# Patient Record
Sex: Female | Born: 1950 | Race: Black or African American | Hispanic: No | Marital: Married | Smoking: Former smoker
Health system: Southern US, Community
[De-identification: ages and names within clinical notes are randomized; demographics above are authoritative.]

## PROBLEM LIST (undated history)

## (undated) DIAGNOSIS — L4050 Arthropathic psoriasis, unspecified: Secondary | ICD-10-CM

## (undated) DIAGNOSIS — C801 Malignant (primary) neoplasm, unspecified: Secondary | ICD-10-CM

## (undated) DIAGNOSIS — C50919 Malignant neoplasm of unspecified site of unspecified female breast: Secondary | ICD-10-CM

## (undated) DIAGNOSIS — B354 Tinea corporis: Secondary | ICD-10-CM

## (undated) DIAGNOSIS — Z973 Presence of spectacles and contact lenses: Secondary | ICD-10-CM

## (undated) DIAGNOSIS — IMO0002 Reserved for concepts with insufficient information to code with codable children: Secondary | ICD-10-CM

## (undated) DIAGNOSIS — R232 Flushing: Secondary | ICD-10-CM

## (undated) DIAGNOSIS — E119 Type 2 diabetes mellitus without complications: Secondary | ICD-10-CM

## (undated) DIAGNOSIS — Z972 Presence of dental prosthetic device (complete) (partial): Secondary | ICD-10-CM

## (undated) DIAGNOSIS — IMO0001 Reserved for inherently not codable concepts without codable children: Secondary | ICD-10-CM

## (undated) DIAGNOSIS — R251 Tremor, unspecified: Secondary | ICD-10-CM

## (undated) HISTORY — DX: Flushing: R23.2

## (undated) HISTORY — DX: Reserved for concepts with insufficient information to code with codable children: IMO0002

## (undated) HISTORY — DX: Tremor, unspecified: R25.1

## (undated) HISTORY — DX: Malignant (primary) neoplasm, unspecified: C80.1

## (undated) HISTORY — DX: Reserved for inherently not codable concepts without codable children: IMO0001

## (undated) HISTORY — PX: COLONOSCOPY: SHX174

## (undated) HISTORY — DX: Malignant neoplasm of unspecified site of unspecified female breast: C50.919

## (undated) HISTORY — DX: Type 2 diabetes mellitus without complications: E11.9

## (undated) HISTORY — DX: Tinea corporis: B35.4

## (undated) HISTORY — DX: Arthropathic psoriasis, unspecified: L40.50

---

## 1999-02-26 ENCOUNTER — Other Ambulatory Visit: Admission: RE | Admit: 1999-02-26 | Discharge: 1999-02-26 | Payer: Self-pay | Admitting: Obstetrics and Gynecology

## 1999-11-12 ENCOUNTER — Other Ambulatory Visit: Admission: RE | Admit: 1999-11-12 | Discharge: 1999-11-12 | Payer: Self-pay | Admitting: Obstetrics and Gynecology

## 2000-12-16 ENCOUNTER — Encounter: Admission: RE | Admit: 2000-12-16 | Discharge: 2000-12-16 | Payer: Self-pay | Admitting: General Practice

## 2000-12-16 ENCOUNTER — Encounter: Payer: Self-pay | Admitting: General Practice

## 2000-12-21 ENCOUNTER — Emergency Department (HOSPITAL_COMMUNITY): Admission: EM | Admit: 2000-12-21 | Discharge: 2000-12-21 | Payer: Self-pay | Admitting: Emergency Medicine

## 2000-12-21 ENCOUNTER — Encounter: Payer: Self-pay | Admitting: *Deleted

## 2002-06-08 ENCOUNTER — Other Ambulatory Visit: Admission: RE | Admit: 2002-06-08 | Discharge: 2002-06-08 | Payer: Self-pay | Admitting: *Deleted

## 2005-06-26 ENCOUNTER — Encounter (INDEPENDENT_AMBULATORY_CARE_PROVIDER_SITE_OTHER): Payer: Self-pay | Admitting: *Deleted

## 2005-06-26 ENCOUNTER — Ambulatory Visit (HOSPITAL_COMMUNITY): Admission: RE | Admit: 2005-06-26 | Discharge: 2005-06-26 | Payer: Self-pay | Admitting: Gastroenterology

## 2005-06-27 ENCOUNTER — Encounter: Admission: RE | Admit: 2005-06-27 | Discharge: 2005-06-27 | Payer: Self-pay | Admitting: Family Medicine

## 2007-07-06 ENCOUNTER — Emergency Department (HOSPITAL_COMMUNITY): Admission: EM | Admit: 2007-07-06 | Discharge: 2007-07-06 | Payer: Self-pay | Admitting: *Deleted

## 2009-04-02 ENCOUNTER — Encounter: Admission: RE | Admit: 2009-04-02 | Discharge: 2009-04-02 | Payer: Self-pay | Admitting: Emergency Medicine

## 2009-04-16 ENCOUNTER — Ambulatory Visit: Payer: Self-pay | Admitting: Obstetrics and Gynecology

## 2009-04-23 ENCOUNTER — Ambulatory Visit: Payer: Self-pay | Admitting: Obstetrics and Gynecology

## 2009-05-05 ENCOUNTER — Emergency Department (HOSPITAL_COMMUNITY): Admission: EM | Admit: 2009-05-05 | Discharge: 2009-05-05 | Payer: Self-pay | Admitting: Emergency Medicine

## 2009-05-17 ENCOUNTER — Ambulatory Visit: Payer: Self-pay | Admitting: *Deleted

## 2009-07-14 ENCOUNTER — Emergency Department (HOSPITAL_COMMUNITY): Admission: EM | Admit: 2009-07-14 | Discharge: 2009-07-14 | Payer: Self-pay | Admitting: Emergency Medicine

## 2010-05-29 ENCOUNTER — Ambulatory Visit: Payer: Self-pay | Admitting: Obstetrics and Gynecology

## 2011-03-16 LAB — BASIC METABOLIC PANEL
BUN: 13 mg/dL (ref 6–23)
CO2: 24 mEq/L (ref 19–32)
Calcium: 9.3 mg/dL (ref 8.4–10.5)
Chloride: 105 mEq/L (ref 96–112)
Creatinine, Ser: 0.59 mg/dL (ref 0.4–1.2)
GFR calc Af Amer: >60 mL/min (ref >60)
Glucose, Bld: 327 mg/dL — ABNORMAL HIGH (ref 70–99)

## 2011-03-18 LAB — BASIC METABOLIC PANEL
BUN: 10 mg/dL (ref 6–23)
CO2: 26 mEq/L (ref 19–32)
Chloride: 108 mEq/L (ref 96–112)
Creatinine, Ser: 0.54 mg/dL (ref 0.4–1.2)
Potassium: 3.7 mEq/L (ref 3.5–5.1)

## 2011-03-18 LAB — BASIC METABOLIC PANEL WITH GFR
Calcium: 9.2 mg/dL (ref 8.4–10.5)
GFR calc Af Amer: >60 mL/min (ref >60)
GFR calc non Af Amer: >60 mL/min (ref >60)
Glucose, Bld: 268 mg/dL — ABNORMAL HIGH (ref 70–99)
Sodium: 141 meq/L (ref 135–145)

## 2011-09-22 LAB — CBC
HCT: 39.5
Hemoglobin: 12.9
MCHC: 32.8
Platelets: 263
RDW: 12.7

## 2011-09-22 LAB — I-STAT 8, (EC8 V) (CONVERTED LAB)
Acid-Base Excess: 2
Bicarbonate: 26.8 — ABNORMAL HIGH
Glucose, Bld: 289 — ABNORMAL HIGH
Hemoglobin: 14.6
Potassium: 4
Sodium: 138
TCO2: 28
pH, Ven: 7.416 — ABNORMAL HIGH

## 2011-09-22 LAB — DIFFERENTIAL
Basophils Absolute: 0.1
Basophils Relative: 1
Eosinophils Relative: 1
Lymphocytes Relative: 25
Monocytes Absolute: 0.5

## 2011-09-22 LAB — POCT CARDIAC MARKERS: Troponin i, poc: <0.05

## 2011-09-22 LAB — POCT I-STAT CREATININE
Creatinine, Ser: 0.7
Operator id: 285491

## 2011-09-22 LAB — D-DIMER, QUANTITATIVE: D-Dimer, Quant: 1.32 — ABNORMAL HIGH

## 2011-10-24 ENCOUNTER — Other Ambulatory Visit: Payer: Self-pay | Admitting: Orthopedic Surgery

## 2011-10-29 ENCOUNTER — Encounter (HOSPITAL_BASED_OUTPATIENT_CLINIC_OR_DEPARTMENT_OTHER): Admission: RE | Payer: Self-pay | Source: Ambulatory Visit

## 2011-10-29 ENCOUNTER — Ambulatory Visit (HOSPITAL_BASED_OUTPATIENT_CLINIC_OR_DEPARTMENT_OTHER)
Admission: RE | Admit: 2011-10-29 | Payer: BC Managed Care – PPO | Source: Ambulatory Visit | Admitting: Orthopedic Surgery

## 2011-10-29 SURGERY — TENDON REPAIR
Anesthesia: Choice | Laterality: Right

## 2014-01-23 ENCOUNTER — Other Ambulatory Visit: Payer: Self-pay

## 2014-01-23 DIAGNOSIS — Z1231 Encounter for screening mammogram for malignant neoplasm of breast: Secondary | ICD-10-CM

## 2014-02-03 ENCOUNTER — Ambulatory Visit: Payer: BC Managed Care – PPO

## 2014-02-03 ENCOUNTER — Other Ambulatory Visit: Payer: Self-pay | Admitting: Family Medicine

## 2014-02-03 DIAGNOSIS — N63 Unspecified lump in unspecified breast: Secondary | ICD-10-CM

## 2014-02-22 ENCOUNTER — Other Ambulatory Visit: Payer: Self-pay | Admitting: Family Medicine

## 2014-02-22 ENCOUNTER — Ambulatory Visit
Admission: RE | Admit: 2014-02-22 | Discharge: 2014-02-22 | Disposition: A | Discharge status: Home / Self Care | Payer: BC Managed Care – PPO | Source: Ambulatory Visit | Attending: Family Medicine | Admitting: Family Medicine

## 2014-02-22 DIAGNOSIS — N6310 Unspecified lump in the right breast, unspecified quadrant: Secondary | ICD-10-CM

## 2014-02-22 DIAGNOSIS — N63 Unspecified lump in unspecified breast: Secondary | ICD-10-CM

## 2014-02-28 ENCOUNTER — Other Ambulatory Visit: Payer: Self-pay | Admitting: Family Medicine

## 2014-02-28 DIAGNOSIS — D0510 Intraductal carcinoma in situ of unspecified breast: Secondary | ICD-10-CM

## 2014-03-01 ENCOUNTER — Telehealth: Payer: Self-pay | Admitting: *Deleted

## 2014-03-01 NOTE — Telephone Encounter (Signed)
Left vm for pt to return call to schedule appt for [facility] on 03/08/14.

## 2014-03-03 ENCOUNTER — Ambulatory Visit
Admission: RE | Admit: 2014-03-03 | Discharge: 2014-03-03 | Disposition: A | Discharge status: Home / Self Care | Payer: BC Managed Care – PPO | Source: Ambulatory Visit | Attending: Family Medicine | Admitting: Family Medicine

## 2014-03-03 ENCOUNTER — Telehealth: Payer: Self-pay | Admitting: *Deleted

## 2014-03-03 DIAGNOSIS — C50411 Malignant neoplasm of upper-outer quadrant of right female breast: Secondary | ICD-10-CM | POA: Insufficient documentation

## 2014-03-03 DIAGNOSIS — D0510 Intraductal carcinoma in situ of unspecified breast: Secondary | ICD-10-CM

## 2014-03-03 HISTORY — DX: Malignant neoplasm of upper-outer quadrant of right female breast: C50.411

## 2014-03-03 MED ORDER — GADOBENATE DIMEGLUMINE 529 MG/ML IV SOLN
18.0000 mL | Freq: Once | INTRAVENOUS | Status: AC | PRN
  Administered 2014-03-03: 18 mL via INTRAVENOUS

## 2014-03-03 NOTE — Telephone Encounter (Signed)
Confirmed [facility] for 03/08/14 at 1200 .  Instructions and contact information given.

## 2014-03-08 ENCOUNTER — Ambulatory Visit (HOSPITAL_BASED_OUTPATIENT_CLINIC_OR_DEPARTMENT_OTHER): Payer: BC Managed Care – PPO | Admitting: Surgery

## 2014-03-08 ENCOUNTER — Encounter: Payer: Self-pay | Admitting: Oncology

## 2014-03-08 ENCOUNTER — Other Ambulatory Visit (HOSPITAL_BASED_OUTPATIENT_CLINIC_OR_DEPARTMENT_OTHER): Payer: BC Managed Care – PPO

## 2014-03-08 ENCOUNTER — Ambulatory Visit: Payer: BC Managed Care – PPO | Attending: Surgery | Admitting: Physical Therapy

## 2014-03-08 ENCOUNTER — Ambulatory Visit: Payer: BC Managed Care – PPO

## 2014-03-08 ENCOUNTER — Encounter (INDEPENDENT_AMBULATORY_CARE_PROVIDER_SITE_OTHER): Payer: Self-pay | Admitting: Surgery

## 2014-03-08 ENCOUNTER — Ambulatory Visit (HOSPITAL_BASED_OUTPATIENT_CLINIC_OR_DEPARTMENT_OTHER): Payer: BC Managed Care – PPO | Admitting: Oncology

## 2014-03-08 ENCOUNTER — Ambulatory Visit
Admission: RE | Admit: 2014-03-08 | Discharge: 2014-03-08 | Disposition: A | Discharge status: Home / Self Care | Payer: BC Managed Care – PPO | Source: Ambulatory Visit | Attending: Radiation Oncology | Admitting: Radiation Oncology

## 2014-03-08 VITALS — BP 143/81 | HR 81 | Temp 98.5°F | Resp 20 | Ht 67.0 in | Wt 205.3 lb

## 2014-03-08 DIAGNOSIS — C50411 Malignant neoplasm of upper-outer quadrant of right female breast: Secondary | ICD-10-CM

## 2014-03-08 DIAGNOSIS — C50919 Malignant neoplasm of unspecified site of unspecified female breast: Secondary | ICD-10-CM | POA: Insufficient documentation

## 2014-03-08 DIAGNOSIS — C50419 Malignant neoplasm of upper-outer quadrant of unspecified female breast: Secondary | ICD-10-CM

## 2014-03-08 DIAGNOSIS — IMO0001 Reserved for inherently not codable concepts without codable children: Secondary | ICD-10-CM | POA: Insufficient documentation

## 2014-03-08 DIAGNOSIS — C50911 Malignant neoplasm of unspecified site of right female breast: Secondary | ICD-10-CM | POA: Insufficient documentation

## 2014-03-08 DIAGNOSIS — R293 Abnormal posture: Secondary | ICD-10-CM | POA: Insufficient documentation

## 2014-03-08 DIAGNOSIS — Z170 Estrogen receptor positive status [ER+]: Secondary | ICD-10-CM

## 2014-03-08 LAB — CBC WITH DIFFERENTIAL/PLATELET
BASO%: 0.6 % (ref 0.0–2.0)
BASOS ABS: 0 10*3/uL (ref 0.0–0.1)
EOS ABS: 0 10*3/uL (ref 0.0–0.5)
EOS%: 0.1 % (ref 0.0–7.0)
HCT: 38.1 % (ref 34.8–46.6)
HEMOGLOBIN: 12.1 g/dL (ref 11.6–15.9)
LYMPH%: 22.5 % (ref 14.0–49.7)
MCH: 26 pg (ref 25.1–34.0)
MCHC: 31.8 g/dL (ref 31.5–36.0)
MCV: 81.6 fL (ref 79.5–101.0)
MONO#: 0.3 10*3/uL (ref 0.1–0.9)
MONO%: 5.3 % (ref 0.0–14.0)
NEUT#: 4.7 10*3/uL (ref 1.5–6.5)
NEUT%: 71.5 % (ref 38.4–76.8)
PLATELETS: 248 10*3/uL (ref 145–400)
RBC: 4.67 10*6/uL (ref 3.70–5.45)
RDW: 14.3 % (ref 11.2–14.5)
WBC: 6.5 10*3/uL (ref 3.9–10.3)
lymph#: 1.5 10*3/uL (ref 0.9–3.3)

## 2014-03-08 LAB — COMPREHENSIVE METABOLIC PANEL (CC13)
ALBUMIN: 4 g/dL (ref 3.5–5.0)
ALT: 18 U/L (ref 0–55)
ANION GAP: 10 meq/L (ref 3–11)
AST: 19 U/L (ref 5–34)
Alkaline Phosphatase: 71 U/L (ref 40–150)
BUN: 16.2 mg/dL (ref 7.0–26.0)
CALCIUM: 9.5 mg/dL (ref 8.4–10.4)
CO2: 25 meq/L (ref 22–29)
Chloride: 109 mEq/L (ref 98–109)
Creatinine: 0.7 mg/dL (ref 0.6–1.1)
Glucose: 177 mg/dl — ABNORMAL HIGH (ref 70–140)
POTASSIUM: 4.1 meq/L (ref 3.5–5.1)
SODIUM: 144 meq/L (ref 136–145)
TOTAL PROTEIN: 7.6 g/dL (ref 6.4–8.3)
Total Bilirubin: 0.22 mg/dL (ref 0.20–1.20)

## 2014-03-08 NOTE — Progress Notes (Signed)
Pt uses [facility] on [address]

## 2014-03-08 NOTE — Patient Instructions (Signed)
Sentinel Lymph Node Biopsy Sentinel lymph node biopsy is a procedure in which a single lymph node is identified, removed, and examined for cancer. Lymph nodes are collections of tissue that help filter infections, cancer cells, and other waste substances from the bloodstream. Certain types of cancer can spread to nearby lymph nodes. The cancer spreads to one lymph node first, and then to others. The first lymph node that your cancer could spread to is called the sentinel lymph node. Examining the sentinel lymph node for cancer can help your caregiver plan future treatment for you. LET YOUR CAREGIVER KNOW ABOUT:   Allergies to food or medicine.  Medicines taken, including vitamins, herbs, eyedrops, over-the-counter medicines, and creams.  Use of steroids (by mouth or creams).  Previous problems with numbing medicines.  History of bleeding problems or blood clots.  Previous surgery.  Other health problems, including diabetes and kidney problems.  Possibility of pregnancy, if this applies. RISKS AND COMPLICATIONS   Infection.  Bleeding.  Allergic reaction to the dye used for the procedure.  Blue staining of the skin where the dye is injected.  Damaged lymph vessels, causing a buildup of fluid (lymphedema).  Pain or bruising at the biopsy site. BEFORE THE PROCEDURE   Stop smoking at least 2 weeks before the procedure. Not smoking will improve your health after the procedure and decrease the chance of getting a wound infection.  You may have blood tests to make sure your blood clots normally.  Ask your caregiver about changing or stopping your regular medicines.  Do not eat or drink anything for 8 hours before the procedure. PROCEDURE   You will be given medicine that makes you sleep (general anesthetic).  A blue, radioactive dye will be injected near the tumor. The dye will then spread into the sentinel lymph node.  A scanner will identify the sentinel lymph node.  A  small cut (incision) will be made, and the sentinel lymph node will be removed.  The sentinel lymph node will be examined in a lab. Sometimes, a sentinel lymph node biopsy is performed during another surgery, such as a mastectomy or lumpectomy for breast cancer.  AFTER THE PROCEDURE   You will go to a recovery room.  You will be monitored for several hours.  If complications do not occur, you will be allowed to go home a few hours after the procedure.  Your urine may be blue for the next 24 hours. This is normal. It is caused by the dye used during the procedure.  Your skin where the dye was injected may be blue for up to 8 weeks. Document Released: 02/16/2012 Document Reviewed: 02/16/2012 [facility] Patient Information 2014 [place]. Implanted Port Insertion An implanted port is a central line that has a round shape and is placed under the skin. It is used as a long-term IV access for:   Medicines, such as chemotherapy.   Fluids.   Liquid nutrition, such as total parenteral nutrition (TPN).   Blood samples.  LET [facility] CARE PROVIDER KNOW ABOUT:  Allergies to food or medicine.   Medicines taken, including vitamins, herbs, eye drops, creams, and over-the-counter medicines.   Any allergies to heparin.  Use of steroids (by mouth or creams).   Previous problems with anesthetics or numbing medicines.   History of bleeding problems or blood clots.   Previous surgery.   Other health problems, including diabetes and kidney problems.   Possibility of pregnancy, if this applies. RISKS AND COMPLICATIONS  Damage  to the blood vessel, bruising, or bleeding at the puncture site.   Infection.  Blood clot in the vessel that the port is in.  Breakdown of the skin over your port.  Very rarely a person may develop a condition called a pneumothorax, a collection of air in the chest that may cause one of the lungs to collapse. The placement of these catheters  with the appropriate imaging guidance significantly decreases the risk of a pneumothorax.  BEFORE THE PROCEDURE   Your health care provider may want you to have blood tests. These tests can help tell how well your kidneys and liver are working. They can also show how well your blood clots.   If you take blood thinners (anticoagulant medicines), ask your health care provider when you should stop taking them.   Make arrangements for someone to drive you home. This is necessary if you have been sedated for your procedure.  PROCEDURE  Port insertion usually takes about 30 45 minutes.   An IV needle will be inserted in your arm. Medicine for pain and medicine to help relax you (sedative) will flow directly into your body through this needle.   You will lie on an exam table, and you will be connected to monitors to keep track of your heart rate, blood pressure, and breathing throughout the procedure.  An oxygen monitoring device may be attached to your finger. Oxygen will be given.   Everything is kept as germ free (sterile) as possible during the procedure. The skin near the point of the incision will be cleaned with antiseptic, and the area will be draped with sterile towels. The skin and deeper tissues over the port area will be made numb with a local anesthetic.  Two small cuts (incisions) will be made in the skin to insert the port. One will be made in the neck to obtain access to the vein where the catheter will lie.   Because the port reservoir is placed under the skin, a small skin incision is made in the upper chest, and a small pocket for the port is made under the skin. The catheter connected to the port tunnels to a large central vein in the chest. A small, raised area remains on your body at the site of the reservoir when the procedure is complete.  The port placement is done under imaging guidance to ensure the proper placement.  The reservoir has a silicone covering that  can be punctured with a special needle.   The port is flushed with normal saline, and blood is drawn to make sure it is working properly.  There is nothing remaining outside the skin when the procedure is finished.   Incisions are held together by stitches, surgical glue, or a special tape.  AFTER THE PROCEDURE  You will stay in a recovery area until the anesthesia has worn off. Your blood pressure and pulse will be checked.  A final chest X-ray is taken to check the placement of the port and that there is no injury to your lung.  If there are no problems, you should be able to go home after the procedure.  Document Released: 09/14/2013 Document Reviewed: 08/01/2013 [facility] Patient Information 2014 [place], [place]. Lumpectomy A lumpectomy is a form of "breast conserving" or "breast preservation" surgery. It may also be referred to as a partial mastectomy. During a lumpectomy, the portion of the breast that contains the cancerous tumor or breast mass (the lump) is removed. Some normal tissue around  the lump may also be removed to make sure all the tumor has been removed. This surgery should take 40 minutes or less. LET [facility] CARE PROVIDER KNOW ABOUT:  Any allergies you have.  All medicines you are taking, including vitamins, herbs, eye drops, creams, and over-the-counter medicines.  Previous problems you or members of your family have had with the use of anesthetics.  Any blood disorders you have.  Previous surgeries you have had.  Medical conditions you have. RISKS AND COMPLICATIONS Generally, this is a safe procedure. However, as with any procedure, complications can occur. Possible complications include:  Bleeding.  Infection.  Pain.  Temporary swelling.  Change in the shape of the breast, particularly if a large portion is removed. BEFORE THE PROCEDURE  Ask your health care provider about changing or stopping your regular medicines.  Do not eat or drink  anything for 7 8 hours before the surgery or as directed by your health care provider. Ask your health care provider if you can take a sip of water with any approved medicines.  On the day of surgery, your healthcare provider will use a mammogram or ultrasound to locate and mark the tumor in your breast. These markings on your breast will show where the cut (incision) will be made. PROCEDURE   An IV tube will be put into one of your veins.  You may be given medicine to help you relax before the surgery (sedative). You will be given one of the following:  A medicine that numbs the area (local anesthesia).  A medicine that makes you go to sleep (general anesthesia).  Your health care provider will use a kind of electric scalpel that uses heat to minimize bleeding (electrocautery knife).  A curved incision (like a smile or frown) that follows the natural curve of your breast is made, to allow for minimal scarring and better healing.  The tumor will be removed with some of the surrounding tissue. This will be sent to the lab for analysis. Your health care provider may also remove your lymph nodes at this time if needed.  Sometimes, but not always, a rubber tube called a drain will be surgically inserted into your breast area or armpit to collect excess fluid that may accumulate in the space where the tumor was. This drain is connected to a plastic bulb on the outside of your body. This drain creates suction to help remove the fluid.  The incisions will be closed with stitches (sutures).  A bandage may be placed over the incisions. AFTER THE PROCEDURE  You will be taken to the recovery area.  You will be given medicine for pain.  A small rubber drain may be placed in the breast for 2 3 days to prevent a collection of blood (hematoma) from developing in the breast. You will be given instructions on caring for the drain before you go home.  A pressure bandage (dressing) will be applied for 1  2 days to prevent bleeding. Ask your health care provider how to care for your bandage at home. Document Released: 01/05/2007 Document Revised: 07/27/2013 Document Reviewed: 04/29/2013 [facility] Patient Information 2014 [place].

## 2014-03-08 NOTE — Progress Notes (Signed)
Radiation Oncology         [phone]) [phone] ________________________________  Initial outpatient Consultation - Date: 03/08/2014   Name: Brandy Jackson MRN: [id number]   DOB: 09-Sep-1951  REFERRING PHYSICIAN: Turner Daniels., MD  STAGE: Breast cancer of upper-outer quadrant of right female breast   Primary site: Breast (Right)   Staging method: AJCC 7th Edition   Clinical: Stage IIA (T2, N0, cM0)   Summary: Stage IIA (T2, N0, cM0)   Clinical comments: Staged at breast conference 03/08/14.   HISTORY OF PRESENT ILLNESS::Brandy Jackson is a 63 y.o. female  who presented with a palpable right breast mass. It evaluation showed a mass in the upper outer quadrant. This measured 2.1 x 2.0 x 1.7 cm. 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 Ki-67 was greater than 90%. MRI confirmed this to be a solitary finding. She presents today accompanied by her husband for consideration of radiation in the management of her newly diagnosed breast cancer.  She is accompanied by her husband. She is exp 3 with her first live birth at 22. Her menses started at 43 and her last period was in 2002. She is postmenopausal and has not taken hormone replacement.  PAST MEDICAL HISTORY:  has a past medical history of Diabetes mellitus without complication; Arthritis; and Hot flashes.    PAST SURGICAL HISTORY: Past Surgical History  Procedure Laterality Date  . Bladder repair w/ cesarean section      FAMILY HISTORY: No family history on file.  SOCIAL HISTORY:  History  Substance Use Topics  . Smoking status: Former Smoker -- 1.00 packs/day    Quit date: 03/08/1986  . Smokeless tobacco: Not on file  . Alcohol Use: No    ALLERGIES: Review of patient's allergies indicates no known allergies.  MEDICATIONS:  Current Outpatient Prescriptions  Medication Sig Dispense Refill  . metFORMIN (GLUCOPHAGE) 500 MG tablet Take 500 mg by mouth 2 (two) times  daily with a meal.      . naproxen (NAPROSYN) 250 MG tablet Take 250 mg by mouth as needed.       No current facility-administered medications for this encounter.    REVIEW OF SYSTEMS:  A 15 point review of systems is documented in the electronic medical record. This was obtained by the nursing staff. However, I reviewed this with the patient to discuss relevant findings and make appropriate changes.  A comprehensive review of systems was negative.  PHYSICAL EXAM: There were no vitals filed for this visit.. . He is a pleasant female in no distress sitting comfortably examining table. She has a palpable right breast mass in the upper outer quadrant. No palpable axillary adenopathy. No palpable cervical or subclavicular adenopathy.  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 Pre-treatment MRI.  LABS:  BUN and creatinine were obtained on site at [facility] at [address]  Results:  BUN 14 mg/dL, Creatinine 0.5 mg/dL, estimated GFR 121.  EXAM: BILATERAL BREAST MRI WITH AND WITHOUT CONTRAST  TECHNIQUE: Multiplanar, multisequence MR images of both breasts were obtained prior to and following the intravenous administration of 18 ml of MultiHance.  THREE-DIMENSIONAL MR IMAGE RENDERING ON INDEPENDENT WORKSTATION:  Three-dimensional MR images were rendered by post-processing of the original MR data on an independent workstation. The three-dimensional MR images were interpreted, and  findings are reported in the following complete MRI report for this study. Three dimensional images were evaluated at the independent DynaCad workstation  COMPARISON:  No prior MRI. Mammography 02/22/2014. Right breast ultrasound 02/22/2014.  FINDINGS: Breast composition: a.  Almost entirely fat.  Background parenchymal enhancement: Minimal.  Right breast: Enhancing approximate 1.7 x 2.5 x 2.4 cm mass with irregular margins in the outer and slight upper right breast, junction of middle and posterior 1/3, consistent with biopsy-proven IDC and DCIS, demonstrating washout and plateau kinetics. Biopsy clip artifact is present within the mass. No other mass or abnormal enhancement elsewhere in the right breast.  Left breast: No mass or abnormal enhancement.  Lymph nodes: No abnormal appearing lymph nodes.  Ancillary findings:  None.  IMPRESSION: 1. 2.5 cm mass in the outer and slight upper right breast consistent with the biopsy-proven IDC and DCIS. 2. No evidence of malignancy elsewhere in either breast. 3. No pathologic lymphadenopathy.  RECOMMENDATION: Treatment plan.  BI-RADS CATEGORY  6: Known biopsy-proven malignancy - appropriate action should be taken.   Electronically Signed   By: Evangeline Dakin M.D.   On: 03/03/2014 12:09   Mm Digital Diagnostic Bilat  02/22/2014   CLINICAL DATA:  Patient presents for evaluation palpable abnormality right breast. Patient states this has been present for the past 2 weeks.  EXAM: DIGITAL DIAGNOSTIC  BILATERAL MAMMOGRAM WITH CAD  ULTRASOUND RIGHT BREAST  COMPARISON:  [facility] dating back to 04/02/2009.  ACR Breast Density Category b: There are scattered areas of fibroglandular density.  FINDINGS: Within the upper-outer quadrant of the right breast underlying the palpable marker there is a 2 cm irregular spiculated mass better demonstrated on spot tangential view. No additional concerning masses, calcifications or architectural distortion identified in either breast.   Mammographic images were processed with CAD.  On physical exam, I palpate a small mobile mass within the upper-outer quadrant of the right breast.  Ultrasound is performed, showing a 2.1 x 2.0 x 1.7 cm irregular taller than wide hypoechoic mass with peripheral echogenicity in the right breast 10 o'clock position 7 cm from the nipple of the site of palpable abnormality. No right axillary adenopathy identified.  IMPRESSION: Suspicious right breast mass at the site of palpable abnormality.  RECOMMENDATION: Right breast ultrasound-guided core needle biopsy (this is tentatively scheduled for today, February 22, 2014 at 3 p.m.).  I have discussed the findings and recommendations with the patient. Results were also provided in writing at the conclusion of the visit. If applicable, a reminder letter will be sent to the patient regarding the next appointment.  BI-RADS CATEGORY  5: Highly suggestive of malignancy - appropriate action should be taken.   Electronically Signed   By: Lovey Newcomer M.D.   On: 02/22/2014 09:42   Mm Digital Diagnostic Unilat R  02/22/2014   CLINICAL DATA:  Status post ultrasound-guided core needle biopsy suspicious right breast mass.  EXAM: DIAGNOSTIC RIGHT MAMMOGRAM POST ULTRASOUND BIOPSY  COMPARISON:  Previous exams  FINDINGS: Mammographic images were obtained following ultrasound guided biopsy of suspicious right breast mass 10 o'clock position. Ribbon shaped biopsy marking clip in appropriate position.  IMPRESSION: Appropriate position biopsy marking clip status post ultrasound-guided core needle biopsy right breast mass.  Final Assessment: Post Procedure Mammograms for Marker Placement   Electronically Signed   By: Lovey Newcomer M.D.   On: 02/22/2014 16:28   US Breast Ltd Uni Right Inc Axilla  02/22/2014   CLINICAL DATA:  Patient presents for evaluation palpable abnormality right breast. Patient states this has been present for the past 2 weeks.  EXAM: DIGITAL DIAGNOSTIC  BILATERAL MAMMOGRAM  WITH CAD  ULTRASOUND RIGHT BREAST  COMPARISON:  [facility] dating back to 04/02/2009.  ACR Breast Density Category b: There are scattered areas of fibroglandular density.  FINDINGS: Within the upper-outer quadrant of the right breast underlying the palpable marker there is a 2 cm irregular spiculated mass better demonstrated on spot tangential view. No additional concerning masses, calcifications or architectural distortion identified in either breast.  Mammographic images were processed with CAD.  On physical exam, I palpate a small mobile mass within the upper-outer quadrant of the right breast.  Ultrasound is performed, showing a 2.1 x 2.0 x 1.7 cm irregular taller than wide hypoechoic mass with peripheral echogenicity in the right breast 10 o'clock position 7 cm from the nipple of the site of palpable abnormality. No right axillary adenopathy identified.  IMPRESSION: Suspicious right breast mass at the site of palpable abnormality.  RECOMMENDATION: Right breast ultrasound-guided core needle biopsy (this is tentatively scheduled for today, February 22, 2014 at 3 p.m.).  I have discussed the findings and recommendations with the patient. Results were also provided in writing at the conclusion of the visit. If applicable, a reminder letter will be sent to the patient regarding the next appointment.  BI-RADS CATEGORY  5: Highly suggestive of malignancy - appropriate action should be taken.   Electronically Signed   By: Lovey Newcomer M.D.   On: 02/22/2014 09:42   [place] Rt Breast Bx W Loc Dev 1st Lesion Img Bx Spec US Guide  02/23/2014   ADDENDUM REPORT: 02/23/2014 18:03  ADDENDUM: I spoke with the patient on February 23, 2014 to relay pathology results from recent ultrasound-guided core needle right breast biopsy. 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 This is concordant with imaging findings.  Recommendation is for surgical consultation. Patient's physician Dr. Alyson Ingles will take care of the  surgical referral.  Is recommended for the patient to receive a preoperative breast MRI.  Patient stated no complaints at the biopsy site.  Patient was instructed to call the breast center with any additional questions or concerns.   Electronically Signed   By: Lovey Newcomer M.D.   On: 02/23/2014 18:03   02/23/2014   CLINICAL DATA:  Suspicious right breast mass.  EXAM: ULTRASOUND GUIDED RIGHT BREAST CORE NEEDLE BIOPSY WITH VACUUM ASSIST  COMPARISON:  Previous exams.  PROCEDURE: I met with the patient and we discussed the procedure of ultrasound-guided biopsy, including benefits and alternatives. We discussed the high likelihood of a successful procedure. We discussed the risks of the procedure including infection, bleeding, tissue injury, clip migration, and inadequate sampling. Informed written consent was given. The usual time-out protocol was performed immediately prior to the procedure.  Using sterile technique and 2% Lidocaine as local anesthetic, under direct ultrasound visualization, a 12 gauge vacuum-assisteddevice was used to perform biopsy of suspicious hypoechoic right breast mass 10  o'clock position using a lateral approach. At the conclusion of the procedure, a ribbon shaped tissue marker clip was deployed into the biopsy cavity. Follow-up 2-view mammogram was performed and dictated separately.  IMPRESSION: Ultrasound-guided biopsy of suspicious right breast mass. No apparent complications.  Electronically Signed: By: Lovey Newcomer M.D. On: 02/22/2014 16:27      IMPRESSION: T2 N0 invasive ductal carcinoma of the right breast  PLAN: I spoke with Ms. Pincus about the role of radiation in decreasing local failures after lumpectomy and chemotherapy. We discussed that radiation is performed on a daily basis for 6 weeks. We discussed the process of simulation the treatment of tattoos. We discussed skin redness and fatigue as common side effects. We discussed the low rate of secondary malignancies. I will  see her back after chemotherapy is complete.   I spent 30 minutes  face to face with the patient and more than 50% of that time was spent in counseling and/or coordination of care.   ------------------------------------------------  Thea Silversmith, MD

## 2014-03-08 NOTE — Progress Notes (Signed)
Patient ID: Brandy Jackson, female   DOB: [birth year], 63 y.o.   MRN: [id number]  No chief complaint on file.   HPI Brandy Jackson is a 63 y.o. female.  Pt presents at the request of Dr Titus Mould for right breast cancer. Pt noticed a lump 1 month ago.  Mammogram,  MRI and biopsy show 2.5 cm IDC upper outer quadrant ER/PR/HER 2 NEU positive.  Sore from biopsy but otherwise ok.   HPI  Past Medical History  Diagnosis Date  . Diabetes mellitus without complication   . Arthritis   . Hot flashes     Past Surgical History  Procedure Laterality Date  . Bladder repair w/ cesarean section      History reviewed. No pertinent family history.  Social History History  Substance Use Topics  . Smoking status: Former Smoker -- 1.00 packs/day    Quit date: 03/08/1986  . Smokeless tobacco: Not on file  . Alcohol Use: No    No Known Allergies  Current Outpatient Prescriptions  Medication Sig Dispense Refill  . metFORMIN (GLUCOPHAGE) 500 MG tablet Take 500 mg by mouth 2 (two) times daily with a meal.      . naproxen (NAPROSYN) 250 MG tablet Take 250 mg by mouth as needed.       No current facility-administered medications for this visit.    Review of Systems Review of Systems  Constitutional: Negative for fever, chills and unexpected weight change.  HENT: Negative for congestion, hearing loss, sore throat, trouble swallowing and voice change.   Eyes: Negative for visual disturbance.  Respiratory: Negative for cough and wheezing.   Cardiovascular: Negative for chest pain, palpitations and leg swelling.  Gastrointestinal: Negative for nausea, vomiting, abdominal pain, diarrhea, constipation, blood in stool, abdominal distention and anal bleeding.  Genitourinary: Negative for hematuria, vaginal bleeding and difficulty urinating.  Musculoskeletal: Negative for arthralgias.  Skin: Negative for rash and wound.  Neurological: Negative for seizures, syncope and headaches.  Hematological:  Negative for adenopathy. Does not bruise/bleed easily.  Psychiatric/Behavioral: Negative for confusion.    There were no vitals taken for this visit.  Physical Exam Physical Exam  Constitutional: She is oriented to person, place, and time. She appears well-developed and well-nourished.  HENT:  Head: Normocephalic and atraumatic.  Eyes: Pupils are equal, round, and reactive to light. No scleral icterus.  Neck: Normal range of motion.  Cardiovascular: Normal rate and regular rhythm.   Pulmonary/Chest: Right breast exhibits mass, skin change and tenderness. Right breast exhibits no inverted nipple and no nipple discharge. Left breast exhibits no inverted nipple, no mass, no nipple discharge, no skin change and no tenderness. Breasts are symmetrical.    Musculoskeletal: Normal range of motion.  Lymphadenopathy:    She has no cervical adenopathy.  Neurological: She is alert and oriented to person, place, and time.  Skin: Skin is warm and dry.  Psychiatric: She has a normal mood and affect. Her behavior is normal. Judgment and thought content normal.    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 Pre-treatment MRI.  LABS: BUN and creatinine were obtained on site at [facility] at [address]  Results: BUN 14 mg/dL, Creatinine 0.5 mg/dL, estimated GFR 121.  EXAM:  BILATERAL BREAST MRI WITH AND WITHOUT CONTRAST  TECHNIQUE:  Multiplanar, multisequence MR images of both breasts were obtained  prior to and following the intravenous administration of 18 ml of  MultiHance.  THREE-DIMENSIONAL MR IMAGE RENDERING ON INDEPENDENT WORKSTATION:  Three-dimensional MR images were rendered by post-processing of the  original MR data on an independent workstation. The  three-dimensional MR images were interpreted, and findings are  reported in the following complete MRI report for this study. Three    dimensional images were evaluated at the independent DynaCad  workstation  COMPARISON: No prior MRI. Mammography 02/22/2014. Right breast  ultrasound 02/22/2014.  FINDINGS:  Breast composition: a. Almost entirely fat.  Background parenchymal enhancement: Minimal.  Right breast: Enhancing approximate 1.7 x 2.5 x 2.4 cm mass with  irregular margins in the outer and slight upper right breast,  junction of middle and posterior 1/3, consistent with biopsy-proven  IDC and DCIS, demonstrating washout and plateau kinetics. Biopsy  clip artifact is present within the mass. No other mass or abnormal  enhancement elsewhere in the right breast.  Left breast: No mass or abnormal enhancement.  Lymph nodes: No abnormal appearing lymph nodes.  Ancillary findings: None.  IMPRESSION:  1. 2.5 cm mass in the outer and slight upper right breast consistent  with the biopsy-proven IDC and DCIS.  2. No evidence of malignancy elsewhere in either breast.  3. No pathologic lymphadenopathy.  RECOMMENDATION:  Treatment plan.  BI-RADS CATEGORY 6: Known biopsy-proven malignancy - appropriate  action should be taken.  Electronically Signed  By: Evangeline Dakin M.D.  On: 03/03/2014 12:09    Assessment    Stage 2 right breast cancer upper outer quadrant ER / PR/HER 2 NEU POSITIVE     Plan    Ample breast size for lumpectomy.  Will ned chemotherapy post op so port can be placed.  Discussed breast conservation and mastectomy with reconstruction.  Discussed SLN mapping.  Pt would like to conserve her breast.  Recommended right breast seed localized lumpectomy,  Right SLN mapping and port placement.  The procedure has been discussed with the patient. Alternatives to surgery have been discussed with the patient.  Risks of surgery include bleeding,  Infection,  Seroma formation, death,  and the need for further surgery.   The patient understands and wishes to proceed.Sentinel lymph node mapping and dissection has  been discussed with the patient.  Risk of bleeding,  Infection,  Seroma formation,  Additional procedures,,  Shoulder weakness ,  Shoulder stiffness,  Nerve and blood vessel injury and reaction to the mapping dyes have been discussed.  Alternatives to surgery have been discussed with the patient.  The patient agrees to proceed.Risk of port placement include bleeding,  Infection pneumothorax,  Hemothorax,  Injury to blood vessels,  Nerves and other mediastinal structures,  Death,  Migration,  Infection,  Malfunction.  She agrees to proceed.       Kemond Amorin A. 03/08/2014, 2:55 PM

## 2014-03-08 NOTE — Progress Notes (Signed)
Checked in new pt with no financial concerns. °

## 2014-03-09 NOTE — Progress Notes (Signed)
[facility] Psychosocial Distress Screening Clinical Social Work  Clinical Social Work met with Patient at breast clinic.  The patient scored a 0 on the Psychosocial Distress Thermometer which indicates no  distress. Clinical Social Worker Intern assessed for distress and other psychosocial needs. Patient was given written information on Patient and Family support services.  Patient agrees to seek out further assistance if needed.   Clinical Social Worker follow up needed: no  If yes, follow up plan:   Natlie Asfour [address] [place] Work Intern [employer] [phone]

## 2014-03-10 ENCOUNTER — Encounter: Payer: Self-pay | Admitting: Oncology

## 2014-03-10 ENCOUNTER — Telehealth: Payer: Self-pay | Admitting: *Deleted

## 2014-03-10 MED ORDER — LIDOCAINE-PRILOCAINE 2.5-2.5 % EX CREA: TOPICAL_CREAM | CUTANEOUS | Status: DC | PRN

## 2014-03-10 NOTE — Patient Instructions (Signed)
Proceed with surgery and port placement  I will see you back after surgery

## 2014-03-10 NOTE — Progress Notes (Signed)
Brandy Jackson [id number] [birth year] 63 y.o. 03/10/2014 12:21 PM  CC  Ricke Hey, MD [address] [place] [address] Dr. Thea Silversmith Dr. Marcello Moores cornett  REASON FOR CONSULTATION:  63 year old female with new diagnosis of stage II right breast cancer. Patient is seen in the multidisciplinary breast clinic for discussion of treatment options.  STAGE:   Breast cancer of upper-outer quadrant of right female breast   Primary site: Breast (Right)   Staging method: AJCC 7th Edition   Clinical: Stage IIA (T2, N0, cM0)   Summary: Stage IIA (T2, N0, cM0)  REFERRING PHYSICIAN: Dr. Marcello Moores Cornett  HISTORY OF PRESENT ILLNESS:  Brandy Jackson is a 63 y.o. female.  Patient palpated a right breast mass. She had a mammogram performed and she was noted to have a mass in the upper outer quadrant measuring 2.1 x 2.0 x 1.7 cm. Biopsy was performed that showed a grade 2-3  Invasive ductal carcinoma. Tumor was ER positive PR positive HER-2/neu positive (triple positive) with a proliferation marker Ki-67 at 90%. She had MRI of the breasts performed which showed a solitary mass in the upper outer quadrant measuring 2.5 cm. Her case was discussed at the multidisciplinary breast conference. Radiology and pathology were reviewed. She herself is without any complaints. She does have a little bit of tenderness at the biopsy site. She is accompanied by her husband today.   Past Medical History: Past Medical History  Diagnosis Date  . Diabetes mellitus without complication   . Arthritis   . Hot flashes   . Cancer   . Breast cancer     Past Surgical History: Past Surgical History  Procedure Laterality Date  . Bladder repair w/ cesarean section      Family History: History reviewed. No pertinent family history.  Social History History  Substance Use Topics  . Smoking status: Former Smoker -- 1.00 packs/day    Quit date: 03/08/1986  . Smokeless tobacco: Never Used  .  Alcohol Use: No    Allergies: No Known Allergies  Current Medications: Current Outpatient Prescriptions  Medication Sig Dispense Refill  . metFORMIN (GLUCOPHAGE) 500 MG tablet Take 500 mg by mouth 2 (two) times daily with a meal.      . naproxen (NAPROSYN) 250 MG tablet Take 250 mg by mouth as needed.       No current facility-administered medications for this visit.    OB/GYN History: Menarche at age 47, menopause 2002, no hormone replacement therapy, first live birth at 40.  Fertility Discussion: n/a Prior History of Cancer: no  Health Maintenance:  Colonoscopy 2010 Bone Density no Last PAP smear 2000  ECOG PERFORMANCE STATUS: 1 - Symptomatic but completely ambulatory  Genetic Counseling/testing: no  REVIEW OF SYSTEMS:  A comprehensive 14 point review of systems was obtained and scanned and separately into the electronic medical record. She does have psoriatic arthritis and does have significant aches and pains but sometimes difficulty walking.  PHYSICAL EXAMINATION: Blood pressure 143/81, pulse 81, temperature 98.5 F (36.9 C), temperature source Oral, resp. rate 20, height '5\' 7"'  (1.702 m), weight 205 lb 4.8 oz (93.123 kg).  General:  well-nourished in no acute distress.  Eyes:  no scleral icterus.  ENT:  There were no oropharyngeal lesions.  Neck was without thyromegaly.  Lymphatics:  Negative cervical, supraclavicular or axillary adenopathy.  Respiratory: lungs were clear bilaterally without wheezing or crackles.  Cardiovascular:  Regular rate and rhythm, S1/S2, without murmur, rub or gallop.  There  was no pedal edema.  GI:  abdomen was soft, flat, nontender, nondistended, without organomegaly.  Muscoloskeletal:  no spinal tenderness of palpation of vertebral spine.  Skin exam was without echymosis, petichae.  Neuro exam was nonfocal.  Patient was able to get on and off exam table without assistance.  Gait was normal.  Patient was alerted and oriented.  Attention was good.    Language was appropriate.  Mood was normal without depression.  Speech was not pressured.  Thought content was not tangential.   Breasts: right breast normal without mass, skin or nipple changes or axillary nodes with palpable mass in the upper outer quadrant, left breast normal without mass, skin or nipple changes or axillary nodes.   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 Pre-treatment MRI.  LABS:  BUN and creatinine were obtained on site at [facility] at [address]  Results:  BUN 14 mg/dL, Creatinine 0.5 mg/dL, estimated GFR 121.  EXAM: BILATERAL BREAST MRI WITH AND WITHOUT CONTRAST  TECHNIQUE: Multiplanar, multisequence MR images of both breasts were obtained prior to and following the intravenous administration of 18 ml of MultiHance.  THREE-DIMENSIONAL MR IMAGE RENDERING ON INDEPENDENT WORKSTATION:  Three-dimensional MR images were rendered by post-processing of the original MR data on an independent workstation. The three-dimensional MR images were interpreted, and findings are reported in the following complete MRI report for this study. Three dimensional images were evaluated at the independent DynaCad workstation  COMPARISON:  No prior MRI. Mammography 02/22/2014. Right breast ultrasound 02/22/2014.  FINDINGS: Breast composition: a.  Almost entirely fat.  Background parenchymal enhancement: Minimal.  Right breast: Enhancing approximate 1.7 x 2.5 x 2.4 cm mass with irregular margins in the outer and slight upper right breast, junction of middle and posterior 1/3, consistent with biopsy-proven IDC and DCIS, demonstrating washout and plateau kinetics. Biopsy clip artifact is present within the mass. No other mass or abnormal enhancement elsewhere in the right breast.  Left breast: No mass or abnormal enhancement.  Lymph nodes: No abnormal appearing lymph  nodes.  Ancillary findings:  None.  IMPRESSION: 1. 2.5 cm mass in the outer and slight upper right breast consistent with the biopsy-proven IDC and DCIS. 2. No evidence of malignancy elsewhere in either breast. 3. No pathologic lymphadenopathy.  RECOMMENDATION: Treatment plan.  BI-RADS CATEGORY  6: Known biopsy-proven malignancy - appropriate action should be taken.   Electronically Signed   By: Evangeline Dakin M.D.   On: 03/03/2014 12:09   Mm Digital Diagnostic Bilat  02/22/2014   CLINICAL DATA:  Patient presents for evaluation palpable abnormality right breast. Patient states this has been present for the past 2 weeks.  EXAM: DIGITAL DIAGNOSTIC  BILATERAL MAMMOGRAM WITH CAD  ULTRASOUND RIGHT BREAST  COMPARISON:  [facility] dating back to 04/02/2009.  ACR Breast Density Category b: There are scattered areas of fibroglandular density.  FINDINGS: Within the upper-outer quadrant of the right breast underlying the palpable marker there is a 2 cm irregular spiculated mass better demonstrated on spot tangential view. No additional concerning masses, calcifications or architectural distortion identified in either breast.  Mammographic images were processed with CAD.  On physical exam, I palpate a small mobile mass within the upper-outer quadrant of the right breast.  Ultrasound is performed, showing a 2.1 x 2.0 x 1.7 cm irregular taller than wide hypoechoic mass with peripheral echogenicity in the right  breast 10 o'clock position 7 cm from the nipple of the site of palpable abnormality. No right axillary adenopathy identified.  IMPRESSION: Suspicious right breast mass at the site of palpable abnormality.  RECOMMENDATION: Right breast ultrasound-guided core needle biopsy (this is tentatively scheduled for today, February 22, 2014 at 3 p.m.).  I have discussed the findings and recommendations with the patient. Results were also provided in writing at the conclusion of the visit. If applicable, a reminder letter will be sent to  the patient regarding the next appointment.  BI-RADS CATEGORY  5: Highly suggestive of malignancy - appropriate action should be taken.   Electronically Signed   By: Lovey Newcomer M.D.   On: 02/22/2014 09:42   Mm Digital Diagnostic Unilat R  02/22/2014   CLINICAL DATA:  Status post ultrasound-guided core needle biopsy suspicious right breast mass.  EXAM: DIAGNOSTIC RIGHT MAMMOGRAM POST ULTRASOUND BIOPSY  COMPARISON:  Previous exams  FINDINGS: Mammographic images were obtained following ultrasound guided biopsy of suspicious right breast mass 10 o'clock position. Ribbon shaped biopsy marking clip in appropriate position.  IMPRESSION: Appropriate position biopsy marking clip status post ultrasound-guided core needle biopsy right breast mass.  Final Assessment: Post Procedure Mammograms for Marker Placement   Electronically Signed   By: Lovey Newcomer M.D.   On: 02/22/2014 16:28   US Breast Ltd Uni Right Inc Axilla  02/22/2014   CLINICAL DATA:  Patient presents for evaluation palpable abnormality right breast. Patient states this has been present for the past 2 weeks.  EXAM: DIGITAL DIAGNOSTIC  BILATERAL MAMMOGRAM WITH CAD  ULTRASOUND RIGHT BREAST  COMPARISON:  [facility] dating back to 04/02/2009.  ACR Breast Density Category b: There are scattered areas of fibroglandular density.  FINDINGS: Within the upper-outer quadrant of the right breast underlying the palpable marker there is a 2 cm irregular spiculated mass better demonstrated on spot tangential view. No additional concerning masses, calcifications or architectural distortion identified in either breast.  Mammographic images were processed with CAD.  On physical exam, I palpate a small mobile mass within the upper-outer quadrant of the right breast.  Ultrasound is performed, showing a 2.1 x 2.0 x 1.7 cm irregular taller than wide hypoechoic mass with peripheral echogenicity in the right breast 10 o'clock position 7 cm from the nipple of the site of palpable  abnormality. No right axillary adenopathy identified.  IMPRESSION: Suspicious right breast mass at the site of palpable abnormality.  RECOMMENDATION: Right breast ultrasound-guided core needle biopsy (this is tentatively scheduled for today, February 22, 2014 at 3 p.m.).  I have discussed the findings and recommendations with the patient. Results were also provided in writing at the conclusion of the visit. If applicable, a reminder letter will be sent to the patient regarding the next appointment.  BI-RADS CATEGORY  5: Highly suggestive of malignancy - appropriate action should be taken.   Electronically Signed   By: Lovey Newcomer M.D.   On: 02/22/2014 09:42   [place] Rt Breast Bx W Loc Dev 1st Lesion Img Bx Spec US Guide  02/23/2014   ADDENDUM REPORT: 02/23/2014 18:03  ADDENDUM: I spoke with the patient on February 23, 2014 to relay pathology results from recent ultrasound-guided core needle right breast biopsy. 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 This is concordant with imaging findings.  Recommendation is for surgical consultation. Patient's physician Dr. Alyson Ingles will take care of the surgical referral.  Is recommended for the patient to receive a preoperative breast MRI.  Patient stated no  complaints at the biopsy site.  Patient was instructed to call the breast center with any additional questions or concerns.   Electronically Signed   By: Lovey Newcomer M.D.   On: 02/23/2014 18:03   02/23/2014   CLINICAL DATA:  Suspicious right breast mass.  EXAM: ULTRASOUND GUIDED RIGHT BREAST CORE NEEDLE BIOPSY WITH VACUUM ASSIST  COMPARISON:  Previous exams.  PROCEDURE: I met with the patient and we discussed the procedure of ultrasound-guided biopsy, including benefits and alternatives. We discussed the high likelihood of a successful procedure. We discussed the risks of the procedure including infection, bleeding, tissue injury, clip migration, and inadequate sampling. Informed written consent was  given. The usual time-out protocol was performed immediately prior to the procedure.  Using sterile technique and 2% Lidocaine as local anesthetic, under direct ultrasound visualization, a 12 gauge vacuum-assisteddevice was used to perform biopsy of suspicious hypoechoic right breast mass 10 o'clock position using a lateral approach. At the conclusion of the procedure, a ribbon shaped tissue marker clip was deployed into the biopsy cavity. Follow-up 2-view mammogram was performed and dictated separately.  IMPRESSION: Ultrasound-guided biopsy of suspicious right breast mass. No apparent complications.  Electronically Signed: By: Lovey Newcomer M.D. On: 02/22/2014 16:27     LABS:    Chemistry      Component Value Date/Time   NA 144 03/08/2014 1225   NA 138 07/14/2009 2110   K 4.1 03/08/2014 1225   K 3.5 07/14/2009 2110   CL 105 07/14/2009 2110   CO2 25 03/08/2014 1225   CO2 24 07/14/2009 2110   BUN 16.2 03/08/2014 1225   BUN 13 07/14/2009 2110   CREATININE 0.7 03/08/2014 1225   CREATININE 0.59 07/14/2009 2110      Component Value Date/Time   CALCIUM 9.5 03/08/2014 1225   CALCIUM 9.3 07/14/2009 2110   ALKPHOS 71 03/08/2014 1225   AST 19 03/08/2014 1225   ALT 18 03/08/2014 1225   BILITOT 0.22 03/08/2014 1225      Lab Results  Component Value Date   WBC 6.5 03/08/2014   HGB 12.1 03/08/2014   HCT 38.1 03/08/2014   MCV 81.6 03/08/2014   PLT 248 03/08/2014    ASSESSMENT/PLAN  63 year old female with  #1 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 Patient found this on self breast examination.  #2 patient is seen in the multidisciplinary breast clinic. Treatment options were discussed with her including lumpectomy with sentinel lymph node biopsy adjuvant chemotherapy and adjuvant radiation therapy.  #3 adjuvant chemotherapy: Patient and I discussed extensively her pathology, pathophysiology and staging today. We discussed the prognostic  markers which do show that she has a triple positive breast cancer in an elevated proliferation marker Ki-67 and 94%. She will need adjuvant chemotherapy. Because her tumor is HER-2 positive she would also need antiestrogen therapy. My recommendation of chemotherapy is Taxotere carboplatinum to be given every 3 weeks for a total of 6 cycles. She will also receive Herceptin on a weekly basis for duration of her chemotherapy. Once she finishes chemotherapy we will then switch the Herceptin to every 3 weeks to finish out a total of one year. Patient does have ER positive disease. We discussed antiestrogen therapy for a total of 5 years. We will try to use an aromatase inhibitor such as Arimidex. My index of suspicion is that she may eventually end up being on tamoxifen as she does have psoriatic  arthritis and she does have significant aches and pains from that. However we will try to attempt to give her an aromatase inhibitor initially. Certainly changes can be made as needed. Patient and I and her husband discussed side effects risks benefits and complications of treatment. She has agreed to proceed with surgery and then chemotherapy as needed. She will need an echocardiogram every 3 months for surveillance of heart function secondary to Herceptin. She will also need a Port-A-Cath placed, and we'll send her for a cardiology consultation. She will also need chemotherapy teaching class prior to starting her on chemotherapy. Patient demonstrated understanding of all of these logistical issues.  #4 patient will proceed with surgery first at the time of her surgery she will have her port placed. I will plan on following up with her after the surgery   Thank you so much for allowing me to participate in the care of Brandy Jackson. I will continue to follow up the patient with you and assist in her care.  All questions were answered. The patient knows to call the clinic with any problems, questions or concerns. We  can certainly see the patient much sooner if necessary.  I spent 55 minutes counseling the patient face to face. The total time spent in the appointment was 60 minutes.  Marcy Panning, MD Medical/Oncology [facility] [phone] (beeper) [phone] (Office)

## 2014-03-10 NOTE — Telephone Encounter (Signed)
Faxed Care Plan to PCP and took to HIM to scan.  

## 2014-03-13 ENCOUNTER — Telehealth: Payer: Self-pay | Admitting: Adult Health

## 2014-03-13 NOTE — Telephone Encounter (Signed)
, °

## 2014-03-14 ENCOUNTER — Other Ambulatory Visit (INDEPENDENT_AMBULATORY_CARE_PROVIDER_SITE_OTHER): Payer: Self-pay | Admitting: Surgery

## 2014-03-14 DIAGNOSIS — C50911 Malignant neoplasm of unspecified site of right female breast: Secondary | ICD-10-CM

## 2014-03-15 ENCOUNTER — Telehealth: Payer: Self-pay | Admitting: *Deleted

## 2014-03-15 ENCOUNTER — Telehealth: Payer: Self-pay | Admitting: Adult Health

## 2014-03-15 NOTE — Telephone Encounter (Signed)
Per staff message and POF I have scheduled appts. I have tried to scheduled first treatment on 5/1, unable to schedule due to MRI. Notified scheduler JMW

## 2014-03-15 NOTE — Telephone Encounter (Signed)
, °

## 2014-03-17 ENCOUNTER — Telehealth: Payer: Self-pay | Admitting: *Deleted

## 2014-03-17 ENCOUNTER — Telehealth: Payer: Self-pay | Admitting: Adult Health

## 2014-03-17 NOTE — Telephone Encounter (Signed)
Left message for a return phone call from [facility] 03/22/14.  Awaiting patient response. 

## 2014-03-17 NOTE — Telephone Encounter (Signed)
, °

## 2014-03-20 ENCOUNTER — Telehealth: Payer: Self-pay | Admitting: *Deleted

## 2014-03-20 NOTE — Telephone Encounter (Signed)
Per staff message and POF I have scheduled appts.  JMW  

## 2014-03-22 ENCOUNTER — Encounter: Payer: Self-pay | Admitting: *Deleted

## 2014-03-24 ENCOUNTER — Telehealth: Payer: Self-pay | Admitting: Oncology

## 2014-03-24 NOTE — Telephone Encounter (Signed)
Pt to get her appt calendar when she comes in for the chemo class. Sent michelle a staff message to  r/s the chemo appt on 04/28/2014

## 2014-03-27 ENCOUNTER — Encounter: Payer: Self-pay | Admitting: *Deleted

## 2014-03-27 ENCOUNTER — Telehealth: Payer: Self-pay | Admitting: Oncology

## 2014-03-27 ENCOUNTER — Telehealth: Payer: Self-pay | Admitting: *Deleted

## 2014-03-27 NOTE — Telephone Encounter (Signed)
Per staff message and POF I have scheduled appts.  JMW  

## 2014-03-27 NOTE — Telephone Encounter (Signed)
Called pt.  Discussed new treatment date on 5/22 d/t surgery not scheduled until 5/5.  Gave lab appt at 1200 and f/u with Dr. Humphrey Rolls at 1230.  Informed that chemo will start after appt with Dr. Humphrey Rolls.  Received verbal understanding.  No further concerns voiced.

## 2014-03-27 NOTE — Telephone Encounter (Signed)
S/w dr Humphrey Rolls regarding this pt appt to see here at 12:30pm was to late per [facility] in the chemo room. Per dr Humphrey Rolls it is ok to put the pt back on lindsey's schedule where she orginally was before.

## 2014-03-28 ENCOUNTER — Other Ambulatory Visit: Payer: BC Managed Care – PPO

## 2014-03-28 ENCOUNTER — Encounter: Payer: Self-pay | Admitting: *Deleted

## 2014-03-29 ENCOUNTER — Telehealth: Payer: Self-pay | Admitting: *Deleted

## 2014-03-29 ENCOUNTER — Telehealth: Payer: Self-pay | Admitting: Oncology

## 2014-03-29 NOTE — Telephone Encounter (Signed)
Moved patient appt from 7/3 to 7/2 due to the holiday. Ok to move per Automotive engineer

## 2014-03-29 NOTE — Telephone Encounter (Signed)
s.w. pt husband and confirmed 5.22 appt d.t.Marland KitchenMarland KitchenMarland Kitchenok and aware

## 2014-03-30 ENCOUNTER — Encounter: Payer: Self-pay | Admitting: Oncology

## 2014-03-30 NOTE — Progress Notes (Signed)
Pt's husband called with billing concerns regarding the surgeon.  I advised him to call the surgeon's office to inquire about any financial assistance programs they may have available or possibly get set up on a payment plan with them.  I informed him of the [employer] card they can apply for thru the hospital.  We also talked about the [employer] and [employer].  He's going to bring me their income info to see if they qualify.

## 2014-04-05 ENCOUNTER — Ambulatory Visit (HOSPITAL_COMMUNITY)
Admission: RE | Admit: 2014-04-05 | Discharge: 2014-04-05 | Disposition: A | Discharge status: Home / Self Care | Payer: BC Managed Care – PPO | Source: Ambulatory Visit | Attending: Family Medicine | Admitting: Family Medicine

## 2014-04-05 ENCOUNTER — Ambulatory Visit (HOSPITAL_BASED_OUTPATIENT_CLINIC_OR_DEPARTMENT_OTHER)
Admission: RE | Admit: 2014-04-05 | Discharge: 2014-04-05 | Disposition: A | Discharge status: Home / Self Care | Payer: BC Managed Care – PPO | Source: Ambulatory Visit | Attending: Internal Medicine | Admitting: Internal Medicine

## 2014-04-05 ENCOUNTER — Other Ambulatory Visit (HOSPITAL_COMMUNITY): Payer: Self-pay | Admitting: Internal Medicine

## 2014-04-05 VITALS — BP 140/78 | HR 70 | Wt 205.5 lb

## 2014-04-05 DIAGNOSIS — C50919 Malignant neoplasm of unspecified site of unspecified female breast: Secondary | ICD-10-CM | POA: Insufficient documentation

## 2014-04-05 DIAGNOSIS — C50419 Malignant neoplasm of upper-outer quadrant of unspecified female breast: Secondary | ICD-10-CM

## 2014-04-05 DIAGNOSIS — I517 Cardiomegaly: Secondary | ICD-10-CM

## 2014-04-05 DIAGNOSIS — C50411 Malignant neoplasm of upper-outer quadrant of right female breast: Secondary | ICD-10-CM

## 2014-04-05 DIAGNOSIS — Z87891 Personal history of nicotine dependence: Secondary | ICD-10-CM | POA: Insufficient documentation

## 2014-04-05 NOTE — Progress Notes (Signed)
Patient ID: Brandy Jackson, female   DOB: 10-Oct-1951, 63 y.o.   MRN: [id number]  Cardio-oncology Clinic Consult  HPI:  Brandy Jackson is a 63 y.o. Woman with no cardiac history referred by Dr. Chancy Milroy to the cardio-oncology clinic. 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 Tumor was ER positive PR positive HER-2/neu positive (triple positive) with a proliferation marker Ki-67 at 90%. She had MRI of the breasts performed which showed a solitary mass in the upper outer quadrant measuring 2.5 cm.  She is scheduled for treatment with Taxotere carboplatinum to be given every 3 weeks for a total of 6 cycles. She will also receive Herceptin on a weekly basis for duration of her chemotherapy. Once she finishes chemotherapy we will then switch the Herceptin to every 3 weeks to finish out a total of one year.  Feels well. Denies CP, SOB or edema. BP slightly elevated here but says it is because she is nervous. Has not had problems with HTN.  Echo 04/05/14; EF 60-65% Lat s ' 10.2 GLS -17.1%    Review of Systems: [y] = yes, '[ ]'  = no   General: Weight gain '[ ]' ; Weight loss '[ ]' ; Anorexia '[ ]' ; Fatigue '[ ]' ; Fever '[ ]' ; Chills '[ ]' ; Weakness '[ ]'   Cardiac: Chest pain/pressure '[ ]' ; Resting SOB '[ ]' ; Exertional SOB '[ ]' ; Orthopnea '[ ]' ; Pedal Edema '[ ]' ; Palpitations '[ ]' ; Syncope '[ ]' ; Presyncope '[ ]' ; Paroxysmal nocturnal dyspnea'[ ]'   Pulmonary: Cough '[ ]' ; Wheezing'[ ]' ; Hemoptysis'[ ]' ; Sputum '[ ]' ; Snoring '[ ]'   GI: Vomiting'[ ]' ; Dysphagia'[ ]' ; Melena'[ ]' ; Hematochezia '[ ]' ; Heartburn'[ ]' ; Abdominal pain '[ ]' ; Constipation '[ ]' ; Diarrhea '[ ]' ; BRBPR '[ ]'   GU: Hematuria'[ ]' ; Dysuria '[ ]' ; Nocturia'[ ]'   Vascular: Pain in legs with walking '[ ]' ; Pain in feet with lying flat '[ ]' ; Non-healing sores '[ ]' ; Stroke '[ ]' ; TIA '[ ]' ; Slurred speech '[ ]' ;  Neuro: Headaches'[ ]' ; Vertigo'[ ]' ; Seizures'[ ]' ; Paresthesias'[ ]' ;Blurred vision '[ ]' ; Diplopia '[ ]' ; Vision changes '[ ]'   Ortho/Skin: Arthritis '[ ]' ; Joint pain Blue.Reese ]; Muscle pain  '[ ]' ; Joint swelling '[ ]' ; Back Pain '[ ]' ; Rash '[ ]'   Psych: Depression'[ ]' ; Anxiety'[ ]'   Heme: Bleeding problems '[ ]' ; Clotting disorders '[ ]' ; Anemia '[ ]'   Endocrine: Diabetes [ y]; Thyroid dysfunction'[ ]'    Past Medical History  Diagnosis Date  . Diabetes mellitus without complication   . Arthritis   . Hot flashes   . Cancer   . Breast cancer     Current Outpatient Prescriptions  Medication Sig Dispense Refill  . lidocaine-prilocaine (EMLA) cream Apply topically as needed.  30 g  6  . metFORMIN (GLUCOPHAGE) 500 MG tablet Take 500 mg by mouth 2 (two) times daily with a meal.      . naproxen (NAPROSYN) 250 MG tablet Take 250 mg by mouth as needed.       No current facility-administered medications for this encounter.    Allergies  Allergen Reactions  . Sulfa Antibiotics Rash    History   Social History  . Marital Status: Married    Spouse Name: N/A    Number of Children: N/A  . Years of Education: N/A   Occupational History  . Not on file.   Social History Main Topics  . Smoking status: Former Smoker -- 1.00 packs/day    Quit date: 03/08/1986  . Smokeless tobacco: Never  Used  . Alcohol Use: No  . Drug Use: No  . Sexual Activity: Yes   Other Topics Concern  . Not on file   Social History Narrative  . No narrative on file    Family History: no family history of premature CAD or HF  PHYSICAL EXAM: Filed Vitals:   04/05/14 1158  BP: 140/78  Pulse: 70  Weight: 205 lb 8 oz (93.214 kg)  SpO2: 97%    General:  Well appearing. No respiratory difficulty HEENT: normal Neck: supple. no JVD. Carotids 2+ bilat; no bruits. No lymphadenopathy or thryomegaly appreciated. Cor: PMI nondisplaced. Regular rate & rhythm. No rubs, gallops or murmurs. Lungs: clear Abdomen: soft, nontender, nondistended. No hepatosplenomegaly. No bruits or masses. Good bowel sounds. Extremities: no cyanosis, clubbing, rash, edema Neuro: alert & oriented x 3, cranial nerves grossly intact. moves  all 4 extremities w/o difficulty. Affect pleasant.   ASSESSMENT & PLAN:  1. Breast CA  Explained incidence of Herceptin cardiotoxicity and role of Cardio-oncology clinic at length. Echo images reviewed personally. All parameters stable. Reviewed signs and symptoms of HF to look for. Continue Herceptin. Follow-up with echo in 3 months.  Brandy Pascal Serenitie Vinton,MD 5:22 PM

## 2014-04-05 NOTE — Patient Instructions (Signed)
We will contact you in 3 months to schedule your next appointment and echocardiogram  

## 2014-04-05 NOTE — Progress Notes (Signed)
  Echocardiogram 2D Echocardiogram has been performed.  [name] 04/05/2014, 12:07 PM

## 2014-04-06 ENCOUNTER — Ambulatory Visit: Payer: BC Managed Care – PPO

## 2014-04-06 ENCOUNTER — Other Ambulatory Visit: Payer: BC Managed Care – PPO

## 2014-04-06 ENCOUNTER — Ambulatory Visit: Payer: BC Managed Care – PPO | Admitting: Adult Health

## 2014-04-07 ENCOUNTER — Ambulatory Visit
Admission: RE | Admit: 2014-04-07 | Discharge: 2014-04-07 | Disposition: A | Discharge status: Home / Self Care | Payer: BC Managed Care – PPO | Source: Ambulatory Visit | Attending: Surgery | Admitting: Surgery

## 2014-04-07 ENCOUNTER — Inpatient Hospital Stay: Admission: RE | Admit: 2014-04-07 | Payer: BC Managed Care – PPO | Source: Ambulatory Visit

## 2014-04-07 ENCOUNTER — Ambulatory Visit: Payer: BC Managed Care – PPO | Admitting: Adult Health

## 2014-04-07 ENCOUNTER — Other Ambulatory Visit (INDEPENDENT_AMBULATORY_CARE_PROVIDER_SITE_OTHER): Payer: Self-pay | Admitting: Surgery

## 2014-04-07 ENCOUNTER — Other Ambulatory Visit: Payer: BC Managed Care – PPO

## 2014-04-07 ENCOUNTER — Encounter (HOSPITAL_BASED_OUTPATIENT_CLINIC_OR_DEPARTMENT_OTHER): Payer: Self-pay | Admitting: *Deleted

## 2014-04-07 ENCOUNTER — Encounter (HOSPITAL_BASED_OUTPATIENT_CLINIC_OR_DEPARTMENT_OTHER)
Admission: RE | Admit: 2014-04-07 | Discharge: 2014-04-07 | Disposition: A | Discharge status: Home / Self Care | Payer: BC Managed Care – PPO | Source: Ambulatory Visit | Attending: Surgery | Admitting: Surgery

## 2014-04-07 DIAGNOSIS — Z01810 Encounter for preprocedural cardiovascular examination: Secondary | ICD-10-CM | POA: Insufficient documentation

## 2014-04-07 DIAGNOSIS — C50911 Malignant neoplasm of unspecified site of right female breast: Secondary | ICD-10-CM

## 2014-04-07 DIAGNOSIS — Z01812 Encounter for preprocedural laboratory examination: Secondary | ICD-10-CM | POA: Insufficient documentation

## 2014-04-07 LAB — CBC WITH DIFFERENTIAL/PLATELET
Basophils Absolute: 0 10*3/uL (ref 0.0–0.1)
Basophils Relative: 0 % (ref 0–1)
Eosinophils Absolute: 0 10*3/uL (ref 0.0–0.7)
Eosinophils Relative: 0 % (ref 0–5)
HCT: 41.1 % (ref 36.0–46.0)
HEMOGLOBIN: 13.7 g/dL (ref 12.0–15.0)
Lymphocytes Relative: 31 % (ref 12–46)
Lymphs Abs: 2 10*3/uL (ref 0.7–4.0)
MCH: 27.1 pg (ref 26.0–34.0)
MCHC: 33.3 g/dL (ref 30.0–36.0)
MCV: 81.4 fL (ref 78.0–100.0)
MONOS PCT: 8 % (ref 3–12)
Monocytes Absolute: 0.5 10*3/uL (ref 0.1–1.0)
NEUTROS ABS: 4 10*3/uL (ref 1.7–7.7)
NEUTROS PCT: 61 % (ref 43–77)
PLATELETS: 256 10*3/uL (ref 150–400)
RBC: 5.05 MIL/uL (ref 3.87–5.11)
RDW: 14 % (ref 11.5–15.5)
WBC: 6.5 10*3/uL (ref 4.0–10.5)

## 2014-04-07 LAB — COMPREHENSIVE METABOLIC PANEL
ALK PHOS: 68 U/L (ref 39–117)
ALT: 19 U/L (ref 0–35)
AST: 20 U/L (ref 0–37)
Albumin: 3.9 g/dL (ref 3.5–5.2)
BILIRUBIN TOTAL: 0.2 mg/dL — AB (ref 0.3–1.2)
BUN: 16 mg/dL (ref 6–23)
CHLORIDE: 103 meq/L (ref 96–112)
CO2: 25 meq/L (ref 19–32)
Calcium: 9.4 mg/dL (ref 8.4–10.5)
Creatinine, Ser: 0.56 mg/dL (ref 0.50–1.10)
GLUCOSE: 126 mg/dL — AB (ref 70–99)
POTASSIUM: 4.2 meq/L (ref 3.7–5.3)
SODIUM: 142 meq/L (ref 137–147)
TOTAL PROTEIN: 7.8 g/dL (ref 6.0–8.3)

## 2014-04-07 NOTE — Progress Notes (Signed)
To come for ekg labs after seeds today

## 2014-04-11 ENCOUNTER — Encounter (HOSPITAL_BASED_OUTPATIENT_CLINIC_OR_DEPARTMENT_OTHER): Payer: BC Managed Care – PPO | Admitting: Anesthesiology

## 2014-04-11 ENCOUNTER — Ambulatory Visit (HOSPITAL_COMMUNITY): Payer: BC Managed Care – PPO

## 2014-04-11 ENCOUNTER — Ambulatory Visit (HOSPITAL_BASED_OUTPATIENT_CLINIC_OR_DEPARTMENT_OTHER)
Admission: RE | Admit: 2014-04-11 | Discharge: 2014-04-11 | Disposition: A | Discharge status: Home / Self Care | Payer: BC Managed Care – PPO | Source: Ambulatory Visit | Attending: Surgery | Admitting: Surgery

## 2014-04-11 ENCOUNTER — Encounter (HOSPITAL_BASED_OUTPATIENT_CLINIC_OR_DEPARTMENT_OTHER)
Admission: RE | Disposition: A | Discharge status: Home / Self Care | Payer: Self-pay | Source: Ambulatory Visit | Attending: Surgery

## 2014-04-11 ENCOUNTER — Encounter (HOSPITAL_COMMUNITY)
Admission: RE | Admit: 2014-04-11 | Discharge: 2014-04-11 | Disposition: A | Discharge status: Home / Self Care | Payer: BC Managed Care – PPO | Source: Ambulatory Visit | Attending: Surgery | Admitting: Surgery

## 2014-04-11 ENCOUNTER — Encounter (HOSPITAL_BASED_OUTPATIENT_CLINIC_OR_DEPARTMENT_OTHER): Payer: Self-pay | Admitting: *Deleted

## 2014-04-11 ENCOUNTER — Ambulatory Visit (HOSPITAL_BASED_OUTPATIENT_CLINIC_OR_DEPARTMENT_OTHER): Payer: BC Managed Care – PPO | Admitting: Anesthesiology

## 2014-04-11 ENCOUNTER — Ambulatory Visit
Admission: RE | Admit: 2014-04-11 | Discharge: 2014-04-11 | Disposition: A | Discharge status: Home / Self Care | Payer: BC Managed Care – PPO | Source: Ambulatory Visit | Attending: Surgery | Admitting: Surgery

## 2014-04-11 DIAGNOSIS — M129 Arthropathy, unspecified: Secondary | ICD-10-CM | POA: Insufficient documentation

## 2014-04-11 DIAGNOSIS — Z87891 Personal history of nicotine dependence: Secondary | ICD-10-CM | POA: Insufficient documentation

## 2014-04-11 DIAGNOSIS — C50911 Malignant neoplasm of unspecified site of right female breast: Secondary | ICD-10-CM

## 2014-04-11 DIAGNOSIS — Z79899 Other long term (current) drug therapy: Secondary | ICD-10-CM | POA: Insufficient documentation

## 2014-04-11 DIAGNOSIS — E119 Type 2 diabetes mellitus without complications: Secondary | ICD-10-CM | POA: Insufficient documentation

## 2014-04-11 DIAGNOSIS — C50919 Malignant neoplasm of unspecified site of unspecified female breast: Secondary | ICD-10-CM | POA: Insufficient documentation

## 2014-04-11 DIAGNOSIS — D0590 Unspecified type of carcinoma in situ of unspecified breast: Secondary | ICD-10-CM

## 2014-04-11 HISTORY — DX: Presence of dental prosthetic device (complete) (partial): Z97.2

## 2014-04-11 HISTORY — PX: PORTACATH PLACEMENT: SHX2246

## 2014-04-11 HISTORY — DX: Presence of spectacles and contact lenses: Z97.3

## 2014-04-11 LAB — GLUCOSE, CAPILLARY: GLUCOSE-CAPILLARY: 151 mg/dL — AB (ref 70–99)

## 2014-04-11 SURGERY — RADIOACTIVE SEED GUIDED PARTIAL MASTECTOMY WITH AXILLARY SENTINEL LYMPH NODE BIOPSY AND AXILLARY LYMPH NODE DISSECTION
Anesthesia: General | Site: Breast | Laterality: Right

## 2014-04-11 MED ORDER — LIDOCAINE HCL (CARDIAC) 20 MG/ML IV SOLN
INTRAVENOUS | Status: DC | PRN
  Administered 2014-04-11: 50 mg via INTRAVENOUS

## 2014-04-11 MED ORDER — CHLORHEXIDINE GLUCONATE 4 % EX LIQD: 1.0000 "application " | Freq: Once | CUTANEOUS | Status: DC

## 2014-04-11 MED ORDER — BUPIVACAINE-EPINEPHRINE (PF) 0.5% -1:200000 IJ SOLN
INTRAMUSCULAR | Status: DC | PRN
  Administered 2014-04-11: 20 mL

## 2014-04-11 MED ORDER — HYDROMORPHONE HCL PF 1 MG/ML IJ SOLN
INTRAMUSCULAR | Status: AC
  Filled 2014-04-11: qty 1

## 2014-04-11 MED ORDER — OXYCODONE HCL 5 MG/5ML PO SOLN: 5.0000 mg | Freq: Once | ORAL | Status: DC | PRN

## 2014-04-11 MED ORDER — BUPIVACAINE-EPINEPHRINE (PF) 0.25% -1:200000 IJ SOLN
INTRAMUSCULAR | Status: AC
  Filled 2014-04-11: qty 30

## 2014-04-11 MED ORDER — METHYLENE BLUE 1 % INJ SOLN
INTRAMUSCULAR | Status: AC
  Filled 2014-04-11: qty 10

## 2014-04-11 MED ORDER — LACTATED RINGERS IV SOLN
INTRAVENOUS | Status: DC
  Administered 2014-04-11 (×2): via INTRAVENOUS

## 2014-04-11 MED ORDER — HEPARIN (PORCINE) IN NACL 2-0.9 UNIT/ML-% IJ SOLN
INTRAMUSCULAR | Status: AC
  Filled 2014-04-11: qty 500

## 2014-04-11 MED ORDER — CEFAZOLIN SODIUM-DEXTROSE 2-3 GM-% IV SOLR
2.0000 g | INTRAVENOUS | Status: AC
  Administered 2014-04-11: 2 g via INTRAVENOUS

## 2014-04-11 MED ORDER — FENTANYL CITRATE 0.05 MG/ML IJ SOLN
50.0000 ug | INTRAMUSCULAR | Status: DC | PRN
  Administered 2014-04-11: 100 ug via INTRAVENOUS

## 2014-04-11 MED ORDER — ONDANSETRON HCL 4 MG/2ML IJ SOLN: 4.0000 mg | Freq: Once | INTRAMUSCULAR | Status: DC | PRN

## 2014-04-11 MED ORDER — OXYCODONE-ACETAMINOPHEN 5-325 MG PO TABS: 1.0000 | ORAL_TABLET | ORAL | Status: DC | PRN

## 2014-04-11 MED ORDER — HEPARIN SOD (PORK) LOCK FLUSH 100 UNIT/ML IV SOLN
INTRAVENOUS | Status: DC | PRN
  Administered 2014-04-11: 500 [IU] via INTRAVENOUS

## 2014-04-11 MED ORDER — ONDANSETRON HCL 4 MG/2ML IJ SOLN
INTRAMUSCULAR | Status: DC | PRN
  Administered 2014-04-11: 4 mg via INTRAVENOUS

## 2014-04-11 MED ORDER — SODIUM CHLORIDE 0.9 % IJ SOLN
INTRAMUSCULAR | Status: AC
  Filled 2014-04-11: qty 10

## 2014-04-11 MED ORDER — MIDAZOLAM HCL 2 MG/2ML IJ SOLN
1.0000 mg | INTRAMUSCULAR | Status: DC | PRN
  Administered 2014-04-11: 2 mg via INTRAVENOUS

## 2014-04-11 MED ORDER — CEFAZOLIN SODIUM-DEXTROSE 2-3 GM-% IV SOLR
INTRAVENOUS | Status: AC
  Filled 2014-04-11: qty 50

## 2014-04-11 MED ORDER — BUPIVACAINE-EPINEPHRINE (PF) 0.5% -1:200000 IJ SOLN
INTRAMUSCULAR | Status: AC
  Filled 2014-04-11: qty 30

## 2014-04-11 MED ORDER — HEMOSTATIC AGENTS (NO CHARGE) OPTIME
TOPICAL | Status: DC | PRN
  Administered 2014-04-11: 1 via TOPICAL

## 2014-04-11 MED ORDER — FENTANYL CITRATE 0.05 MG/ML IJ SOLN
INTRAMUSCULAR | Status: AC
  Filled 2014-04-11: qty 6

## 2014-04-11 MED ORDER — HEPARIN SOD (PORK) LOCK FLUSH 100 UNIT/ML IV SOLN
INTRAVENOUS | Status: AC
  Filled 2014-04-11: qty 5

## 2014-04-11 MED ORDER — HEPARIN (PORCINE) IN NACL 2-0.9 UNIT/ML-% IJ SOLN
INTRAMUSCULAR | Status: DC | PRN
  Administered 2014-04-11: 1 via INTRAVENOUS

## 2014-04-11 MED ORDER — HYDROMORPHONE HCL PF 1 MG/ML IJ SOLN
0.2500 mg | INTRAMUSCULAR | Status: DC | PRN
  Administered 2014-04-11 (×2): 0.5 mg via INTRAVENOUS

## 2014-04-11 MED ORDER — FENTANYL CITRATE 0.05 MG/ML IJ SOLN
INTRAMUSCULAR | Status: AC
  Filled 2014-04-11: qty 2

## 2014-04-11 MED ORDER — PROPOFOL 10 MG/ML IV BOLUS
INTRAVENOUS | Status: DC | PRN
  Administered 2014-04-11: 200 mg via INTRAVENOUS

## 2014-04-11 MED ORDER — MIDAZOLAM HCL 2 MG/2ML IJ SOLN
INTRAMUSCULAR | Status: AC
  Filled 2014-04-11: qty 2

## 2014-04-11 MED ORDER — 0.9 % SODIUM CHLORIDE (POUR BTL) OPTIME
TOPICAL | Status: DC | PRN
  Administered 2014-04-11: 300 mL

## 2014-04-11 MED ORDER — TECHNETIUM TC 99M SULFUR COLLOID FILTERED
1.0000 | Freq: Once | INTRAVENOUS | Status: AC | PRN
  Administered 2014-04-11: 1 via INTRADERMAL

## 2014-04-11 MED ORDER — OXYCODONE HCL 5 MG PO TABS: 5.0000 mg | ORAL_TABLET | Freq: Once | ORAL | Status: DC | PRN

## 2014-04-11 MED ORDER — BUPIVACAINE-EPINEPHRINE (PF) 0.25% -1:200000 IJ SOLN
INTRAMUSCULAR | Status: DC | PRN
  Administered 2014-04-11: 10 mL

## 2014-04-11 MED ORDER — FENTANYL CITRATE 0.05 MG/ML IJ SOLN
INTRAMUSCULAR | Status: DC | PRN
  Administered 2014-04-11: 100 ug via INTRAVENOUS
  Administered 2014-04-11: 25 ug via INTRAVENOUS
  Administered 2014-04-11: 50 ug via INTRAVENOUS

## 2014-04-11 SURGICAL SUPPLY — 80 items
ADH SKN CLS APL DERMABOND .7 (GAUZE/BANDAGES/DRESSINGS) ×4
APL SKNCLS STERI-STRIP NONHPOA (GAUZE/BANDAGES/DRESSINGS)
APPLIER CLIP 9.375 MED OPEN (MISCELLANEOUS) ×4
APR CLP MED 9.3 20 MLT OPN (MISCELLANEOUS) ×2
BAG DECANTER FOR FLEXI CONT (MISCELLANEOUS) ×4 IMPLANT
BENZOIN TINCTURE PRP APPL 2/3 (GAUZE/BANDAGES/DRESSINGS) IMPLANT
BINDER BREAST LRG (GAUZE/BANDAGES/DRESSINGS) IMPLANT
BINDER BREAST MEDIUM (GAUZE/BANDAGES/DRESSINGS) IMPLANT
BINDER BREAST XLRG (GAUZE/BANDAGES/DRESSINGS) ×2 IMPLANT
BINDER BREAST XXLRG (GAUZE/BANDAGES/DRESSINGS) IMPLANT
BLADE 11 SAFETY STRL DISP (BLADE) ×4 IMPLANT
BLADE 15 SAFETY STRL DISP (BLADE) ×4 IMPLANT
CANISTER SUC SOCK COL 7IN (MISCELLANEOUS) IMPLANT
CANISTER SUCT 1200ML W/VALVE (MISCELLANEOUS) ×4 IMPLANT
CHLORAPREP W/TINT 26ML (MISCELLANEOUS) ×4 IMPLANT
CLEANER CAUTERY TIP 5X5 PAD (MISCELLANEOUS) ×2 IMPLANT
CLIP APPLIE 9.375 MED OPEN (MISCELLANEOUS) ×2 IMPLANT
CLIP TI WIDE RED SMALL 6 (CLIP) ×2 IMPLANT
CLOSURE WOUND 1/2 X4 (GAUZE/BANDAGES/DRESSINGS)
COVER MAYO STAND STRL (DRAPES) ×4 IMPLANT
COVER PROBE W GEL 5X96 (DRAPES) ×4 IMPLANT
COVER TABLE BACK 60X90 (DRAPES) ×4 IMPLANT
DECANTER SPIKE VIAL GLASS SM (MISCELLANEOUS) IMPLANT
DERMABOND ADVANCED (GAUZE/BANDAGES/DRESSINGS) ×4
DERMABOND ADVANCED .7 DNX12 (GAUZE/BANDAGES/DRESSINGS) ×2 IMPLANT
DEVICE DUBIN W/COMP PLATE 8390 (MISCELLANEOUS) ×4 IMPLANT
DRAPE C-ARM 42X72 X-RAY (DRAPES) ×4 IMPLANT
DRAPE LAPAROSCOPIC ABDOMINAL (DRAPES) ×4 IMPLANT
DRAPE PED LAPAROTOMY (DRAPES) ×2 IMPLANT
DRAPE UTILITY XL STRL (DRAPES) ×7 IMPLANT
DRSG TEGADERM 2-3/8X2-3/4 SM (GAUZE/BANDAGES/DRESSINGS) IMPLANT
ELECT COATED BLADE 2.86 ST (ELECTRODE) ×4 IMPLANT
ELECT REM PT RETURN 9FT ADLT (ELECTROSURGICAL) ×4
ELECTRODE REM PT RTRN 9FT ADLT (ELECTROSURGICAL) ×2 IMPLANT
GLOVE BIOGEL PI IND STRL 8 (GLOVE) ×2 IMPLANT
GLOVE BIOGEL PI INDICATOR 8 (GLOVE) ×2
GLOVE ECLIPSE 8.0 STRL XLNG CF (GLOVE) ×4 IMPLANT
GOWN STRL REUS W/ TWL LRG LVL3 (GOWN DISPOSABLE) ×4 IMPLANT
GOWN STRL REUS W/TWL LRG LVL3 (GOWN DISPOSABLE) ×8
HEMOSTAT SNOW SURGICEL 2X4 (HEMOSTASIS) ×8 IMPLANT
IV KIT MINILOC 20X1 SAFETY (NEEDLE) IMPLANT
KIT MARKER MARGIN INK (KITS) ×4 IMPLANT
KIT PORT POWER 8FR ISP CVUE (Catheter) ×2 IMPLANT
NDL HYPO 25X1 1.5 SAFETY (NEEDLE) ×1 IMPLANT
NDL SAFETY ECLIPSE 18X1.5 (NEEDLE) IMPLANT
NDL SPNL 22GX3.5 QUINCKE BK (NEEDLE) IMPLANT
NEEDLE HYPO 18GX1.5 SHARP (NEEDLE)
NEEDLE HYPO 22GX1.5 SAFETY (NEEDLE) IMPLANT
NEEDLE HYPO 25X1 1.5 SAFETY (NEEDLE) ×4 IMPLANT
NEEDLE SPNL 22GX3.5 QUINCKE BK (NEEDLE) IMPLANT
NS IRRIG 1000ML POUR BTL (IV SOLUTION) ×4 IMPLANT
PACK BASIN DAY SURGERY FS (CUSTOM PROCEDURE TRAY) ×4 IMPLANT
PAD CLEANER CAUTERY TIP 5X5 (MISCELLANEOUS) ×2
PENCIL BUTTON HOLSTER BLD 10FT (ELECTRODE) ×4 IMPLANT
PIN SAFETY STERILE (MISCELLANEOUS) IMPLANT
SET SHEATH INTRODUCER 10FR (MISCELLANEOUS) IMPLANT
SHEATH COOK PEEL AWAY SET 9F (SHEATH) IMPLANT
SLEEVE SCD COMPRESS KNEE MED (MISCELLANEOUS) ×4 IMPLANT
SPONGE GAUZE 4X4 12PLY STER LF (GAUZE/BANDAGES/DRESSINGS) IMPLANT
SPONGE LAP 4X18 X RAY DECT (DISPOSABLE) ×4 IMPLANT
STAPLER VISISTAT 35W (STAPLE) IMPLANT
STRIP CLOSURE SKIN 1/2X4 (GAUZE/BANDAGES/DRESSINGS) IMPLANT
SUT MNCRL AB 4-0 PS2 18 (SUTURE) ×6 IMPLANT
SUT MON AB 4-0 PC3 18 (SUTURE) ×4 IMPLANT
SUT PROLENE 2 0 CT2 30 (SUTURE) ×2 IMPLANT
SUT PROLENE 2 0 SH DA (SUTURE) ×4 IMPLANT
SUT SILK 2 0 SH (SUTURE) IMPLANT
SUT SILK 2 0 TIES 17X18 (SUTURE)
SUT SILK 2-0 18XBRD TIE BLK (SUTURE) IMPLANT
SUT VIC AB 3-0 SH 27 (SUTURE) ×4
SUT VIC AB 3-0 SH 27X BRD (SUTURE) ×2 IMPLANT
SUT VICRYL 3-0 CR8 SH (SUTURE) ×2 IMPLANT
SYR 5ML LUER SLIP (SYRINGE) ×4 IMPLANT
SYR BULB 3OZ (MISCELLANEOUS) ×2 IMPLANT
SYR CONTROL 10ML LL (SYRINGE) ×4 IMPLANT
TOWEL OR 17X24 6PK STRL BLUE (TOWEL DISPOSABLE) ×8 IMPLANT
TOWEL OR NON WOVEN STRL DISP B (DISPOSABLE) ×4 IMPLANT
TUBE CONNECTING 20'X1/4 (TUBING) ×1
TUBE CONNECTING 20X1/4 (TUBING) ×1 IMPLANT
YANKAUER SUCT BULB TIP NO VENT (SUCTIONS) ×2 IMPLANT

## 2014-04-11 NOTE — Anesthesia Procedure Notes (Addendum)
Anesthesia Regional Block:  Pectoralis blockPectoralis block Narrative:    Anesthesia Regional Block:  Pectoralis block  Pre-Anesthetic Checklist: ,, timeout performed, Correct Patient, Correct Site, Correct Laterality, Correct Procedure, Correct Position, site marked, Risks and benefits discussed,  Surgical consent,  Pre-op evaluation,  At surgeon's request and post-op pain management  Laterality: Right and Upper  Prep: chloraprep       Needles:  Injection technique: Single-shot  Needle Type: Echogenic Needle     Needle Length: 9cm 9 cm Needle Gauge: 21 and 21 G    Additional Needles:  Procedures: ultrasound guided (picture in chart) Pectoralis block Narrative:  Start time: 04/11/2014 7:55 AM End time: 04/11/2014 8:05 AM Injection made incrementally with aspirations every 5 mL.  Performed by: Personally  Anesthesiologist: Lorrene Reid, MD   Procedure Name: LMA Insertion Date/Time: 04/11/2014 8:50 AM Performed by: Melynda Ripple D Pre-anesthesia Checklist: Patient identified, Emergency Drugs available, Suction available and Patient being monitored Patient Re-evaluated:Patient Re-evaluated prior to inductionOxygen Delivery Method: Circle System Utilized Preoxygenation: Pre-oxygenation with 100% oxygen Intubation Type: IV induction Ventilation: Mask ventilation without difficulty LMA: LMA inserted LMA Size: 4.0 Number of attempts: 1 Airway Equipment and Method: bite block Placement Confirmation: positive ETCO2 Tube secured with: Tape Dental Injury: Teeth and Oropharynx as per pre-operative assessment

## 2014-04-11 NOTE — Progress Notes (Signed)
Emotional support during breast injections °

## 2014-04-11 NOTE — Op Note (Signed)
Right Breast Seed localized  Lumpectomy with Sentinal Node Biopsy Procedure and left subclavian port placement with flouroscopy  Indications: This patient presents with history of right breast cancer with clinically negative axillary lymph node exam.The procedure has been discussed with the patient. Alternatives to surgery have been discussed with the patient.  Risks of surgery include bleeding,  Infection,  Seroma formation, death,  and the need for further surgery.   The patient understands and wishes to proceed.Sentinel lymph node mapping and dissection has been discussed with the patient.  Risk of bleeding,  Infection,  Seroma formation,  Additional procedures,,  Shoulder weakness ,  Shoulder stiffness,  Nerve and blood vessel injury and reaction to the mapping dyes have been discussed.  Alternatives to surgery have been discussed with the patient.  The patient agrees to proceed.The procedure has been discussed with the patient.  Alternative therapies have been discussed with the patient.  Port placement for chemotherapy discussed.  Alternatives discussed.  Operative risks include bleeding,  Infection,  Organ injury, pneumothorax,  Hemothorax,   Nerve injury,  Blood vessel injury,  DVT,  Pulmonary embolism,  Death,  And possible reoperation.  Medical management risks include worsening of present situation.  The success of the procedure is 50 -90 % at treating patients symptoms.  The patient understands and agrees to proceed. Mastectomy discussed as well.  Pt desired breast conservation. Pt sen in [place].   Pre-operative Diagnosis: right breast cancer stage 2   Post-operative Diagnosis: right breast cancer stage 2  Surgeon: Marcello Moores A. Quamaine Webb   Assistants: or STAFF  Anesthesia: General LMA anesthesia and Local anesthesia 0.25.% bupivacaine, with epinephrine  ASA Class: 2  Procedure Details  The patient was seen in the Holding Room. The risks, benefits, complications, treatment options, and expected  outcomes were discussed with the patient. The possibilities of reaction to medication, pulmonary aspiration, bleeding, infection, the need for additional procedures, failure to diagnose a condition, and creating a complication requiring transfusion or operation were discussed with the patient. The patient concurred with the proposed plan, giving informed consent.  The site of surgery properly noted/marked. The patient was taken to Operating Room # 8, identified as Brandy Jackson and the procedure verified as Breast Lumpectomy and Sentinal Node Biopsy. A Time Out was held and the above information confirmed. Seed placed for localization by radiology and Tc injected in holding for SLN mapping.    have seen the patient in the holding area and confirmed the plans for the procedure as noted above. I reviewed the risks and complications again and the patient has no further questions.    The patient was then taken to the operating room. After satisfactory  LMA anesthesia had been obtained the upper chest and lower neck were prepped and draped as a sterile field. The timeout was done.  The left subclavian vein was entered and the guidewire threaded into the superior vena cava right atrial area under fluoroscopic guidance. An incision was then made on the anterior chest wall and a subcutaneous pocket fashioned for the port reservoir.  The port tubing was then brought through a subcutaneous tunnel from the port site to the guidewire site. The dilator and peel-away sheath were then advanced over the guidewire while monitoring this with fluoroscopy. The guidewire and dilator were removed and the tubing threaded to approximately 22 cm. The peel-away sheath was then removed. The catheter aspirated and flushed easily. Using fluoroscopy the tip was backed out into the superior vena cava right  atrial junction area. It aspirated and flushed easily. The reservoir was attached and the locking mechanism engaged. That  aspirated and flushed easily.  The reservoir was secured to the fascia with 2 sutures of 2-0 Prolene. A final check with fluoroscopy was done to make sure we had no kinks and good positioning of the tip of the catheter. Everything appeared to be okay. The catheter was aspirated, flushed with dilute heparin and then concentrated aqueous heparin.  The incision was then closed with interrupted 3-0 Vicryl, and 4-0 Monocryl subcuticular with Dermabond on the skin.  The lumpectomy was performed by creating an oblique incision over the upper outer quadrant of the RIGHT   breast using the seed placed by radiology.  Dissection was carried down to the pectoral fascia.  Orientation sutures were placed and the skin and pectoralis margins were inked.  Specimen radiography confirmed inclusion of the mammographic lesion,  Seed and clip. Marland Kitchen  Hemostasis was achieved with cautery and Surgicel.   The same incision was used for the node.  Clips placed to mark the cavity.  Background counts approach 0 for Iodine activity.      Using a hand-held gamma probe and setting neoprobe to Tc SETTING,  axillary sentinel nodes were identified transcutaneously.   Dissection was carried through the clavipectoral fascia.  3 level 1 axillary sentinel nodes were removed and submitted to pathology.  The axillary incision was closed with a 3-0 Vicryl subcuticular closure in layers and 3 0 monocryl.    Sterile dressings were applied. At the end of the operation, all sponge, instrument, and needle counts were correct.  Findings: grossly clear surgical margins  Estimated Blood Loss:  Minimal         Drains: none         Total IV Fluids: 600 ml         Specimens: see above         Implants: 75F port          Complications:  None; patient tolerated the procedure well.         Disposition: PACU - hemodynamically stable.         Condition: stable  Attending Attestation: I performed the procedure.

## 2014-04-11 NOTE — Anesthesia Preprocedure Evaluation (Signed)
Anesthesia Evaluation  Patient identified by MRN, date of birth, ID band Patient awake    Reviewed: Allergy & Precautions, H&P , NPO status , Patient's Chart, lab work & pertinent test results  Airway Mallampati: I TM Distance: >3 FB Neck ROM: Full    Dental  (+) Teeth Intact, Dental Advisory Given   Pulmonary former smoker,  breath sounds clear to auscultation        Cardiovascular Rhythm:Regular Rate:Normal     Neuro/Psych    GI/Hepatic   Endo/Other  diabetes, Well Controlled, Type 2, Oral Hypoglycemic Agents  Renal/GU      Musculoskeletal   Abdominal   Peds  Hematology   Anesthesia Other Findings   Reproductive/Obstetrics                           Anesthesia Physical Anesthesia Plan  ASA: II  Anesthesia Plan: General   Post-op Pain Management:    Induction: Intravenous  Airway Management Planned: LMA  Additional Equipment:   Intra-op Plan:   Post-operative Plan: Extubation in OR  Informed Consent: I have reviewed the patients History and Physical, chart, labs and discussed the procedure including the risks, benefits and alternatives for the proposed anesthesia with the patient or authorized representative who has indicated his/her understanding and acceptance.   Dental advisory given  Plan Discussed with: CRNA, Anesthesiologist and Surgeon  Anesthesia Plan Comments:         Anesthesia Quick Evaluation

## 2014-04-11 NOTE — Interval H&P Note (Signed)
History and Physical Interval Note:  04/11/2014 8:15 AM  Brandy Jackson  has presented today for surgery, with the diagnosis of right breast cancer  The various methods of treatment have been discussed with the patient and family. After consideration of risks, benefits and other options for treatment, the patient has consented to  Procedure(s): RADIOACTIVE SEED GUIDED PARTIAL MASTECTOMY WITH AXILLARY SENTINEL LYMPH NODE MAPPING (Right) INSERTION PORT-A-CATH (N/A) as a surgical intervention .  The patient's history has been reviewed, patient examined, no change in status, stable for surgery.  I have reviewed the patient's chart and labs.  Questions were answered to the patient's satisfaction.     Dilynn Munroe A. Twana Wileman

## 2014-04-11 NOTE — OR Nursing (Signed)
Time out times documented 0940 and 0910.  Preprocedural time out done at 0840, time out prior to incision completed at 0910.

## 2014-04-11 NOTE — Transfer of Care (Signed)
Immediate Anesthesia Transfer of Care Note  Patient: Brandy Jackson  Procedure(s) Performed: Procedure(s): RADIOACTIVE SEED GUIDED PARTIAL MASTECTOMY WITH AXILLARY SENTINEL LYMPH NODE MAPPING (Right) INSERTION PORT-A-CATH (N/A)  Patient Location: PACU  Anesthesia Type:General  Level of Consciousness: awake and alert   Airway & Oxygen Therapy: Patient Spontanous Breathing and Patient connected to face mask oxygen  Post-op Assessment: Report given to PACU RN and Post -op Vital signs reviewed and stable  Post vital signs: Reviewed and stable  Complications: No apparent anesthesia complications

## 2014-04-11 NOTE — H&P (Signed)
Transplants    None    Demographics Brandy Jackson 63 year old female  [place] [address] [phone] [name])  Comm Pref: None [place] [address] [phone] (W) Works at [employer]  Breast cancer of upper-outer quadrant of right female breast  Breast cancer, right  Significant History/Details  Smoking: Former Smoker (Quit Date:03/08/1986), 1 ppd  Smokeless Tobacco: Never Used  Alcohol: No  6 open orders  Preferred Language: English  Dialysis HistoryNone   Currently admitted as of 5/5/2015Specialty CommentsEditShow AllReport04/02/15-Pt signed PHI: [name](Spouse)-[birth year]--- 03/13/14 @ 2:04pm I have left a message for pt to call me back and set up her sx/skm DOS 04/11/14 TC-CDS-OP- rt seed localized lupmectomy w/ SLN mapping & PAC insertion, 03/14/14, 19301, 03474, 38900, 36561, 77001, SKM 03/27/14 pt scheduled for op surgery 25/95/63 @ CDS no precert required for cpt codes given per Bea Graff [phone] ref # for phone call [phone]. (chm,skm)    MedicationsHospital Medications Outpatient Medications   New medications from outside sources are available for reconciliation  bupivacaine-epinephrine (MARCAINE W/ EPI) 0.5% -1:200000 injection  ceFAZolin (ANCEF) IVPB 2 g/50 mL premix  chlorhexidine (HIBICLENS) 4 % liquid 1 application  fentaNYL (SUBLIMAZE) injection 50-100 mcg  lactated ringers infusion  midazolam (VERSED) injection 1-2 mg    Preferred Labs   None   Transplant-Related Biopsies (11 years) ** None **  Patient Blood Type (50 years)   None                                 Recent Visits (Maximum of 10 visits)Date Type Provider Description  04/11/2014 Surgery Amarise Lillo A., MD   03/08/2014 Office Visit Rushil Kimbrell A., MD Breast Cancer, Right (Primary Dx)         My Last Outpatient  Progress NoteStatus Last Edited Encounter Date  Signed Wed Mar 08, 2014 3:06 PM EDT 03/08/2014  Patient ID: Brandy Jackson, female   DOB: Aug 01, 1951, 63 y.o.   MRN: [id number]   No chief complaint on file.   HPI SABRE ROMBERGER is a 63 y.o. female.  Pt presents at the request of Dr Titus Mould for right breast cancer. Pt noticed a lump 1 month ago.  Mammogram,  MRI and biopsy show 2.5 cm IDC upper outer quadrant ER/PR/HER 2 NEU positive.  Sore from biopsy but otherwise ok.   HPI    Past Medical History   Diagnosis  Date   .  Diabetes mellitus without complication     .  Arthritis     .  Hot flashes         Past Surgical History   Procedure  Laterality  Date   .  Bladder repair w/ cesarean section          History reviewed. No pertinent family history.   Social History History   Substance Use Topics   .  Smoking status:  Former Smoker -- 1.00 packs/day       Quit date:  03/08/1986   .  Smokeless tobacco:  Not on file   .  Alcohol Use:  No      No Known Allergies    Current Outpatient Prescriptions   Medication  Sig  Dispense  Refill   .  metFORMIN (GLUCOPHAGE) 500 MG tablet  Take 500 mg by mouth 2 (two)  times daily with a meal.         .  naproxen (NAPROSYN) 250 MG tablet  Take 250 mg by mouth as needed.             No current facility-administered medications for this visit.      Review of Systems Review of Systems  Constitutional: Negative for fever, chills and unexpected weight change.  HENT: Negative for congestion, hearing loss, sore throat, trouble swallowing and voice change.   Eyes: Negative for visual disturbance.  Respiratory: Negative for cough and wheezing.   Cardiovascular: Negative for chest pain, palpitations and leg swelling.  Gastrointestinal: Negative for nausea, vomiting, abdominal pain, diarrhea, constipation, blood in stool, abdominal distention and anal bleeding.  Genitourinary: Negative for hematuria, vaginal bleeding and difficulty  urinating.  Musculoskeletal: Negative for arthralgias.  Skin: Negative for rash and wound.  Neurological: Negative for seizures, syncope and headaches.  Hematological: Negative for adenopathy. Does not bruise/bleed easily.  Psychiatric/Behavioral: Negative for confusion.    There were no vitals taken for this visit.   Physical Exam Physical Exam  Constitutional: She is oriented to person, place, and time. She appears well-developed and well-nourished.  HENT:   Head: Normocephalic and atraumatic.  Eyes: Pupils are equal, round, and reactive to light. No scleral icterus.  Neck: Normal range of motion.  Cardiovascular: Normal rate and regular rhythm.   Pulmonary/Chest: Right breast exhibits mass, skin change and tenderness. Right breast exhibits no inverted nipple and no nipple discharge. Left breast exhibits no inverted nipple, no mass, no nipple discharge, no skin change and no tenderness. Breasts are symmetrical.    Musculoskeletal: Normal range of motion.  Lymphadenopathy:    She has no cervical adenopathy.  Neurological: She is alert and oriented to person, place, and time.  Skin: Skin is warm and dry.  Psychiatric: She has a normal mood and affect. Her behavior is normal. Judgment and thought content normal.    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 Pre-treatment MRI.   LABS: BUN and creatinine were obtained on site at [facility] at [address]   Results: BUN 14 mg/dL, Creatinine 0.5 mg/dL, estimated GFR 121.   EXAM:   BILATERAL BREAST MRI WITH AND WITHOUT CONTRAST   TECHNIQUE:   Multiplanar, multisequence MR images of both breasts were obtained   prior to and following the intravenous administration of 18 ml of   MultiHance.   THREE-DIMENSIONAL MR IMAGE RENDERING ON INDEPENDENT WORKSTATION:   Three-dimensional MR images were rendered by post-processing of the   original  MR data on an independent workstation. The   three-dimensional MR images were interpreted, and findings are   reported in the following complete MRI report for this study. Three   dimensional images were evaluated at the independent DynaCad   workstation   COMPARISON: No prior MRI. Mammography 02/22/2014. Right breast   ultrasound 02/22/2014.   FINDINGS:   Breast composition: a. Almost entirely fat.   Background parenchymal enhancement: Minimal.   Right breast: Enhancing approximate 1.7 x 2.5 x 2.4 cm mass with   irregular margins in the outer and slight upper right breast,   junction of middle and posterior 1/3, consistent with biopsy-proven   IDC and DCIS, demonstrating washout and plateau kinetics. Biopsy   clip artifact is present within the mass. No other mass or abnormal   enhancement elsewhere in the right breast.  Left breast: No mass or abnormal enhancement.   Lymph nodes: No abnormal appearing lymph nodes.   Ancillary findings: None.   IMPRESSION:   1. 2.5 cm mass in the outer and slight upper right breast consistent   with the biopsy-proven IDC and DCIS.   2. No evidence of malignancy elsewhere in either breast.   3. No pathologic lymphadenopathy.   RECOMMENDATION:   Treatment plan.   BI-RADS CATEGORY 6: Known biopsy-proven malignancy - appropriate   action should be taken.   Electronically Signed   By: Evangeline Dakin M.D.   On: 03/03/2014 12:09      Assessment Stage 2 right breast cancer upper outer quadrant ER / PR/HER 2 NEU POSITIVE     Plan Ample breast size for lumpectomy.  Will ned chemotherapy post op so port can be placed.  Discussed breast conservation and mastectomy with reconstruction.  Discussed SLN mapping.  Pt would like to conserve her breast.  Recommended right breast seed localized lumpectomy,  Right SLN mapping and port placement.  The procedure has been discussed with the patient. Alternatives to surgery have been discussed with the patient.   Risks of surgery include bleeding,  Infection,  Seroma formation, death,  and the need for further surgery.   The patient understands and wishes to proceed.Sentinel lymph node mapping and dissection has been discussed with the patient.  Risk of bleeding,  Infection,  Seroma formation,  Additional procedures,,  Shoulder weakness ,  Shoulder stiffness,  Nerve and blood vessel injury and reaction to the mapping dyes have been discussed.  Alternatives to surgery have been discussed with the patient.  The patient agrees to proceed.Risk of port placement include bleeding,  Infection pneumothorax,  Hemothorax,  Injury to blood vessels,  Nerves and other mediastinal structures,  Death,  Migration,  Infection,  Malfunction.  She agrees to proceed.       Aquila Menzie A.

## 2014-04-11 NOTE — Discharge Instructions (Signed)
[place] Office Phone Number [phone]  BREAST BIOPSY/ PARTIAL MASTECTOMY: POST OP INSTRUCTIONS  Always review your discharge instruction sheet given to you by the facility where your surgery was performed.  IF YOU HAVE DISABILITY OR FAMILY LEAVE FORMS, YOU MUST BRING THEM TO THE OFFICE FOR PROCESSING.  DO NOT GIVE THEM TO YOUR DOCTOR.  1. A prescription for pain medication may be given to you upon discharge.  Take your pain medication as prescribed, if needed.  If narcotic pain medicine is not needed, then you may take acetaminophen (Tylenol) or ibuprofen (Advil) as needed. 2. Take your usually prescribed medications unless otherwise directed 3. If you need a refill on your pain medication, please contact your pharmacy.  They will contact our office to request authorization.  Prescriptions will not be filled after 5pm or on week-ends. 4. You should eat very light the first 24 hours after surgery, such as soup, crackers, pudding, etc.  Resume your normal diet the day after surgery. 5. Most patients will experience some swelling and bruising in the breast.  Ice packs and a good support bra will help.  Swelling and bruising can take several days to resolve.  6. It is common to experience some constipation if taking pain medication after surgery.  Increasing fluid intake and taking a stool softener will usually help or prevent this problem from occurring.  A mild laxative (Milk of Magnesia or Miralax) should be taken according to package directions if there are no bowel movements after 48 hours. 7. Unless discharge instructions indicate otherwise, you may remove your bandages 24-48 hours after surgery, and you may shower at that time.  You may have steri-strips (small skin tapes) in place directly over the incision.  These strips should be left on the skin for 7-10 days.  If your surgeon used skin glue on the incision, you may shower in 24 hours.  The glue will flake off over the  next 2-3 weeks.  Any sutures or staples will be removed at the office during your follow-up visit. 8. ACTIVITIES:  You may resume regular daily activities (gradually increasing) beginning the next day.  Wearing a good support bra or sports bra minimizes pain and swelling.  You may have sexual intercourse when it is comfortable. a. You may drive when you no longer are taking prescription pain medication, you can comfortably wear a seatbelt, and you can safely maneuver your car and apply brakes. b. RETURN TO WORK:  ______________________________________________________________________________________ 9. You should see your doctor in the office for a follow-up appointment approximately two weeks after your surgery.  Your doctors nurse will typically make your follow-up appointment when she calls you with your pathology report.  Expect your pathology report 2-3 business days after your surgery.  You may call to check if you do not hear from [place] after three days. 10. OTHER INSTRUCTIONS: _______________________________________________________________________________________________ _____________________________________________________________________________________________________________________________________ _____________________________________________________________________________________________________________________________________ _____________________________________________________________________________________________________________________________________  WHEN TO CALL YOUR DOCTOR: 1. Fever over 101.0 2. Nausea and/or vomiting. 3. Extreme swelling or bruising. 4. Continued bleeding from incision. 5. Increased pain, redness, or drainage from the incision.  The clinic staff is available to answer your questions during regular business hours.  Please dont hesitate to call and ask to speak to one of the nurses for clinical concerns.  If you have a medical emergency, go to the nearest  emergency room or call 911.  A surgeon from [facility] is always on call at the hospital.  For further questions, please visit [email or web address] Sentinel  Lymph Node Biopsy Care After Refer to this sheet in the next few weeks. These instructions provide you with information on caring for yourself after your procedure. Your caregiver may also give you more specific instructions. Your treatment has been planned according to current medical practices, but problems sometimes occur. Call your caregiver if you have any problems or questions after your procedure. HOME CARE INSTRUCTIONS  Avoid vigorous exercise. Ask your caregiver when you can return to your normal activities.  You may shower 24 hours after your procedure. It is okay to get your surgical cut (incision) wet. Gently pat the incision dry after you shower.  If you are given a surgical bra, wear it for the next 48 hours. You may remove the bra to shower.  Keep all follow-up appointments as directed by your caregiver.  If you have skin adhesive strips over the incision, do not remove them. They will fall off on their own over time.  Only take over-the-counter or prescription medicines for pain, fever, or discomfort as directed by your caregiver.  You may resume your regular diet. SEEK IMMEDIATE MEDICAL CARE IF:   Your pain is not controlled with medicine.  You notice redness, swelling, or increased fluid draining from the incision.  You feel nauseous or vomit. MAKE SURE YOU:  Understand these instructions.  Will watch your condition.  Will get help right away if you are not doing well or get worse. Document Released: 07/08/2004 Document Revised: 02/16/2012 Document Reviewed: 10/20/2011 [facility] Patient Information 2014 [place], [place]. Implanted Port Insertion, Care After Refer to this sheet in the next few weeks. These instructions provide you with information on caring for yourself after your procedure.  Your health care provider may also give you more specific instructions. Your treatment has been planned according to current medical practices, but problems sometimes occur. Call your health care provider if you have any problems or questions after your procedure. WHAT TO EXPECT AFTER THE PROCEDURE After your procedure, it is typical to have the following:   Discomfort at the port insertion site. Ice packs to the area will help.  Bruising on the skin over the port. This will subside in 3 4 days. HOME CARE INSTRUCTIONS  After your port is placed, you will get a manufacturer's information card. The card has information about your port. Keep this card with you at all times.   Know what kind of port you have. There are many types of ports available.   Wear a medical alert bracelet in case of an emergency. This can help alert health care workers that you have a port.   The port can stay in for as long as your health care provider believes it is necessary.   A home health care nurse may give medicines and take care of the port.   You or a family member can get special training and directions for giving medicine and taking care of the port at home.  SEEK MEDICAL CARE IF:  Your port does not flush or you are unable to get a blood return.   SEEK IMMEDIATE MEDICAL CARE IF:  You have new fluid or pus coming from your incision.   You notice a bad smell coming from your incision site.   You have swelling, pain, or more redness at the incision or port site.   You have a fever or chills.   You have chest pain or shortness of breath. Document Released: 09/14/2013 Document Reviewed: 08/01/2013 [facility] Patient Information 2014 [place], [place].  Post Anesthesia Home Care Instructions  Activity: Get plenty of rest for the remainder of the day. A responsible adult should stay with you for 24 hours following the procedure.  For the next 24 hours, DO NOT: -Drive a car -Conservation officer, nature -Drink alcoholic beverages -Take any medication unless instructed by your physician -Make any legal decisions or sign important papers.  Meals: Start with liquid foods such as gelatin or soup. Progress to regular foods as tolerated. Avoid greasy, spicy, heavy foods. If nausea and/or vomiting occur, drink only clear liquids until the nausea and/or vomiting subsides. Call your physician if vomiting continues.  Special Instructions/Symptoms: Your throat may feel dry or sore from the anesthesia or the breathing tube placed in your throat during surgery. If this causes discomfort, gargle with warm salt water. The discomfort should disappear within 24 hours.

## 2014-04-11 NOTE — Anesthesia Postprocedure Evaluation (Signed)
  Anesthesia Post-op Note  Patient: Brandy Jackson  Procedure(s) Performed: Procedure(s): RADIOACTIVE SEED GUIDED PARTIAL MASTECTOMY WITH AXILLARY SENTINEL LYMPH NODE MAPPING (Right) INSERTION PORT-A-CATH (N/A)  Patient Location: PACU  Anesthesia Type:GA combined with regional for post-op pain  Level of Consciousness: awake, alert  and oriented  Airway and Oxygen Therapy: Patient Spontanous Breathing  Post-op Pain: none  Post-op Assessment: Post-op Vital signs reviewed  Post-op Vital Signs: Reviewed  Last Vitals:  Filed Vitals:   04/11/14 1115  BP:   Pulse: 95  Temp:   Resp: 18    Complications: No apparent anesthesia complications

## 2014-04-11 NOTE — Progress Notes (Signed)
Assisted Dr. Crews with right, ultrasound guided, pectoralis block. Side rails up, monitors on throughout procedure. See vital signs in flow sheet. Tolerated Procedure well. 

## 2014-04-12 NOTE — Addendum Note (Signed)
Addendum created 04/12/14 1342 by Tawni Millers, CRNA   Modules edited: Charges VN

## 2014-04-13 ENCOUNTER — Encounter (HOSPITAL_BASED_OUTPATIENT_CLINIC_OR_DEPARTMENT_OTHER): Payer: Self-pay | Admitting: Surgery

## 2014-04-13 ENCOUNTER — Telehealth (INDEPENDENT_AMBULATORY_CARE_PROVIDER_SITE_OTHER): Payer: Self-pay

## 2014-04-13 NOTE — Telephone Encounter (Signed)
Pt s/p right lumpectomy on 04/11/14. He was just calling to see if they could put Neosporin on the incision site. Informed him that this close out of surgery we would not recommend anything on the incision site. Explained that if physician agreed that possibly after two weeks she could put cream on the incision site.  Pt verbalized understanding and agreed with POC.

## 2014-04-14 ENCOUNTER — Telehealth (INDEPENDENT_AMBULATORY_CARE_PROVIDER_SITE_OTHER): Payer: Self-pay

## 2014-04-14 ENCOUNTER — Ambulatory Visit: Payer: BC Managed Care – PPO | Admitting: Adult Health

## 2014-04-14 ENCOUNTER — Other Ambulatory Visit: Payer: BC Managed Care – PPO

## 2014-04-14 ENCOUNTER — Ambulatory Visit: Payer: BC Managed Care – PPO

## 2014-04-14 NOTE — Telephone Encounter (Signed)
Message copied by Carlene Coria on Fri Apr 14, 2014  5:16 PM ------      Message from: Brandy Jackson      Created: Fri Apr 14, 2014  1:34 PM      Contact: [phone]       Pt waiting on results. She will be available after 330 today ------

## 2014-04-14 NOTE — Telephone Encounter (Signed)
LMOM> will try and reach pt again before I leave. If not, I will call again first thing Monday morning.

## 2014-04-17 ENCOUNTER — Telehealth (INDEPENDENT_AMBULATORY_CARE_PROVIDER_SITE_OTHER): Payer: Self-pay

## 2014-04-17 NOTE — Telephone Encounter (Signed)
Message copied by Carlene Coria on Mon Apr 17, 2014  4:32 PM ------      Message from: Erroll Luna A      Created: Fri Apr 14, 2014  9:35 AM       Close margin and one of 3 nodes involved.  Need to hear back from oncology and radiation to see if further tx necessary ------

## 2014-04-17 NOTE — Telephone Encounter (Signed)
LMOM> 3rd attempt to call pt with path results.

## 2014-04-17 NOTE — Telephone Encounter (Signed)
LMOM for pt to call back to get below path results.

## 2014-04-18 NOTE — Telephone Encounter (Signed)
Pt returned call. I gave her the message below on the pathology results showing a close margin with one of 3 lymph nodes being involved per Dr Brantley Stage. I advised pt that she will need to discuss with medical and radiation oncology about further treatment when goes to her appt next week on 04/28/14. The pt does have a f/u appt with Dr Brantley Stage on 04/24/14.

## 2014-04-21 ENCOUNTER — Ambulatory Visit: Payer: BC Managed Care – PPO

## 2014-04-21 ENCOUNTER — Ambulatory Visit: Payer: BC Managed Care – PPO | Admitting: Adult Health

## 2014-04-21 ENCOUNTER — Other Ambulatory Visit: Payer: BC Managed Care – PPO

## 2014-04-24 ENCOUNTER — Encounter (INDEPENDENT_AMBULATORY_CARE_PROVIDER_SITE_OTHER): Payer: Self-pay | Admitting: Surgery

## 2014-04-24 ENCOUNTER — Ambulatory Visit (INDEPENDENT_AMBULATORY_CARE_PROVIDER_SITE_OTHER): Payer: BC Managed Care – PPO | Admitting: Surgery

## 2014-04-24 VITALS — BP 126/80 | HR 77 | Temp 98.5°F | Ht 67.0 in | Wt 204.0 lb

## 2014-04-24 DIAGNOSIS — Z9889 Other specified postprocedural states: Secondary | ICD-10-CM | POA: Insufficient documentation

## 2014-04-24 NOTE — Patient Instructions (Signed)
Return one month.  Se oncology  On Friday.  Close margin discussed.

## 2014-04-24 NOTE — Progress Notes (Signed)
Patient returns after right breast lumpectomy with sentinel lymph node mapping and Port-A-Cath placement for stage II breast cancer. She is doing well.  Exam: Right breast incision clean dry and intact without cerumen or signs of infection. Left-sided Port-A-Cath intact.  Pathology:1. Breast, lumpectomy, Right - INVASIVE DUCTAL CARCINOMA, SEE COMMENT. - INVASIVE TUMOR IS 0.5 CM TO THE NEAREST MARGIN (SUPERIOR). - POSITIVE FOR LYMPH VASCULAR INVASION. - DUCTAL CARCINOMA IN SITU. - IN SITU CARCINOMA IS 1 MM FROM THE NEAREST LATERAL AND SUPERIOR MARGINS. - SEE TUMOR SYNOPTIC TEMPLATE BELOW. 2. Lymph node, sentinel, biopsy, Right axillary - ONE LYMPH NODE, NEGATIVE FOR TUMOR, (0/1). 3. Lymph node, sentinel, biopsy, Right axillary - ONE LYMPH NODE, POSITIVE FOR METASTATIC MAMMARY CARCINOMA (1/1). - INTRANODAL TUMOR DEPOSIT IS 1.0 CM. - NEGATIVE FOR EXTRACAPSULAR TUMOR EXTENSION. 4. Lymph node, sentinel, biopsy, Right axillary - ONE LYMPH NODE, NEGATIVE FOR TUMOR, (0/1). Microscopic Comment 1. BREAST, INVASIVE TUMOR, WITH LYMPH NODE SAMPLING Specimen, including laterality and lymph node sampling (sentinel, non-sentinel): Right breast with sentinel lymph node sampling. Procedure: Lumpectomy. Histologic type: Ductal. Grade: III of III Tubule formation: 3 Nuclear pleomorphism: 3 Mitotic:3 Tumor size (gross measurement): 2.2 cm Margins: Invasive, distance to closest margin: 0.5 In-situ, distance to closest margin: 1 mm If margin positive, focally or broadly: N/A Lymphovascular invasion: Present. Ductal carcinoma in situ: Present. 1 of 3 FINAL for TASNEEM, CORMIER [phone]) Microscopic Comment(continued) Grade: II of III Extensive intraductal component: Absent. Lobular neoplasia: Absent. Tumor focality: Unifocal Treatment effect: None. If present, treatment effect in breast tissue, lymph nodes or both: N/A Extent of tumor: Skin: Microscopically negative. Nipple: N/A Skeletal  muscle: N/A Lymph nodes: Examined: 3 Sentinel 0 Non-sentinel 3 Total Lymph nodes with metastasis: 1 Isolated tumor cells (< 0.2 mm): 0 Micrometastasis: (> 0.2 mm and < 2.0 mm): 0 Macrometastasis: (> 2.0 mm): x 1 Extracapsular extension: Absent. Breast prognostic profile: Estrogen receptor: Not repeated previous study demonstrated 100% positivity ([id number]) Progesterone receptor: Not repeated previous study demonstrated 0% positivity ([id number]) Her 2 neu: No repeated, previous study demonstrated amplification (4.15) ([id number]) Ki-67: Not repeated, previous study demonstrated 94% proliferation rate ([id number]) Non-neoplastic breast: N/A TNM: pT2, pN1a, pMX Comments: None (CRR:gt, 04/12/14) [place]i RUND DO Pathologist, Electronic Signature (Case signed 04/12/2014)       Impression: Stage II right breast cancer status post breast conservation 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 Tumor margins were widely negative but there are 2 margins are close with DCIS at 1 mm. The margin itself is negative. Given that she will require adjuvant therapy, I would not re\re excise this point. We'll await oncology visit in radiation oncology input. If so desired,re  excision is possible but  the benefit from this is minimal. Return in one month or sooner.

## 2014-04-26 NOTE — Progress Notes (Signed)
Pt discussed in [place].  Consensus was for re excision of close margin for DCIS component. Will set up and arrange with patient.

## 2014-04-26 NOTE — Addendum Note (Signed)
Addended by: Turner Daniels on: 04/26/2014 07:36 AM   Modules accepted: Orders

## 2014-04-28 ENCOUNTER — Ambulatory Visit: Payer: BC Managed Care – PPO | Admitting: Adult Health

## 2014-04-28 ENCOUNTER — Other Ambulatory Visit: Payer: BC Managed Care – PPO

## 2014-04-28 ENCOUNTER — Other Ambulatory Visit: Payer: Self-pay | Admitting: *Deleted

## 2014-04-28 ENCOUNTER — Encounter: Payer: Self-pay | Admitting: Oncology

## 2014-04-28 ENCOUNTER — Ambulatory Visit: Payer: BC Managed Care – PPO | Admitting: Oncology

## 2014-04-28 ENCOUNTER — Other Ambulatory Visit: Payer: Self-pay | Admitting: Oncology

## 2014-04-28 ENCOUNTER — Ambulatory Visit (HOSPITAL_BASED_OUTPATIENT_CLINIC_OR_DEPARTMENT_OTHER): Payer: BC Managed Care – PPO

## 2014-04-28 ENCOUNTER — Ambulatory Visit (HOSPITAL_COMMUNITY)
Admission: RE | Admit: 2014-04-28 | Discharge: 2014-04-28 | Disposition: A | Discharge status: Home / Self Care | Payer: BC Managed Care – PPO | Source: Ambulatory Visit | Attending: Oncology | Admitting: Oncology

## 2014-04-28 ENCOUNTER — Ambulatory Visit (HOSPITAL_BASED_OUTPATIENT_CLINIC_OR_DEPARTMENT_OTHER): Payer: BC Managed Care – PPO | Admitting: Adult Health

## 2014-04-28 ENCOUNTER — Encounter: Payer: Self-pay | Admitting: Adult Health

## 2014-04-28 ENCOUNTER — Other Ambulatory Visit (HOSPITAL_BASED_OUTPATIENT_CLINIC_OR_DEPARTMENT_OTHER): Payer: BC Managed Care – PPO

## 2014-04-28 VITALS — BP 145/75 | HR 80 | Temp 98.3°F | Resp 18 | Ht 67.0 in | Wt 205.0 lb

## 2014-04-28 DIAGNOSIS — C50411 Malignant neoplasm of upper-outer quadrant of right female breast: Secondary | ICD-10-CM

## 2014-04-28 DIAGNOSIS — C50419 Malignant neoplasm of upper-outer quadrant of unspecified female breast: Secondary | ICD-10-CM

## 2014-04-28 DIAGNOSIS — R209 Unspecified disturbances of skin sensation: Secondary | ICD-10-CM | POA: Insufficient documentation

## 2014-04-28 DIAGNOSIS — Z9889 Other specified postprocedural states: Secondary | ICD-10-CM

## 2014-04-28 DIAGNOSIS — Z170 Estrogen receptor positive status [ER+]: Secondary | ICD-10-CM

## 2014-04-28 DIAGNOSIS — M542 Cervicalgia: Secondary | ICD-10-CM | POA: Insufficient documentation

## 2014-04-28 DIAGNOSIS — Z5112 Encounter for antineoplastic immunotherapy: Secondary | ICD-10-CM

## 2014-04-28 LAB — CBC WITH DIFFERENTIAL/PLATELET
BASO%: 0.8 % (ref 0.0–2.0)
BASOS ABS: 0.1 10*3/uL (ref 0.0–0.1)
EOS ABS: 0 10*3/uL (ref 0.0–0.5)
EOS%: 0 % (ref 0.0–7.0)
HCT: 40.6 % (ref 34.8–46.6)
HGB: 12.8 g/dL (ref 11.6–15.9)
LYMPH%: 18.5 % (ref 14.0–49.7)
MCH: 25.9 pg (ref 25.1–34.0)
MCHC: 31.5 g/dL (ref 31.5–36.0)
MCV: 82.1 fL (ref 79.5–101.0)
MONO#: 0.4 10*3/uL (ref 0.1–0.9)
MONO%: 5.5 % (ref 0.0–14.0)
NEUT#: 5.8 10*3/uL (ref 1.5–6.5)
NEUT%: 75.2 % (ref 38.4–76.8)
Platelets: 240 10*3/uL (ref 145–400)
RBC: 4.94 10*6/uL (ref 3.70–5.45)
RDW: 14.3 % (ref 11.2–14.5)
WBC: 7.7 10*3/uL (ref 3.9–10.3)
lymph#: 1.4 10*3/uL (ref 0.9–3.3)

## 2014-04-28 LAB — COMPREHENSIVE METABOLIC PANEL (CC13)
ALBUMIN: 3.7 g/dL (ref 3.5–5.0)
ALK PHOS: 69 U/L (ref 40–150)
ALT: 14 U/L (ref 0–55)
AST: 12 U/L (ref 5–34)
Anion Gap: 13 mEq/L — ABNORMAL HIGH (ref 3–11)
BUN: 11.1 mg/dL (ref 7.0–26.0)
CO2: 22 mEq/L (ref 22–29)
CREATININE: 0.8 mg/dL (ref 0.6–1.1)
Calcium: 9.5 mg/dL (ref 8.4–10.4)
Chloride: 106 mEq/L (ref 98–109)
GLUCOSE: 293 mg/dL — AB (ref 70–140)
POTASSIUM: 3.7 meq/L (ref 3.5–5.1)
Sodium: 141 mEq/L (ref 136–145)
Total Bilirubin: 0.23 mg/dL (ref 0.20–1.20)
Total Protein: 7.4 g/dL (ref 6.4–8.3)

## 2014-04-28 MED ORDER — SODIUM CHLORIDE 0.9 % IV SOLN
4.0000 mg/kg | Freq: Once | INTRAVENOUS | Status: AC
  Administered 2014-04-28: 378 mg via INTRAVENOUS
  Filled 2014-04-28: qty 18

## 2014-04-28 MED ORDER — ACETAMINOPHEN 325 MG PO TABS
ORAL_TABLET | ORAL | Status: AC
Start: 2014-04-28 — End: 2014-04-28
  Filled 2014-04-28: qty 2

## 2014-04-28 MED ORDER — ACETAMINOPHEN 325 MG PO TABS
ORAL_TABLET | ORAL | Status: AC
  Filled 2014-04-28: qty 2

## 2014-04-28 MED ORDER — ONDANSETRON HCL 8 MG PO TABS: 8.0000 mg | ORAL_TABLET | Freq: Two times a day (BID) | ORAL | Status: DC

## 2014-04-28 MED ORDER — SODIUM CHLORIDE 0.9 % IJ SOLN
10.0000 mL | INTRAMUSCULAR | Status: DC | PRN
  Administered 2014-04-28: 10 mL
  Filled 2014-04-28: qty 10

## 2014-04-28 MED ORDER — PROCHLORPERAZINE MALEATE 10 MG PO TABS: 10.0000 mg | ORAL_TABLET | Freq: Four times a day (QID) | ORAL | Status: DC | PRN

## 2014-04-28 MED ORDER — LIDOCAINE-PRILOCAINE 2.5-2.5 % EX CREA: TOPICAL_CREAM | CUTANEOUS | Status: DC | PRN

## 2014-04-28 MED ORDER — ACETAMINOPHEN 325 MG PO TABS
650.0000 mg | ORAL_TABLET | Freq: Once | ORAL | Status: AC
  Administered 2014-04-28: 650 mg via ORAL

## 2014-04-28 MED ORDER — DEXAMETHASONE 4 MG PO TABS
8.0000 mg | ORAL_TABLET | Freq: Two times a day (BID) | ORAL | Status: DC
Start: 2014-04-28 — End: 2014-06-27

## 2014-04-28 MED ORDER — DIPHENHYDRAMINE HCL 25 MG PO CAPS
ORAL_CAPSULE | ORAL | Status: AC
  Filled 2014-04-28: qty 2

## 2014-04-28 MED ORDER — DIPHENHYDRAMINE HCL 25 MG PO CAPS
50.0000 mg | ORAL_CAPSULE | Freq: Once | ORAL | Status: AC
  Administered 2014-04-28: 50 mg via ORAL

## 2014-04-28 MED ORDER — HEPARIN SOD (PORK) LOCK FLUSH 100 UNIT/ML IV SOLN
500.0000 [IU] | Freq: Once | INTRAVENOUS | Status: AC | PRN
  Administered 2014-04-28: 500 [IU]
  Filled 2014-04-28: qty 5

## 2014-04-28 MED ORDER — SODIUM CHLORIDE 0.9 % IV SOLN
Freq: Once | INTRAVENOUS | Status: AC
  Administered 2014-04-28: 14:00:00 via INTRAVENOUS

## 2014-04-28 MED ORDER — LORAZEPAM 0.5 MG PO TABS
0.5000 mg | ORAL_TABLET | Freq: Three times a day (TID) | ORAL | Status: DC
Start: 2014-04-28 — End: 2014-09-01

## 2014-04-28 NOTE — Progress Notes (Signed)
Enrolled pt in the [employer] First [employer] program.  I faxed signed form and activated card today.  °

## 2014-04-28 NOTE — Progress Notes (Signed)
1220-Patient reporting to chemo nurse, pain, as well as numbness, swelling in right shoulder, arm area. Discussed with Charlestine Massed, NP, we will get a vascular doppler on patient to rule out DVT before proceeding with treatment today.

## 2014-04-28 NOTE — Progress Notes (Signed)
Pt is approved for $1000 [employer] fund.  °

## 2014-04-28 NOTE — Patient Instructions (Signed)
Nausea medication  Decadron/Dexamethasone: take two tablets twice a day starting the day before chemotherapy.  Then take two tablets twice a day if needed starting the day after chemotherapy.  Ondansetron/Zofran: take one tablet twice a day starting the day after chemotherapy for three days.   Prochlorperazine/Compazine: Take every 6 hours as needed for nausea  You will continue to see Dr. Haroldine Laws, or Dr. Aundra Dubin, a cardiologist for close monitoring of your heart while receiving Herceptin.    Chemotherapy plan:  Week One: Taxotere/Carboplatin/Herceptin every three weeks with Neulasta the day after  Week Two: Herceptin   Week Three: Herceptin  Once you have received 4-6 cycles of Taxotere/Carboplatin/Herceptin the recommendation is to then change to Herceptin every three weeks to complete one calendar year of Herceptin therapy.    Take Claritin 47m, and Tylenol 6522mor 5001maily starting tomorrow for 5-7 days.  Take Super B complex daily.    Docetaxel injection What is this medicine? DOCETAXEL (doe se TAX el) is a chemotherapy drug. It targets fast dividing cells, like cancer cells, and causes these cells to die. This medicine is used to treat many types of cancers like breast cancer, certain stomach cancers, head and neck cancer, lung cancer, and prostate cancer. This medicine may be used for other purposes; ask your health care provider or pharmacist if you have questions. COMMON BRAND NAME(S): Docefrez , Taxotere What should I tell my health care provider before I take this medicine? They need to know if you have any of these conditions: -infection (especially a virus infection such as chickenpox, cold sores, or herpes) -liver disease -low blood counts, like low white cell, platelet, or red cell counts -an unusual or allergic reaction to docetaxel, polysorbate 80, other chemotherapy agents, other medicines, foods, dyes, or preservatives -pregnant or trying to get  pregnant -breast-feeding How should I use this medicine? This drug is given as an infusion into a vein. It is administered in a hospital or clinic by a specially trained health care professional. Talk to your pediatrician regarding the use of this medicine in children. Special care may be needed. Overdosage: If you think you have taken too much of this medicine contact a poison control center or emergency room at once. NOTE: This medicine is only for you. Do not share this medicine with others. What if I miss a dose? It is important not to miss your dose. Call your doctor or health care professional if you are unable to keep an appointment. What may interact with this medicine? -cyclosporine -erythromycin -ketoconazole -medicines to increase blood counts like filgrastim, pegfilgrastim, sargramostim -vaccines Talk to your doctor or health care professional before taking any of these medicines: -acetaminophen -aspirin -ibuprofen -ketoprofen -naproxen This list may not describe all possible interactions. Give your health care provider a list of all the medicines, herbs, non-prescription drugs, or dietary supplements you use. Also tell them if you smoke, drink alcohol, or use illegal drugs. Some items may interact with your medicine. What should I watch for while using this medicine? Your condition will be monitored carefully while you are receiving this medicine. You will need important blood work done while you are taking this medicine. This drug may make you feel generally unwell. This is not uncommon, as chemotherapy can affect healthy cells as well as cancer cells. Report any side effects. Continue your course of treatment even though you feel ill unless your doctor tells you to stop. In some cases, you may be given additional medicines to help  with side effects. Follow all directions for their use. Call your doctor or health care professional for advice if you get a fever, chills or sore  throat, or other symptoms of a cold or flu. Do not treat yourself. This drug decreases your body's ability to fight infections. Try to avoid being around people who are sick. This medicine may increase your risk to bruise or bleed. Call your doctor or health care professional if you notice any unusual bleeding. Be careful brushing and flossing your teeth or using a toothpick because you may get an infection or bleed more easily. If you have any dental work done, tell your dentist you are receiving this medicine. Avoid taking products that contain aspirin, acetaminophen, ibuprofen, naproxen, or ketoprofen unless instructed by your doctor. These medicines may hide a fever. Do not become pregnant while taking this medicine. Women should inform their doctor if they wish to become pregnant or think they might be pregnant. There is a potential for serious side effects to an unborn child. Talk to your health care professional or pharmacist for more information. Do not breast-feed an infant while taking this medicine. What side effects may I notice from receiving this medicine? Side effects that you should report to your doctor or health care professional as soon as possible: -allergic reactions like skin rash, itching or hives, swelling of the face, lips, or tongue -low blood counts - This drug may decrease the number of white blood cells, red blood cells and platelets. You may be at increased risk for infections and bleeding. -signs of infection - fever or chills, cough, sore throat, pain or difficulty passing urine -signs of decreased platelets or bleeding - bruising, pinpoint red spots on the skin, black, tarry stools, nosebleeds -signs of decreased red blood cells - unusually weak or tired, fainting spells, lightheadedness -breathing problems -fast or irregular heartbeat -low blood pressure -mouth sores -nausea and vomiting -pain, swelling, redness or irritation at the injection site -pain, tingling,  numbness in the hands or feet -swelling of the ankle, feet, hands -weight gain Side effects that usually do not require medical attention (report to your prescriber or health care professional if they continue or are bothersome): -bone pain -complete hair loss including hair on your head, underarms, pubic hair, eyebrows, and eyelashes -diarrhea -excessive tearing -changes in the color of fingernails -loosening of the fingernails -nausea -muscle pain -red flush to skin -sweating -weak or tired This list may not describe all possible side effects. Call your doctor for medical advice about side effects. You may report side effects to FDA at 1-800-FDA-1088. Where should I keep my medicine? This drug is given in a hospital or clinic and will not be stored at home. NOTE: This sheet is a summary. It may not cover all possible information. If you have questions about this medicine, talk to your doctor, pharmacist, or health care provider.  2014, [employer] Standard. (2008-11-06 11:52:10) Carboplatin injection What is this medicine? CARBOPLATIN (KAR boe pla tin) is a chemotherapy drug. It targets fast dividing cells, like cancer cells, and causes these cells to die. This medicine is used to treat ovarian cancer and many other cancers. This medicine may be used for other purposes; ask your health care provider or pharmacist if you have questions. COMMON BRAND NAME(S): Paraplatin What should I tell my health care provider before I take this medicine? They need to know if you have any of these conditions: -blood disorders -hearing problems -kidney disease -recent or ongoing radiation therapy -  an unusual or allergic reaction to carboplatin, cisplatin, other chemotherapy, other medicines, foods, dyes, or preservatives -pregnant or trying to get pregnant -breast-feeding How should I use this medicine? This drug is usually given as an infusion into a vein. It is administered in a hospital or  clinic by a specially trained health care professional. Talk to your pediatrician regarding the use of this medicine in children. Special care may be needed. Overdosage: If you think you have taken too much of this medicine contact a poison control center or emergency room at once. NOTE: This medicine is only for you. Do not share this medicine with others. What if I miss a dose? It is important not to miss a dose. Call your doctor or health care professional if you are unable to keep an appointment. What may interact with this medicine? -medicines for seizures -medicines to increase blood counts like filgrastim, pegfilgrastim, sargramostim -some antibiotics like amikacin, gentamicin, neomycin, streptomycin, tobramycin -vaccines Talk to your doctor or health care professional before taking any of these medicines: -acetaminophen -aspirin -ibuprofen -ketoprofen -naproxen This list may not describe all possible interactions. Give your health care provider a list of all the medicines, herbs, non-prescription drugs, or dietary supplements you use. Also tell them if you smoke, drink alcohol, or use illegal drugs. Some items may interact with your medicine. What should I watch for while using this medicine? Your condition will be monitored carefully while you are receiving this medicine. You will need important blood work done while you are taking this medicine. This drug may make you feel generally unwell. This is not uncommon, as chemotherapy can affect healthy cells as well as cancer cells. Report any side effects. Continue your course of treatment even though you feel ill unless your doctor tells you to stop. In some cases, you may be given additional medicines to help with side effects. Follow all directions for their use. Call your doctor or health care professional for advice if you get a fever, chills or sore throat, or other symptoms of a cold or flu. Do not treat yourself. This drug  decreases your body's ability to fight infections. Try to avoid being around people who are sick. This medicine may increase your risk to bruise or bleed. Call your doctor or health care professional if you notice any unusual bleeding. Be careful brushing and flossing your teeth or using a toothpick because you may get an infection or bleed more easily. If you have any dental work done, tell your dentist you are receiving this medicine. Avoid taking products that contain aspirin, acetaminophen, ibuprofen, naproxen, or ketoprofen unless instructed by your doctor. These medicines may hide a fever. Do not become pregnant while taking this medicine. Women should inform their doctor if they wish to become pregnant or think they might be pregnant. There is a potential for serious side effects to an unborn child. Talk to your health care professional or pharmacist for more information. Do not breast-feed an infant while taking this medicine. What side effects may I notice from receiving this medicine? Side effects that you should report to your doctor or health care professional as soon as possible: -allergic reactions like skin rash, itching or hives, swelling of the face, lips, or tongue -signs of infection - fever or chills, cough, sore throat, pain or difficulty passing urine -signs of decreased platelets or bleeding - bruising, pinpoint red spots on the skin, black, tarry stools, nosebleeds -signs of decreased red blood cells - unusually weak or  tired, fainting spells, lightheadedness -breathing problems -changes in hearing -changes in vision -chest pain -high blood pressure -low blood counts - This drug may decrease the number of white blood cells, red blood cells and platelets. You may be at increased risk for infections and bleeding. -nausea and vomiting -pain, swelling, redness or irritation at the injection site -pain, tingling, numbness in the hands or feet -problems with balance, talking,  walking -trouble passing urine or change in the amount of urine Side effects that usually do not require medical attention (report to your doctor or health care professional if they continue or are bothersome): -hair loss -loss of appetite -metallic taste in the mouth or changes in taste This list may not describe all possible side effects. Call your doctor for medical advice about side effects. You may report side effects to FDA at 1-800-FDA-1088. Where should I keep my medicine? This drug is given in a hospital or clinic and will not be stored at home. NOTE: This sheet is a summary. It may not cover all possible information. If you have questions about this medicine, talk to your doctor, pharmacist, or health care provider.  2014, [employer] Standard. (2008-02-29 14:38:05) Pegfilgrastim injection What is this medicine? PEGFILGRASTIM (peg fil GRA stim) helps the body make more white blood cells. It is used to prevent infection in people with low amounts of white blood cells following cancer treatment. This medicine may be used for other purposes; ask your health care provider or pharmacist if you have questions. COMMON BRAND NAME(S): Neulasta What should I tell my health care provider before I take this medicine? They need to know if you have any of these conditions: -sickle cell disease -an unusual or allergic reaction to pegfilgrastim, filgrastim, E.coli protein, other medicines, foods, dyes, or preservatives -pregnant or trying to get pregnant -breast-feeding How should I use this medicine? This medicine is for injection under the skin. It is usually given by a health care professional in a hospital or clinic setting. If you get this medicine at home, you will be taught how to prepare and give this medicine. Do not shake this medicine. Use exactly as directed. Take your medicine at regular intervals. Do not take your medicine more often than directed. It is important that you put  your used needles and syringes in a special sharps container. Do not put them in a trash can. If you do not have a sharps container, call your pharmacist or healthcare provider to get one. Talk to your pediatrician regarding the use of this medicine in children. While this drug may be prescribed for children who weigh more than 45 kg for selected conditions, precautions do apply Overdosage: If you think you have taken too much of this medicine contact a poison control center or emergency room at once. NOTE: This medicine is only for you. Do not share this medicine with others. What if I miss a dose? If you miss a dose, take it as soon as you can. If it is almost time for your next dose, take only that dose. Do not take double or extra doses. What may interact with this medicine? -lithium -medicines for growth therapy This list may not describe all possible interactions. Give your health care provider a list of all the medicines, herbs, non-prescription drugs, or dietary supplements you use. Also tell them if you smoke, drink alcohol, or use illegal drugs. Some items may interact with your medicine. What should I watch for while using this medicine? Visit your  doctor for regular check ups. You will need important blood work done while you are taking this medicine. What side effects may I notice from receiving this medicine? Side effects that you should report to your doctor or health care professional as soon as possible: -allergic reactions like skin rash, itching or hives, swelling of the face, lips, or tongue -breathing problems -fever -pain, redness, or swelling where injected -shoulder pain -stomach or side pain Side effects that usually do not require medical attention (report to your doctor or health care professional if they continue or are bothersome): -aches, pains -headache -loss of appetite -nausea, vomiting -unusually tired This list may not describe all possible side effects.  Call your doctor for medical advice about side effects. You may report side effects to FDA at 1-800-FDA-1088. Where should I keep my medicine? Keep out of the reach of children. Store in a refrigerator between 2 and 8 degrees C (36 and 46 degrees F). Do not freeze. Keep in carton to protect from light. Throw away this medicine if it is left out of the refrigerator for more than 48 hours. Throw away any unused medicine after the expiration date. NOTE: This sheet is a summary. It may not cover all possible information. If you have questions about this medicine, talk to your doctor, pharmacist, or health care provider.  2014, [employer] Standard. (2008-06-26 15:41:44) Trastuzumab injection for infusion What is this medicine? TRASTUZUMAB (tras TOO zoo mab) is a monoclonal antibody. It targets a protein called HER2. This protein is found in some stomach and breast cancers. This medicine can stop cancer cell growth. This medicine may be used with other cancer treatments. This medicine may be used for other purposes; ask your health care provider or pharmacist if you have questions. COMMON BRAND NAME(S): Herceptin What should I tell my health care provider before I take this medicine? They need to know if you have any of these conditions: -heart disease -heart failure -infection (especially a virus infection such as chickenpox, cold sores, or herpes) -lung or breathing disease, like asthma -recent or ongoing radiation therapy -an unusual or allergic reaction to trastuzumab, benzyl alcohol, or other medications, foods, dyes, or preservatives -pregnant or trying to get pregnant -breast-feeding How should I use this medicine? This drug is given as an infusion into a vein. It is administered in a hospital or clinic by a specially trained health care professional. Talk to your pediatrician regarding the use of this medicine in children. This medicine is not approved for use in children. Overdosage:  If you think you have taken too much of this medicine contact a poison control center or emergency room at once. NOTE: This medicine is only for you. Do not share this medicine with others. What if I miss a dose? It is important not to miss a dose. Call your doctor or health care professional if you are unable to keep an appointment. What may interact with this medicine? -cyclophosphamide -doxorubicin -warfarin This list may not describe all possible interactions. Give your health care provider a list of all the medicines, herbs, non-prescription drugs, or dietary supplements you use. Also tell them if you smoke, drink alcohol, or use illegal drugs. Some items may interact with your medicine. What should I watch for while using this medicine? Visit your doctor for checks on your progress. Report any side effects. Continue your course of treatment even though you feel ill unless your doctor tells you to stop. Call your doctor or health care professional for  advice if you get a fever, chills or sore throat, or other symptoms of a cold or flu. Do not treat yourself. Try to avoid being around people who are sick. You may experience fever, chills and shaking during your first infusion. These effects are usually mild and can be treated with other medicines. Report any side effects during the infusion to your health care professional. Fever and chills usually do not happen with later infusions. What side effects may I notice from receiving this medicine? Side effects that you should report to your doctor or other health care professional as soon as possible: -breathing difficulties -chest pain or palpitations -cough -dizziness or fainting -fever or chills, sore throat -skin rash, itching or hives -swelling of the legs or ankles -unusually weak or tired Side effects that usually do not require medical attention (report to your doctor or other health care professional if they continue or are  bothersome): -loss of appetite -headache -muscle aches -nausea This list may not describe all possible side effects. Call your doctor for medical advice about side effects. You may report side effects to FDA at 1-800-FDA-1088. Where should I keep my medicine? This drug is given in a hospital or clinic and will not be stored at home. NOTE: This sheet is a summary. It may not cover all possible information. If you have questions about this medicine, talk to your doctor, pharmacist, or health care provider.  2014, [employer] Standard. (2009-09-28 13:43:15)

## 2014-04-28 NOTE — Progress Notes (Signed)
Okay per Lisabeth Register, NP to proceed with treatment today after reviewing doppler study. PAC flushed and blood return noted. Patient received Herceptin today without any problems or complications. Will reschedule taxotere and carboplatin next week due to time constraints since patient had delayed treatment time due to doppler study. Reviewed with charge RN Abelina Bachelor and Hilario Quarry. Note left for Sharyn Lull to reschedule patient for chemotherapy next week. Patient called and notified that Sharyn Lull with be calling her Tuesday to reschedule taxotere and carboplatin. Patient very appreciative of call. Cindi Carbon, RN

## 2014-04-28 NOTE — Patient Instructions (Signed)
Cancer Center Discharge Instructions for Patients Receiving Chemotherapy  Today you received the following chemotherapy agents Herceptin.  To help prevent nausea and vomiting after your treatment, we encourage you to take your nausea medication as prescribed.   If you develop nausea and vomiting that is not controlled by your nausea medication, call the clinic.   BELOW ARE SYMPTOMS THAT SHOULD BE REPORTED IMMEDIATELY:  *FEVER GREATER THAN 100.5 F  *CHILLS WITH OR WITHOUT FEVER  NAUSEA AND VOMITING THAT IS NOT CONTROLLED WITH YOUR NAUSEA MEDICATION  *UNUSUAL SHORTNESS OF BREATH  *UNUSUAL BRUISING OR BLEEDING  TENDERNESS IN MOUTH AND THROAT WITH OR WITHOUT PRESENCE OF ULCERS  *URINARY PROBLEMS  *BOWEL PROBLEMS  UNUSUAL RASH Items with * indicate a potential emergency and should be followed up as soon as possible.  Feel free to call the clinic you have any questions or concerns. The clinic phone number is [phone].    

## 2014-04-28 NOTE — Progress Notes (Signed)
*  PRELIMINARY RESULTS* Vascular Ultrasound Left upper extremity venous duplex has been completed.  Preliminary findings: No evidence of DVT or superficial thrombosis.    Called results to Lisabeth Register, NP.   Landry Mellow, RDMS, RVT  04/28/2014, 1:48 PM

## 2014-04-28 NOTE — Progress Notes (Signed)
ID: [name] OB: [birth year]  MR#: [id number]  OEV#:[id number]  PCP: Ricke Hey, MD GYN:   SU: Dr. Brantley Stage OTHER MD:  CHIEF COMPLAINT: 63 y/o woman with right breast cancer receiving adjuvant chemotherapy  BREAST CANCER HISTORY:  Brandy Jackson is a 63 y.o. female. Patient palpated a right breast mass. She had a mammogram performed and she was noted to have a mass in the upper outer quadrant measuring 2.1 x 2.0 x 1.7 cm. 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 Tumor was ER positive PR positive HER-2/neu positive (triple positive) with a proliferation marker Ki-67 at 90%. She had MRI of the breasts performed which showed a solitary mass in the upper outer quadrant measuring 2.5 cm.  CURRENT THERAPY: Docetaxel, Carboplatin, given ond ay 1 of a 21 day cycle, Trastuzumab given on day 1, day 8, and day 15 of a 21 day cycle.  Today is cycle 1 day 1.    INTERVAL HISTORY: Brandy Jackson is here today for evaluation prior to receiving her first cycle of Docetaxel, Carboplatin and Trastuzumab.  She is doing relatively well.  She did have a port a cath placed and tolerated placement relatively well.  She does have some questions regarding her chemotherapy regimen and anti-emetic regimen.  Otherwise, she denies fevers, chills, nausea, vomiting, constipation, diarrhea, numbness, skin changes, or any further concerns.    REVIEW OF SYSTEMS:A 10 point review of systems was conducted and is otherwise negative except for what is noted above.     PAST MEDICAL HISTORY: Past Medical History  Diagnosis Date  . Diabetes mellitus without complication   . Arthritis   . Hot flashes   . Cancer   . Breast cancer   . Wears glasses   . Wears partial dentures     bottom    PAST SURGICAL HISTORY: Past Surgical History  Procedure Laterality Date  . Bladder repair w/ cesarean section    . Colonoscopy    . Portacath placement N/A 04/11/2014    Procedure: INSERTION  PORT-A-CATH;  Surgeon: Joyice Faster. Cornett, MD;  Location: [facility];  Service: General;  Laterality: N/A;    FAMILY HISTORY No family history on file.  GYNECOLOGIC HISTORY: menarche at age 31, G62 P3, went through menopause at age 89. No h/o of HRT, no h/o abnormal pap smears, or sexually transmitted infections. Does have frequent vaginal candida infections due to diabetes that she follows with her gynecologist for.   SOCIAL HISTORY: Married to her husband michael of 36 years. 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 20 pack year tobacco history, quit 1985. No ETOH, or elicit drug use.     ADVANCED DIRECTIVES: not in place   HEALTH MAINTENANCE: History  Substance Use Topics  . Smoking status: Former Smoker -- 1.00 packs/day    Quit date: 03/08/1986  . Smokeless tobacco: Never Used  . Alcohol Use: No      Mammogram: Colonoscopy: Bone Density Scan:  Pap Smear:  Eye Exam:  Vitamin D Level:   Lipid Panel:    Allergies  Allergen Reactions  . Sulfa Antibiotics Rash    Current Outpatient Prescriptions  Medication Sig Dispense Refill  . lidocaine-prilocaine (EMLA) cream Apply topically as needed.  30 g  6  . metFORMIN (GLUCOPHAGE) 500 MG tablet Take 500 mg by mouth 2 (two) times daily with a meal.       No current facility-administered medications  for this visit.    OBJECTIVE: Filed Vitals:   04/28/14 0956  BP: 145/75  Pulse: 80  Temp: 98.3 F (36.8 C)  Resp: 18     Body mass index is 32.1 kg/(m^2).     GENERAL: Patient is a well appearing female in no acute distress HEENT:  Sclerae anicteric.  Oropharynx clear and moist. No ulcerations or evidence of oropharyngeal candidiasis. Neck is supple.  NODES:  No cervical, supraclavicular, or axillary lymphadenopathy palpated.  BREAST EXAM:  Deferred. LUNGS:  Clear to auscultation bilaterally.  No wheezes or rhonchi. HEART:  Regular rate and rhythm. No murmur  appreciated. ABDOMEN:  Soft, nontender.  Positive, normoactive bowel sounds. No organomegaly palpated. MSK:  No focal spinal tenderness to palpation. Full range of motion bilaterally in the upper extremities. EXTREMITIES:  No peripheral edema.   SKIN:  Clear with no obvious rashes or skin changes. No nail dyscrasia. NEURO:  Nonfocal. Well oriented.  Appropriate affect. ECOG FS:1 - Symptomatic but completely ambulatory  LAB RESULTS:  CMP     Component Value Date/Time   NA 141 04/28/2014 0932   NA 142 04/07/2014 1320   K 3.7 04/28/2014 0932   K 4.2 04/07/2014 1320   CL 103 04/07/2014 1320   CO2 22 04/28/2014 0932   CO2 25 04/07/2014 1320   GLUCOSE 293* 04/28/2014 0932   GLUCOSE 126* 04/07/2014 1320   BUN 11.1 04/28/2014 0932   BUN 16 04/07/2014 1320   CREATININE 0.8 04/28/2014 0932   CREATININE 0.56 04/07/2014 1320   CALCIUM 9.5 04/28/2014 0932   CALCIUM 9.4 04/07/2014 1320   PROT 7.4 04/28/2014 0932   PROT 7.8 04/07/2014 1320   ALBUMIN 3.7 04/28/2014 0932   ALBUMIN 3.9 04/07/2014 1320   AST 12 04/28/2014 0932   AST 20 04/07/2014 1320   ALT 14 04/28/2014 0932   ALT 19 04/07/2014 1320   ALKPHOS 69 04/28/2014 0932   ALKPHOS 68 04/07/2014 1320   BILITOT 0.23 04/28/2014 0932   BILITOT 0.2* 04/07/2014 1320   GFRNONAA >90 04/07/2014 1320   GFRAA >90 04/07/2014 1320    I No results found for this basename: SPEP, UPEP,  kappa and lambda light chains    Lab Results  Component Value Date   WBC 7.7 04/28/2014   NEUTROABS 5.8 04/28/2014   HGB 12.8 04/28/2014   HCT 40.6 04/28/2014   MCV 82.1 04/28/2014   PLT 240 04/28/2014      Chemistry      Component Value Date/Time   NA 141 04/28/2014 0932   NA 142 04/07/2014 1320   K 3.7 04/28/2014 0932   K 4.2 04/07/2014 1320   CL 103 04/07/2014 1320   CO2 22 04/28/2014 0932   CO2 25 04/07/2014 1320   BUN 11.1 04/28/2014 0932   BUN 16 04/07/2014 1320   CREATININE 0.8 04/28/2014 0932   CREATININE 0.56 04/07/2014 1320      Component Value Date/Time   CALCIUM 9.5 04/28/2014 0932   CALCIUM  9.4 04/07/2014 1320   ALKPHOS 69 04/28/2014 0932   ALKPHOS 68 04/07/2014 1320   AST 12 04/28/2014 0932   AST 20 04/07/2014 1320   ALT 14 04/28/2014 0932   ALT 19 04/07/2014 1320   BILITOT 0.23 04/28/2014 0932   BILITOT 0.2* 04/07/2014 1320       No results found for this basename: LABCA2    No components found with this basename: LABCA125    No results found for this basename: INR,  in the  last 168 hours  Urinalysis No results found for this basename: colorurine, appearanceur, labspec, phurine, glucoseu, hgbur, bilirubinur, ketonesur, proteinur, urobilinogen, nitrite, leukocytesur    STUDIES: Nm Sentinel Node Inj-no Rpt (breast)  04/11/2014   CLINICAL DATA: RIGHT BREAST CANCER   Sulfur colloid was injected intradermally by the nuclear medicine  technologist for breast cancer sentinel node localization.    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 Radioactive seed localization performed on 04/07/2014. Lumpectomy today 04/11/2014.  EXAM: SPECIMEN RADIOGRAPH OF THE RIGHT BREAST  COMPARISON:  Previous exam(s).  FINDINGS: Status post excision of the right breast. The intact radioactive seed and biopsy marker clip are present and are marked for pathology. This was discussed by telephone with the operating room nurse at the time of interpretation on 04/11/2014 at approximately 10:15 a.m.  IMPRESSION: Specimen radiograph of the right breast demonstrating an intact radioactive seed marker, the ribbon shaped tissue marker clip and the entirety of the mass.   Electronically Signed   By: Evangeline Dakin M.D.   On: 04/11/2014 10:32   Dg Chest Port 1 View  04/11/2014   CLINICAL DATA:  Status post line placement  EXAM: PORTABLE CHEST - 1 VIEW  COMPARISON:  07/06/2007  FINDINGS: A left-sided chest wall port is noted. The catheter tip is noted at the cavoatrial junction. The overall  inspiratory effort is poor with bibasilar infiltrative changes. Clinical correlation is recommended. No pneumothorax is noted.  IMPRESSION: No pneumothorax following port placement.  Bibasilar infiltrates which may be in part due to the poor inspiratory effort.  These results were called by telephone at the time of interpretation on 04/11/2014 at 12:20 PM to Playita Cortada, the patients nurse, who verbally acknowledged these results.   Electronically Signed   By: Inez Catalina M.D.   On: 04/11/2014 12:19   Dg Fluoro Guide Cv Line-no Report  04/11/2014   CLINICAL DATA: portacath insertion   FLOURO GUIDE CV LINE  Fluoroscopy was utilized by the requesting physician.  No radiographic  interpretation.    [place] Rt Plc Breast Loc Dev   1st Lesion  Inc US Guide  04/07/2014   CLINICAL DATA:  Patient presents for ultrasound-guided radioactive seed was patient of a right breast carcinoma, previously biopsied.  EXAM: ULTRASOUND GUIDED RADIOACTIVE SEED LOCALIZATION OF THE RIGHT BREAST  COMPARISON:  Previous exam(s)  FINDINGS: Patient presents for radioactive seed localization prior to surgical excision. I met with the patient and we discussed the procedure of seed localization including benefits and alternatives. We discussed the high likelihood of a successful procedure. We discussed the risks of the procedure including infection, bleeding, tissue injury and further surgery. We discussed the low dose of radioactivity involved in the procedure. Informed, written consent was given.  The usual time-out protocol was performed immediately prior to the procedure.  Using ultrasound guidance, sterile technique, 2% lidocaine and an I-125 radioactive seed, the lateral right breast mass was localized using a inferolateral approach. The follow-up mammogram images confirm the seed in the expected location and are marked for Dr. Brantley Stage. Follow-up survey of the patient confirms presence of radioactive seed.  Order number of I-125 seed:  [id number].   Dose of I-125 seed:  0.250 mCi.  The patient tolerated the procedure well and was released from the [facility]. She was given instructions regarding seed removal.  IMPRESSION: Radioactive seed localization right breast. No apparent complications.  Electronically Signed   By: Lajean Manes M.D.   On: 04/07/2014 13:11    ASSESSMENT: 63 y.o. [employer], [place] woman with right breast T2 N1, invasive ductal carcinoma, grade III, ER 100%, PR negative, Ki-67 94%, HER-2/neu positive.   1. Patient underwent a right breast lumpectomy that revealed a grade III, 2.2 cm invasive ductal carcinoma. Her invasive tumor is 0.5cm to the superior margin. The tumor was positive for lymphovascular invasion. 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 One lymph node was positive for metastatic mammary carcinoma.    PLAN:   Brandy Jackson doing well today.  I reviewed her lab work with her which were all stable.  I prescribed her anti-emetics and EMLA cream today.  I reviewed her chemotherapy regimen with her in detail, and gave her detailed information about each chemotherapy agent along with the Trastuzumab and Neulasta.  I recommended she take Claritin and Tylenol daily beginning tomorrow.  I reviewed her anti-emetic regimen with her in detail.  I highlighted the information in her AVS and provided her with a copy to review.    The patient underwent an echocardiogram on 04/05/14.  Her LVEF was 56%.  She was also evaluated by Dr. BHaroldine Lawsand educated on cardiotoxicity and Trastuzumab therapy.  The patient was cleared to proceed with Trastuzumab with 3 month follow up with echocardiogram recommended.   The patient verbalized understanding of her treatment plan and will proceed with cycle 1 day 1 of Docetaxel, Carboplatin, and Trastuzumab today.  She will return tomorrow for Neulasta and in one week for labs, evaluation of chemotoxicities, and Trastuzumab alone.   She knows to  call [facility] the interim for any questions or concerns.  We can certainly see her sooner if needed.  I spent 40 minutes counseling the patient face to face.  The total time spent in the appointment was 50 minutes.   LMinette Headland [phone]/22/2015 10:13 AM

## 2014-04-28 NOTE — Progress Notes (Signed)
Accessed patient's PAC with 1 inch huber needle. Slight tinge of blood noted when flushed. Second RN assessed site and was unable to get blood return. Patient repositioned and still unable to get blood return. PAC deaccessed and 1.5 inch needle used to reaccess PAC. Blood return noted with 1.5 inch needle. After normal saline running at 15mL/hr for several minutes, patient noted tingling in left hand. She also stated the left side of her neck has been sore for 2-3 days. Lisabeth Register, NP notified and received orders for vascular doppler to rule out blood clot. Patient and husband walked over by NT to registration at [facility] to check in for doppler. Patient told to return to infusion area after doppler completed. Cindi Carbon, RN

## 2014-04-29 ENCOUNTER — Ambulatory Visit: Payer: BC Managed Care – PPO

## 2014-05-02 ENCOUNTER — Ambulatory Visit (HOSPITAL_BASED_OUTPATIENT_CLINIC_OR_DEPARTMENT_OTHER): Payer: BC Managed Care – PPO

## 2014-05-02 ENCOUNTER — Telehealth: Payer: Self-pay | Admitting: *Deleted

## 2014-05-02 ENCOUNTER — Other Ambulatory Visit: Payer: Self-pay | Admitting: Oncology

## 2014-05-02 ENCOUNTER — Other Ambulatory Visit (INDEPENDENT_AMBULATORY_CARE_PROVIDER_SITE_OTHER): Payer: Self-pay | Admitting: Surgery

## 2014-05-02 ENCOUNTER — Other Ambulatory Visit: Payer: Self-pay

## 2014-05-02 VITALS — BP 157/72 | HR 75 | Temp 98.2°F | Resp 18

## 2014-05-02 DIAGNOSIS — Z5111 Encounter for antineoplastic chemotherapy: Secondary | ICD-10-CM

## 2014-05-02 DIAGNOSIS — C50411 Malignant neoplasm of upper-outer quadrant of right female breast: Secondary | ICD-10-CM

## 2014-05-02 DIAGNOSIS — C50419 Malignant neoplasm of upper-outer quadrant of unspecified female breast: Secondary | ICD-10-CM

## 2014-05-02 MED ORDER — SODIUM CHLORIDE 0.9 % IJ SOLN
10.0000 mL | INTRAMUSCULAR | Status: DC | PRN
  Administered 2014-05-02: 10 mL
  Filled 2014-05-02: qty 10

## 2014-05-02 MED ORDER — ONDANSETRON 16 MG/50ML IVPB (CHCC)
INTRAVENOUS | Status: AC
  Filled 2014-05-02: qty 16

## 2014-05-02 MED ORDER — DOCETAXEL CHEMO INJECTION 160 MG/16ML
75.0000 mg/m2 | Freq: Once | INTRAVENOUS | Status: AC
  Administered 2014-05-02: 160 mg via INTRAVENOUS
  Filled 2014-05-02: qty 16

## 2014-05-02 MED ORDER — SODIUM CHLORIDE 0.9 % IV SOLN
793.2000 mg | Freq: Once | INTRAVENOUS | Status: AC
  Administered 2014-05-02: 790 mg via INTRAVENOUS
  Filled 2014-05-02: qty 79

## 2014-05-02 MED ORDER — SODIUM CHLORIDE 0.9 % IV SOLN
Freq: Once | INTRAVENOUS | Status: AC
  Administered 2014-05-02: 13:00:00 via INTRAVENOUS

## 2014-05-02 MED ORDER — HEPARIN SOD (PORK) LOCK FLUSH 100 UNIT/ML IV SOLN
500.0000 [IU] | Freq: Once | INTRAVENOUS | Status: AC | PRN
  Administered 2014-05-02: 500 [IU]
  Filled 2014-05-02: qty 5

## 2014-05-02 MED ORDER — DEXAMETHASONE SODIUM PHOSPHATE 20 MG/5ML IJ SOLN
INTRAMUSCULAR | Status: AC
  Filled 2014-05-02: qty 5

## 2014-05-02 MED ORDER — DEXAMETHASONE SODIUM PHOSPHATE 20 MG/5ML IJ SOLN
20.0000 mg | Freq: Once | INTRAMUSCULAR | Status: AC
  Administered 2014-05-02: 20 mg via INTRAVENOUS

## 2014-05-02 MED ORDER — ONDANSETRON 16 MG/50ML IVPB (CHCC)
16.0000 mg | Freq: Once | INTRAVENOUS | Status: AC
  Administered 2014-05-02: 16 mg via INTRAVENOUS

## 2014-05-02 NOTE — Progress Notes (Signed)
I CALLED PT TODAY AND LMOM.  AFTER DISCUSSION ATMDC,  RE EXCISION OF CLOSE MARGIN RECOMMENDED BY [place] AFTER DISCUSSION.  I HAVE CALLED TWICE TODAY AND HOPEFULLY SHE WILL CALL BACK SOON.

## 2014-05-02 NOTE — Patient Instructions (Signed)
Cancer Center Discharge Instructions for Patients Receiving Chemotherapy  Today you received the following chemotherapy agents:  Taxotere and Carboplatin  To help prevent nausea and vomiting after your treatment, we encourage you to take your nausea medication as ordered per MD.   If you develop nausea and vomiting that is not controlled by your nausea medication, call the clinic.   BELOW ARE SYMPTOMS THAT SHOULD BE REPORTED IMMEDIATELY:  *FEVER GREATER THAN 100.5 F  *CHILLS WITH OR WITHOUT FEVER  NAUSEA AND VOMITING THAT IS NOT CONTROLLED WITH YOUR NAUSEA MEDICATION  *UNUSUAL SHORTNESS OF BREATH  *UNUSUAL BRUISING OR BLEEDING  TENDERNESS IN MOUTH AND THROAT WITH OR WITHOUT PRESENCE OF ULCERS  *URINARY PROBLEMS  *BOWEL PROBLEMS  UNUSUAL RASH Items with * indicate a potential emergency and should be followed up as soon as possible.  Feel free to call the clinic you have any questions or concerns. The clinic phone number is [phone].    

## 2014-05-02 NOTE — Telephone Encounter (Signed)
Called to inform pt that she will need to be seen on Friday as scheduled. No answer, but left a detailed message. Any other questions concerning her schedule I told her we can discuss them at her appt on Friday. Message to be forwarded to Charlestine Massed, NP.

## 2014-05-03 ENCOUNTER — Telehealth: Payer: Self-pay | Admitting: *Deleted

## 2014-05-03 ENCOUNTER — Ambulatory Visit (HOSPITAL_BASED_OUTPATIENT_CLINIC_OR_DEPARTMENT_OTHER): Payer: BC Managed Care – PPO

## 2014-05-03 ENCOUNTER — Telehealth (INDEPENDENT_AMBULATORY_CARE_PROVIDER_SITE_OTHER): Payer: Self-pay

## 2014-05-03 VITALS — BP 145/68 | HR 72 | Temp 98.2°F

## 2014-05-03 DIAGNOSIS — Z5189 Encounter for other specified aftercare: Secondary | ICD-10-CM

## 2014-05-03 DIAGNOSIS — C50411 Malignant neoplasm of upper-outer quadrant of right female breast: Secondary | ICD-10-CM

## 2014-05-03 DIAGNOSIS — C50419 Malignant neoplasm of upper-outer quadrant of unspecified female breast: Secondary | ICD-10-CM

## 2014-05-03 MED ORDER — PEGFILGRASTIM INJECTION 6 MG/0.6ML
6.0000 mg | Freq: Once | SUBCUTANEOUS | Status: AC
  Administered 2014-05-03: 6 mg via SUBCUTANEOUS
  Filled 2014-05-03: qty 0.6

## 2014-05-03 NOTE — Telephone Encounter (Signed)
Called CHRISMA HURLOCK at [phone] (home) number(s).  Message left requesting a return call for chemotherapy follow up.  Awaiting return call from patient.  Patient was here for injection and doing well thus far.

## 2014-05-03 NOTE — Progress Notes (Signed)
Patient had first Carbo/Taxotere yesterday.  Patient denies any fever or nausea and vomiting. Patient states she is still having pain in neck and jaw.   Denies any SOB or diaphoresis. She has a follow up appt with Charlestine Massed, NP on 05/05/14. Advised Lindsey's nurse and per Lannette Donath, RN  patient is ok to go home unless she has any of these symptoms. States she will go to the ER if  she has any of these symptoms or her current symptoms get worse.

## 2014-05-03 NOTE — Telephone Encounter (Signed)
Called pt to inform that her scheduled Herceptin will be on 5/29 and she will come to labs and then will see NP before Tx. Pt had some concerns about future treatments/ times. I told her we will discuss those issues when she comes on Friday. Pt verbalized understanding. Message to be forwarded to Charlestine Massed, NP.

## 2014-05-03 NOTE — Telephone Encounter (Signed)
Called and spoke to pt. Explained the need for re excision. Pt states she understands now. She has appt with oncology today. I will have our schedulers call her with surgery information.

## 2014-05-03 NOTE — Patient Instructions (Signed)

## 2014-05-03 NOTE — Telephone Encounter (Signed)
Message copied by Cherylynn Ridges on Wed May 03, 2014  5:07 PM ------      Message from: Christa See      Created: Tue May 02, 2014  4:10 PM      Regarding: 1st time chemo f/u call      Contact: [phone]       Pt had first time herceptin Friday and then first time carbo/taxotere on Tuesday (couldn't get it all Friday due to time constraint). Pt sees [employer]. Can you see how she is? Thanks! Kristen  ------

## 2014-05-03 NOTE — Telephone Encounter (Signed)
Husband returning DR. Cornetts call concerning wife's surgery please call .

## 2014-05-04 NOTE — Telephone Encounter (Signed)
Sounds good, LC

## 2014-05-05 ENCOUNTER — Ambulatory Visit (HOSPITAL_BASED_OUTPATIENT_CLINIC_OR_DEPARTMENT_OTHER): Payer: BC Managed Care – PPO | Admitting: Adult Health

## 2014-05-05 ENCOUNTER — Telehealth (INDEPENDENT_AMBULATORY_CARE_PROVIDER_SITE_OTHER): Payer: Self-pay

## 2014-05-05 ENCOUNTER — Telehealth: Payer: Self-pay | Admitting: Oncology

## 2014-05-05 ENCOUNTER — Encounter: Payer: Self-pay | Admitting: Adult Health

## 2014-05-05 ENCOUNTER — Other Ambulatory Visit: Payer: Self-pay | Admitting: Oncology

## 2014-05-05 ENCOUNTER — Other Ambulatory Visit (HOSPITAL_BASED_OUTPATIENT_CLINIC_OR_DEPARTMENT_OTHER): Payer: BC Managed Care – PPO

## 2014-05-05 ENCOUNTER — Other Ambulatory Visit: Payer: Self-pay | Admitting: *Deleted

## 2014-05-05 ENCOUNTER — Ambulatory Visit (HOSPITAL_BASED_OUTPATIENT_CLINIC_OR_DEPARTMENT_OTHER): Payer: BC Managed Care – PPO

## 2014-05-05 VITALS — BP 148/80 | HR 86 | Temp 98.2°F | Resp 20 | Ht 67.0 in | Wt 205.8 lb

## 2014-05-05 DIAGNOSIS — Z170 Estrogen receptor positive status [ER+]: Secondary | ICD-10-CM

## 2014-05-05 DIAGNOSIS — C50419 Malignant neoplasm of upper-outer quadrant of unspecified female breast: Secondary | ICD-10-CM

## 2014-05-05 DIAGNOSIS — Z5112 Encounter for antineoplastic immunotherapy: Secondary | ICD-10-CM

## 2014-05-05 DIAGNOSIS — C50411 Malignant neoplasm of upper-outer quadrant of right female breast: Secondary | ICD-10-CM

## 2014-05-05 LAB — CBC WITH DIFFERENTIAL/PLATELET
BASO%: 1.1 % (ref 0.0–2.0)
BASOS ABS: 0.3 10*3/uL — AB (ref 0.0–0.1)
EOS%: 0 % (ref 0.0–7.0)
Eosinophils Absolute: 0 10*3/uL (ref 0.0–0.5)
HEMATOCRIT: 38.3 % (ref 34.8–46.6)
HEMOGLOBIN: 12 g/dL (ref 11.6–15.9)
LYMPH#: 1.7 10*3/uL (ref 0.9–3.3)
LYMPH%: 6.5 % — ABNORMAL LOW (ref 14.0–49.7)
MCH: 25.8 pg (ref 25.1–34.0)
MCHC: 31.2 g/dL — AB (ref 31.5–36.0)
MCV: 82.7 fL (ref 79.5–101.0)
MONO#: 0.3 10*3/uL (ref 0.1–0.9)
MONO%: 1.2 % (ref 0.0–14.0)
NEUT#: 23.8 10*3/uL — ABNORMAL HIGH (ref 1.5–6.5)
NEUT%: 91.2 % — AB (ref 38.4–76.8)
Platelets: 187 10*3/uL (ref 145–400)
RBC: 4.63 10*6/uL (ref 3.70–5.45)
RDW: 14.4 % (ref 11.2–14.5)
WBC: 26.1 10*3/uL — ABNORMAL HIGH (ref 3.9–10.3)

## 2014-05-05 LAB — COMPREHENSIVE METABOLIC PANEL (CC13)
ALT: 15 U/L (ref 0–55)
AST: 16 U/L (ref 5–34)
Albumin: 3.7 g/dL (ref 3.5–5.0)
Alkaline Phosphatase: 75 U/L (ref 40–150)
Anion Gap: 13 mEq/L — ABNORMAL HIGH (ref 3–11)
BUN: 15.2 mg/dL (ref 7.0–26.0)
CO2: 20 mEq/L — ABNORMAL LOW (ref 22–29)
Calcium: 9.4 mg/dL (ref 8.4–10.4)
Chloride: 106 mEq/L (ref 98–109)
Creatinine: 0.8 mg/dL (ref 0.6–1.1)
Glucose: 299 mg/dl — ABNORMAL HIGH (ref 70–140)
Potassium: 3.8 mEq/L (ref 3.5–5.1)
Sodium: 139 mEq/L (ref 136–145)
Total Bilirubin: 0.29 mg/dL (ref 0.20–1.20)
Total Protein: 7.1 g/dL (ref 6.4–8.3)

## 2014-05-05 MED ORDER — HEPARIN SOD (PORK) LOCK FLUSH 100 UNIT/ML IV SOLN
500.0000 [IU] | Freq: Once | INTRAVENOUS | Status: AC | PRN
  Administered 2014-05-05: 500 [IU]
  Filled 2014-05-05: qty 5

## 2014-05-05 MED ORDER — SODIUM CHLORIDE 0.9 % IJ SOLN
10.0000 mL | INTRAMUSCULAR | Status: DC | PRN
  Administered 2014-05-05: 10 mL
  Filled 2014-05-05: qty 10

## 2014-05-05 MED ORDER — DIPHENHYDRAMINE HCL 25 MG PO CAPS
50.0000 mg | ORAL_CAPSULE | Freq: Once | ORAL | Status: AC
  Administered 2014-05-05: 50 mg via ORAL

## 2014-05-05 MED ORDER — ACETAMINOPHEN 325 MG PO TABS
ORAL_TABLET | ORAL | Status: AC
  Filled 2014-05-05: qty 2

## 2014-05-05 MED ORDER — SODIUM CHLORIDE 0.9 % IV SOLN
Freq: Once | INTRAVENOUS | Status: AC
  Administered 2014-05-05: 12:00:00 via INTRAVENOUS

## 2014-05-05 MED ORDER — DIPHENHYDRAMINE HCL 25 MG PO CAPS
ORAL_CAPSULE | ORAL | Status: AC
  Filled 2014-05-05: qty 2

## 2014-05-05 MED ORDER — ACETAMINOPHEN 325 MG PO TABS: 650.0000 mg | ORAL_TABLET | Freq: Once | ORAL | Status: DC

## 2014-05-05 MED ORDER — TRASTUZUMAB CHEMO INJECTION 440 MG
2.0000 mg/kg | Freq: Once | INTRAVENOUS | Status: AC
  Administered 2014-05-05: 189 mg via INTRAVENOUS
  Filled 2014-05-05: qty 9

## 2014-05-05 NOTE — Telephone Encounter (Signed)
moved 6/5 mand 6/12 appts to PM. per 5/29 pof GM 6/5 @ 3:30pm - GM blocked for appt 6/5 @ 3:30pm. per LC move to [place] in PM. pt given new schedule june thru aug. only 6/5 and 6/12 changed other appts remain the same.

## 2014-05-05 NOTE — Patient Instructions (Addendum)
Your labs are stable.  You will proceed with Herceptin.  Use tea tree oil for your fingernails.  Please call us if you have any questions or concerns.    You can take imodium for diarrhea.  Do not take more than 16mg  in 24 hours.     Diarrhea Diarrhea is frequent loose and watery bowel movements. It can cause you to feel weak and dehydrated. Dehydration can cause you to become tired and thirsty, have a dry mouth, and have decreased urination that often is dark yellow. Diarrhea is a sign of another problem, most often an infection that will not last long. In most cases, diarrhea typically lasts 2 3 days. However, it can last longer if it is a sign of something more serious. It is important to treat your diarrhea as directed by your caregive to lessen or prevent future episodes of diarrhea. CAUSES  Some common causes include:  Gastrointestinal infections caused by viruses, bacteria, or parasites.  Food poisoning or food allergies.  Certain medicines, such as antibiotics, chemotherapy, and laxatives.  Artificial sweeteners and fructose.  Digestive disorders. HOME CARE INSTRUCTIONS  Ensure adequate fluid intake (hydration): have 1 cup (8 oz) of fluid for each diarrhea episode. Avoid fluids that contain simple sugars or sports drinks, fruit juices, whole milk products, and sodas. Your urine should be clear or pale yellow if you are drinking enough fluids. Hydrate with an oral rehydration solution that you can purchase at pharmacies, retail stores, and online. You can prepare an oral rehydration solution at home by mixing the following ingredients together:    tsp table salt.   tsp baking soda.   tsp salt substitute containing potassium chloride.  1  tablespoons sugar.  1 L (34 oz) of water.  Certain foods and beverages may increase the speed at which food moves through the gastrointestinal (GI) tract. These foods and beverages should be avoided and include:  Caffeinated and alcoholic  beverages.  High-fiber foods, such as raw fruits and vegetables, nuts, seeds, and whole grain breads and cereals.  Foods and beverages sweetened with sugar alcohols, such as xylitol, sorbitol, and mannitol.  Some foods may be well tolerated and may help thicken stool including:  Starchy foods, such as rice, toast, pasta, low-sugar cereal, oatmeal, grits, baked potatoes, crackers, and bagels.  Bananas.  Applesauce.  Add probiotic-rich foods to help increase healthy bacteria in the GI tract, such as yogurt and fermented milk products.  Wash your hands well after each diarrhea episode.  Only take over-the-counter or prescription medicines as directed by your caregiver.  Take a warm bath to relieve any burning or pain from frequent diarrhea episodes. SEEK IMMEDIATE MEDICAL CARE IF:   You are unable to keep fluids down.  You have persistent vomiting.  You have blood in your stool, or your stools are black and tarry.  You do not urinate in 6 8 hours, or there is only a small amount of very dark urine.  You have abdominal pain that increases or localizes.  You have weakness, dizziness, confusion, or lightheadedness.  You have a severe headache.  Your diarrhea gets worse or does not get better.  You have a fever or persistent symptoms for more than 2 3 days.  You have a fever and your symptoms suddenly get worse. MAKE SURE YOU:   Understand these instructions.  Will watch your condition.  Will get help right away if you are not doing well or get worse. Document Released: 11/14/2002 Document Revised: 11/10/2012  Document Reviewed: 08/01/2012 [facility] Patient Information 2014 [place].

## 2014-05-05 NOTE — Progress Notes (Signed)
ID: [name] OB: [birth year]  MR#: [id number]  GHW#:[id number]  PCP: Ricke Hey, MD GYN:   SU: Dr. Brantley Stage OTHER MD:  CHIEF COMPLAINT: 63 y/o woman with right breast cancer receiving adjuvant chemotherapy  BREAST CANCER HISTORY:  Brandy Jackson is a 63 y.o. female. Patient palpated a right breast mass. She had a mammogram performed and she was noted to have a mass in the upper outer quadrant measuring 2.1 x 2.0 x 1.7 cm. 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 Tumor was ER positive PR positive HER-2/neu positive (triple positive) with a proliferation marker Ki-67 at 90%. She had MRI of the breasts performed which showed a solitary mass in the upper outer quadrant measuring 2.5 cm. Her case was discussed at the multidisciplinary breast conference. Radiology and pathology were reviewed. She herself is without any complaints. She does have a little bit of tenderness at the biopsy site. She is accompanied by her husband today.   CURRENT THERAPY: Docetaxel, Carboplatin, given on day 1 of a 21 day cycle Traztuzumab given day 1, day 8 and day 15 of a 21 day cycle.  Cycle 1 day 8.    INTERVAL HISTORY:   Brandy Jackson is here today for evaluation after receiving her first cycle of Docetaxel, Carboplatin, and Trastuzumab.  She did have some pain last week when her port was accessed and reported pain in her neck following port placement.  A doppler was negative for a clot.  She did have mild nausea that was relieved with Ondansetron.  She did experience diarrhea, she is having one episode approximately 5-7 times per day.  It is very loose, non watery, no blood or pus present.  She has not taken any imodium for this.  She denies dizziness, fevers, chills, vomiting, constipation, numbness/tingling, fingernail changes, skin changes, mouth pain or ulcers, or any further concerns.    REVIEW OF SYSTEMS: A 10 point review of systems was conducted and is otherwise  negative except for what is noted above.     PAST MEDICAL HISTORY: Past Medical History  Diagnosis Date  . Diabetes mellitus without complication   . Arthritis   . Hot flashes   . Cancer   . Breast cancer   . Wears glasses   . Wears partial dentures     bottom    PAST SURGICAL HISTORY: Past Surgical History  Procedure Laterality Date  . Bladder repair w/ cesarean section    . Colonoscopy    . Portacath placement N/A 04/11/2014    Procedure: INSERTION PORT-A-CATH;  Surgeon: Joyice Faster. Cornett, MD;  Location: [facility];  Service: General;  Laterality: N/A;    FAMILY HISTORY No family history on file.  GYNECOLOGIC HISTORY: menarche at age 54, G73 P3, went through menopause at age 2.  No h/o of HRT, no h/o abnormal pap smears, or sexually transmitted infections.  Does have frequent vaginal candida infections due to diabetes that she follows with her gynecologist for.    SOCIAL HISTORY: Married to her husband michael of 36 years.  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  20 pack year tobacco history, quit 1985.  No ETOH, or elicit drug use.      ADVANCED DIRECTIVES: not in place.    HEALTH MAINTENANCE: History  Substance Use Topics  . Smoking status: Former Smoker -- 1.00 packs/day    Quit date: 03/08/1986  . Smokeless tobacco: Never Used  .  Alcohol Use: No      Mammogram: Colonoscopy: Bone Density Scan:  Pap Smear:  Eye Exam:  Vitamin D Level:   Lipid Panel:    Allergies  Allergen Reactions  . Sulfa Antibiotics Rash    Current Outpatient Prescriptions  Medication Sig Dispense Refill  . lidocaine-prilocaine (EMLA) cream Apply topically as needed.  30 g  6  . LORazepam (ATIVAN) 0.5 MG tablet Take 1 tablet (0.5 mg total) by mouth every 8 (eight) hours.  30 tablet  0  . metFORMIN (GLUCOPHAGE) 500 MG tablet Take 500 mg by mouth 2 (two) times daily with a meal.      . dexamethasone (DECADRON) 4 MG  tablet Take 2 tablets (8 mg total) by mouth 2 (two) times daily. Take the day before chemo, then the day after chemo x 3 days.  30 tablet  4  . ondansetron (ZOFRAN) 8 MG tablet Take 1 tablet (8 mg total) by mouth 2 (two) times daily. Take starting the day after chemo for three days, then take twice a day PRN.  20 tablet  0  . prochlorperazine (COMPAZINE) 10 MG tablet Take 1 tablet (10 mg total) by mouth every 6 (six) hours as needed for nausea or vomiting.  30 tablet  0   No current facility-administered medications for this visit.    OBJECTIVE: Filed Vitals:   05/05/14 0940  BP: 148/80  Pulse: 86  Temp: 98.2 F (36.8 C)  Resp: 20     Body mass index is 32.23 kg/(m^2).     GENERAL: Patient is a well appearing female in no acute distress HEENT:  Sclerae anicteric.  Oropharynx clear and moist. No ulcerations or evidence of oropharyngeal candidiasis. Neck is supple.  NODES:  No cervical, supraclavicular, or axillary lymphadenopathy palpated.  BREAST EXAM:  Deferred. LUNGS:  Clear to auscultation bilaterally.  No wheezes or rhonchi. HEART:  Regular rate and rhythm. No murmur appreciated. ABDOMEN:  Soft, nontender.  Positive, normoactive bowel sounds. No organomegaly palpated. MSK:  No focal spinal tenderness to palpation. Full range of motion bilaterally in the upper extremities. EXTREMITIES:  No peripheral edema.   SKIN:  Clear with no obvious rashes or skin changes. No nail dyscrasia. NEURO:  Nonfocal. Well oriented.  Appropriate affect. ECOG FS:1 - Symptomatic but completely ambulatory  LAB RESULTS:  CMP     Component Value Date/Time   NA 139 05/05/2014 0928   NA 142 04/07/2014 1320   K 3.8 05/05/2014 0928   K 4.2 04/07/2014 1320   CL 103 04/07/2014 1320   CO2 20* 05/05/2014 0928   CO2 25 04/07/2014 1320   GLUCOSE 299* 05/05/2014 0928   GLUCOSE 126* 04/07/2014 1320   BUN 15.2 05/05/2014 0928   BUN 16 04/07/2014 1320   CREATININE 0.8 05/05/2014 0928   CREATININE 0.56 04/07/2014 1320    CALCIUM 9.4 05/05/2014 0928   CALCIUM 9.4 04/07/2014 1320   PROT 7.1 05/05/2014 0928   PROT 7.8 04/07/2014 1320   ALBUMIN 3.7 05/05/2014 0928   ALBUMIN 3.9 04/07/2014 1320   AST 16 05/05/2014 0928   AST 20 04/07/2014 1320   ALT 15 05/05/2014 0928   ALT 19 04/07/2014 1320   ALKPHOS 75 05/05/2014 0928   ALKPHOS 68 04/07/2014 1320   BILITOT 0.29 05/05/2014 0928   BILITOT 0.2* 04/07/2014 1320   GFRNONAA >90 04/07/2014 1320   GFRAA >90 04/07/2014 1320    I No results found for this basename: SPEP,  UPEP,   kappa and lambda  light chains    Lab Results  Component Value Date   WBC 26.1* 05/05/2014   NEUTROABS 23.8* 05/05/2014   HGB 12.0 05/05/2014   HCT 38.3 05/05/2014   MCV 82.7 05/05/2014   PLT 187 05/05/2014      Chemistry      Component Value Date/Time   NA 139 05/05/2014 0928   NA 142 04/07/2014 1320   K 3.8 05/05/2014 0928   K 4.2 04/07/2014 1320   CL 103 04/07/2014 1320   CO2 20* 05/05/2014 0928   CO2 25 04/07/2014 1320   BUN 15.2 05/05/2014 0928   BUN 16 04/07/2014 1320   CREATININE 0.8 05/05/2014 0928   CREATININE 0.56 04/07/2014 1320      Component Value Date/Time   CALCIUM 9.4 05/05/2014 0928   CALCIUM 9.4 04/07/2014 1320   ALKPHOS 75 05/05/2014 0928   ALKPHOS 68 04/07/2014 1320   AST 16 05/05/2014 0928   AST 20 04/07/2014 1320   ALT 15 05/05/2014 0928   ALT 19 04/07/2014 1320   BILITOT 0.29 05/05/2014 0928   BILITOT 0.2* 04/07/2014 1320       No results found for this basename: LABCA2    No components found with this basename: LABCA125    No results found for this basename: INR,  in the last 168 hours  Urinalysis No results found for this basename: colorurine,  appearanceur,  labspec,  phurine,  glucoseu,  hgbur,  bilirubinur,  ketonesur,  proteinur,  urobilinogen,  nitrite,  leukocytesur    STUDIES: Nm Sentinel Node Inj-no Rpt (breast)  04/11/2014   CLINICAL DATA: RIGHT BREAST CANCER   Sulfur colloid was injected intradermally by the nuclear medicine  technologist for breast cancer sentinel node  localization.    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 Radioactive seed localization performed on 04/07/2014. Lumpectomy today 04/11/2014.  EXAM: SPECIMEN RADIOGRAPH OF THE RIGHT BREAST  COMPARISON:  Previous exam(s).  FINDINGS: Status post excision of the right breast. The intact radioactive seed and biopsy marker clip are present and are marked for pathology. This was discussed by telephone with the operating room nurse at the time of interpretation on 04/11/2014 at approximately 10:15 a.m.  IMPRESSION: Specimen radiograph of the right breast demonstrating an intact radioactive seed marker, the ribbon shaped tissue marker clip and the entirety of the mass.   Electronically Signed   By: Evangeline Dakin M.D.   On: 04/11/2014 10:32   Dg Chest Port 1 View  04/11/2014   CLINICAL DATA:  Status post line placement  EXAM: PORTABLE CHEST - 1 VIEW  COMPARISON:  07/06/2007  FINDINGS: A left-sided chest wall port is noted. The catheter tip is noted at the cavoatrial junction. The overall inspiratory effort is poor with bibasilar infiltrative changes. Clinical correlation is recommended. No pneumothorax is noted.  IMPRESSION: No pneumothorax following port placement.  Bibasilar infiltrates which may be in part due to the poor inspiratory effort.  These results were called by telephone at the time of interpretation on 04/11/2014 at 12:20 PM to [place], the patients nurse, who verbally acknowledged these results.   Electronically Signed   By: Inez Catalina M.D.   On: 04/11/2014 12:19   Dg Fluoro Guide Cv Line-no Report  04/11/2014   CLINICAL DATA: portacath insertion   FLOURO GUIDE CV LINE  Fluoroscopy was utilized by the requesting physician.  No radiographic  interpretation.    [place] Rt Plc Breast Loc Dev   1st Lesion  Inc US Guide  04/07/2014   CLINICAL DATA:  Patient presents for  ultrasound-guided radioactive seed was patient of a right breast carcinoma, previously biopsied.  EXAM: ULTRASOUND GUIDED RADIOACTIVE SEED LOCALIZATION OF THE RIGHT BREAST  COMPARISON:  Previous exam(s)  FINDINGS: Patient presents for radioactive seed localization prior to surgical excision. I met with the patient and we discussed the procedure of seed localization including benefits and alternatives. We discussed the high likelihood of a successful procedure. We discussed the risks of the procedure including infection, bleeding, tissue injury and further surgery. We discussed the low dose of radioactivity involved in the procedure. Informed, written consent was given.  The usual time-out protocol was performed immediately prior to the procedure.  Using ultrasound guidance, sterile technique, 2% lidocaine and an I-125 radioactive seed, the lateral right breast mass was localized using a inferolateral approach. The follow-up mammogram images confirm the seed in the expected location and are marked for Dr. Brantley Stage. Follow-up survey of the patient confirms presence of radioactive seed.  Order number of I-125 seed:  [id number].  Dose of I-125 seed:  0.250 mCi.  The patient tolerated the procedure well and was released from the [facility]. She was given instructions regarding seed removal.  IMPRESSION: Radioactive seed localization right breast. No apparent complications.   Electronically Signed   By: Lajean Manes M.D.   On: 04/07/2014 13:11    ASSESSMENT: 63 y.o. [employer], [place] woman with right breast T2 N1, invasive ductal carcinoma, grade III, ER 100%, PR negative, Ki-67 94%, HER-2/neu positive.    1. Patient underwent a right breast lumpectomy that revealed a grade III, 2.2 cm invasive ductal carcinoma.  Her invasive tumor is 0.5cm to the superior margin.  The tumor was positive for lymphovascular invasion.  DCIS was present.  The in situ carcinoma was 46m from the lateral and superior margins.  One lymph  node was positive for metastatic mammary carcinoma.    PLAN:   Ms. PMuhlbaueris doing very well today.  Her labs are stable.  I reviewed them with her in detail.  She tolerated chemotherapy moderately well.  She will proceed with Herceptin today.    I recommended she take Imodium for her diarrhea, should it persist we may need to prescribe Questran BID.  Her nausea is controlled with Zofran and she will continue this.    The patient will return in one week for labs, evaluation and cycle 1 day 15 of treatment which will be Trastuzumab only.   She knows to call [facility] the interim for any questions or concerns.  We can certainly see her sooner if needed.  I spent 25 minutes counseling the patient face to face.  The total time spent in the appointment was 30 minutes.  LMinette Headland [phone]   05/08/2014 5:31 PM

## 2014-05-05 NOTE — Patient Instructions (Signed)
[facility] Discharge Instructions for Patients Receiving Chemotherapy  Today you received the following chemotherapy agents Herceptin.  To help prevent nausea and vomiting after your treatment, we encourage you to take your nausea medication as prescribed.   If you develop nausea and vomiting that is not controlled by your nausea medication, call the clinic.   BELOW ARE SYMPTOMS THAT SHOULD BE REPORTED IMMEDIATELY:  *FEVER GREATER THAN 100.5 F  *CHILLS WITH OR WITHOUT FEVER  NAUSEA AND VOMITING THAT IS NOT CONTROLLED WITH YOUR NAUSEA MEDICATION  *UNUSUAL SHORTNESS OF BREATH  *UNUSUAL BRUISING OR BLEEDING  TENDERNESS IN MOUTH AND THROAT WITH OR WITHOUT PRESENCE OF ULCERS  *URINARY PROBLEMS  *BOWEL PROBLEMS  UNUSUAL RASH Items with * indicate a potential emergency and should be followed up as soon as possible.  Feel free to call the clinic you have any questions or concerns. The clinic phone number is [phone].    

## 2014-05-05 NOTE — Telephone Encounter (Signed)
LMOM> cancelled 4 wk f/u appt since pt scheduled for sx 2 days pior.

## 2014-05-09 ENCOUNTER — Other Ambulatory Visit: Payer: Self-pay | Admitting: Nurse Practitioner

## 2014-05-09 ENCOUNTER — Telehealth: Payer: Self-pay | Admitting: *Deleted

## 2014-05-09 DIAGNOSIS — B9689 Other specified bacterial agents as the cause of diseases classified elsewhere: Secondary | ICD-10-CM

## 2014-05-09 DIAGNOSIS — J019 Acute sinusitis, unspecified: Principal | ICD-10-CM

## 2014-05-09 MED ORDER — AZITHROMYCIN 250 MG PO TABS: ORAL_TABLET | ORAL | Status: DC

## 2014-05-09 NOTE — Telephone Encounter (Signed)
Provider input needed: FEVER   Reason for call: FEVER  Ears, nose, mouth, throat, and face: positive for earaches, epistaxis, nasal congestion and sore throat   ALLERGIES:  is allergic to sulfa antibiotics.  Patient last received chemotherapy/ treatment on 5/26 TAXOTERE/CARBOPLATIN 5/27 NEULASTA 5/29HERCEPTIN  Patient was last seen in the office on 5/29  Next appt is 6/5  Is patient having fevers greater than 100.5?  no   Is patient having uncontrolled pain, or new pain? yes, LOW BACK PAIN FROM NEULASTA INJECTION RELIEVED BY IBUPROFEN.   Is patient having new back pain that changes with position (worsens or eases when laying down?)  no   Is patient able to eat and drink? yes    Is patient able to pass stool without difficulty?   yes     Is patient having uncontrolled nausea?  no    patient calls 05/09/2014 with complaint of  Ears, nose, mouth, throat, and face: positive for earaches, epistaxis, nasal congestion, sore throat and RIGHT EARACHE ONLY/EPISTAXIS X1 ON 5/31 FOR 10 MINUTES/NASAL DRAINAGE IS CLEAR/SORE THROAT IS IMPROVED AFTER BRUSHING HER TEETH. 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   Summary Based on the above information advised patient to  AWAIT FOR A RETURN CALL.   Brandy Jackson  05/09/2014, 8:53 AM   Background Info  HANSINI CLODFELTER   DOB: [birth year]   MR#: [id number]   CSN#   [id number] 05/09/2014

## 2014-05-09 NOTE — Telephone Encounter (Signed)
VERIFIED PT.'S PHARMACY AS [facility] ON [address]. INSTRUCTED PT. CONCERNING SARAH JOHNSON,NP'S RECOMMENDATIONS. PT. WILL CALL IF ANY SYMPTOM CHANGES. SUGGESTED PT. DOES NOT RETURN TO WORK UNTIL AFTER SHE IS SEEN ON 05/12/2014. SHE VOICES UNDERSTANDING.

## 2014-05-09 NOTE — Addendum Note (Signed)
Addended by: Bill Salinas on: 05/09/2014 10:39 AM   Modules accepted: Orders

## 2014-05-09 NOTE — Telephone Encounter (Signed)
TTriage APC note   Reviewed Rn's notes as listed below. Pt complaining of low grade fever, ear/nose/throat pain -s/p chemo last week. 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   Assessment/Plan 1. Sinus infection - recommend Tylenol and Sudafed for pressure/ pain, Zpack for possible bacterial involvement 2. Follow up - keep appt on 05/12/2014, and call us back with any additional epistaxis, higher fevers, or new symptoms.   Burns Spain, AOCNP, NP-C

## 2014-05-12 ENCOUNTER — Ambulatory Visit: Payer: BC Managed Care – PPO | Admitting: Adult Health

## 2014-05-12 ENCOUNTER — Ambulatory Visit (HOSPITAL_BASED_OUTPATIENT_CLINIC_OR_DEPARTMENT_OTHER): Payer: BC Managed Care – PPO | Admitting: Physician Assistant

## 2014-05-12 ENCOUNTER — Encounter: Payer: Self-pay | Admitting: Physician Assistant

## 2014-05-12 ENCOUNTER — Ambulatory Visit (HOSPITAL_BASED_OUTPATIENT_CLINIC_OR_DEPARTMENT_OTHER): Payer: BC Managed Care – PPO

## 2014-05-12 ENCOUNTER — Telehealth: Payer: Self-pay | Admitting: Oncology

## 2014-05-12 ENCOUNTER — Other Ambulatory Visit (HOSPITAL_BASED_OUTPATIENT_CLINIC_OR_DEPARTMENT_OTHER): Payer: BC Managed Care – PPO

## 2014-05-12 VITALS — BP 137/69 | HR 79 | Temp 98.8°F | Resp 18 | Ht 67.0 in | Wt 207.7 lb

## 2014-05-12 DIAGNOSIS — C50419 Malignant neoplasm of upper-outer quadrant of unspecified female breast: Secondary | ICD-10-CM

## 2014-05-12 DIAGNOSIS — C50411 Malignant neoplasm of upper-outer quadrant of right female breast: Secondary | ICD-10-CM

## 2014-05-12 DIAGNOSIS — Z170 Estrogen receptor positive status [ER+]: Secondary | ICD-10-CM

## 2014-05-12 DIAGNOSIS — Z5112 Encounter for antineoplastic immunotherapy: Secondary | ICD-10-CM

## 2014-05-12 LAB — COMPREHENSIVE METABOLIC PANEL (CC13)
ALBUMIN: 3.5 g/dL (ref 3.5–5.0)
ALK PHOS: 120 U/L (ref 40–150)
ALT: 110 U/L — ABNORMAL HIGH (ref 0–55)
AST: 24 U/L (ref 5–34)
Anion Gap: 13 mEq/L — ABNORMAL HIGH (ref 3–11)
BUN: 15.3 mg/dL (ref 7.0–26.0)
CO2: 22 mEq/L (ref 22–29)
Calcium: 9.1 mg/dL (ref 8.4–10.4)
Chloride: 107 mEq/L (ref 98–109)
Creatinine: 0.7 mg/dL (ref 0.6–1.1)
Glucose: 161 mg/dl — ABNORMAL HIGH (ref 70–140)
POTASSIUM: 4 meq/L (ref 3.5–5.1)
SODIUM: 142 meq/L (ref 136–145)
TOTAL PROTEIN: 6.8 g/dL (ref 6.4–8.3)
Total Bilirubin: <0.2 mg/dL (ref 0.20–1.20)

## 2014-05-12 LAB — CBC WITH DIFFERENTIAL/PLATELET
BASO%: 0.1 % (ref 0.0–2.0)
Basophils Absolute: 0 10*3/uL (ref 0.0–0.1)
EOS%: 0.1 % (ref 0.0–7.0)
Eosinophils Absolute: 0 10*3/uL (ref 0.0–0.5)
HCT: 36.5 % (ref 34.8–46.6)
HGB: 11.7 g/dL (ref 11.6–15.9)
LYMPH%: 15.8 % (ref 14.0–49.7)
MCH: 26.1 pg (ref 25.1–34.0)
MCHC: 32.1 g/dL (ref 31.5–36.0)
MCV: 81.5 fL (ref 79.5–101.0)
MONO#: 0.6 10*3/uL (ref 0.1–0.9)
MONO%: 4.4 % (ref 0.0–14.0)
NEUT%: 79.6 % — ABNORMAL HIGH (ref 38.4–76.8)
NEUTROS ABS: 11.3 10*3/uL — AB (ref 1.5–6.5)
PLATELETS: 159 10*3/uL (ref 145–400)
RBC: 4.48 10*6/uL (ref 3.70–5.45)
RDW: 14.2 % (ref 11.2–14.5)
WBC: 14.2 10*3/uL — ABNORMAL HIGH (ref 3.9–10.3)
lymph#: 2.2 10*3/uL (ref 0.9–3.3)

## 2014-05-12 MED ORDER — DIPHENHYDRAMINE HCL 25 MG PO CAPS
ORAL_CAPSULE | ORAL | Status: AC
  Filled 2014-05-12: qty 2

## 2014-05-12 MED ORDER — SODIUM CHLORIDE 0.9 % IV SOLN
Freq: Once | INTRAVENOUS | Status: AC
  Administered 2014-05-12: 16:00:00 via INTRAVENOUS

## 2014-05-12 MED ORDER — HEPARIN SOD (PORK) LOCK FLUSH 100 UNIT/ML IV SOLN
500.0000 [IU] | Freq: Once | INTRAVENOUS | Status: AC | PRN
  Administered 2014-05-12: 500 [IU]
  Filled 2014-05-12: qty 5

## 2014-05-12 MED ORDER — DIPHENHYDRAMINE HCL 25 MG PO CAPS
50.0000 mg | ORAL_CAPSULE | Freq: Once | ORAL | Status: AC
  Administered 2014-05-12: 50 mg via ORAL

## 2014-05-12 MED ORDER — ACETAMINOPHEN 325 MG PO TABS
ORAL_TABLET | ORAL | Status: AC
  Filled 2014-05-12: qty 2

## 2014-05-12 MED ORDER — ACETAMINOPHEN 325 MG PO TABS
650.0000 mg | ORAL_TABLET | Freq: Once | ORAL | Status: AC
Start: 2014-05-12 — End: 2014-05-12
  Administered 2014-05-12: 650 mg via ORAL

## 2014-05-12 MED ORDER — SODIUM CHLORIDE 0.9 % IJ SOLN
10.0000 mL | INTRAMUSCULAR | Status: DC | PRN
  Administered 2014-05-12: 10 mL
  Filled 2014-05-12: qty 10

## 2014-05-12 MED ORDER — TRASTUZUMAB CHEMO INJECTION 440 MG
2.0000 mg/kg | Freq: Once | INTRAVENOUS | Status: AC
  Administered 2014-05-12: 189 mg via INTRAVENOUS
  Filled 2014-05-12: qty 9

## 2014-05-12 NOTE — Progress Notes (Signed)
ID: [name] OB: 02/18/1951  MR#: [id number]  SEG#:[id number]  PCP: Ricke Hey, MD GYN:   SU: Dr. Brantley Stage OTHER MD:  CHIEF COMPLAINT: 63 y/o woman with right breast cancer receiving adjuvant chemotherapy  BREAST CANCER HISTORY:  Brandy Jackson is a 63 y.o. female. Patient palpated a right breast mass. She had a mammogram performed and she was noted to have a mass in the upper outer quadrant measuring 2.1 x 2.0 x 1.7 cm. 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 Tumor was ER positive PR positive HER-2/neu positive (triple positive) with a proliferation marker Ki-67 at 90%. She had MRI of the breasts performed which showed a solitary mass in the upper outer quadrant measuring 2.5 cm. Her case was discussed at the multidisciplinary breast conference. Radiology and pathology were reviewed. She herself is without any complaints. She does have a little bit of tenderness at the biopsy site. She is accompanied by her husband today.   CURRENT THERAPY: Docetaxel, Carboplatin, given on day 1 of a 21 day cycle Traztuzumab given day 1, day 8 and day 15 of a 21 day cycle.  Cycle 1 day 15.    INTERVAL HISTORY:   Brandy Jackson presents accompanied her husband for a followup visit. She is status post one cycle of [facility] and is here for day 15 of the first cycle to receive Herceptin only. She had called the on-call number with symptoms of a sore throat and low-grade fevers. She was called in a Z-Pak but states she got it filled but did not take it because her throat felt better after gargling with warm salt water. She states that she had a MAXIMUM TEMPERATURE of 99.5 but this also has resolved. To update her records she tells me today that she receives the shingles vaccine in August of 2014 at the [facility]. There is some confusion as she states that originally she was under the impression that she was not to have any further surgery. She states  recently of a call from Dr. Brantley Stage office stating that she is scheduled for surgery on 06/06/2014 with a followup appointment with Dr. Brantley Stage on 06/12/2014. This is to be a reexcision possibly with further lymph node dissection.  She reports mild nausea that was relieved with Ondansetron.  Her diarrhea has resolved.   She denies dizziness, any further fevers, chills, vomiting, constipation, numbness/tingling, fingernail changes, skin changes, mouth pain or ulcers, or any further concerns.    REVIEW OF SYSTEMS: A 10 point review of systems was conducted and is otherwise negative except for what is noted above.     PAST MEDICAL HISTORY: Past Medical History  Diagnosis Date  . Diabetes mellitus without complication   . Arthritis   . Hot flashes   . Cancer   . Breast cancer   . Wears glasses   . Wears partial dentures     bottom    PAST SURGICAL HISTORY: Past Surgical History  Procedure Laterality Date  . Bladder repair w/ cesarean section    . Colonoscopy    . Portacath placement N/A 04/11/2014    Procedure: INSERTION PORT-A-CATH;  Surgeon: Joyice Faster. Cornett, MD;  Location: [facility];  Service: General;  Laterality: N/A;    FAMILY HISTORY History reviewed. No pertinent family history.  GYNECOLOGIC HISTORY: menarche at age 7, G56 P3, went through menopause at age 66.  No h/o of HRT, no h/o abnormal pap smears, or sexually transmitted infections.  Does have frequent vaginal candida infections due to diabetes that she follows with her gynecologist for.    SOCIAL HISTORY: Married to her husband michael of 36 years.  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  20 pack year tobacco history, quit 1985.  No ETOH, or elicit drug use.      ADVANCED DIRECTIVES: not in place.    HEALTH MAINTENANCE: History  Substance Use Topics  . Smoking status: Former Smoker -- 1.00 packs/day    Quit date: 03/08/1986  . Smokeless tobacco: Never  Used  . Alcohol Use: No      Mammogram: Colonoscopy: Bone Density Scan:  Pap Smear:  Eye Exam:  Vitamin D Level:   Lipid Panel:    Allergies  Allergen Reactions  . Sulfa Antibiotics Rash    Current Outpatient Prescriptions  Medication Sig Dispense Refill  . dexamethasone (DECADRON) 4 MG tablet Take 2 tablets (8 mg total) by mouth 2 (two) times daily. Take the day before chemo, then the day after chemo x 3 days.  30 tablet  4  . lidocaine-prilocaine (EMLA) cream Apply topically as needed.  30 g  6  . metFORMIN (GLUCOPHAGE) 500 MG tablet Take 500 mg by mouth 2 (two) times daily with a meal.      . LORazepam (ATIVAN) 0.5 MG tablet Take 1 tablet (0.5 mg total) by mouth every 8 (eight) hours.  30 tablet  0  . ondansetron (ZOFRAN) 8 MG tablet Take 1 tablet (8 mg total) by mouth 2 (two) times daily. Take starting the day after chemo for three days, then take twice a day PRN.  20 tablet  0  . prochlorperazine (COMPAZINE) 10 MG tablet Take 1 tablet (10 mg total) by mouth every 6 (six) hours as needed for nausea or vomiting.  30 tablet  0   No current facility-administered medications for this visit.   Facility-Administered Medications Ordered in Other Visits  Medication Dose Route Frequency Provider Last Rate Last Dose  . heparin lock flush 100 unit/mL  500 Units Intracatheter Once PRN Chauncey Cruel, MD      . sodium chloride 0.9 % injection 10 mL  10 mL Intracatheter PRN Chauncey Cruel, MD      . trastuzumab (HERCEPTIN) 189 mg in sodium chloride 0.9 % 250 mL chemo infusion  2 mg/kg (Treatment Plan Actual) Intravenous Once Chauncey Cruel, MD 518 mL/hr at 05/12/14 1615 189 mg at 05/12/14 1615    OBJECTIVE: Filed Vitals:   05/12/14 1436  BP: 137/69  Pulse: 79  Temp: 98.8 F (37.1 C)  Resp: 18     Body mass index is 32.52 kg/(m^2).     GENERAL: Patient is a well appearing female in no acute distress HEENT:  Sclerae anicteric.  Oropharynx clear and moist. No ulcerations  or evidence of oropharyngeal candidiasis. Neck is supple.  NODES:  No cervical, supraclavicular, or axillary lymphadenopathy palpated.  BREAST EXAM:  Deferred. LUNGS:  Clear to auscultation bilaterally.  No wheezes or rhonchi. HEART:  Regular rate and rhythm. No murmur appreciated. ABDOMEN:  Soft, nontender.  Positive, normoactive bowel sounds. No organomegaly palpated. MSK:  No focal spinal tenderness to palpation. Full range of motion bilaterally in the upper extremities. EXTREMITIES:  No peripheral edema.   SKIN:  Clear with no obvious rashes or skin changes. No nail dyscrasia. NEURO:  Nonfocal. Well oriented.  Appropriate affect. ECOG FS:1 - Symptomatic but completely ambulatory  LAB RESULTS:  CMP     Component Value Date/Time   NA 142 05/12/2014 1424   NA 142 04/07/2014 1320   K 4.0 05/12/2014 1424   K 4.2 04/07/2014 1320   CL 103 04/07/2014 1320   CO2 22 05/12/2014 1424   CO2 25 04/07/2014 1320   GLUCOSE 161* 05/12/2014 1424   GLUCOSE 126* 04/07/2014 1320   BUN 15.3 05/12/2014 1424   BUN 16 04/07/2014 1320   CREATININE 0.7 05/12/2014 1424   CREATININE 0.56 04/07/2014 1320   CALCIUM 9.1 05/12/2014 1424   CALCIUM 9.4 04/07/2014 1320   PROT 6.8 05/12/2014 1424   PROT 7.8 04/07/2014 1320   ALBUMIN 3.5 05/12/2014 1424   ALBUMIN 3.9 04/07/2014 1320   AST 24 05/12/2014 1424   AST 20 04/07/2014 1320   ALT 110* 05/12/2014 1424   ALT 19 04/07/2014 1320   ALKPHOS 120 05/12/2014 1424   ALKPHOS 68 04/07/2014 1320   BILITOT <0.20 05/12/2014 1424   BILITOT 0.2* 04/07/2014 1320   GFRNONAA >90 04/07/2014 1320   GFRAA >90 04/07/2014 1320    I No results found for this basename: SPEP,  UPEP,   kappa and lambda light chains    Lab Results  Component Value Date   WBC 14.2* 05/12/2014   NEUTROABS 11.3* 05/12/2014   HGB 11.7 05/12/2014   HCT 36.5 05/12/2014   MCV 81.5 05/12/2014   PLT 159 05/12/2014      Chemistry      Component Value Date/Time   NA 142 05/12/2014 1424   NA 142 04/07/2014 1320   K 4.0 05/12/2014 1424   K 4.2 04/07/2014 1320    CL 103 04/07/2014 1320   CO2 22 05/12/2014 1424   CO2 25 04/07/2014 1320   BUN 15.3 05/12/2014 1424   BUN 16 04/07/2014 1320   CREATININE 0.7 05/12/2014 1424   CREATININE 0.56 04/07/2014 1320      Component Value Date/Time   CALCIUM 9.1 05/12/2014 1424   CALCIUM 9.4 04/07/2014 1320   ALKPHOS 120 05/12/2014 1424   ALKPHOS 68 04/07/2014 1320   AST 24 05/12/2014 1424   AST 20 04/07/2014 1320   ALT 110* 05/12/2014 1424   ALT 19 04/07/2014 1320   BILITOT <0.20 05/12/2014 1424   BILITOT 0.2* 04/07/2014 1320       No results found for this basename: LABCA2    No components found with this basename: LABCA125    No results found for this basename: INR,  in the last 168 hours  Urinalysis No results found for this basename: colorurine,  appearanceur,  labspec,  phurine,  glucoseu,  hgbur,  bilirubinur,  ketonesur,  proteinur,  urobilinogen,  nitrite,  leukocytesur    STUDIES: Nm Sentinel Node Inj-no Rpt (breast)  04/11/2014   CLINICAL DATA: RIGHT BREAST CANCER   Sulfur colloid was injected intradermally by the nuclear medicine  technologist for breast cancer sentinel node localization.    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 Radioactive seed localization performed on 04/07/2014. Lumpectomy today 04/11/2014.  EXAM: SPECIMEN RADIOGRAPH OF THE RIGHT BREAST  COMPARISON:  Previous exam(s).  FINDINGS: Status post excision of the right breast. The intact radioactive seed and biopsy marker clip are present and are marked for pathology. This was discussed by telephone with the operating room nurse at the time of interpretation on 04/11/2014 at approximately 10:15 a.m.  IMPRESSION: Specimen radiograph of  the right breast demonstrating an intact radioactive seed marker, the ribbon shaped tissue marker clip and the entirety of the mass.   Electronically Signed   By: Evangeline Dakin M.D.   On:  04/11/2014 10:32   Dg Chest Port 1 View  04/11/2014   CLINICAL DATA:  Status post line placement  EXAM: PORTABLE CHEST - 1 VIEW  COMPARISON:  07/06/2007  FINDINGS: A left-sided chest wall port is noted. The catheter tip is noted at the cavoatrial junction. The overall inspiratory effort is poor with bibasilar infiltrative changes. Clinical correlation is recommended. No pneumothorax is noted.  IMPRESSION: No pneumothorax following port placement.  Bibasilar infiltrates which may be in part due to the poor inspiratory effort.  These results were called by telephone at the time of interpretation on 04/11/2014 at 12:20 PM to [place], the patients nurse, who verbally acknowledged these results.   Electronically Signed   By: Inez Catalina M.D.   On: 04/11/2014 12:19   Dg Fluoro Guide Cv Line-no Report  04/11/2014   CLINICAL DATA: portacath insertion   FLOURO GUIDE CV LINE  Fluoroscopy was utilized by the requesting physician.  No radiographic  interpretation.    [place] Rt Plc Breast Loc Dev   1st Lesion  Inc US Guide  04/07/2014   CLINICAL DATA:  Patient presents for ultrasound-guided radioactive seed was patient of a right breast carcinoma, previously biopsied.  EXAM: ULTRASOUND GUIDED RADIOACTIVE SEED LOCALIZATION OF THE RIGHT BREAST  COMPARISON:  Previous exam(s)  FINDINGS: Patient presents for radioactive seed localization prior to surgical excision. I met with the patient and we discussed the procedure of seed localization including benefits and alternatives. We discussed the high likelihood of a successful procedure. We discussed the risks of the procedure including infection, bleeding, tissue injury and further surgery. We discussed the low dose of radioactivity involved in the procedure. Informed, written consent was given.  The usual time-out protocol was performed immediately prior to the procedure.  Using ultrasound guidance, sterile technique, 2% lidocaine and an I-125 radioactive seed, the lateral right breast  mass was localized using a inferolateral approach. The follow-up mammogram images confirm the seed in the expected location and are marked for Dr. Brantley Stage. Follow-up survey of the patient confirms presence of radioactive seed.  Order number of I-125 seed:  [id number].  Dose of I-125 seed:  0.250 mCi.  The patient tolerated the procedure well and was [facility] from the [facility]. She was given instructions regarding seed removal.  IMPRESSION: Radioactive seed localization right breast. No apparent complications.   Electronically Signed   By: Lajean Manes M.D.   On: 04/07/2014 13:11    ASSESSMENT: 63 y.o. [employer], [place] woman with right breast T2 N1, invasive ductal carcinoma, grade III, ER 100%, PR negative, Ki-67 94%, HER-2/neu positive.    1. Patient underwent a right breast lumpectomy that revealed a grade III, 2.2 cm invasive ductal carcinoma.  Her invasive tumor is 0.5cm to the superior margin.  The tumor was positive for lymphovascular invasion.  DCIS was present.  The in situ carcinoma was 18m from the lateral and superior margins.  One lymph node was positive for metastatic mammary carcinoma.    PLAN:   Ms. PHerardis doing very well today.  Her labs are stable.   She tolerated chemotherapy moderately well.  She will proceed with Herceptin today.    She is to take the Imodium and Questran for her diarrhea as prescribed. She will continue Zofran as needed  for nausea. Patient was reviewed with Dr. Lewayne Bunting. We will hold her next scheduled cycle of Taxol and carboplatin and resume the next cycle of [place] approximately 2 weeks after her surgery. She'll followup with Dr. Jana Hakim at that time on 06/16/2014 at 8 AM with a lab appointment as well as chemotherapy. We will continue the weekly Herceptin in the interim.  We did call Dr. Josetta Huddle office and confirm that Brandy Jackson is scheduled for surgery on 06/06/2014. The patient and her husband both voiced understanding       She knows to call us  in the interim for any questions or concerns.  We can certainly see her sooner if needed.  I spent 25 minutes counseling the patient face to face.  The total time spent in the appointment was 30 minutes.  Carlton Adam, [place] [phone]    05/12/2014 4:43 PM

## 2014-05-12 NOTE — Telephone Encounter (Signed)
gv adn printed aptp scehd and avs for pt for June adn July....sed aded tx.

## 2014-05-12 NOTE — Patient Instructions (Signed)

## 2014-05-15 NOTE — Patient Instructions (Signed)
You will continue with weekly Herceptin and proceed with surgery as scheduled by Dr. Brantley Stage on 06/06/2014. Followup with Dr. Jana Hakim on 06/16/2014 at 8 AM when you will be would resume your chemotherapy.

## 2014-05-18 ENCOUNTER — Other Ambulatory Visit: Payer: Self-pay | Admitting: *Deleted

## 2014-05-19 ENCOUNTER — Other Ambulatory Visit (HOSPITAL_BASED_OUTPATIENT_CLINIC_OR_DEPARTMENT_OTHER): Payer: BC Managed Care – PPO

## 2014-05-19 ENCOUNTER — Ambulatory Visit (HOSPITAL_BASED_OUTPATIENT_CLINIC_OR_DEPARTMENT_OTHER): Payer: BC Managed Care – PPO

## 2014-05-19 ENCOUNTER — Other Ambulatory Visit: Payer: BC Managed Care – PPO

## 2014-05-19 ENCOUNTER — Ambulatory Visit: Payer: BC Managed Care – PPO

## 2014-05-19 VITALS — BP 138/82 | HR 86 | Temp 97.3°F | Resp 16

## 2014-05-19 DIAGNOSIS — C773 Secondary and unspecified malignant neoplasm of axilla and upper limb lymph nodes: Secondary | ICD-10-CM

## 2014-05-19 DIAGNOSIS — C50411 Malignant neoplasm of upper-outer quadrant of right female breast: Secondary | ICD-10-CM

## 2014-05-19 DIAGNOSIS — Z5112 Encounter for antineoplastic immunotherapy: Secondary | ICD-10-CM

## 2014-05-19 DIAGNOSIS — C50419 Malignant neoplasm of upper-outer quadrant of unspecified female breast: Secondary | ICD-10-CM

## 2014-05-19 LAB — COMPREHENSIVE METABOLIC PANEL (CC13)
ALBUMIN: 3.8 g/dL (ref 3.5–5.0)
ALT: 39 U/L (ref 0–55)
AST: 16 U/L (ref 5–34)
Alkaline Phosphatase: 100 U/L (ref 40–150)
Anion Gap: 11 mEq/L (ref 3–11)
BUN: 16.5 mg/dL (ref 7.0–26.0)
CO2: 20 meq/L — AB (ref 22–29)
Calcium: 9.5 mg/dL (ref 8.4–10.4)
Chloride: 109 mEq/L (ref 98–109)
Creatinine: 0.9 mg/dL (ref 0.6–1.1)
GLUCOSE: 398 mg/dL — AB (ref 70–140)
Potassium: 3.8 mEq/L (ref 3.5–5.1)
SODIUM: 139 meq/L (ref 136–145)
Total Bilirubin: 0.22 mg/dL (ref 0.20–1.20)
Total Protein: 7.3 g/dL (ref 6.4–8.3)

## 2014-05-19 LAB — CBC WITH DIFFERENTIAL/PLATELET
BASO%: 0.2 % (ref 0.0–2.0)
Basophils Absolute: 0 10*3/uL (ref 0.0–0.1)
EOS ABS: 0 10*3/uL (ref 0.0–0.5)
EOS%: 0 % (ref 0.0–7.0)
HCT: 36.5 % (ref 34.8–46.6)
HGB: 11.7 g/dL (ref 11.6–15.9)
LYMPH%: 6.4 % — ABNORMAL LOW (ref 14.0–49.7)
MCH: 25.8 pg (ref 25.1–34.0)
MCHC: 32.1 g/dL (ref 31.5–36.0)
MCV: 80.6 fL (ref 79.5–101.0)
MONO#: 0.1 10*3/uL (ref 0.1–0.9)
MONO%: 0.5 % (ref 0.0–14.0)
NEUT%: 92.9 % — ABNORMAL HIGH (ref 38.4–76.8)
NEUTROS ABS: 10.2 10*3/uL — AB (ref 1.5–6.5)
Platelets: 206 10*3/uL (ref 145–400)
RBC: 4.53 10*6/uL (ref 3.70–5.45)
RDW: 14.6 % — ABNORMAL HIGH (ref 11.2–14.5)
WBC: 11 10*3/uL — AB (ref 3.9–10.3)
lymph#: 0.7 10*3/uL — ABNORMAL LOW (ref 0.9–3.3)

## 2014-05-19 MED ORDER — SODIUM CHLORIDE 0.9 % IV SOLN
Freq: Once | INTRAVENOUS | Status: AC
  Administered 2014-05-19: 10:00:00 via INTRAVENOUS

## 2014-05-19 MED ORDER — DIPHENHYDRAMINE HCL 25 MG PO CAPS
ORAL_CAPSULE | ORAL | Status: AC
  Filled 2014-05-19: qty 2

## 2014-05-19 MED ORDER — SODIUM CHLORIDE 0.9 % IJ SOLN
10.0000 mL | INTRAMUSCULAR | Status: DC | PRN
  Administered 2014-05-19: 10 mL
  Filled 2014-05-19: qty 10

## 2014-05-19 MED ORDER — ACETAMINOPHEN 325 MG PO TABS
650.0000 mg | ORAL_TABLET | Freq: Once | ORAL | Status: AC
  Administered 2014-05-19: 650 mg via ORAL

## 2014-05-19 MED ORDER — DIPHENHYDRAMINE HCL 25 MG PO CAPS
50.0000 mg | ORAL_CAPSULE | Freq: Once | ORAL | Status: AC
Start: 2014-05-19 — End: 2014-05-19
  Administered 2014-05-19: 50 mg via ORAL

## 2014-05-19 MED ORDER — HEPARIN SOD (PORK) LOCK FLUSH 100 UNIT/ML IV SOLN
500.0000 [IU] | Freq: Once | INTRAVENOUS | Status: AC | PRN
  Administered 2014-05-19: 500 [IU]
  Filled 2014-05-19: qty 5

## 2014-05-19 MED ORDER — ACETAMINOPHEN 325 MG PO TABS
ORAL_TABLET | ORAL | Status: AC
  Filled 2014-05-19: qty 2

## 2014-05-19 MED ORDER — SODIUM CHLORIDE 0.9 % IV SOLN
2.0000 mg/kg | Freq: Once | INTRAVENOUS | Status: AC
  Administered 2014-05-19: 189 mg via INTRAVENOUS
  Filled 2014-05-19: qty 9

## 2014-05-19 NOTE — Patient Instructions (Signed)
[place] Discharge Instructions for Patients Receiving Chemotherapy  Today you received the following chemotherapy agents herceptin. You will not need to take your decadron as you did not receive taxol or carboplatin today - only herceptin. To help prevent nausea and vomiting after your treatment, we encourage you to take your nausea medication zofran.   If you develop nausea and vomiting that is not controlled by your nausea medication, call the clinic.   BELOW ARE SYMPTOMS THAT SHOULD BE REPORTED IMMEDIATELY:  *FEVER GREATER THAN 100.5 F  *CHILLS WITH OR WITHOUT FEVER  NAUSEA AND VOMITING THAT IS NOT CONTROLLED WITH YOUR NAUSEA MEDICATION  *UNUSUAL SHORTNESS OF BREATH  *UNUSUAL BRUISING OR BLEEDING  TENDERNESS IN MOUTH AND THROAT WITH OR WITHOUT PRESENCE OF ULCERS  *URINARY PROBLEMS  *BOWEL PROBLEMS  UNUSUAL RASH Items with * indicate a potential emergency and should be followed up as soon as possible.  Feel free to call the clinic you have any questions or concerns. The clinic phone number is [phone].

## 2014-05-24 ENCOUNTER — Other Ambulatory Visit: Payer: Self-pay | Admitting: Oncology

## 2014-05-25 ENCOUNTER — Encounter (INDEPENDENT_AMBULATORY_CARE_PROVIDER_SITE_OTHER): Payer: BC Managed Care – PPO | Admitting: Surgery

## 2014-05-26 ENCOUNTER — Other Ambulatory Visit (HOSPITAL_BASED_OUTPATIENT_CLINIC_OR_DEPARTMENT_OTHER): Payer: BC Managed Care – PPO

## 2014-05-26 ENCOUNTER — Ambulatory Visit (HOSPITAL_BASED_OUTPATIENT_CLINIC_OR_DEPARTMENT_OTHER): Payer: BC Managed Care – PPO

## 2014-05-26 VITALS — BP 156/69 | HR 74 | Temp 97.9°F | Resp 17

## 2014-05-26 DIAGNOSIS — C50419 Malignant neoplasm of upper-outer quadrant of unspecified female breast: Secondary | ICD-10-CM

## 2014-05-26 DIAGNOSIS — C50411 Malignant neoplasm of upper-outer quadrant of right female breast: Secondary | ICD-10-CM

## 2014-05-26 DIAGNOSIS — Z5112 Encounter for antineoplastic immunotherapy: Secondary | ICD-10-CM

## 2014-05-26 LAB — CBC WITH DIFFERENTIAL/PLATELET
BASO%: 0.5 % (ref 0.0–2.0)
Basophils Absolute: 0 10*3/uL (ref 0.0–0.1)
EOS ABS: 0 10*3/uL (ref 0.0–0.5)
EOS%: 0.2 % (ref 0.0–7.0)
HCT: 35.3 % (ref 34.8–46.6)
HGB: 11.4 g/dL — ABNORMAL LOW (ref 11.6–15.9)
LYMPH%: 22.2 % (ref 14.0–49.7)
MCH: 26.5 pg (ref 25.1–34.0)
MCHC: 32.3 g/dL (ref 31.5–36.0)
MCV: 81.9 fL (ref 79.5–101.0)
MONO#: 0.3 10*3/uL (ref 0.1–0.9)
MONO%: 6 % (ref 0.0–14.0)
NEUT%: 71.1 % (ref 38.4–76.8)
NEUTROS ABS: 4 10*3/uL (ref 1.5–6.5)
Platelets: 267 10*3/uL (ref 145–400)
RBC: 4.31 10*6/uL (ref 3.70–5.45)
RDW: 15 % — AB (ref 11.2–14.5)
WBC: 5.6 10*3/uL (ref 3.9–10.3)
lymph#: 1.3 10*3/uL (ref 0.9–3.3)

## 2014-05-26 LAB — COMPREHENSIVE METABOLIC PANEL (CC13)
ALK PHOS: 73 U/L (ref 40–150)
ALT: 21 U/L (ref 0–55)
ANION GAP: 8 meq/L (ref 3–11)
AST: 17 U/L (ref 5–34)
Albumin: 3.5 g/dL (ref 3.5–5.0)
BUN: 16.7 mg/dL (ref 7.0–26.0)
CO2: 26 meq/L (ref 22–29)
Calcium: 9.5 mg/dL (ref 8.4–10.4)
Chloride: 109 mEq/L (ref 98–109)
Creatinine: 0.7 mg/dL (ref 0.6–1.1)
GLUCOSE: 225 mg/dL — AB (ref 70–140)
POTASSIUM: 4 meq/L (ref 3.5–5.1)
SODIUM: 143 meq/L (ref 136–145)
TOTAL PROTEIN: 7 g/dL (ref 6.4–8.3)
Total Bilirubin: 0.27 mg/dL (ref 0.20–1.20)

## 2014-05-26 MED ORDER — DIPHENHYDRAMINE HCL 25 MG PO CAPS
ORAL_CAPSULE | ORAL | Status: AC
  Filled 2014-05-26: qty 2

## 2014-05-26 MED ORDER — ACETAMINOPHEN 325 MG PO TABS
ORAL_TABLET | ORAL | Status: AC
  Filled 2014-05-26: qty 2

## 2014-05-26 MED ORDER — DIPHENHYDRAMINE HCL 25 MG PO CAPS
50.0000 mg | ORAL_CAPSULE | Freq: Once | ORAL | Status: AC
  Administered 2014-05-26: 50 mg via ORAL

## 2014-05-26 MED ORDER — TRASTUZUMAB CHEMO INJECTION 440 MG
2.0000 mg/kg | Freq: Once | INTRAVENOUS | Status: AC
  Administered 2014-05-26: 189 mg via INTRAVENOUS
  Filled 2014-05-26: qty 9

## 2014-05-26 MED ORDER — SODIUM CHLORIDE 0.9 % IJ SOLN
10.0000 mL | INTRAMUSCULAR | Status: DC | PRN
  Administered 2014-05-26: 10 mL
  Filled 2014-05-26: qty 10

## 2014-05-26 MED ORDER — ACETAMINOPHEN 325 MG PO TABS
650.0000 mg | ORAL_TABLET | Freq: Once | ORAL | Status: AC
  Administered 2014-05-26: 650 mg via ORAL

## 2014-05-26 MED ORDER — SODIUM CHLORIDE 0.9 % IV SOLN
Freq: Once | INTRAVENOUS | Status: AC
  Administered 2014-05-26: 11:00:00 via INTRAVENOUS

## 2014-05-26 MED ORDER — HEPARIN SOD (PORK) LOCK FLUSH 100 UNIT/ML IV SOLN
500.0000 [IU] | Freq: Once | INTRAVENOUS | Status: AC | PRN
  Administered 2014-05-26: 500 [IU]
  Filled 2014-05-26: qty 5

## 2014-05-26 NOTE — Patient Instructions (Signed)
[place] Discharge Instructions for Patients Receiving Chemotherapy  Today you received the following chemotherapy agents: Herceptin  To help prevent nausea and vomiting after your treatment, we encourage you to take your nausea medication: Compazine 10 mg evert 6 hrs as needed.    If you develop nausea and vomiting that is not controlled by your nausea medication, call the clinic.   BELOW ARE SYMPTOMS THAT SHOULD BE REPORTED IMMEDIATELY:  *FEVER GREATER THAN 100.5 F  *CHILLS WITH OR WITHOUT FEVER  NAUSEA AND VOMITING THAT IS NOT CONTROLLED WITH YOUR NAUSEA MEDICATION  *UNUSUAL SHORTNESS OF BREATH  *UNUSUAL BRUISING OR BLEEDING  TENDERNESS IN MOUTH AND THROAT WITH OR WITHOUT PRESENCE OF ULCERS  *URINARY PROBLEMS  *BOWEL PROBLEMS  UNUSUAL RASH Items with * indicate a potential emergency and should be followed up as soon as possible.  Feel free to call the clinic you have any questions or concerns. The clinic phone number is [phone].

## 2014-05-31 ENCOUNTER — Encounter (HOSPITAL_BASED_OUTPATIENT_CLINIC_OR_DEPARTMENT_OTHER): Payer: Self-pay | Admitting: *Deleted

## 2014-05-31 NOTE — Progress Notes (Signed)
Pt here 5/84for surgery-did well-needs re-exc-no new labs needed

## 2014-06-02 ENCOUNTER — Other Ambulatory Visit (HOSPITAL_BASED_OUTPATIENT_CLINIC_OR_DEPARTMENT_OTHER): Payer: BC Managed Care – PPO

## 2014-06-02 ENCOUNTER — Ambulatory Visit (HOSPITAL_BASED_OUTPATIENT_CLINIC_OR_DEPARTMENT_OTHER): Payer: BC Managed Care – PPO

## 2014-06-02 VITALS — BP 125/58 | HR 72 | Temp 98.1°F | Resp 19

## 2014-06-02 DIAGNOSIS — C50411 Malignant neoplasm of upper-outer quadrant of right female breast: Secondary | ICD-10-CM

## 2014-06-02 DIAGNOSIS — Z5112 Encounter for antineoplastic immunotherapy: Secondary | ICD-10-CM

## 2014-06-02 DIAGNOSIS — C50419 Malignant neoplasm of upper-outer quadrant of unspecified female breast: Secondary | ICD-10-CM

## 2014-06-02 LAB — COMPREHENSIVE METABOLIC PANEL (CC13)
ALBUMIN: 3.6 g/dL (ref 3.5–5.0)
ALT: 22 U/L (ref 0–55)
AST: 18 U/L (ref 5–34)
Alkaline Phosphatase: 78 U/L (ref 40–150)
Anion Gap: 9 mEq/L (ref 3–11)
BUN: 13.1 mg/dL (ref 7.0–26.0)
CO2: 24 mEq/L (ref 22–29)
Calcium: 9.2 mg/dL (ref 8.4–10.4)
Chloride: 109 mEq/L (ref 98–109)
Creatinine: 0.8 mg/dL (ref 0.6–1.1)
Glucose: 257 mg/dl — ABNORMAL HIGH (ref 70–140)
POTASSIUM: 3.7 meq/L (ref 3.5–5.1)
SODIUM: 142 meq/L (ref 136–145)
Total Bilirubin: 0.27 mg/dL (ref 0.20–1.20)
Total Protein: 6.9 g/dL (ref 6.4–8.3)

## 2014-06-02 LAB — CBC WITH DIFFERENTIAL/PLATELET
BASO%: 0.6 % (ref 0.0–2.0)
Basophils Absolute: 0 10*3/uL (ref 0.0–0.1)
EOS%: 1.1 % (ref 0.0–7.0)
Eosinophils Absolute: 0.1 10*3/uL (ref 0.0–0.5)
HEMATOCRIT: 34.5 % — AB (ref 34.8–46.6)
HGB: 11.1 g/dL — ABNORMAL LOW (ref 11.6–15.9)
LYMPH%: 20.7 % (ref 14.0–49.7)
MCH: 26.5 pg (ref 25.1–34.0)
MCHC: 32.2 g/dL (ref 31.5–36.0)
MCV: 82.3 fL (ref 79.5–101.0)
MONO#: 0.4 10*3/uL (ref 0.1–0.9)
MONO%: 6 % (ref 0.0–14.0)
NEUT%: 71.6 % (ref 38.4–76.8)
NEUTROS ABS: 4.5 10*3/uL (ref 1.5–6.5)
PLATELETS: 233 10*3/uL (ref 145–400)
RBC: 4.19 10*6/uL (ref 3.70–5.45)
RDW: 15.2 % — ABNORMAL HIGH (ref 11.2–14.5)
WBC: 6.3 10*3/uL (ref 3.9–10.3)
lymph#: 1.3 10*3/uL (ref 0.9–3.3)

## 2014-06-02 MED ORDER — SODIUM CHLORIDE 0.9 % IV SOLN
Freq: Once | INTRAVENOUS | Status: AC
  Administered 2014-06-02: 11:00:00 via INTRAVENOUS

## 2014-06-02 MED ORDER — ACETAMINOPHEN 325 MG PO TABS
ORAL_TABLET | ORAL | Status: AC
  Filled 2014-06-02: qty 2

## 2014-06-02 MED ORDER — TRASTUZUMAB CHEMO INJECTION 440 MG
2.0000 mg/kg | Freq: Once | INTRAVENOUS | Status: AC
  Administered 2014-06-02: 189 mg via INTRAVENOUS
  Filled 2014-06-02: qty 9

## 2014-06-02 MED ORDER — DIPHENHYDRAMINE HCL 25 MG PO CAPS
ORAL_CAPSULE | ORAL | Status: AC
  Filled 2014-06-02: qty 2

## 2014-06-02 MED ORDER — HEPARIN SOD (PORK) LOCK FLUSH 100 UNIT/ML IV SOLN
500.0000 [IU] | Freq: Once | INTRAVENOUS | Status: AC | PRN
  Administered 2014-06-02: 500 [IU]
  Filled 2014-06-02: qty 5

## 2014-06-02 MED ORDER — SODIUM CHLORIDE 0.9 % IJ SOLN
10.0000 mL | INTRAMUSCULAR | Status: DC | PRN
  Administered 2014-06-02: 10 mL
  Filled 2014-06-02: qty 10

## 2014-06-02 MED ORDER — DIPHENHYDRAMINE HCL 25 MG PO CAPS
50.0000 mg | ORAL_CAPSULE | Freq: Once | ORAL | Status: AC
  Administered 2014-06-02: 50 mg via ORAL

## 2014-06-02 MED ORDER — ACETAMINOPHEN 325 MG PO TABS
650.0000 mg | ORAL_TABLET | Freq: Once | ORAL | Status: AC
  Administered 2014-06-02: 650 mg via ORAL

## 2014-06-02 NOTE — Patient Instructions (Signed)
[facility] Discharge Instructions for Patients Receiving Chemotherapy  Today you received the following chemotherapy agents: Herceptin  To help prevent nausea and vomiting after your treatment, we encourage you to take your nausea medication as prescribed by your physician.    If you develop nausea and vomiting that is not controlled by your nausea medication, call the clinic.   BELOW ARE SYMPTOMS THAT SHOULD BE REPORTED IMMEDIATELY:  *FEVER GREATER THAN 100.5 F  *CHILLS WITH OR WITHOUT FEVER  NAUSEA AND VOMITING THAT IS NOT CONTROLLED WITH YOUR NAUSEA MEDICATION  *UNUSUAL SHORTNESS OF BREATH  *UNUSUAL BRUISING OR BLEEDING  TENDERNESS IN MOUTH AND THROAT WITH OR WITHOUT PRESENCE OF ULCERS  *URINARY PROBLEMS  *BOWEL PROBLEMS  UNUSUAL RASH Items with * indicate a potential emergency and should be followed up as soon as possible.  Feel free to call the clinic you have any questions or concerns. The clinic phone number is [phone].    

## 2014-06-06 ENCOUNTER — Encounter (HOSPITAL_BASED_OUTPATIENT_CLINIC_OR_DEPARTMENT_OTHER)
Admission: RE | Disposition: A | Discharge status: Home / Self Care | Payer: Self-pay | Source: Ambulatory Visit | Attending: Surgery

## 2014-06-06 ENCOUNTER — Encounter (HOSPITAL_BASED_OUTPATIENT_CLINIC_OR_DEPARTMENT_OTHER): Payer: BC Managed Care – PPO | Admitting: Anesthesiology

## 2014-06-06 ENCOUNTER — Ambulatory Visit (HOSPITAL_BASED_OUTPATIENT_CLINIC_OR_DEPARTMENT_OTHER): Payer: BC Managed Care – PPO | Admitting: Anesthesiology

## 2014-06-06 ENCOUNTER — Encounter (HOSPITAL_BASED_OUTPATIENT_CLINIC_OR_DEPARTMENT_OTHER): Payer: Self-pay | Admitting: *Deleted

## 2014-06-06 ENCOUNTER — Ambulatory Visit (HOSPITAL_BASED_OUTPATIENT_CLINIC_OR_DEPARTMENT_OTHER)
Admission: RE | Admit: 2014-06-06 | Discharge: 2014-06-06 | Disposition: A | Discharge status: Home / Self Care | Payer: BC Managed Care – PPO | Source: Ambulatory Visit | Attending: Surgery | Admitting: Surgery

## 2014-06-06 DIAGNOSIS — C50411 Malignant neoplasm of upper-outer quadrant of right female breast: Secondary | ICD-10-CM

## 2014-06-06 DIAGNOSIS — E119 Type 2 diabetes mellitus without complications: Secondary | ICD-10-CM | POA: Insufficient documentation

## 2014-06-06 DIAGNOSIS — Z9889 Other specified postprocedural states: Secondary | ICD-10-CM

## 2014-06-06 DIAGNOSIS — C50911 Malignant neoplasm of unspecified site of right female breast: Secondary | ICD-10-CM

## 2014-06-06 DIAGNOSIS — D0590 Unspecified type of carcinoma in situ of unspecified breast: Secondary | ICD-10-CM

## 2014-06-06 DIAGNOSIS — C773 Secondary and unspecified malignant neoplasm of axilla and upper limb lymph nodes: Secondary | ICD-10-CM | POA: Insufficient documentation

## 2014-06-06 DIAGNOSIS — Z6830 Body mass index (BMI) 30.0-30.9, adult: Secondary | ICD-10-CM | POA: Insufficient documentation

## 2014-06-06 HISTORY — PX: RE-EXCISION OF BREAST LUMPECTOMY: SHX6048

## 2014-06-06 LAB — GLUCOSE, CAPILLARY
Glucose-Capillary: 168 mg/dL — ABNORMAL HIGH (ref 70–99)
Glucose-Capillary: 144 mg/dL — ABNORMAL HIGH (ref 70–99)

## 2014-06-06 SURGERY — EXCISION, LESION, BREAST
Anesthesia: General | Site: Breast | Laterality: Right

## 2014-06-06 MED ORDER — FENTANYL CITRATE 0.05 MG/ML IJ SOLN: 50.0000 ug | INTRAMUSCULAR | Status: DC | PRN

## 2014-06-06 MED ORDER — MIDAZOLAM HCL 2 MG/2ML IJ SOLN
INTRAMUSCULAR | Status: AC
  Filled 2014-06-06: qty 2

## 2014-06-06 MED ORDER — LACTATED RINGERS IV SOLN
INTRAVENOUS | Status: DC
  Administered 2014-06-06 (×3): via INTRAVENOUS

## 2014-06-06 MED ORDER — ONDANSETRON HCL 4 MG/2ML IJ SOLN: 4.0000 mg | Freq: Once | INTRAMUSCULAR | Status: DC | PRN

## 2014-06-06 MED ORDER — PROPOFOL 10 MG/ML IV BOLUS
INTRAVENOUS | Status: DC | PRN
  Administered 2014-06-06: 200 mg via INTRAVENOUS

## 2014-06-06 MED ORDER — HYDROMORPHONE HCL PF 1 MG/ML IJ SOLN: 0.2500 mg | INTRAMUSCULAR | Status: DC | PRN

## 2014-06-06 MED ORDER — MIDAZOLAM HCL 5 MG/5ML IJ SOLN
INTRAMUSCULAR | Status: DC | PRN
  Administered 2014-06-06: 2 mg via INTRAVENOUS

## 2014-06-06 MED ORDER — BUPIVACAINE-EPINEPHRINE (PF) 0.25% -1:200000 IJ SOLN
INTRAMUSCULAR | Status: AC
  Filled 2014-06-06: qty 30

## 2014-06-06 MED ORDER — OXYCODONE-ACETAMINOPHEN 5-325 MG PO TABS: 1.0000 | ORAL_TABLET | ORAL | Status: DC | PRN

## 2014-06-06 MED ORDER — LIDOCAINE HCL (CARDIAC) 20 MG/ML IV SOLN
INTRAVENOUS | Status: DC | PRN
  Administered 2014-06-06: 60 mg via INTRAVENOUS

## 2014-06-06 MED ORDER — MIDAZOLAM HCL 2 MG/2ML IJ SOLN: 1.0000 mg | INTRAMUSCULAR | Status: DC | PRN

## 2014-06-06 MED ORDER — CEFAZOLIN SODIUM-DEXTROSE 2-3 GM-% IV SOLR: 2.0000 g | INTRAVENOUS | Status: DC

## 2014-06-06 MED ORDER — OXYCODONE HCL 5 MG PO TABS: 5.0000 mg | ORAL_TABLET | Freq: Once | ORAL | Status: DC | PRN

## 2014-06-06 MED ORDER — DEXAMETHASONE SODIUM PHOSPHATE 4 MG/ML IJ SOLN
INTRAMUSCULAR | Status: DC | PRN
  Administered 2014-06-06: 10 mg via INTRAVENOUS

## 2014-06-06 MED ORDER — OXYCODONE HCL 5 MG/5ML PO SOLN: 5.0000 mg | Freq: Once | ORAL | Status: DC | PRN

## 2014-06-06 MED ORDER — BUPIVACAINE-EPINEPHRINE 0.25% -1:200000 IJ SOLN
INTRAMUSCULAR | Status: DC | PRN
  Administered 2014-06-06: 10 mL

## 2014-06-06 MED ORDER — ONDANSETRON HCL 4 MG/2ML IJ SOLN
INTRAMUSCULAR | Status: DC | PRN
  Administered 2014-06-06: 4 mg via INTRAVENOUS

## 2014-06-06 MED ORDER — FENTANYL CITRATE 0.05 MG/ML IJ SOLN
INTRAMUSCULAR | Status: AC
  Filled 2014-06-06: qty 6

## 2014-06-06 MED ORDER — FENTANYL CITRATE 0.05 MG/ML IJ SOLN
INTRAMUSCULAR | Status: DC | PRN
  Administered 2014-06-06 (×2): 50 ug via INTRAVENOUS

## 2014-06-06 MED ORDER — CEFAZOLIN SODIUM-DEXTROSE 2-3 GM-% IV SOLR
INTRAVENOUS | Status: AC
  Filled 2014-06-06: qty 50

## 2014-06-06 SURGICAL SUPPLY — 52 items
ADH SKN CLS APL DERMABOND .7 (GAUZE/BANDAGES/DRESSINGS) ×1
APPLIER CLIP 9.375 MED OPEN (MISCELLANEOUS) ×3
APR CLP MED 9.3 20 MLT OPN (MISCELLANEOUS) ×1
BINDER BREAST LRG (GAUZE/BANDAGES/DRESSINGS) IMPLANT
BINDER BREAST MEDIUM (GAUZE/BANDAGES/DRESSINGS) IMPLANT
BINDER BREAST XLRG (GAUZE/BANDAGES/DRESSINGS) ×2 IMPLANT
BINDER BREAST XXLRG (GAUZE/BANDAGES/DRESSINGS) IMPLANT
BLADE SURG 15 STRL LF DISP TIS (BLADE) ×1 IMPLANT
BLADE SURG 15 STRL SS (BLADE) ×3
CANISTER SUCT 1200ML W/VALVE (MISCELLANEOUS) ×3 IMPLANT
CHLORAPREP W/TINT 26ML (MISCELLANEOUS) ×3 IMPLANT
CLIP APPLIE 9.375 MED OPEN (MISCELLANEOUS) ×1 IMPLANT
CLIP TI WIDE RED SMALL 6 (CLIP) IMPLANT
COVER MAYO STAND STRL (DRAPES) ×3 IMPLANT
COVER TABLE BACK 60X90 (DRAPES) ×3 IMPLANT
DECANTER SPIKE VIAL GLASS SM (MISCELLANEOUS) IMPLANT
DERMABOND ADVANCED (GAUZE/BANDAGES/DRESSINGS) ×2
DERMABOND ADVANCED .7 DNX12 (GAUZE/BANDAGES/DRESSINGS) ×1 IMPLANT
DEVICE DUBIN W/COMP PLATE 8390 (MISCELLANEOUS) IMPLANT
DRAPE LAPAROSCOPIC ABDOMINAL (DRAPES) IMPLANT
DRAPE PED LAPAROTOMY (DRAPES) ×3 IMPLANT
DRAPE UTILITY XL STRL (DRAPES) ×3 IMPLANT
ELECT COATED BLADE 2.86 ST (ELECTRODE) ×3 IMPLANT
ELECT REM PT RETURN 9FT ADLT (ELECTROSURGICAL) ×3
ELECTRODE REM PT RTRN 9FT ADLT (ELECTROSURGICAL) ×1 IMPLANT
GLOVE BIOGEL PI IND STRL 7.0 (GLOVE) IMPLANT
GLOVE BIOGEL PI IND STRL 8 (GLOVE) ×1 IMPLANT
GLOVE BIOGEL PI INDICATOR 7.0 (GLOVE) ×2
GLOVE BIOGEL PI INDICATOR 8 (GLOVE) ×2
GLOVE ECLIPSE 8.0 STRL XLNG CF (GLOVE) ×3 IMPLANT
GLOVE SURG SS PI 6.5 STRL IVOR (GLOVE) ×2 IMPLANT
GLOVE SURG SS PI 7.0 STRL IVOR (GLOVE) ×2 IMPLANT
GOWN STRL REUS W/ TWL LRG LVL3 (GOWN DISPOSABLE) ×2 IMPLANT
GOWN STRL REUS W/TWL LRG LVL3 (GOWN DISPOSABLE) ×9
KIT MARKER MARGIN INK (KITS) ×3 IMPLANT
NEEDLE HYPO 25X1 1.5 SAFETY (NEEDLE) ×3 IMPLANT
NS IRRIG 1000ML POUR BTL (IV SOLUTION) ×3 IMPLANT
PACK BASIN DAY SURGERY FS (CUSTOM PROCEDURE TRAY) ×3 IMPLANT
PENCIL BUTTON HOLSTER BLD 10FT (ELECTRODE) ×3 IMPLANT
SLEEVE SCD COMPRESS KNEE MED (MISCELLANEOUS) ×3 IMPLANT
SPONGE LAP 4X18 X RAY DECT (DISPOSABLE) ×3 IMPLANT
STAPLER VISISTAT 35W (STAPLE) IMPLANT
SUT MON AB 4-0 PC3 18 (SUTURE) ×3 IMPLANT
SUT SILK 2 0 SH (SUTURE) IMPLANT
SUT VIC AB 3-0 SH 27 (SUTURE) ×6
SUT VIC AB 3-0 SH 27X BRD (SUTURE) ×1 IMPLANT
SYR CONTROL 10ML LL (SYRINGE) ×3 IMPLANT
TOWEL OR 17X24 6PK STRL BLUE (TOWEL DISPOSABLE) ×3 IMPLANT
TOWEL OR NON WOVEN STRL DISP B (DISPOSABLE) ×1 IMPLANT
TUBE CONNECTING 20'X1/4 (TUBING) ×1
TUBE CONNECTING 20X1/4 (TUBING) ×2 IMPLANT
YANKAUER SUCT BULB TIP NO VENT (SUCTIONS) ×3 IMPLANT

## 2014-06-06 NOTE — Interval H&P Note (Signed)
History and Physical Interval Note:  06/06/2014 10:23 AM  Brandy Jackson  has presented today for surgery, with the diagnosis of DCIS  The various methods of treatment have been discussed with the patient and family. After consideration of risks, benefits and other options for treatment, the patient has consented to  Procedure(s): RE-EXCISION OF BREAST LUMPECTOMY (Right) as a surgical intervention .  The patient's history has been reviewed, patient examined, no change in status, stable for surgery.  I have reviewed the patient's chart and labs.  Questions were answered to the patient's satisfaction.     CORNETT,THOMAS A.

## 2014-06-06 NOTE — Anesthesia Procedure Notes (Signed)
Procedure Name: LMA Insertion Date/Time: 06/06/2014 10:44 AM Performed by: Toula Moos L Pre-anesthesia Checklist: Patient identified, Emergency Drugs available, Suction available, Patient being monitored and Timeout performed Patient Re-evaluated:Patient Re-evaluated prior to inductionOxygen Delivery Method: Circle System Utilized Preoxygenation: Pre-oxygenation with 100% oxygen Intubation Type: IV induction Ventilation: Mask ventilation without difficulty LMA: LMA inserted LMA Size: 4.0 Number of attempts: 1 Airway Equipment and Method: bite block Placement Confirmation: positive ETCO2 and breath sounds checked- equal and bilateral Tube secured with: Tape Dental Injury: Teeth and Oropharynx as per pre-operative assessment

## 2014-06-06 NOTE — H&P (Signed)
Brandy Jackson Description: 63 year old female  04/24/2014 9:00 AM Office Visit Jenniferann Stuckert: Joyice Faster. Cornett, MD  MRN: [id number] Department: [facility]          Guarantor Account: Beverly, Ferner ([id number])     Relation to Patient: Account Type Service Area    Self Personal/Family [place] for This Account     Coverage ID Payor Plan Insurance ID     [id number] [place] [id number]             Guarantor Account: Chika, Cichowski ([id number])     Relation to Patient: Account Type Service Area    Self Personal/Family [place]                Guarantor Account: Jeyla, Bulger ([id number])     Relation to Patient: Account Type Service Area    Self Personal/Family [facility]                Guarantor Account: Sanyia, Dini ([id number])     Relation to Patient: Account Type Service Area    Self Personal/Family [place] for This Account     Coverage ID Payor Plan Insurance ID     [id number] [place] [id number]                   Diagnoses   Reason for Visit    Post-operative state - Primary   right bresat follow-up   V45.89                    Current Vitals Most recent update: 04/24/2014 8:58 AM by [place], CMA     BP Pulse Temp(Src) Ht Wt BMI    126/80 77 98.5 F (36.9 C) $Remove'5\' 7"'WoSjKEs$  (1.702 m) 204 lb (92.534 kg) 31.94 kg/m2                  Progress Notes     Thomas A. Cornett, MD at 04/24/2014 9:20 AM     Status: Signed        Patient returns after right breast lumpectomy with sentinel lymph node mapping and Port-A-Cath placement for stage II breast cancer. She is doing well.  Exam: Right breast incision clean dry and intact without  signs of infection. Left-sided Port-A-Cath intact.  Pathology:1. Breast,  lumpectomy, Right  - INVASIVE DUCTAL CARCINOMA, SEE COMMENT.  - INVASIVE TUMOR IS 0.5 CM TO THE NEAREST MARGIN (SUPERIOR).  - POSITIVE FOR LYMPH VASCULAR INVASION.  - DUCTAL CARCINOMA IN SITU.  - IN SITU CARCINOMA IS 1 MM FROM THE NEAREST LATERAL AND SUPERIOR MARGINS.  - SEE TUMOR SYNOPTIC TEMPLATE BELOW.  2. Lymph node, sentinel, biopsy, Right axillary  - ONE LYMPH NODE, NEGATIVE FOR TUMOR, (0/1).  3. Lymph node, sentinel, biopsy, Right axillary  - ONE LYMPH NODE, POSITIVE FOR METASTATIC MAMMARY CARCINOMA (1/1).  - INTRANODAL TUMOR DEPOSIT IS 1.0 CM.  - NEGATIVE FOR EXTRACAPSULAR TUMOR EXTENSION.  4. Lymph node, sentinel, biopsy, Right axillary  - ONE LYMPH NODE, NEGATIVE FOR TUMOR, (0/1).  Microscopic Comment  1. BREAST, INVASIVE TUMOR, WITH LYMPH NODE SAMPLING  Specimen, including laterality and lymph node sampling (sentinel,  non-sentinel): Right breast with sentinel  lymph node sampling.  Procedure: Lumpectomy.  Histologic type: Ductal.  Grade: III of III  Tubule formation: 3  Nuclear pleomorphism: 3  Mitotic:3  Tumor size (gross measurement): 2.2 cm  Margins:  Invasive, distance to closest margin: 0.5  In-situ, distance to closest margin: 1 mm  If margin positive, focally or broadly: N/A  Lymphovascular invasion: Present.  Ductal carcinoma in situ: Present.  1 of 3  FINAL for TAMME, MOZINGO [phone])  Microscopic Comment(continued)  Grade: II of III  Extensive intraductal component: Absent.  Lobular neoplasia: Absent.  Tumor focality: Unifocal  Treatment effect: None.  If present, treatment effect in breast tissue, lymph nodes or both: N/A  Extent of tumor:  Skin: Microscopically negative.  Nipple: N/A  Skeletal muscle: N/A  Lymph nodes:  Examined: 3 Sentinel  0 Non-sentinel  3 Total  Lymph nodes with metastasis: 1  Isolated tumor cells (< 0.2 mm): 0  Micrometastasis: (> 0.2 mm and < 2.0 mm): 0  Macrometastasis: (> 2.0 mm): x 1  Extracapsular  extension: Absent.  Breast prognostic profile:  Estrogen receptor: Not repeated previous study demonstrated 100% positivity ([id number])  Progesterone receptor: Not repeated previous study demonstrated 0% positivity ([id number])  Her 2 neu: No repeated, previous study demonstrated amplification (4.15) ([id number])  Ki-67: Not repeated, previous study demonstrated 94% proliferation rate ([id number])  Non-neoplastic breast: N/A  TNM: pT2, pN1a, pMX  Comments: None (CRR:gt, 04/12/14)  Mali RUND DO  Pathologist, Electronic Signature  (Case signed 04/12/2014)  Impression: Stage II right breast cancer status post breast conservation 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 Tumor margins were widely negative but there are 2 margins are close with DCIS at 1 mm. The margin itself is negative. Given that she will require adjuvant therapy, I would not re\re excise this point. We'll await oncology visit in radiation oncology input. If so desired,re excision is possible but the benefit from this is minimal. Return in one month or sooner.        Thomas A. Cornett, MD at 04/26/2014 7:28 AM     Status: Signed        Pt discussed in [place]. Consensus was for re excision of close margin for DCIS component. Will set up and arrange with patient. The procedure has been discussed with the patient. Alternatives to surgery have been discussed with the patient.  Risks of surgery include bleeding,  Infection,  Seroma formation, death,  and the need for further surgery.   The patient understands and wishes to proceed.

## 2014-06-06 NOTE — Transfer of Care (Signed)
Immediate Anesthesia Transfer of Care Note  Patient: Brandy Jackson  Procedure(s) Performed: Procedure(s): RE-EXCISION OF BREAST LUMPECTOMY (Right)  Patient Location: PACU  Anesthesia Type:General  Level of Consciousness: sedated  Airway & Oxygen Therapy: Patient Spontanous Breathing and Patient connected to face mask oxygen  Post-op Assessment: Report given to PACU RN and Post -op Vital signs reviewed and stable  Post vital signs: Reviewed and stable  Complications: No apparent anesthesia complications

## 2014-06-06 NOTE — Op Note (Signed)
Right Breast Re-excison Lumpectomy Procedure Note  Indications:  This patient returns following an initial lumpectomy.  Analysis of the pathology specimen revealed microscopic involvement of the multiple margins margins.  The patient now returns for re-excision.The procedure has been discussed with the patient. Alternatives to surgery have been discussed with the patient.  Risks of surgery include bleeding,  Infection,  Seroma formation, death,  and the need for further surgery.   The patient understands and wishes to proceed.  Pre-operative Diagnosis: right breast cancer  Post-operative Diagnosis: right breast cancer  Surgeon: Erroll Luna A.   Assistants: none  Anesthesia: General LMA anesthesia and Local anesthesia 0.25.% bupivacaine, with epinephrine  ASA Class: 2  Procedure Details  The patient was seen in the Holding Room. The risks, benefits, complications, treatment options, and expected outcomes were discussed with the patient. The possibilities of reaction to medication, pulmonary aspiration, bleeding, infection, the need for additional procedures, failure to diagnose a condition, and creating a complication requiring transfusion or operation were discussed with the patient. The patient concurred with the proposed plan, giving informed consent.  The site of surgery properly noted/marked. The patient was taken to Operating Room # 8, identified as Brandy Jackson and the procedure verified as  Right Breast Re-excision Lumpectomy. A Time Out was held and the above information confirmed.  The patient was placed supine.  The breast was prepped and draped in standard fashion. Marcaine 0.25% with epinephrine was used to anesthetize the skin around the previous lumpectomy incision.  The incision was opened.  A  seroma was evacuated.  Additional local anesthesia was delivered  within the lumpectomy cavity.  A full thickness re-excision was performed. All six margins were inked and oriented.   The new margin was inked and the specimen was submitted to pathology.  Hemostasis was achieved with cautery.  Closure was performed in 2 layers with a 3-0 Vicryl and 4 0 monocryl subcuticular closure.    Dermabond was  applied.  At the end of the operation, all sponge, instrument and needle counts were correct.   Findings: grossly clear surgical margins  Estimated Blood Loss:  Minimal         Drains: none         Total IV Fluids:  500 ml         Specimens: see above                 Complications:  None; patient tolerated the procedure well.         Disposition: PACU - hemodynamically stable.         Condition: stable  Attending Attestation: I performed the procedure.

## 2014-06-06 NOTE — Anesthesia Preprocedure Evaluation (Signed)
Anesthesia Evaluation  Patient identified by MRN, date of birth, ID band Patient awake    Reviewed: Allergy & Precautions, H&P , NPO status , Patient's Chart, lab work & pertinent test results  Airway Mallampati: I TM Distance: >3 FB Neck ROM: Full    Dental  (+) Teeth Intact, Dental Advisory Given   Pulmonary former smoker,  breath sounds clear to auscultation        Cardiovascular Rhythm:Regular Rate:Normal     Neuro/Psych    GI/Hepatic   Endo/Other  diabetes, Well Controlled, Type 2, Oral Hypoglycemic AgentsMorbid obesity  Renal/GU      Musculoskeletal   Abdominal   Peds  Hematology   Anesthesia Other Findings   Reproductive/Obstetrics                           Anesthesia Physical Anesthesia Plan  ASA: III  Anesthesia Plan: General   Post-op Pain Management:    Induction: Intravenous  Airway Management Planned: LMA  Additional Equipment:   Intra-op Plan:   Post-operative Plan: Extubation in OR  Informed Consent: I have reviewed the patients History and Physical, chart, labs and discussed the procedure including the risks, benefits and alternatives for the proposed anesthesia with the patient or authorized representative who has indicated his/her understanding and acceptance.   Dental advisory given  Plan Discussed with: CRNA, Anesthesiologist and Surgeon  Anesthesia Plan Comments:         Anesthesia Quick Evaluation

## 2014-06-06 NOTE — Anesthesia Postprocedure Evaluation (Signed)
  Anesthesia Post-op Note  Patient: Brandy Jackson  Procedure(s) Performed: Procedure(s): RE-EXCISION OF BREAST LUMPECTOMY (Right)  Patient Location: PACU  Anesthesia Type:General  Level of Consciousness: awake and alert   Airway and Oxygen Therapy: Patient Spontanous Breathing  Post-op Pain: none  Post-op Assessment: Post-op Vital signs reviewed, Patient's Cardiovascular Status Stable and Respiratory Function Stable  Post-op Vital Signs: Reviewed  Filed Vitals:   06/06/14 1230  BP: 154/73  Pulse: 90  Temp:   Resp: 21    Complications: No apparent anesthesia complications

## 2014-06-06 NOTE — Discharge Instructions (Signed)
[facility] °Office Phone Number [phone] ° °BREAST BIOPSY/ PARTIAL MASTECTOMY: POST OP INSTRUCTIONS ° °Always review your discharge instruction sheet given to you by the facility where your surgery was performed. ° °IF YOU HAVE DISABILITY OR FAMILY LEAVE FORMS, YOU MUST BRING THEM TO THE OFFICE FOR PROCESSING.  DO NOT GIVE THEM TO YOUR DOCTOR. ° °1. A prescription for pain medication may be given to you upon discharge.  Take your pain medication as prescribed, if needed.  If narcotic pain medicine is not needed, then you may take acetaminophen (Tylenol) or ibuprofen (Advil) as needed. °2. Take your usually prescribed medications unless otherwise directed °3. If you need a refill on your pain medication, please contact your pharmacy.  They will contact our office to request authorization.  Prescriptions will not be filled after 5pm or on week-ends. °4. You should eat very light the first 24 hours after surgery, such as soup, crackers, pudding, etc.  Resume your normal diet the day after surgery. °5. Most patients will experience some swelling and bruising in the breast.  Ice packs and a good support bra will help.  Swelling and bruising can take several days to resolve.  °6. It is common to experience some constipation if taking pain medication after surgery.  Increasing fluid intake and taking a stool softener will usually help or prevent this problem from occurring.  A mild laxative (Milk of Magnesia or Miralax) should be taken according to package directions if there are no bowel movements after 48 hours. °7. Unless discharge instructions indicate otherwise, you may remove your bandages 24-48 hours after surgery, and you may shower at that time.  You may have steri-strips (small skin tapes) in place directly over the incision.  These strips should be left on the skin for 7-10 days.  If your surgeon used skin glue on the incision, you may shower in 24 hours.  The glue will flake off over the  next 2-3 weeks.  Any sutures or staples will be removed at the office during your follow-up visit. °8. ACTIVITIES:  You may resume regular daily activities (gradually increasing) beginning the next day.  Wearing a good support bra or sports bra minimizes pain and swelling.  You may have sexual intercourse when it is comfortable. °a. You may drive when you no longer are taking prescription pain medication, you can comfortably wear a seatbelt, and you can safely maneuver your car and apply brakes. °b. RETURN TO WORK:  ______________________________________________________________________________________ °9. You should see your doctor in the office for a follow-up appointment approximately two weeks after your surgery.  Your doctor’s nurse will typically make your follow-up appointment when she calls you with your pathology report.  Expect your pathology report 2-3 business days after your surgery.  You may call to check if you do not hear from us after three days. °10. OTHER INSTRUCTIONS: _______________________________________________________________________________________________ _____________________________________________________________________________________________________________________________________ °_____________________________________________________________________________________________________________________________________ °_____________________________________________________________________________________________________________________________________ ° °WHEN TO CALL YOUR DOCTOR: °1. Fever over 101.0 °2. Nausea and/or vomiting. °3. Extreme swelling or bruising. °4. Continued bleeding from incision. °5. Increased pain, redness, or drainage from the incision. ° °The clinic staff is available to answer your questions during regular business hours.  Please don’t hesitate to call and ask to speak to one of the nurses for clinical concerns.  If you have a medical emergency, go to the nearest  emergency room or call 911.  A surgeon from [facility] is always on call at the hospital. ° °For further questions, please visit [email or web address]  ° ° °  Post Anesthesia Home Care Instructions ° °Activity: °Get plenty of rest for the remainder of the day. A responsible adult should stay with you for 24 hours following the procedure.  °For the next 24 hours, DO NOT: °-Drive a car °-Operate machinery °-Drink alcoholic beverages °-Take any medication unless instructed by your physician °-Make any legal decisions or sign important papers. ° °Meals: °Start with liquid foods such as gelatin or soup. Progress to regular foods as tolerated. Avoid greasy, spicy, heavy foods. If nausea and/or vomiting occur, drink only clear liquids until the nausea and/or vomiting subsides. Call your physician if vomiting continues. ° °Special Instructions/Symptoms: °Your throat may feel dry or sore from the anesthesia or the breathing tube placed in your throat during surgery. If this causes discomfort, gargle with warm salt water. The discomfort should disappear within 24 hours. ° °

## 2014-06-07 ENCOUNTER — Encounter (HOSPITAL_BASED_OUTPATIENT_CLINIC_OR_DEPARTMENT_OTHER): Payer: Self-pay | Admitting: Surgery

## 2014-06-08 ENCOUNTER — Ambulatory Visit: Payer: BC Managed Care – PPO

## 2014-06-09 ENCOUNTER — Ambulatory Visit: Payer: BC Managed Care – PPO

## 2014-06-12 ENCOUNTER — Ambulatory Visit (INDEPENDENT_AMBULATORY_CARE_PROVIDER_SITE_OTHER): Payer: BC Managed Care – PPO | Admitting: Surgery

## 2014-06-12 ENCOUNTER — Encounter (INDEPENDENT_AMBULATORY_CARE_PROVIDER_SITE_OTHER): Payer: Self-pay | Admitting: Surgery

## 2014-06-12 VITALS — BP 126/78 | HR 72 | Temp 98.1°F | Ht 67.0 in | Wt 211.0 lb

## 2014-06-12 DIAGNOSIS — Z9889 Other specified postprocedural states: Secondary | ICD-10-CM

## 2014-06-12 NOTE — Patient Instructions (Signed)
Follow up 3 months.  Follow up with oncology as directed. I've sent a note to radiation oncology to contact you for follow up.

## 2014-06-12 NOTE — Progress Notes (Signed)
Patient returns after reexcision lumpectomy. She she is one week out. Doing well. Margins are clear. Reexcision done for close margin secondary to DCIS.  Exam: Right breast incision clean, dry and intact. No signs of infection. Seroma minimal  Impression: 1 week status post reexcision right breast secondary to close margin after lumpectomy for invasive mammary carcinoma and DCIS  Plan: Continue oncology followup. Needs to see radiation oncology.  Follow up 3 months.

## 2014-06-16 ENCOUNTER — Ambulatory Visit (HOSPITAL_BASED_OUTPATIENT_CLINIC_OR_DEPARTMENT_OTHER): Payer: BC Managed Care – PPO

## 2014-06-16 ENCOUNTER — Ambulatory Visit (HOSPITAL_BASED_OUTPATIENT_CLINIC_OR_DEPARTMENT_OTHER): Payer: BC Managed Care – PPO | Admitting: Oncology

## 2014-06-16 ENCOUNTER — Ambulatory Visit: Payer: BC Managed Care – PPO

## 2014-06-16 ENCOUNTER — Other Ambulatory Visit (HOSPITAL_BASED_OUTPATIENT_CLINIC_OR_DEPARTMENT_OTHER): Payer: BC Managed Care – PPO

## 2014-06-16 ENCOUNTER — Ambulatory Visit: Payer: BC Managed Care – PPO | Admitting: Oncology

## 2014-06-16 ENCOUNTER — Other Ambulatory Visit: Payer: BC Managed Care – PPO

## 2014-06-16 VITALS — BP 138/86 | HR 83 | Temp 98.5°F | Resp 18 | Ht 67.0 in | Wt 213.8 lb

## 2014-06-16 DIAGNOSIS — C50419 Malignant neoplasm of upper-outer quadrant of unspecified female breast: Secondary | ICD-10-CM

## 2014-06-16 DIAGNOSIS — C50411 Malignant neoplasm of upper-outer quadrant of right female breast: Secondary | ICD-10-CM

## 2014-06-16 DIAGNOSIS — Z170 Estrogen receptor positive status [ER+]: Secondary | ICD-10-CM

## 2014-06-16 DIAGNOSIS — Z5112 Encounter for antineoplastic immunotherapy: Secondary | ICD-10-CM

## 2014-06-16 LAB — CBC WITH DIFFERENTIAL/PLATELET
BASO%: 0.7 % (ref 0.0–2.0)
BASOS ABS: 0 10*3/uL (ref 0.0–0.1)
EOS%: 1.3 % (ref 0.0–7.0)
Eosinophils Absolute: 0.1 10*3/uL (ref 0.0–0.5)
HEMATOCRIT: 35 % (ref 34.8–46.6)
HEMOGLOBIN: 11.2 g/dL — AB (ref 11.6–15.9)
LYMPH#: 1.5 10*3/uL (ref 0.9–3.3)
LYMPH%: 26.8 % (ref 14.0–49.7)
MCH: 26.6 pg (ref 25.1–34.0)
MCHC: 32.1 g/dL (ref 31.5–36.0)
MCV: 83.1 fL (ref 79.5–101.0)
MONO#: 0.4 10*3/uL (ref 0.1–0.9)
MONO%: 6.5 % (ref 0.0–14.0)
NEUT#: 3.7 10*3/uL (ref 1.5–6.5)
NEUT%: 64.7 % (ref 38.4–76.8)
Platelets: 223 10*3/uL (ref 145–400)
RBC: 4.21 10*6/uL (ref 3.70–5.45)
RDW: 15.2 % — ABNORMAL HIGH (ref 11.2–14.5)
WBC: 5.8 10*3/uL (ref 3.9–10.3)

## 2014-06-16 LAB — COMPREHENSIVE METABOLIC PANEL (CC13)
ALBUMIN: 3.4 g/dL — AB (ref 3.5–5.0)
ALT: 20 U/L (ref 0–55)
ANION GAP: 10 meq/L (ref 3–11)
AST: 17 U/L (ref 5–34)
Alkaline Phosphatase: 66 U/L (ref 40–150)
BUN: 13.3 mg/dL (ref 7.0–26.0)
CALCIUM: 8.8 mg/dL (ref 8.4–10.4)
CHLORIDE: 109 meq/L (ref 98–109)
CO2: 22 mEq/L (ref 22–29)
Creatinine: 0.8 mg/dL (ref 0.6–1.1)
GLUCOSE: 317 mg/dL — AB (ref 70–140)
POTASSIUM: 3.8 meq/L (ref 3.5–5.1)
Sodium: 141 mEq/L (ref 136–145)
Total Bilirubin: <0.2 mg/dL (ref 0.20–1.20)
Total Protein: 6.6 g/dL (ref 6.4–8.3)

## 2014-06-16 MED ORDER — ACETAMINOPHEN 325 MG PO TABS
ORAL_TABLET | ORAL | Status: AC
  Filled 2014-06-16: qty 2

## 2014-06-16 MED ORDER — SODIUM CHLORIDE 0.9 % IV SOLN
Freq: Once | INTRAVENOUS | Status: AC
  Administered 2014-06-16: 10:00:00 via INTRAVENOUS

## 2014-06-16 MED ORDER — ACETAMINOPHEN 325 MG PO TABS
650.0000 mg | ORAL_TABLET | Freq: Once | ORAL | Status: AC
  Administered 2014-06-16: 650 mg via ORAL

## 2014-06-16 MED ORDER — HEPARIN SOD (PORK) LOCK FLUSH 100 UNIT/ML IV SOLN
500.0000 [IU] | Freq: Once | INTRAVENOUS | Status: AC | PRN
  Administered 2014-06-16: 500 [IU]
  Filled 2014-06-16: qty 5

## 2014-06-16 MED ORDER — DIPHENHYDRAMINE HCL 25 MG PO CAPS
50.0000 mg | ORAL_CAPSULE | Freq: Once | ORAL | Status: AC
  Administered 2014-06-16: 50 mg via ORAL

## 2014-06-16 MED ORDER — TRASTUZUMAB CHEMO INJECTION 440 MG
2.0000 mg/kg | Freq: Once | INTRAVENOUS | Status: AC
  Administered 2014-06-16: 189 mg via INTRAVENOUS
  Filled 2014-06-16: qty 9

## 2014-06-16 MED ORDER — SODIUM CHLORIDE 0.9 % IJ SOLN
10.0000 mL | INTRAMUSCULAR | Status: DC | PRN
  Administered 2014-06-16: 10 mL
  Filled 2014-06-16: qty 10

## 2014-06-16 MED ORDER — DIPHENHYDRAMINE HCL 25 MG PO CAPS
ORAL_CAPSULE | ORAL | Status: AC
  Filled 2014-06-16: qty 2

## 2014-06-16 NOTE — Patient Instructions (Signed)

## 2014-06-16 NOTE — Progress Notes (Signed)
ID: [name] OB: 1951-03-13  MR#: [id number]  QKM#:[id number]  PCP: Ricke Hey, MD GYN:   SU: Dr. Brantley Stage OTHER MD:  CHIEF COMPLAINT:  Right-sided breast cancer  CURRENT THERAPY:receiving adjuvant chemotherapy  BREAST CANCER HISTORY: From Brandy original consult note:  "Brandy Jackson is a 63 y.o. female. Jackson palpated a right breast mass. She had a mammogram performed and she was noted to have a mass in Brandy upper outer quadrant measuring 2.1 x 2.0 x 1.7 cm. 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 Tumor was ER positive PR positive HER-2/neu positive (triple positive) with a proliferation marker Ki-67 at 90%. She had MRI of Brandy breasts performed which showed a solitary mass in Brandy upper outer quadrant measuring 2.5 cm. Her case was discussed at Brandy multidisciplinary breast conference."  Her subsequent treatment is as detailed below.   INTERVAL HISTORY:   Brandy Jackson presents accompanied her husband for a followup visit. She was started on adjuvant chemotherapy with carboplatin, docetaxel and trastuzumab given 04/28/2014, but then treatments were interrupted because of further surgery for margin clearance. They have not been resumed. She does continue on trastuzumab weekly. She is establishing herself on my service today.  REVIEW OF SYSTEMS:  Brandy Jackson did well with Brandy recent surgery, without any unusual bleeding, as pain or fever. With her first cycle of chemotherapy she had no nausea or vomiting, and no mouth sores. Her port worked "fine". She did have diarrhea on day 2 she recalls. Otherwise a detailed review of systems today was noncontributory   PAST MEDICAL HISTORY: Past Medical History  Diagnosis Date  . Diabetes mellitus without complication   . Arthritis   . Hot flashes   . Cancer   . Breast cancer   . Wears glasses   . Wears partial dentures     bottom    PAST SURGICAL HISTORY: Past Surgical History  Procedure  Laterality Date  . Bladder repair w/ cesarean section    . Colonoscopy    . Portacath placement N/A 04/11/2014    Procedure: INSERTION PORT-A-CATH;  Surgeon: Joyice Faster. Cornett, MD;  Location: [place];  Service: General;  Laterality: N/A;  . Re-excision of breast lumpectomy Right 06/06/2014    Procedure: RE-EXCISION OF BREAST LUMPECTOMY;  Surgeon: Marcello Moores A. Cornett, MD;  Location: [place];  Service: General;  Laterality: Right;    FAMILY HISTORY No family history on file.  GYNECOLOGIC HISTORY: menarche at age 53, G70 P3, went through menopause at age 74.  No h/o of HRT, no h/o abnormal pap smears, or sexually transmitted infections.  Does have frequent vaginal candida infections due to diabetes that she follows with her gynecologist for.    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 Her husband [name] is retired from [employer]. He is a cancer survivor himself. Brandy Jackson has 3 sons, one of whom is studying to be a pediatrician. All her children are married. She has 6 grandchildren.    ADVANCED DIRECTIVES: not in place.    HEALTH MAINTENANCE: History  Substance Use Topics  . Smoking status: Former Smoker -- 1.00 packs/day    Quit date: 03/08/1986  . Smokeless tobacco: Never Used  . Alcohol Use: No      Mammogram: Colonoscopy: Bone Density Scan:  Pap Smear:  Eye Exam:  Vitamin D Level:   Lipid Panel:    Allergies  Allergen Reactions  . Sulfa Antibiotics Rash    Current Outpatient Prescriptions  Medication Sig Dispense Refill  . dexamethasone (DECADRON) 4 MG tablet Take 2 tablets (8 mg total) by mouth 2 (two) times daily. Take Brandy day before chemo, then Brandy day after chemo x 3 days.  30 tablet  4  . lidocaine-prilocaine (EMLA) cream Apply topically as needed.  30 g  6  . LORazepam (ATIVAN) 0.5 MG tablet Take 1 tablet (0.5 mg total) by mouth every 8 (eight) hours.   30 tablet  0  . metFORMIN (GLUCOPHAGE) 500 MG tablet Take 500 mg by mouth 2 (two) times daily with a meal.      . ondansetron (ZOFRAN) 8 MG tablet Take 1 tablet (8 mg total) by mouth 2 (two) times daily. Take starting Brandy day after chemo for three days, then take twice a day PRN.  20 tablet  0  . oxyCODONE-acetaminophen (ROXICET) 5-325 MG per tablet Take 1 tablet by mouth every 4 (four) hours as needed.  30 tablet  0  . prochlorperazine (COMPAZINE) 10 MG tablet Take 1 tablet (10 mg total) by mouth every 6 (six) hours as needed for nausea or vomiting.  30 tablet  0   No current facility-administered medications for this visit.    OBJECTIVE: Middle-aged [place] American woman in no acute distress Filed Vitals:   06/16/14 0850  BP: 138/86  Pulse: 83  Temp: 98.5 F (36.9 C)  Resp: 18     Body mass index is 33.48 kg/(m^2).     Marland Kitchen ECOG FS:1 - Symptomatic but completely ambulatory  Sclerae unicteric, pupils equal and reactive Oropharynx clear and moist No cervical or supraclavicular adenopathy Lungs no rales or rhonchi Heart regular rate and rhythm Abd soft, nontender, positive bowel sounds MSK no focal spinal tenderness, no upper extremity lymphedema Neuro: nonfocal, well oriented, appropriate affect Breasts: Brandy right breast is status post lumpectomy. There is no evidence of local recurrence. Brandy cosmetic result is good. Brandy right axilla is benign. Brandy left breast is unremarkable t   LAB RESULTS:  CMP     Component Value Date/Time   NA 141 06/16/2014 0833   NA 142 04/07/2014 1320   K 3.8 06/16/2014 0833   K 4.2 04/07/2014 1320   CL 103 04/07/2014 1320   CO2 22 06/16/2014 0833   CO2 25 04/07/2014 1320   GLUCOSE 317* 06/16/2014 0833   GLUCOSE 126* 04/07/2014 1320   BUN 13.3 06/16/2014 0833   BUN 16 04/07/2014 1320   CREATININE 0.8 06/16/2014 0833   CREATININE 0.56 04/07/2014 1320   CALCIUM 8.8 06/16/2014 0833   CALCIUM 9.4 04/07/2014 1320   PROT 6.6 06/16/2014 0833   PROT 7.8 04/07/2014 1320    ALBUMIN 3.4* 06/16/2014 0833   ALBUMIN 3.9 04/07/2014 1320   AST 17 06/16/2014 0833   AST 20 04/07/2014 1320   ALT 20 06/16/2014 0833   ALT 19 04/07/2014 1320   ALKPHOS 66 06/16/2014 0833   ALKPHOS 68 04/07/2014 1320   BILITOT <0.20 06/16/2014 0833   BILITOT 0.2* 04/07/2014 1320   GFRNONAA >90 04/07/2014 1320   GFRAA >90 04/07/2014 1320    I No results found for this basename: SPEP,  UPEP,   kappa and lambda light chains    Lab Results  Component Value Date   WBC 5.8 06/16/2014   NEUTROABS 3.7 06/16/2014   HGB 11.2* 06/16/2014   HCT 35.0 06/16/2014   MCV 83.1 06/16/2014   PLT 223 06/16/2014  Chemistry      Component Value Date/Time   NA 141 06/16/2014 0833   NA 142 04/07/2014 1320   K 3.8 06/16/2014 0833   K 4.2 04/07/2014 1320   CL 103 04/07/2014 1320   CO2 22 06/16/2014 0833   CO2 25 04/07/2014 1320   BUN 13.3 06/16/2014 0833   BUN 16 04/07/2014 1320   CREATININE 0.8 06/16/2014 0833   CREATININE 0.56 04/07/2014 1320      Component Value Date/Time   CALCIUM 8.8 06/16/2014 0833   CALCIUM 9.4 04/07/2014 1320   ALKPHOS 66 06/16/2014 0833   ALKPHOS 68 04/07/2014 1320   AST 17 06/16/2014 0833   AST 20 04/07/2014 1320   ALT 20 06/16/2014 0833   ALT 19 04/07/2014 1320   BILITOT <0.20 06/16/2014 0833   BILITOT 0.2* 04/07/2014 1320       No results found for this basename: LABCA2    No components found with this basename: LABCA125    No results found for this basename: INR,  in Brandy last 168 hours  Urinalysis No results found for this basename: colorurine,  appearanceur,  labspec,  phurine,  glucoseu,  hgbur,  bilirubinur,  ketonesur,  proteinur,  urobilinogen,  nitrite,  leukocytesur    STUDIES: No results found.  ASSESSMENT: 63 y.o. [place],woman status post right lumpectomy and sentinel lymph node sampling 04/11/2014 for a pT2 pN1, stage IIB invasive ductal carcinoma, grade 3, estrogen receptor strongly positive, progesterone receptor negative, with an MIB-1 of 94% and HER-2 amplification  (1)  additional surgery in 06/06/2014 for margin clearance showed no evidence of residual malignancy  (2) received Brandy first of six planned cycles of adjuvant chemotherapy with carboplatin docetaxel and trastuzumab 05/02/2014, with chemotherapy interrupted because of her intervening surgery; chemo to be resumed 06/26/2014  (3) continuing trastuzumab through May 2016 to complete 1 year, most recent echocardiogram 04/05/2014 showed a normal ejection fraction  (4) Brandy Jackson will need adjuvant radiation at Brandy completion of chemotherapy  (5) she will start antiestrogen therapy at Brandy completion of radiation  PLAN:  We spent Brandy better part of today's hour-long appointment discussing Brandy biology of breast cancer in general, and Brandy specifics of Brandy Jackson's tumor in particular. Brandy Jackson was a little confused, understanding that she was "clear of cancer", when what she was actually told is that her margins were clear in Brandy June surgery. I went back to Brandy beginning pathology with her so she understands she has a stage IIB aggressive and fast-growing breast cancer, and she is at significant risk of already having distant spread, even though microscopic.  Brandy good news in her case is that chemotherapy will reduce her risk by one third, anti-estrogens by half, and anti-HER-2 by another half. Accordingly her final risk will be one sixth of Brandy initial risk of having Brandy disease recur outside of Brandy breast, namely as stage IV.  Once Brandy Jackson understood that she tells me she feels very motivated to continue and complete her chemotherapy treatments. She will receive Herceptin alone today, but she will receive Herceptin carboplatin and docetaxel next week. She will take her Decadron Brandy day before as instructed.  She also will need an echocardiogram sometime this month. That is being arranged.  Brandy Jackson has a good understanding of Brandy overall plan. She agrees with it. She knows Brandy goal of treatment in her case is  cure. She will call with any problems that may develop before her next visit here.    06/18/2014  10:43 AM

## 2014-06-19 ENCOUNTER — Telehealth: Payer: Self-pay | Admitting: Oncology

## 2014-06-19 NOTE — Telephone Encounter (Signed)
s/w pt re next appt for 7/17. pt will get new schedule when she comes in. echo to [facility] for preauth.

## 2014-06-21 ENCOUNTER — Telehealth: Payer: Self-pay | Admitting: Oncology

## 2014-06-21 NOTE — Telephone Encounter (Signed)
S/W PT RE NEXT APPT FOR 7/17 AND ECHO FOR 7/27. PT WILL GET NEW SCHEDULE 7/17. PER [facility] PREAUTH FOR ECHO [id number].

## 2014-06-22 ENCOUNTER — Telehealth: Payer: Self-pay | Admitting: *Deleted

## 2014-06-22 NOTE — Telephone Encounter (Signed)
Per pharmacy scheduled appt. 

## 2014-06-23 ENCOUNTER — Other Ambulatory Visit: Payer: BC Managed Care – PPO

## 2014-06-23 ENCOUNTER — Ambulatory Visit (HOSPITAL_BASED_OUTPATIENT_CLINIC_OR_DEPARTMENT_OTHER): Payer: BC Managed Care – PPO | Admitting: Hematology

## 2014-06-23 ENCOUNTER — Ambulatory Visit (HOSPITAL_BASED_OUTPATIENT_CLINIC_OR_DEPARTMENT_OTHER): Payer: BC Managed Care – PPO

## 2014-06-23 ENCOUNTER — Encounter: Payer: Self-pay | Admitting: Hematology

## 2014-06-23 ENCOUNTER — Other Ambulatory Visit (HOSPITAL_BASED_OUTPATIENT_CLINIC_OR_DEPARTMENT_OTHER): Payer: BC Managed Care – PPO

## 2014-06-23 VITALS — BP 153/65 | HR 84 | Temp 97.9°F | Resp 20 | Ht 67.0 in | Wt 210.8 lb

## 2014-06-23 DIAGNOSIS — C50419 Malignant neoplasm of upper-outer quadrant of unspecified female breast: Secondary | ICD-10-CM

## 2014-06-23 DIAGNOSIS — C50411 Malignant neoplasm of upper-outer quadrant of right female breast: Secondary | ICD-10-CM

## 2014-06-23 DIAGNOSIS — Z5112 Encounter for antineoplastic immunotherapy: Secondary | ICD-10-CM

## 2014-06-23 DIAGNOSIS — Z170 Estrogen receptor positive status [ER+]: Secondary | ICD-10-CM

## 2014-06-23 DIAGNOSIS — Z5111 Encounter for antineoplastic chemotherapy: Secondary | ICD-10-CM

## 2014-06-23 DIAGNOSIS — C50919 Malignant neoplasm of unspecified site of unspecified female breast: Secondary | ICD-10-CM

## 2014-06-23 LAB — COMPREHENSIVE METABOLIC PANEL (CC13)
ALBUMIN: 3.7 g/dL (ref 3.5–5.0)
ALK PHOS: 86 U/L (ref 40–150)
ALT: 32 U/L (ref 0–55)
AST: 19 U/L (ref 5–34)
Anion Gap: 12 mEq/L — ABNORMAL HIGH (ref 3–11)
BILIRUBIN TOTAL: 0.21 mg/dL (ref 0.20–1.20)
BUN: 16.3 mg/dL (ref 7.0–26.0)
CO2: 20 mEq/L — ABNORMAL LOW (ref 22–29)
Calcium: 9.3 mg/dL (ref 8.4–10.4)
Chloride: 107 mEq/L (ref 98–109)
Creatinine: 1 mg/dL (ref 0.6–1.1)
Glucose: 499 mg/dl — ABNORMAL HIGH (ref 70–140)
Potassium: 4 mEq/L (ref 3.5–5.1)
Sodium: 139 mEq/L (ref 136–145)
Total Protein: 7.4 g/dL (ref 6.4–8.3)

## 2014-06-23 LAB — CBC WITH DIFFERENTIAL/PLATELET
BASO%: 0.3 % (ref 0.0–2.0)
BASOS ABS: 0 10*3/uL (ref 0.0–0.1)
EOS ABS: 0 10*3/uL (ref 0.0–0.5)
EOS%: 0 % (ref 0.0–7.0)
HCT: 39.2 % (ref 34.8–46.6)
HEMOGLOBIN: 12.4 g/dL (ref 11.6–15.9)
LYMPH%: 11 % — AB (ref 14.0–49.7)
MCH: 26.4 pg (ref 25.1–34.0)
MCHC: 31.5 g/dL (ref 31.5–36.0)
MCV: 83.8 fL (ref 79.5–101.0)
MONO#: 0.1 10*3/uL (ref 0.1–0.9)
MONO%: 0.8 % (ref 0.0–14.0)
NEUT#: 6.9 10*3/uL — ABNORMAL HIGH (ref 1.5–6.5)
NEUT%: 87.9 % — AB (ref 38.4–76.8)
PLATELETS: 267 10*3/uL (ref 145–400)
RBC: 4.68 10*6/uL (ref 3.70–5.45)
RDW: 15.2 % — ABNORMAL HIGH (ref 11.2–14.5)
WBC: 7.9 10*3/uL (ref 3.9–10.3)
lymph#: 0.9 10*3/uL (ref 0.9–3.3)

## 2014-06-23 MED ORDER — DEXAMETHASONE SODIUM PHOSPHATE 20 MG/5ML IJ SOLN
20.0000 mg | Freq: Once | INTRAMUSCULAR | Status: AC
  Administered 2014-06-23: 20 mg via INTRAVENOUS

## 2014-06-23 MED ORDER — ACETAMINOPHEN 325 MG PO TABS
ORAL_TABLET | ORAL | Status: AC
  Filled 2014-06-23: qty 2

## 2014-06-23 MED ORDER — ACETAMINOPHEN 325 MG PO TABS
ORAL_TABLET | ORAL | Status: AC
  Filled 2014-06-23: qty 1

## 2014-06-23 MED ORDER — SODIUM CHLORIDE 0.9 % IJ SOLN
10.0000 mL | INTRAMUSCULAR | Status: DC | PRN
  Administered 2014-06-23: 10 mL
  Filled 2014-06-23: qty 10

## 2014-06-23 MED ORDER — SODIUM CHLORIDE 0.9 % IV SOLN
680.0000 mg | Freq: Once | INTRAVENOUS | Status: AC
  Administered 2014-06-23: 680 mg via INTRAVENOUS
  Filled 2014-06-23: qty 68

## 2014-06-23 MED ORDER — ONDANSETRON 16 MG/50ML IVPB (CHCC)
INTRAVENOUS | Status: AC
  Filled 2014-06-23: qty 16

## 2014-06-23 MED ORDER — HEPARIN SOD (PORK) LOCK FLUSH 100 UNIT/ML IV SOLN
500.0000 [IU] | Freq: Once | INTRAVENOUS | Status: AC | PRN
  Administered 2014-06-23: 500 [IU]
  Filled 2014-06-23: qty 5

## 2014-06-23 MED ORDER — DEXTROSE 5 % IV SOLN
75.0000 mg/m2 | Freq: Once | INTRAVENOUS | Status: AC
  Administered 2014-06-23: 160 mg via INTRAVENOUS
  Filled 2014-06-23: qty 16

## 2014-06-23 MED ORDER — ONDANSETRON 16 MG/50ML IVPB (CHCC)
16.0000 mg | Freq: Once | INTRAVENOUS | Status: AC
  Administered 2014-06-23: 16 mg via INTRAVENOUS

## 2014-06-23 MED ORDER — DIPHENHYDRAMINE HCL 25 MG PO CAPS
ORAL_CAPSULE | ORAL | Status: AC
  Filled 2014-06-23: qty 2

## 2014-06-23 MED ORDER — DIPHENHYDRAMINE HCL 25 MG PO CAPS
50.0000 mg | ORAL_CAPSULE | Freq: Once | ORAL | Status: AC
  Administered 2014-06-23: 50 mg via ORAL

## 2014-06-23 MED ORDER — TRASTUZUMAB CHEMO INJECTION 440 MG
2.0000 mg/kg | Freq: Once | INTRAVENOUS | Status: AC
  Administered 2014-06-23: 189 mg via INTRAVENOUS
  Filled 2014-06-23: qty 9

## 2014-06-23 MED ORDER — DEXAMETHASONE SODIUM PHOSPHATE 20 MG/5ML IJ SOLN
INTRAMUSCULAR | Status: AC
  Filled 2014-06-23: qty 5

## 2014-06-23 MED ORDER — ACETAMINOPHEN 325 MG PO TABS
650.0000 mg | ORAL_TABLET | Freq: Once | ORAL | Status: AC
  Administered 2014-06-23: 650 mg via ORAL

## 2014-06-23 MED ORDER — SODIUM CHLORIDE 0.9 % IV SOLN
Freq: Once | INTRAVENOUS | Status: AC
  Administered 2014-06-23: 11:00:00 via INTRAVENOUS

## 2014-06-23 NOTE — Patient Instructions (Signed)
[facility] Discharge Instructions for Patients Receiving Chemotherapy  Today you received the following chemotherapy agents Docetaxel/Carboplatin/Herceptin  To help prevent nausea and vomiting after your treatment, we encourage you to take your nausea medication as directed.    If you develop nausea and vomiting that is not controlled by your nausea medication, call the clinic.   BELOW ARE SYMPTOMS THAT SHOULD BE REPORTED IMMEDIATELY:  *FEVER GREATER THAN 100.5 F  *CHILLS WITH OR WITHOUT FEVER  NAUSEA AND VOMITING THAT IS NOT CONTROLLED WITH YOUR NAUSEA MEDICATION  *UNUSUAL SHORTNESS OF BREATH  *UNUSUAL BRUISING OR BLEEDING  TENDERNESS IN MOUTH AND THROAT WITH OR WITHOUT PRESENCE OF ULCERS  *URINARY PROBLEMS  *BOWEL PROBLEMS  UNUSUAL RASH Items with * indicate a potential emergency and should be followed up as soon as possible.  Feel free to call the clinic you have any questions or concerns. The clinic phone number is [phone].

## 2014-06-23 NOTE — Progress Notes (Signed)
ID: [name] OB: 10/16/1951  MR#: [id number]  GBT#:[id number]  PCP: Ricke Hey, MD GYN:   SU: Dr. Brantley Stage OTHER MD:  CHIEF COMPLAINT:  Right-sided breast cancer  CURRENT THERAPY:receiving adjuvant chemotherapy [place] C2,D1 today.  BREAST CANCER HISTORY:  Brandy Jackson is a 63 y.o. female. Patient palpated a right breast mass. She had a mammogram performed and she was noted to have a mass in the upper outer quadrant measuring 2.1 x 2.0 x 1.7 cm. 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 Tumor was ER positive PR positive HER-2/neu positive (triple positive) with a proliferation marker Ki-67 at 90%. She had MRI of the breasts performed which showed a solitary mass in the upper outer quadrant measuring 2.5 cm. She is status post right lumpectomy and sentinel lymph node sampling 04/11/2014 for a pT2 pN1, stage IIB invasive ductal carcinoma, grade 3, estrogen receptor strongly positive, progesterone receptor negative, with an MIB-1 of 94% and HER-2 amplification. She required  additional surgery in 06/06/2014 for margin clearance showed no evidence of residual malignancy. She started the first of six planned cycles of adjuvant chemotherapy with carboplatin docetaxel and trastuzumab 05/02/2014, with chemotherapy interrupted because of her intervening surgery; Today is her Cycle 2 day 1 of TCH regimen. She will continue trastuzumab through May 2016 to complete 1 year, most recent echocardiogram 04/05/2014 showed a normal ejection fraction. The patient will need adjuvant radiation at the completion of chemotherapy. She will start antiestrogen therapy at the completion of radiation.  INTERVAL HISTORY:   Brandy Jackson presents accompanied her husband for a followup visit. She was started on adjuvant chemotherapy with carboplatin, docetaxel and trastuzumab given 04/28/2014, but then treatments were interrupted because of further surgery for margin clearance. Patient  tolerated her first cycle of [place] well. She does complain of mild lower back pain which was caused by Neulasta injection. She'll had mild sore throat from a second surgery but that has improved. She is in good spirits.   REVIEW OF SYSTEMS:  The patient did well with the recent surgery, without any unusual bleeding, as pain or fever. With her first cycle of chemotherapy she had no nausea or vomiting, and no mouth sores. Her port worked "fine". She did have diarrhea on day 2 she recalls. Patient denies any symptoms of fever, fatigue, weight loss, anorexia. She denies any shortness of breath cough or pleurisy. She denies any chest pain, palpitations, orthopnea or PND. She denies any rash or problems with her Mediport. She getting her cycle 2 day 1 of chemotherapy today.    Otherwise a detailed review of systems today was noncontributory   PAST MEDICAL HISTORY: Past Medical History  Diagnosis Date  . Diabetes mellitus without complication   . Arthritis   . Hot flashes   . Cancer   . Breast cancer   . Wears glasses   . Wears partial dentures     bottom    PAST SURGICAL HISTORY: Past Surgical History  Procedure Laterality Date  . Bladder repair w/ cesarean section    . Colonoscopy    . Portacath placement N/A 04/11/2014    Procedure: INSERTION PORT-A-CATH;  Surgeon: Joyice Faster. Cornett, MD;  Location: [place];  Service: General;  Laterality: N/A;  . Re-excision of breast lumpectomy Right 06/06/2014    Procedure: RE-EXCISION OF BREAST LUMPECTOMY;  Surgeon: Marcello Moores A. Cornett, MD;  Location: [place];  Service: General;  Laterality: Right;    FAMILY HISTORY No  family history on file.  GYNECOLOGIC HISTORY: menarche at age 17, G54 P3, went through menopause at age 51.  No h/o of HRT, no h/o abnormal pap smears, or sexually transmitted infections.  Does have frequent vaginal candida infections due to diabetes that she follows with her gynecologist for.    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 Her husband Brandy Jackson is retired from [employer]. He is a cancer survivor himself. The patient has 3 sons, one of whom is studying to be a pediatrician. All her children are married. She has 6 grandchildren.    ADVANCED DIRECTIVES: not in place.    HEALTH MAINTENANCE: History  Substance Use Topics  . Smoking status: Former Smoker -- 1.00 packs/day    Quit date: 03/08/1986  . Smokeless tobacco: Never Used  . Alcohol Use: No     Allergies  Allergen Reactions  . Sulfa Antibiotics Rash      OBJECTIVE: Middle-aged African American woman in no acute distress Filed Vitals:   06/23/14 0942  BP: 153/65  Pulse: 84  Temp: 97.9 F (36.6 C)  Resp: 20     Body mass index is 33.01 kg/(m^2).     Marland Kitchen ECOG FS:1 - Symptomatic but completely ambulatory  Sclerae unicteric, pupils equal and reactive Oropharynx clear and moist No cervical or supraclavicular adenopathy Lungs no rales or rhonchi Heart regular rate and rhythm Abd soft, nontender, positive bowel sounds MSK no focal spinal tenderness, no upper extremity lymphedema Neuro: nonfocal, well oriented, appropriate affect Breasts: The right breast is status post lumpectomy. There is no evidence of local recurrence. The cosmetic result is good. The right axilla is benign. The left breast is unremarkable t   LAB RESULTS:  CMP     Component Value Date/Time   NA 139 06/23/2014 0903   NA 142 04/07/2014 1320   K 4.0 06/23/2014 0903   K 4.2 04/07/2014 1320   CL 103 04/07/2014 1320   CO2 20* 06/23/2014 0903   CO2 25 04/07/2014 1320   GLUCOSE 499* 06/23/2014 0903   GLUCOSE 126* 04/07/2014 1320   BUN 16.3 06/23/2014 0903   BUN 16 04/07/2014 1320   CREATININE 1.0 06/23/2014 0903   CREATININE 0.56 04/07/2014 1320   CALCIUM 9.3 06/23/2014 0903   CALCIUM 9.4 04/07/2014 1320   PROT 7.4 06/23/2014 0903   PROT 7.8 04/07/2014 1320   ALBUMIN 3.7 06/23/2014  0903   ALBUMIN 3.9 04/07/2014 1320   AST 19 06/23/2014 0903   AST 20 04/07/2014 1320   ALT 32 06/23/2014 0903   ALT 19 04/07/2014 1320   ALKPHOS 86 06/23/2014 0903   ALKPHOS 68 04/07/2014 1320   BILITOT 0.21 06/23/2014 0903   BILITOT 0.2* 04/07/2014 1320   GFRNONAA >90 04/07/2014 1320   GFRAA >90 04/07/2014 1320    I   Lab Results  Component Value Date   WBC 7.9 06/23/2014   NEUTROABS 6.9* 06/23/2014   HGB 12.4 06/23/2014   HCT 39.2 06/23/2014   MCV 83.8 06/23/2014   PLT 267 06/23/2014      Chemistry      Component Value Date/Time   NA 139 06/23/2014 0903   NA 142 04/07/2014 1320   K 4.0 06/23/2014 0903   K 4.2 04/07/2014 1320   CL 103 04/07/2014 1320   CO2 20* 06/23/2014 0903   CO2 25 04/07/2014 1320   BUN 16.3 06/23/2014 0903   BUN 16 04/07/2014  1320   CREATININE 1.0 06/23/2014 0903   CREATININE 0.56 04/07/2014 1320      Component Value Date/Time   CALCIUM 9.3 06/23/2014 0903   CALCIUM 9.4 04/07/2014 1320   ALKPHOS 86 06/23/2014 0903   ALKPHOS 68 04/07/2014 1320   AST 19 06/23/2014 0903   AST 20 04/07/2014 1320   ALT 32 06/23/2014 0903   ALT 19 04/07/2014 1320   BILITOT 0.21 06/23/2014 0903   BILITOT 0.2* 04/07/2014 1320       STUDIES:  LV EF: 56% on echo done on 04/05/2014.   ASSESSMENT: 63 y.o. [place],woman status post right lumpectomy and sentinel lymph node sampling 04/11/2014 for a pT2 pN1, stage IIB invasive ductal carcinoma, grade 3, estrogen receptor strongly positive, progesterone receptor negative, with an MIB-1 of 94% and HER-2 amplification  (1) additional surgery in 06/06/2014 for margin clearance showed no evidence of residual malignancy  (2) received the first of six planned cycles of adjuvant chemotherapy with carboplatin docetaxel and trastuzumab 05/02/2014, with chemotherapy interrupted because of her intervening surgery; chemo to be resumed 06/23/2014 CYCLE 2 DAY 1 TODAY.  (3) continuing trastuzumab through May 2016 to complete 1 year, most recent echocardiogram 04/05/2014  showed a normal ejection fraction  (4) the patient will need adjuvant radiation at the completion of chemotherapy  (5) she will start antiestrogen therapy at the completion of radiation  PLAN:   1. Continue adjuvant chemotherapy TCH with curative potential C2D1 today. Herceptin is weekly and TC is q 3 weeks. 2. Her labs were reviewed and are normal and OK to proceed with chemotherapy. 3. Her physical exam was fine. 4. Last echo in April showed a preserved EF 56%. Another echo coming up on 07/03/2014. 5. Patient's blood sugar is elevated"499" and she has diabetes so better glycemic control is needed during chemotherapy. 6. RTC clinic 3 weeks for [place], MD Medical Hematologist/Oncologist [place] Pager: [phone] Office No: [phone]   06/23/2014 6:12 PM

## 2014-06-24 ENCOUNTER — Ambulatory Visit (HOSPITAL_BASED_OUTPATIENT_CLINIC_OR_DEPARTMENT_OTHER): Payer: BC Managed Care – PPO

## 2014-06-24 VITALS — BP 140/66 | HR 76 | Temp 98.3°F | Resp 18

## 2014-06-24 DIAGNOSIS — C50411 Malignant neoplasm of upper-outer quadrant of right female breast: Secondary | ICD-10-CM

## 2014-06-24 DIAGNOSIS — C50419 Malignant neoplasm of upper-outer quadrant of unspecified female breast: Secondary | ICD-10-CM

## 2014-06-24 DIAGNOSIS — Z5189 Encounter for other specified aftercare: Secondary | ICD-10-CM

## 2014-06-24 MED ORDER — PEGFILGRASTIM INJECTION 6 MG/0.6ML
6.0000 mg | Freq: Once | SUBCUTANEOUS | Status: AC
  Administered 2014-06-24: 6 mg via SUBCUTANEOUS

## 2014-06-24 NOTE — Patient Instructions (Signed)

## 2014-06-27 ENCOUNTER — Other Ambulatory Visit: Payer: Self-pay | Admitting: *Deleted

## 2014-06-27 DIAGNOSIS — C50411 Malignant neoplasm of upper-outer quadrant of right female breast: Secondary | ICD-10-CM

## 2014-06-27 MED ORDER — DEXAMETHASONE 4 MG PO TABS: 8.0000 mg | ORAL_TABLET | Freq: Two times a day (BID) | ORAL | Status: DC

## 2014-06-28 ENCOUNTER — Other Ambulatory Visit: Payer: Self-pay | Admitting: *Deleted

## 2014-06-28 DIAGNOSIS — C50411 Malignant neoplasm of upper-outer quadrant of right female breast: Secondary | ICD-10-CM

## 2014-06-28 MED ORDER — ONDANSETRON HCL 8 MG PO TABS: 8.0000 mg | ORAL_TABLET | Freq: Two times a day (BID) | ORAL | Status: DC

## 2014-06-28 NOTE — Addendum Note (Signed)
Addended by: Cherylynn Ridges on: 06/28/2014 10:08 AM   Modules accepted: Orders

## 2014-06-28 NOTE — Telephone Encounter (Signed)
Call received from patient requesting "refill on zofran for diarrhea.  I've been calling my pharmacy since Sunday and unable to get a refill.  Diarrhea since Sunday.  Could I also have 40 tablets instead of twenty?"   Informed her zofran is for nausea and vomiting.  "Small amount of blood on tissue and zofran is the only thing that helps so what should I take for bowels?"  Imodium recommended and she does have imodium on hand.  Advised that she go to ER for blood in stool.  "I will go to ER if it happens again."  Refill sent to [facility] at this time.

## 2014-06-29 ENCOUNTER — Telehealth: Payer: Self-pay | Admitting: Oncology

## 2014-06-29 NOTE — Telephone Encounter (Signed)
pt already on schedule for f/u in 3wks w/TCH (per 7/17 pof). added inj appts for 8/8 and 8/29. all other appts remain the same. lmonvm for pt and pt to get new schedule tomorrow.

## 2014-06-30 ENCOUNTER — Other Ambulatory Visit (HOSPITAL_BASED_OUTPATIENT_CLINIC_OR_DEPARTMENT_OTHER): Payer: BC Managed Care – PPO

## 2014-06-30 ENCOUNTER — Ambulatory Visit (HOSPITAL_BASED_OUTPATIENT_CLINIC_OR_DEPARTMENT_OTHER): Payer: BC Managed Care – PPO

## 2014-06-30 ENCOUNTER — Other Ambulatory Visit: Payer: BC Managed Care – PPO

## 2014-06-30 ENCOUNTER — Encounter: Payer: Self-pay | Admitting: Hematology

## 2014-06-30 ENCOUNTER — Ambulatory Visit (HOSPITAL_BASED_OUTPATIENT_CLINIC_OR_DEPARTMENT_OTHER): Payer: BC Managed Care – PPO | Admitting: Hematology

## 2014-06-30 VITALS — BP 136/66 | HR 75 | Temp 97.7°F | Resp 20

## 2014-06-30 VITALS — BP 141/73 | HR 85 | Temp 98.2°F | Resp 20 | Ht 67.0 in | Wt 211.2 lb

## 2014-06-30 DIAGNOSIS — E119 Type 2 diabetes mellitus without complications: Secondary | ICD-10-CM

## 2014-06-30 DIAGNOSIS — C50419 Malignant neoplasm of upper-outer quadrant of unspecified female breast: Secondary | ICD-10-CM

## 2014-06-30 DIAGNOSIS — K625 Hemorrhage of anus and rectum: Secondary | ICD-10-CM

## 2014-06-30 DIAGNOSIS — C50411 Malignant neoplasm of upper-outer quadrant of right female breast: Secondary | ICD-10-CM

## 2014-06-30 DIAGNOSIS — Z5112 Encounter for antineoplastic immunotherapy: Secondary | ICD-10-CM

## 2014-06-30 HISTORY — DX: Hemorrhage of anus and rectum: K62.5

## 2014-06-30 LAB — CBC WITH DIFFERENTIAL/PLATELET
BASO%: 0.7 % (ref 0.0–2.0)
BASOS ABS: 0.1 10*3/uL (ref 0.0–0.1)
EOS ABS: 0.1 10*3/uL (ref 0.0–0.5)
EOS%: 0.3 % (ref 0.0–7.0)
HEMATOCRIT: 37.9 % (ref 34.8–46.6)
HGB: 11.9 g/dL (ref 11.6–15.9)
LYMPH%: 18.4 % (ref 14.0–49.7)
MCH: 26 pg (ref 25.1–34.0)
MCHC: 31.5 g/dL (ref 31.5–36.0)
MCV: 82.7 fL (ref 79.5–101.0)
MONO#: 1.7 10*3/uL — ABNORMAL HIGH (ref 0.1–0.9)
MONO%: 9.2 % (ref 0.0–14.0)
NEUT#: 13 10*3/uL — ABNORMAL HIGH (ref 1.5–6.5)
NEUT%: 71.4 % (ref 38.4–76.8)
Platelets: 174 10*3/uL (ref 145–400)
RBC: 4.58 10*6/uL (ref 3.70–5.45)
RDW: 15.1 % — ABNORMAL HIGH (ref 11.2–14.5)
WBC: 18.2 10*3/uL — AB (ref 3.9–10.3)
lymph#: 3.4 10*3/uL — ABNORMAL HIGH (ref 0.9–3.3)

## 2014-06-30 LAB — COMPREHENSIVE METABOLIC PANEL (CC13)
ALBUMIN: 3.5 g/dL (ref 3.5–5.0)
ALT: 148 U/L — ABNORMAL HIGH (ref 0–55)
ANION GAP: 10 meq/L (ref 3–11)
AST: 77 U/L — ABNORMAL HIGH (ref 5–34)
Alkaline Phosphatase: 121 U/L (ref 40–150)
BUN: 11.5 mg/dL (ref 7.0–26.0)
CO2: 24 meq/L (ref 22–29)
CREATININE: 0.7 mg/dL (ref 0.6–1.1)
Calcium: 9 mg/dL (ref 8.4–10.4)
Chloride: 104 mEq/L (ref 98–109)
GLUCOSE: 220 mg/dL — AB (ref 70–140)
POTASSIUM: 3.9 meq/L (ref 3.5–5.1)
Sodium: 138 mEq/L (ref 136–145)
Total Bilirubin: <0.2 mg/dL (ref 0.20–1.20)
Total Protein: 6.8 g/dL (ref 6.4–8.3)

## 2014-06-30 MED ORDER — SODIUM CHLORIDE 0.9 % IV SOLN
2.0000 mg/kg | Freq: Once | INTRAVENOUS | Status: AC
  Administered 2014-06-30: 189 mg via INTRAVENOUS
  Filled 2014-06-30: qty 9

## 2014-06-30 MED ORDER — SODIUM CHLORIDE 0.9 % IJ SOLN
10.0000 mL | INTRAMUSCULAR | Status: DC | PRN
  Administered 2014-06-30: 10 mL
  Filled 2014-06-30: qty 10

## 2014-06-30 MED ORDER — ACETAMINOPHEN 325 MG PO TABS
ORAL_TABLET | ORAL | Status: AC
  Filled 2014-06-30: qty 2

## 2014-06-30 MED ORDER — DIPHENHYDRAMINE HCL 25 MG PO CAPS
ORAL_CAPSULE | ORAL | Status: AC
  Filled 2014-06-30: qty 2

## 2014-06-30 MED ORDER — ACETAMINOPHEN 325 MG PO TABS
650.0000 mg | ORAL_TABLET | Freq: Once | ORAL | Status: AC
  Administered 2014-06-30: 650 mg via ORAL

## 2014-06-30 MED ORDER — DIPHENHYDRAMINE HCL 25 MG PO CAPS
50.0000 mg | ORAL_CAPSULE | Freq: Once | ORAL | Status: AC
  Administered 2014-06-30: 50 mg via ORAL

## 2014-06-30 MED ORDER — SODIUM CHLORIDE 0.9 % IV SOLN
Freq: Once | INTRAVENOUS | Status: AC
  Administered 2014-06-30: 11:00:00 via INTRAVENOUS

## 2014-06-30 MED ORDER — HEPARIN SOD (PORK) LOCK FLUSH 100 UNIT/ML IV SOLN
500.0000 [IU] | Freq: Once | INTRAVENOUS | Status: AC | PRN
  Administered 2014-06-30: 500 [IU]
  Filled 2014-06-30: qty 5

## 2014-06-30 NOTE — Patient Instructions (Signed)
[facility] Discharge Instructions for Patients Receiving Chemotherapy  Today you received the following chemotherapy agents: Herceptin  To help prevent nausea and vomiting after your treatment, we encourage you to take your nausea medication as prescribed by your physician.    If you develop nausea and vomiting that is not controlled by your nausea medication, call the clinic.   BELOW ARE SYMPTOMS THAT SHOULD BE REPORTED IMMEDIATELY:  *FEVER GREATER THAN 100.5 F  *CHILLS WITH OR WITHOUT FEVER  NAUSEA AND VOMITING THAT IS NOT CONTROLLED WITH YOUR NAUSEA MEDICATION  *UNUSUAL SHORTNESS OF BREATH  *UNUSUAL BRUISING OR BLEEDING  TENDERNESS IN MOUTH AND THROAT WITH OR WITHOUT PRESENCE OF ULCERS  *URINARY PROBLEMS  *BOWEL PROBLEMS  UNUSUAL RASH Items with * indicate a potential emergency and should be followed up as soon as possible.  Feel free to call the clinic you have any questions or concerns. The clinic phone number is [phone].    

## 2014-06-30 NOTE — Progress Notes (Signed)
ID: [name] OB: [birth year]  MR#: [id number]  ACZ#:[id number]  PCP: Ricke Hey, MD GYN:   SU: Dr. Brantley Stage OTHER MD:  CHIEF COMPLAINT:  Right-sided breast cancer  CURRENT THERAPY:receiving adjuvant chemotherapy [place] C2,D8 today.  BREAST CANCER HISTORY:  Brandy Jackson is a 63 y.o. female. Patient palpated a right breast mass. She had a mammogram performed and she was noted to have a mass in the upper outer quadrant measuring 2.1 x 2.0 x 1.7 cm. 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 Tumor was ER positive PR positive HER-2/neu positive (triple positive) with a proliferation marker Ki-67 at 90%. She had MRI of the breasts performed which showed a solitary mass in the upper outer quadrant measuring 2.5 cm. She is status post right lumpectomy and sentinel lymph node sampling 04/11/2014 for a pT2 pN1, stage IIB invasive ductal carcinoma, grade 3, estrogen receptor strongly positive, progesterone receptor negative, with an MIB-1 of 94% and HER-2 amplification. She required  additional surgery in 06/06/2014 for margin clearance showed no evidence of residual malignancy. She started the first of six planned cycles of adjuvant chemotherapy with carboplatin docetaxel and trastuzumab 05/02/2014, with chemotherapy interrupted because of her intervening surgery; Today is her Cycle 2 day 1 of TCH regimen. She will continue trastuzumab through May 2016 to complete 1 year, most recent echocardiogram 04/05/2014 showed a normal ejection fraction. The patient will need adjuvant radiation at the completion of chemotherapy. She will start antiestrogen therapy at the completion of radiation.  INTERVAL HISTORY:   Brandy Jackson presents accompanied her husband for a followup visit. She was started on adjuvant chemotherapy with carboplatin, docetaxel and trastuzumab given 04/28/2014, but then treatments were interrupted because of further surgery for margin clearance. Patient  tolerated her first cycle of [place] well. Patient had 2 episodes of mild BRBPR which subsided. She did have hemorrhoids after her 3rd child and chemo can flare up that problem. Her platelets have been normal and she does not take any NSAID. Her blood sugar last week was 499 and today is 220. I have instructed her to increase her metformin to 1000 mg po bid after looking at her sugars for last several weeks and to do a more close monitoring. Her colonoscopy was a while ago and it was normal.  REVIEW OF SYSTEMS:  The patient did well with the recent surgery, without any unusual bleeding, as pain or fever. With her first cycle of chemotherapy she had no nausea or vomiting, and no mouth sores. Her port worked "fine".2 episodes of rectal bleeding. Patient denies any symptoms of fever, fatigue, weight loss, anorexia. She denies any shortness of breath cough or pleurisy. She denies any chest pain, palpitations, orthopnea or PND. She denies any rash or problems with her Mediport. She getting her cycle 2 day 8 of chemotherapy today.    Otherwise a detailed review of systems today was noncontributory   PAST MEDICAL HISTORY: Past Medical History  Diagnosis Date  . Diabetes mellitus without complication   . Arthritis   . Hot flashes   . Cancer   . Breast cancer   . Wears glasses   . Wears partial dentures     bottom    PAST SURGICAL HISTORY: Past Surgical History  Procedure Laterality Date  . Bladder repair w/ cesarean section    . Colonoscopy    . Portacath placement N/A 04/11/2014    Procedure: INSERTION PORT-A-CATH;  Surgeon: Joyice Faster. Cornett, MD;  Location: [facility];  Service: General;  Laterality: N/A;  . Re-excision of breast lumpectomy Right 06/06/2014    Procedure: RE-EXCISION OF BREAST LUMPECTOMY;  Surgeon: Marcello Moores A. Cornett, MD;  Location: [facility];  Service: General;  Laterality: Right;    FAMILY HISTORY No family history on file.  GYNECOLOGIC HISTORY:  menarche at age 32, G52 P3, went through menopause at age 35.  No h/o of HRT, no h/o abnormal pap smears, or sexually transmitted infections.  Does have frequent vaginal candida infections due to diabetes that she follows with her gynecologist for.    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 Her husband [name] is retired from [employer]. He is a cancer survivor himself. The patient has 3 sons, one of whom is studying to be a pediatrician. All her children are married. She has 6 grandchildren.    ADVANCED DIRECTIVES: not in place.    HEALTH MAINTENANCE: History  Substance Use Topics  . Smoking status: Former Smoker -- 1.00 packs/day    Quit date: 03/08/1986  . Smokeless tobacco: Never Used  . Alcohol Use: No     Allergies  Allergen Reactions  . Sulfa Antibiotics Rash      OBJECTIVE: Middle-aged African American woman in no acute distress Filed Vitals:   06/30/14 0847  BP: 141/73  Pulse: 85  Temp: 98.2 F (36.8 C)  Resp: 20     Body mass index is 33.07 kg/(m^2).     Marland Kitchen ECOG FS:1 - Symptomatic but completely ambulatory  Sclerae unicteric, pupils equal and reactive Oropharynx clear and moist No cervical or supraclavicular adenopathy Lungs no rales or rhonchi Heart regular rate and rhythm Abd soft, nontender, positive bowel sounds MSK no focal spinal tenderness, no upper extremity lymphedema Neuro: nonfocal, well oriented, appropriate affect Breasts: The right breast is status post lumpectomy. There is no evidence of local recurrence. The cosmetic result is good. The right axilla is benign. The left breast is unremarkable t   LAB RESULTS:  CMP     Component Value Date/Time   NA 138 06/30/2014 0826   NA 142 04/07/2014 1320   K 3.9 06/30/2014 0826   K 4.2 04/07/2014 1320   CL 103 04/07/2014 1320   CO2 24 06/30/2014 0826   CO2 25 04/07/2014 1320   GLUCOSE 220* 06/30/2014 0826   GLUCOSE 126* 04/07/2014  1320   BUN 11.5 06/30/2014 0826   BUN 16 04/07/2014 1320   CREATININE 0.7 06/30/2014 0826   CREATININE 0.56 04/07/2014 1320   CALCIUM 9.0 06/30/2014 0826   CALCIUM 9.4 04/07/2014 1320   PROT 6.8 06/30/2014 0826   PROT 7.8 04/07/2014 1320   ALBUMIN 3.5 06/30/2014 0826   ALBUMIN 3.9 04/07/2014 1320   AST 77* 06/30/2014 0826   AST 20 04/07/2014 1320   ALT 148* 06/30/2014 0826   ALT 19 04/07/2014 1320   ALKPHOS 121 06/30/2014 0826   ALKPHOS 68 04/07/2014 1320   BILITOT <0.20 06/30/2014 0826   BILITOT 0.2* 04/07/2014 1320   GFRNONAA >90 04/07/2014 1320   GFRAA >90 04/07/2014 1320    I   Lab Results  Component Value Date   WBC 18.2* 06/30/2014   NEUTROABS 13.0* 06/30/2014   HGB 11.9 06/30/2014   HCT 37.9 06/30/2014   MCV 82.7 06/30/2014   PLT 174 06/30/2014      Chemistry      Component Value Date/Time   NA 138 06/30/2014  0826   NA 142 04/07/2014 1320   K 3.9 06/30/2014 0826   K 4.2 04/07/2014 1320   CL 103 04/07/2014 1320   CO2 24 06/30/2014 0826   CO2 25 04/07/2014 1320   BUN 11.5 06/30/2014 0826   BUN 16 04/07/2014 1320   CREATININE 0.7 06/30/2014 0826   CREATININE 0.56 04/07/2014 1320      Component Value Date/Time   CALCIUM 9.0 06/30/2014 0826   CALCIUM 9.4 04/07/2014 1320   ALKPHOS 121 06/30/2014 0826   ALKPHOS 68 04/07/2014 1320   AST 77* 06/30/2014 0826   AST 20 04/07/2014 1320   ALT 148* 06/30/2014 0826   ALT 19 04/07/2014 1320   BILITOT <0.20 06/30/2014 0826   BILITOT 0.2* 04/07/2014 1320       STUDIES:  LV EF: 56% on echo done on 04/05/2014.   ASSESSMENT: 63 y.o. [place],woman status post right lumpectomy and sentinel lymph node sampling 04/11/2014 for a pT2 pN1, stage IIB invasive ductal carcinoma, grade 3, estrogen receptor strongly positive, progesterone receptor negative, with an MIB-1 of 94% and HER-2 amplification  (1) additional surgery in 06/06/2014 for margin clearance showed no evidence of residual malignancy  (2) received the first of six planned cycles of adjuvant chemotherapy with  carboplatin docetaxel and trastuzumab 05/02/2014, with chemotherapy interrupted because of her intervening surgery; chemo to be resumed 06/23/2014 CYCLE 2 DAY 8 TODAY.  (3) continuing trastuzumab through May 2016 to complete 1 year, most recent echocardiogram 04/05/2014 showed a normal ejection fraction  (4) the patient will need adjuvant radiation at the completion of chemotherapy  (5) she will start antiestrogen therapy at the completion of radiation  (6) The big issue today was the need to increase her Metformin to 1000 mg po bid and keep a close check on her blood sugars. She is also seeing the cardiologist on 07/03/2014.  PLAN:   1. Continue adjuvant chemotherapy TCH with curative potential C2D8 today. Herceptin is weekly and TC is q 3 weeks. 2. Her labs were reviewed and are normal and OK to proceed with chemotherapy. 3. Her physical exam was fine. 4. Last echo in April showed a preserved EF 56%. Another echo and cards f/u coming up on 07/03/2014. 5. Patient's blood sugar is elevated"220" and she has diabetes so better glycemic control is needed during chemotherapy. 6. RTC next week with labs.    Bernadene Bell, MD Medical Hematologist/Oncologist [place] Pager: [phone] Office No: [phone]   06/30/2014 9:33 AM

## 2014-06-30 NOTE — Progress Notes (Signed)
Pt saw Dr. Lona Kettle today prior to chemo.  OK to treat with all lab results today as per md.

## 2014-07-03 ENCOUNTER — Ambulatory Visit (HOSPITAL_COMMUNITY)
Admission: RE | Admit: 2014-07-03 | Discharge: 2014-07-03 | Disposition: A | Discharge status: Home / Self Care | Payer: BC Managed Care – PPO | Source: Ambulatory Visit | Attending: Oncology | Admitting: Oncology

## 2014-07-03 DIAGNOSIS — E119 Type 2 diabetes mellitus without complications: Secondary | ICD-10-CM | POA: Insufficient documentation

## 2014-07-03 DIAGNOSIS — C50919 Malignant neoplasm of unspecified site of unspecified female breast: Secondary | ICD-10-CM | POA: Insufficient documentation

## 2014-07-03 DIAGNOSIS — I517 Cardiomegaly: Secondary | ICD-10-CM

## 2014-07-03 DIAGNOSIS — Z09 Encounter for follow-up examination after completed treatment for conditions other than malignant neoplasm: Secondary | ICD-10-CM | POA: Insufficient documentation

## 2014-07-03 DIAGNOSIS — C50411 Malignant neoplasm of upper-outer quadrant of right female breast: Secondary | ICD-10-CM

## 2014-07-03 NOTE — Progress Notes (Signed)
  Echocardiogram 2D Echocardiogram limited has been performed.  Roscoe Witts 07/03/2014, 9:46 AM

## 2014-07-05 ENCOUNTER — Telehealth (HOSPITAL_COMMUNITY): Payer: Self-pay | Admitting: Vascular Surgery

## 2014-07-05 NOTE — Telephone Encounter (Signed)
Left message ... Canceled pt appt... She needs to see doctor  Not NP

## 2014-07-06 ENCOUNTER — Other Ambulatory Visit: Payer: Self-pay

## 2014-07-06 DIAGNOSIS — C50411 Malignant neoplasm of upper-outer quadrant of right female breast: Secondary | ICD-10-CM

## 2014-07-07 ENCOUNTER — Ambulatory Visit (HOSPITAL_BASED_OUTPATIENT_CLINIC_OR_DEPARTMENT_OTHER): Payer: BC Managed Care – PPO

## 2014-07-07 ENCOUNTER — Ambulatory Visit (HOSPITAL_BASED_OUTPATIENT_CLINIC_OR_DEPARTMENT_OTHER): Payer: BC Managed Care – PPO | Admitting: Adult Health

## 2014-07-07 ENCOUNTER — Other Ambulatory Visit: Payer: BC Managed Care – PPO

## 2014-07-07 ENCOUNTER — Other Ambulatory Visit (HOSPITAL_BASED_OUTPATIENT_CLINIC_OR_DEPARTMENT_OTHER): Payer: BC Managed Care – PPO

## 2014-07-07 ENCOUNTER — Encounter: Payer: Self-pay | Admitting: Adult Health

## 2014-07-07 VITALS — BP 119/72 | HR 80 | Temp 98.4°F | Resp 18 | Ht 67.0 in | Wt 209.5 lb

## 2014-07-07 DIAGNOSIS — Z5112 Encounter for antineoplastic immunotherapy: Secondary | ICD-10-CM

## 2014-07-07 DIAGNOSIS — C50419 Malignant neoplasm of upper-outer quadrant of unspecified female breast: Secondary | ICD-10-CM

## 2014-07-07 DIAGNOSIS — R7309 Other abnormal glucose: Secondary | ICD-10-CM

## 2014-07-07 DIAGNOSIS — R197 Diarrhea, unspecified: Secondary | ICD-10-CM

## 2014-07-07 DIAGNOSIS — C50411 Malignant neoplasm of upper-outer quadrant of right female breast: Secondary | ICD-10-CM

## 2014-07-07 LAB — COMPREHENSIVE METABOLIC PANEL (CC13)
ALT: 28 U/L (ref 0–55)
AST: 15 U/L (ref 5–34)
Albumin: 3.5 g/dL (ref 3.5–5.0)
Alkaline Phosphatase: 99 U/L (ref 40–150)
Anion Gap: 11 mEq/L (ref 3–11)
BUN: 9.6 mg/dL (ref 7.0–26.0)
CO2: 21 mEq/L — ABNORMAL LOW (ref 22–29)
Calcium: 8.9 mg/dL (ref 8.4–10.4)
Chloride: 110 mEq/L — ABNORMAL HIGH (ref 98–109)
Creatinine: 0.7 mg/dL (ref 0.6–1.1)
GLUCOSE: 196 mg/dL — AB (ref 70–140)
Potassium: 3.7 mEq/L (ref 3.5–5.1)
Sodium: 142 mEq/L (ref 136–145)
TOTAL PROTEIN: 6.8 g/dL (ref 6.4–8.3)
Total Bilirubin: 0.25 mg/dL (ref 0.20–1.20)

## 2014-07-07 LAB — CBC WITH DIFFERENTIAL/PLATELET
BASO%: 0.5 % (ref 0.0–2.0)
Basophils Absolute: 0 10*3/uL (ref 0.0–0.1)
EOS ABS: 0 10*3/uL (ref 0.0–0.5)
EOS%: 0.1 % (ref 0.0–7.0)
HCT: 37.5 % (ref 34.8–46.6)
HGB: 12 g/dL (ref 11.6–15.9)
LYMPH%: 19.3 % (ref 14.0–49.7)
MCH: 26.3 pg (ref 25.1–34.0)
MCHC: 32.1 g/dL (ref 31.5–36.0)
MCV: 81.8 fL (ref 79.5–101.0)
MONO#: 0.3 10*3/uL (ref 0.1–0.9)
MONO%: 4 % (ref 0.0–14.0)
NEUT#: 6.6 10*3/uL — ABNORMAL HIGH (ref 1.5–6.5)
NEUT%: 76.1 % (ref 38.4–76.8)
PLATELETS: 152 10*3/uL (ref 145–400)
RBC: 4.58 10*6/uL (ref 3.70–5.45)
RDW: 15 % — ABNORMAL HIGH (ref 11.2–14.5)
WBC: 8.6 10*3/uL (ref 3.9–10.3)
lymph#: 1.7 10*3/uL (ref 0.9–3.3)

## 2014-07-07 MED ORDER — CHOLESTYRAMINE 4 G PO PACK: 4.0000 g | PACK | Freq: Two times a day (BID) | ORAL | Status: DC

## 2014-07-07 MED ORDER — DIPHENHYDRAMINE HCL 25 MG PO CAPS
ORAL_CAPSULE | ORAL | Status: AC
  Filled 2014-07-07: qty 2

## 2014-07-07 MED ORDER — ACETAMINOPHEN 325 MG PO TABS
ORAL_TABLET | ORAL | Status: AC
  Filled 2014-07-07: qty 2

## 2014-07-07 MED ORDER — ACETAMINOPHEN 325 MG PO TABS
650.0000 mg | ORAL_TABLET | Freq: Once | ORAL | Status: AC
  Administered 2014-07-07: 650 mg via ORAL

## 2014-07-07 MED ORDER — HEPARIN SOD (PORK) LOCK FLUSH 100 UNIT/ML IV SOLN
500.0000 [IU] | Freq: Once | INTRAVENOUS | Status: AC | PRN
  Administered 2014-07-07: 500 [IU]
  Filled 2014-07-07: qty 5

## 2014-07-07 MED ORDER — DIPHENHYDRAMINE HCL 25 MG PO CAPS
50.0000 mg | ORAL_CAPSULE | Freq: Once | ORAL | Status: AC
  Administered 2014-07-07: 50 mg via ORAL

## 2014-07-07 MED ORDER — SODIUM CHLORIDE 0.9 % IV SOLN
Freq: Once | INTRAVENOUS | Status: AC
  Administered 2014-07-07: 10:00:00 via INTRAVENOUS

## 2014-07-07 MED ORDER — SODIUM CHLORIDE 0.9 % IJ SOLN
10.0000 mL | INTRAMUSCULAR | Status: DC | PRN
  Administered 2014-07-07: 10 mL
  Filled 2014-07-07: qty 10

## 2014-07-07 MED ORDER — TRASTUZUMAB CHEMO INJECTION 440 MG
2.0000 mg/kg | Freq: Once | INTRAVENOUS | Status: AC
  Administered 2014-07-07: 189 mg via INTRAVENOUS
  Filled 2014-07-07: qty 9

## 2014-07-07 NOTE — Progress Notes (Signed)
ID: [name] OB: 08-19-1951  MR#: [id number]  FVO#:[id number]  PCP: Ricke Hey, MD GYN:   SU: Dr. Brantley Stage OTHER MD:  CHIEF COMPLAINT:  Right-sided breast cancer  CURRENT THERAPY:receiving adjuvant chemotherapy [place] C2,D8 today.  BREAST CANCER HISTORY:  ANALYSA NUTTING is a 63 y.o. female. Patient palpated a right breast mass. She had a mammogram performed and she was noted to have a mass in the upper outer quadrant measuring 2.1 x 2.0 x 1.7 cm. 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 Tumor was ER positive PR positive HER-2/neu positive (triple positive) with a proliferation marker Ki-67 at 90%. She had MRI of the breasts performed which showed a solitary mass in the upper outer quadrant measuring 2.5 cm.   INTERVAL HISTORY:   Mrs. Mccard presents accompanied her husband for a followup visit.  She is here prior to receiving day 15 cycle 2 of 6 planned q. three-week doses of docetaxel/carboplatin/trastuzumab, being given in the adjuvant setting. Patient receives docetaxel/carboplatin on day 1 of each 21 day cycle, Neulasta on day 2, and trastuzumab alone on days 8 and 15.  She is due for traztuzumab today.   She had a blood glucose of 499 last week and her Metformin was increased to 1015m PO BID.  Since then she has had some diarrhea that has been increased to about 3-4 times per day.  She is taking Imodium, but is taking the liquid form and has no idea how many mg she is taking per day.  She denies fevers, chills, nausea, vomiting, constipation, numbness/tingling, nail changes, skin changes, or any further concerns.   REVIEW OF SYSTEMS:  A 10 point review of systems was conducted and is otherwise negative except for what is noted above.     PAST MEDICAL HISTORY: Past Medical History  Diagnosis Date  . Diabetes mellitus without complication   . Arthritis   . Hot flashes   . Cancer   . Breast cancer   . Wears glasses   . Wears partial  dentures     bottom    PAST SURGICAL HISTORY: Past Surgical History  Procedure Laterality Date  . Bladder repair w/ cesarean section    . Colonoscopy    . Portacath placement N/A 04/11/2014    Procedure: INSERTION PORT-A-CATH;  Surgeon: TJoyice Faster Cornett, MD;  [facility] [facility]  Service: General;  Laterality: N/A;  . Re-excision of breast lumpectomy Right 06/06/2014    Procedure: RE-EXCISION OF BREAST LUMPECTOMY;  Surgeon: TMarcello MooresA. Cornett, MD;  Location: [facility]  Service: General;  Laterality: Right;    FAMILY HISTORY No family history on file.  GYNECOLOGIC HISTORY: menarche at age 89 GG19 P3 went through menopause at age 63  No h/o of HRT, no h/o abnormal pap smears, or sexually transmitted infections.  Does have frequent vaginal candida infections due to diabetes that she follows with her gynecologist for.    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 The patient has 3 sons, one of whom is studying to be a pediatrician. All her children are married. She has 6 grandchildren.    ADVANCED DIRECTIVES: not in place.    HEALTH MAINTENANCE: History  Substance Use Topics  . Smoking status: Former Smoker -- 1.00 packs/day    Quit date: 03/08/1986  .  Smokeless tobacco: Never Used  . Alcohol Use: No     Allergies  Allergen Reactions  . Sulfa Antibiotics Rash      OBJECTIVE: Middle-aged African American woman in no acute distress Filed Vitals:   07/07/14 0909  BP: 119/72  Pulse: 80  Temp: 98.4 F (36.9 C)  Resp: 18     Body mass index is 32.8 kg/(m^2).     GENERAL: Patient is a well appearing female in no acute distress HEENT:  Sclerae anicteric.  Oropharynx clear and moist. No ulcerations or evidence of oropharyngeal candidiasis. Neck is supple.  NODES:  No cervical,  supraclavicular, or axillary lymphadenopathy palpated.  BREAST EXAM:  Deferred. LUNGS:  Clear to auscultation bilaterally.  No wheezes or rhonchi. HEART:  Regular rate and rhythm. No murmur appreciated. ABDOMEN:  Soft, nontender.  Positive, normoactive bowel sounds. No organomegaly palpated. MSK:  No focal spinal tenderness to palpation. Full range of motion bilaterally in the upper extremities. EXTREMITIES:  No peripheral edema.   SKIN:  Clear with no obvious rashes or skin changes. No nail dyscrasia. NEURO:  Nonfocal. Well oriented.  Appropriate affect. ECOG FS:1 - Symptomatic but completely ambulatory    LAB RESULTS:  CMP     Component Value Date/Time   NA 142 07/07/2014 0854   NA 142 04/07/2014 1320   K 3.7 07/07/2014 0854   K 4.2 04/07/2014 1320   CL 103 04/07/2014 1320   CO2 21* 07/07/2014 0854   CO2 25 04/07/2014 1320   GLUCOSE 196* 07/07/2014 0854   GLUCOSE 126* 04/07/2014 1320   BUN 9.6 07/07/2014 0854   BUN 16 04/07/2014 1320   CREATININE 0.7 07/07/2014 0854   CREATININE 0.56 04/07/2014 1320   CALCIUM 8.9 07/07/2014 0854   CALCIUM 9.4 04/07/2014 1320   PROT 6.8 07/07/2014 0854   PROT 7.8 04/07/2014 1320   ALBUMIN 3.5 07/07/2014 0854   ALBUMIN 3.9 04/07/2014 1320   AST 15 07/07/2014 0854   AST 20 04/07/2014 1320   ALT 28 07/07/2014 0854   ALT 19 04/07/2014 1320   ALKPHOS 99 07/07/2014 0854   ALKPHOS 68 04/07/2014 1320   BILITOT 0.25 07/07/2014 0854   BILITOT 0.2* 04/07/2014 1320   GFRNONAA >90 04/07/2014 1320   GFRAA >90 04/07/2014 1320    I   Lab Results  Component Value Date   WBC 8.6 07/07/2014   NEUTROABS 6.6* 07/07/2014   HGB 12.0 07/07/2014   HCT 37.5 07/07/2014   MCV 81.8 07/07/2014   PLT 152 07/07/2014      Chemistry      Component Value Date/Time   NA 142 07/07/2014 0854   NA 142 04/07/2014 1320   K 3.7 07/07/2014 0854   K 4.2 04/07/2014 1320   CL 103 04/07/2014 1320   CO2 21* 07/07/2014 0854   CO2 25 04/07/2014 1320   BUN 9.6 07/07/2014 0854   BUN 16 04/07/2014 1320   CREATININE 0.7  07/07/2014 0854   CREATININE 0.56 04/07/2014 1320      Component Value Date/Time   CALCIUM 8.9 07/07/2014 0854   CALCIUM 9.4 04/07/2014 1320   ALKPHOS 99 07/07/2014 0854   ALKPHOS 68 04/07/2014 1320   AST 15 07/07/2014 0854   AST 20 04/07/2014 1320   ALT 28 07/07/2014 0854   ALT 19 04/07/2014 1320   BILITOT 0.25 07/07/2014 0854   BILITOT 0.2* 04/07/2014 1320       STUDIES:  LV EF: 60-65% on echo done on 07/03/2014.   ASSESSMENT: 63  y.o. [place]   (1) Patient is status post right lumpectomy and sentinel lymph node sampling 04/11/2014 for a pT2 pN1, stage IIB invasive ductal carcinoma, grade 3, estrogen receptor strongly positive, progesterone receptor negative, with an MIB-1 of 94% and HER-2 amplification  (2) additional surgery in 06/06/2014 for margin clearance showed no evidence of residual malignancy  (3) received the first of six planned cycles of adjuvant chemotherapy with carboplatin docetaxel and trastuzumab 05/02/2014.  (4) continuing trastuzumab through May 2016 to complete 1 year, most recent echocardiogram on 07/03/2014 shows a well preserved EF of 60-65%.  (5) the patient will need adjuvant radiation at the completion of chemotherapy  (6) she will start antiestrogen therapy at the completion of radiation  (6) Hyperglycemia:  Taking Metformin 1059m PO BID  PLAN:   BTamais doing very well today.  She is tolerating treatment without difficulty.  She did just have an echocardiogram on 07/03/14 that demonstrated a well preserved ejection fraction.  Her labs are stable and I reviewed them with her in detail.  She will proceed with  Her diarrhea is likely due to the increased dose of Metformin.  Her blood glucose is much improved today considering it was 499 at the last appointment.  She would like something other than Imodium to take for it.  I prescribed Questran PO BID for her diarrhea.  I gave her detailed instructions in her AVS on how to take this.  She verbalized  understanding.    BGracianawill return in one week for labs, evaluation, and Docetaxel, Carboplatin, and Trastuzumab chemotherapy.   She knows to call [place] the interim for any questions or concerns.  We can certainly see her sooner if needed.  I spent 25 minutes counseling the patient face to face.  The total time spent in the appointment was 30 minutes.  LMinette Headland NTaylor [phone]/31/2015 7:33 PM

## 2014-07-07 NOTE — Patient Instructions (Signed)
[facility] Discharge Instructions for Patients Receiving Chemotherapy  Today you received the following chemotherapy agents: Herceptin  To help prevent nausea and vomiting after your treatment, we encourage you to take your nausea medication as prescribed by your physician.    If you develop nausea and vomiting that is not controlled by your nausea medication, call the clinic.   BELOW ARE SYMPTOMS THAT SHOULD BE REPORTED IMMEDIATELY:  *FEVER GREATER THAN 100.5 F  *CHILLS WITH OR WITHOUT FEVER  NAUSEA AND VOMITING THAT IS NOT CONTROLLED WITH YOUR NAUSEA MEDICATION  *UNUSUAL SHORTNESS OF BREATH  *UNUSUAL BRUISING OR BLEEDING  TENDERNESS IN MOUTH AND THROAT WITH OR WITHOUT PRESENCE OF ULCERS  *URINARY PROBLEMS  *BOWEL PROBLEMS  UNUSUAL RASH Items with * indicate a potential emergency and should be followed up as soon as possible.  Feel free to call the clinic you have any questions or concerns. The clinic phone number is [phone].    

## 2014-07-07 NOTE — Patient Instructions (Signed)
Cholestyramine powder for oral suspension What is this medicine? CHOLESTYRAMINE (koe LESS tir a meen) is used to lower cholesterol in patients who are at risk of heart disease or stroke. This medicine is only for patients whose cholesterol level is not controlled by diet. This medicine may be used for other purposes; ask your health care provider or pharmacist if you have questions. COMMON BRAND NAME(S): Locholest, Locholest Light, Prevalite, Questran, Questran Light What should I tell my health care provider before I take this medicine? They need to know if you have any of these conditions: -blocked bile duct -an unusual or allergic reaction to cholestyramine, other medicines, foods, dyes, or preservatives -pregnant or trying to get pregnant -breast-feeding How should I use this medicine? Do not take this medicine in the dry form. It must be mixed with a liquid before swallowing. Follow the directions on the prescription label. Place the powder in a glass or cup. Add between 2 and 6 ounces of fluid. This can be water, milk, pulpy fruit juice, fluid soup, or other liquid. Mix well and drink all of the liquid. Take your doses at regular intervals. Do not take your medicine more often than directed. Talk to your pediatrician regarding the use of this medicine in children. Special care may be needed. Overdosage: If you think you have taken too much of this medicine contact a poison control center or emergency room at once. NOTE: This medicine is only for you. Do not share this medicine with others. What if I miss a dose? If you miss a dose, take it as soon as you can. If it is almost time for your next dose, take only that dose. Do not take double or extra doses. What may interact with this medicine? -diuretics -female hormones, like estrogens or progestins and birth control pills -heart medicines such as digoxin or digitoxin -penicillin  G -phenobarbital -phenylbutazone -phytonadione -propranolol -tetracycline antibiotics -thyroid hormones -vitamin A -vitamin D -vitamin E -warfarin Take other drugs at least 1 hour before or 4 hours after this medicine, to avoid decreasing their absorption. This list may not describe all possible interactions. Give your health care provider a list of all the medicines, herbs, non-prescription drugs, or dietary supplements you use. Also tell them if you smoke, drink alcohol, or use illegal drugs. Some items may interact with your medicine. What should I watch for while using this medicine? Visit your doctor or health care professional for regular checks on your progress. Your blood fats and other tests will be measured from time to time. This medicine is only part of a total cholesterol-lowering program. Your health care professional or dietician can suggest a low-cholesterol and low-fat diet that will reduce your risk of getting heart and blood vessel disease. Avoid alcohol and smoking, and keep a proper exercise schedule. To reduce the chance of getting constipated, drink plenty of water and increase the amount of fiber in your diet. Ask your doctor or health care professional for advice if you are constipated. What side effects may I notice from receiving this medicine? Side effects that you should report to your doctor or health care professional as soon as possible: -allergic reactions like skin rash, itching or hives, swelling of the face, lips, or tongue -bloody or black, tarry stools -severe stomach pain with nausea and vomiting -unusual bleeding Side effects that usually do not require medical attention (report to your doctor or health care professional if they continue or are bothersome): -constipation or diarrhea -dizziness -headache -heartburn,   indigestion -nausea, vomiting -perianal irritation This list may not describe all possible side effects. Call your doctor for medical  advice about side effects. You may report side effects to FDA at 1-800-FDA-1088. Where should I keep my medicine? Keep out of the reach of children. Store at room temperature between 15 and 30 degrees C (59 and 86 degrees F). Throw away any unused medicine after the expiration date. NOTE: This sheet is a summary. It may not cover all possible information. If you have questions about this medicine, talk to your doctor, pharmacist, or health care provider.  2015, Elsevier/Gold Standard. (2008-02-29 15:33:42)  

## 2014-07-11 ENCOUNTER — Encounter (HOSPITAL_COMMUNITY): Payer: BC Managed Care – PPO

## 2014-07-13 ENCOUNTER — Other Ambulatory Visit: Payer: Self-pay | Admitting: Emergency Medicine

## 2014-07-13 DIAGNOSIS — C50411 Malignant neoplasm of upper-outer quadrant of right female breast: Secondary | ICD-10-CM

## 2014-07-14 ENCOUNTER — Ambulatory Visit (HOSPITAL_BASED_OUTPATIENT_CLINIC_OR_DEPARTMENT_OTHER): Payer: BC Managed Care – PPO

## 2014-07-14 ENCOUNTER — Other Ambulatory Visit (HOSPITAL_BASED_OUTPATIENT_CLINIC_OR_DEPARTMENT_OTHER): Payer: BC Managed Care – PPO

## 2014-07-14 ENCOUNTER — Ambulatory Visit: Payer: BC Managed Care – PPO

## 2014-07-14 ENCOUNTER — Ambulatory Visit (HOSPITAL_BASED_OUTPATIENT_CLINIC_OR_DEPARTMENT_OTHER): Payer: BC Managed Care – PPO | Admitting: Oncology

## 2014-07-14 VITALS — BP 143/60 | HR 86 | Temp 98.5°F | Resp 18 | Ht 67.0 in | Wt 208.0 lb

## 2014-07-14 DIAGNOSIS — C50419 Malignant neoplasm of upper-outer quadrant of unspecified female breast: Secondary | ICD-10-CM

## 2014-07-14 DIAGNOSIS — C50411 Malignant neoplasm of upper-outer quadrant of right female breast: Secondary | ICD-10-CM

## 2014-07-14 DIAGNOSIS — Z5112 Encounter for antineoplastic immunotherapy: Secondary | ICD-10-CM

## 2014-07-14 DIAGNOSIS — Z170 Estrogen receptor positive status [ER+]: Secondary | ICD-10-CM

## 2014-07-14 DIAGNOSIS — Z5111 Encounter for antineoplastic chemotherapy: Secondary | ICD-10-CM

## 2014-07-14 LAB — COMPREHENSIVE METABOLIC PANEL (CC13)
ALT: 16 U/L (ref 0–55)
ANION GAP: 11 meq/L (ref 3–11)
AST: 15 U/L (ref 5–34)
Albumin: 3.7 g/dL (ref 3.5–5.0)
Alkaline Phosphatase: 78 U/L (ref 40–150)
BILIRUBIN TOTAL: 0.4 mg/dL (ref 0.20–1.20)
BUN: 11.2 mg/dL (ref 7.0–26.0)
CO2: 22 mEq/L (ref 22–29)
Calcium: 9.4 mg/dL (ref 8.4–10.4)
Chloride: 109 mEq/L (ref 98–109)
Creatinine: 0.9 mg/dL (ref 0.6–1.1)
Glucose: 238 mg/dl — ABNORMAL HIGH (ref 70–140)
POTASSIUM: 3.6 meq/L (ref 3.5–5.1)
Sodium: 142 mEq/L (ref 136–145)
TOTAL PROTEIN: 7.1 g/dL (ref 6.4–8.3)

## 2014-07-14 LAB — CBC WITH DIFFERENTIAL/PLATELET
BASO%: 0.9 % (ref 0.0–2.0)
Basophils Absolute: 0.1 10*3/uL (ref 0.0–0.1)
EOS%: 0 % (ref 0.0–7.0)
Eosinophils Absolute: 0 10*3/uL (ref 0.0–0.5)
HCT: 37 % (ref 34.8–46.6)
HGB: 11.8 g/dL (ref 11.6–15.9)
LYMPH#: 1.8 10*3/uL (ref 0.9–3.3)
LYMPH%: 18.4 % (ref 14.0–49.7)
MCH: 26.1 pg (ref 25.1–34.0)
MCHC: 31.7 g/dL (ref 31.5–36.0)
MCV: 82.2 fL (ref 79.5–101.0)
MONO#: 0.6 10*3/uL (ref 0.1–0.9)
MONO%: 5.9 % (ref 0.0–14.0)
NEUT#: 7.5 10*3/uL — ABNORMAL HIGH (ref 1.5–6.5)
NEUT%: 74.8 % (ref 38.4–76.8)
Platelets: 276 10*3/uL (ref 145–400)
RBC: 4.5 10*6/uL (ref 3.70–5.45)
RDW: 15.5 % — AB (ref 11.2–14.5)
WBC: 10 10*3/uL (ref 3.9–10.3)

## 2014-07-14 MED ORDER — ONDANSETRON 16 MG/50ML IVPB (CHCC)
INTRAVENOUS | Status: AC
  Filled 2014-07-14: qty 16

## 2014-07-14 MED ORDER — HEPARIN SOD (PORK) LOCK FLUSH 100 UNIT/ML IV SOLN
500.0000 [IU] | Freq: Once | INTRAVENOUS | Status: AC | PRN
  Administered 2014-07-14: 500 [IU]
  Filled 2014-07-14: qty 5

## 2014-07-14 MED ORDER — ACETAMINOPHEN 325 MG PO TABS
650.0000 mg | ORAL_TABLET | Freq: Once | ORAL | Status: AC
  Administered 2014-07-14: 650 mg via ORAL

## 2014-07-14 MED ORDER — SODIUM CHLORIDE 0.9 % IJ SOLN
10.0000 mL | INTRAMUSCULAR | Status: DC | PRN
  Filled 2014-07-14: qty 10

## 2014-07-14 MED ORDER — SODIUM CHLORIDE 0.9 % IV SOLN: Freq: Once | INTRAVENOUS | Status: AC

## 2014-07-14 MED ORDER — DOCETAXEL CHEMO INJECTION 160 MG/16ML
75.0000 mg/m2 | Freq: Once | INTRAVENOUS | Status: AC
  Administered 2014-07-14: 160 mg via INTRAVENOUS
  Filled 2014-07-14: qty 16

## 2014-07-14 MED ORDER — SODIUM CHLORIDE 0.9 % IV SOLN
Freq: Once | INTRAVENOUS | Status: AC
  Administered 2014-07-14: 13:00:00 via INTRAVENOUS

## 2014-07-14 MED ORDER — ONDANSETRON 16 MG/50ML IVPB (CHCC)
16.0000 mg | Freq: Once | INTRAVENOUS | Status: AC
  Administered 2014-07-14: 16 mg via INTRAVENOUS

## 2014-07-14 MED ORDER — DIPHENHYDRAMINE HCL 25 MG PO CAPS
50.0000 mg | ORAL_CAPSULE | Freq: Once | ORAL | Status: AC
  Administered 2014-07-14: 50 mg via ORAL

## 2014-07-14 MED ORDER — TRASTUZUMAB CHEMO INJECTION 440 MG
2.0000 mg/kg | Freq: Once | INTRAVENOUS | Status: AC
  Administered 2014-07-14: 189 mg via INTRAVENOUS
  Filled 2014-07-14: qty 9

## 2014-07-14 MED ORDER — DEXAMETHASONE SODIUM PHOSPHATE 20 MG/5ML IJ SOLN
INTRAMUSCULAR | Status: AC
  Filled 2014-07-14: qty 5

## 2014-07-14 MED ORDER — CARBOPLATIN CHEMO INJECTION 600 MG/60ML
710.0000 mg | Freq: Once | INTRAVENOUS | Status: AC
  Administered 2014-07-14: 710 mg via INTRAVENOUS
  Filled 2014-07-14: qty 71

## 2014-07-14 MED ORDER — HEPARIN SOD (PORK) LOCK FLUSH 100 UNIT/ML IV SOLN
500.0000 [IU] | Freq: Once | INTRAVENOUS | Status: DC | PRN
  Filled 2014-07-14: qty 5

## 2014-07-14 MED ORDER — DIPHENHYDRAMINE HCL 25 MG PO CAPS
ORAL_CAPSULE | ORAL | Status: AC
  Filled 2014-07-14: qty 2

## 2014-07-14 MED ORDER — SODIUM CHLORIDE 0.9 % IJ SOLN
10.0000 mL | INTRAMUSCULAR | Status: DC | PRN
  Administered 2014-07-14: 10 mL
  Filled 2014-07-14: qty 10

## 2014-07-14 MED ORDER — ACETAMINOPHEN 325 MG PO TABS
ORAL_TABLET | ORAL | Status: AC
  Filled 2014-07-14: qty 2

## 2014-07-14 MED ORDER — DEXAMETHASONE SODIUM PHOSPHATE 20 MG/5ML IJ SOLN
20.0000 mg | Freq: Once | INTRAMUSCULAR | Status: AC
  Administered 2014-07-14: 20 mg via INTRAVENOUS

## 2014-07-14 NOTE — Progress Notes (Signed)
ID: Brandy Jackson OB: 1951/07/25  MR#: [id number]  QZR#:[id number]  PCP: Ricke Hey, MD GYN:   SU: Dr. Brantley Stage OTHER MD:  CHIEF COMPLAINT:  Right-sided breast cancer  CURRENT THERAPY:receiving adjuvant chemotherapy  BREAST CANCER HISTORY: From the original consult note:  "Brandy Jackson is a 63 y.o. female. Patient palpated a right breast mass. She had a mammogram performed and she was noted to have a mass in the upper outer quadrant measuring 2.1 x 2.0 x 1.7 cm. 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 Tumor was ER positive PR positive HER-2/neu positive (triple positive) with a proliferation marker Ki-67 at 90%. She had MRI of the breasts performed which showed a solitary mass in the upper outer quadrant measuring 2.5 cm. Her case was discussed at the multidisciplinary breast conference."  Her subsequent treatment is as detailed below.   INTERVAL HISTORY:   Mrs. Brandy Jackson returns today accompanied her husband for a followup visit. This is day 1 cycle 3 of 6 planned cycles of carboplatin, docetaxel, and trastuzumab, given with Neulasta on day 2  REVIEW OF SYSTEMS:  Lexany is tolerating the chemotherapy well. She has essentially no nausea. She has good energy while she is on the dexamethasone and dense lumps for a couple of days after that. She does have some aches and pains secondary to the Neulasta. She is taking metformin 500 mg twice daily and she feels this is causing her diarrhea. She actually increases the dose and chemotherapy days because of the steroids. She has psoriasis and back and joint pain and would like to consider treatment for psoriatic arthritis. She has moderate hot flashes. Aside from this a detailed review of systems today was noncontributory  PAST MEDICAL HISTORY: Past Medical History  Diagnosis Date  . Diabetes mellitus without complication   . Arthritis   . Hot flashes   . Cancer   . Breast cancer   . Wears glasses    . Wears partial dentures     bottom    PAST SURGICAL HISTORY: Past Surgical History  Procedure Laterality Date  . Bladder repair w/ cesarean section    . Colonoscopy    . Portacath placement N/A 04/11/2014    Procedure: INSERTION PORT-A-CATH;  Surgeon: Joyice Faster. Cornett, MD;  Location: [facility];  Service: General;  Laterality: N/A;  . Re-excision of breast lumpectomy Right 06/06/2014    Procedure: RE-EXCISION OF BREAST LUMPECTOMY;  Surgeon: Marcello Moores A. Cornett, MD;  Location: [facility];  Service: General;  Laterality: Right;    FAMILY HISTORY No family history on file.  GYNECOLOGIC HISTORY: menarche at age 68, G56 P3, went through menopause at age 87.  No h/o of HRT, no h/o abnormal pap smears, or sexually transmitted infections.  Does have frequent vaginal candida infections due to diabetes that she follows with her gynecologist for.    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 Her husband Brandy Jackson is retired from [employer]. He is a cancer survivor himself. The patient has 3 sons, one of whom is studying to be a pediatrician. All her children are married. She has 6 grandchildren.    ADVANCED DIRECTIVES: not in place.    HEALTH MAINTENANCE: History  Substance Use Topics  . Smoking status: Former Smoker -- 1.00 packs/day    Quit date: 03/08/1986  . Smokeless tobacco: Never Used  . Alcohol Use: No  Mammogram: Colonoscopy: Bone Density Scan:  Pap Smear:  Eye Exam:  Vitamin D Level:   Lipid Panel:    Allergies  Allergen Reactions  . Sulfa Antibiotics Rash    Current Outpatient Prescriptions  Medication Sig Dispense Refill  . cholestyramine (QUESTRAN) 4 G packet Take 1 packet (4 g total) by mouth 2 (two) times daily.  60 each  12  . dexamethasone (DECADRON) 4 MG tablet Take 2 tablets (8 mg total) by mouth 2 (two) times daily. Take the day before chemo, then  the day after chemo x 3 days.  30 tablet  2  . lidocaine-prilocaine (EMLA) cream Apply topically as needed.  30 g  6  . LORazepam (ATIVAN) 0.5 MG tablet Take 1 tablet (0.5 mg total) by mouth every 8 (eight) hours.  30 tablet  0  . metFORMIN (GLUCOPHAGE) 500 MG tablet Take 500 mg by mouth 2 (two) times daily with a meal.      . ondansetron (ZOFRAN) 8 MG tablet Take 1 tablet (8 mg total) by mouth 2 (two) times daily. Take starting the day after chemo for three days, then take twice a day PRN.  20 tablet  1  . oxyCODONE-acetaminophen (ROXICET) 5-325 MG per tablet Take 1 tablet by mouth every 4 (four) hours as needed.  30 tablet  0  . prochlorperazine (COMPAZINE) 10 MG tablet Take 1 tablet (10 mg total) by mouth every 6 (six) hours as needed for nausea or vomiting.  30 tablet  0   No current facility-administered medications for this visit.    OBJECTIVE: Middle-aged [place] American woman who appears stated age 4 Vitals:   07/14/14 1216  BP: 143/60  Pulse: 86  Temp: 98.5 F (36.9 C)  Resp: 18     Body mass index is 32.57 kg/(m^2).     Marland Kitchen ECOG FS:1 - Symptomatic but completely ambulatory  Sclerae unicteric, EOMs intact Oropharynx clear and moist No cervical or supraclavicular adenopathy Lungs no rales or rhonchi Heart regular rate and rhythm Abd soft, obese, nontender, positive bowel sounds MSK no focal spinal tenderness, no upper extremity lymphedema Neuro: nonfocal, well oriented, positive affect Breasts: Deferred  LAB RESULTS:  CMP     Component Value Date/Time   NA 142 07/07/2014 0854   NA 142 04/07/2014 1320   K 3.7 07/07/2014 0854   K 4.2 04/07/2014 1320   CL 103 04/07/2014 1320   CO2 21* 07/07/2014 0854   CO2 25 04/07/2014 1320   GLUCOSE 196* 07/07/2014 0854   GLUCOSE 126* 04/07/2014 1320   BUN 9.6 07/07/2014 0854   BUN 16 04/07/2014 1320   CREATININE 0.7 07/07/2014 0854   CREATININE 0.56 04/07/2014 1320   CALCIUM 8.9 07/07/2014 0854   CALCIUM 9.4 04/07/2014 1320   PROT 6.8  07/07/2014 0854   PROT 7.8 04/07/2014 1320   ALBUMIN 3.5 07/07/2014 0854   ALBUMIN 3.9 04/07/2014 1320   AST 15 07/07/2014 0854   AST 20 04/07/2014 1320   ALT 28 07/07/2014 0854   ALT 19 04/07/2014 1320   ALKPHOS 99 07/07/2014 0854   ALKPHOS 68 04/07/2014 1320   BILITOT 0.25 07/07/2014 0854   BILITOT 0.2* 04/07/2014 1320   GFRNONAA >90 04/07/2014 1320   GFRAA >90 04/07/2014 1320    I No results found for this basename: SPEP,  UPEP,   kappa and lambda light chains    Lab Results  Component Value Date   WBC 10.0 07/14/2014   NEUTROABS 7.5* 07/14/2014   HGB 11.8  07/14/2014   HCT 37.0 07/14/2014   MCV 82.2 07/14/2014   PLT 276 07/14/2014      Chemistry      Component Value Date/Time   NA 142 07/07/2014 0854   NA 142 04/07/2014 1320   K 3.7 07/07/2014 0854   K 4.2 04/07/2014 1320   CL 103 04/07/2014 1320   CO2 21* 07/07/2014 0854   CO2 25 04/07/2014 1320   BUN 9.6 07/07/2014 0854   BUN 16 04/07/2014 1320   CREATININE 0.7 07/07/2014 0854   CREATININE 0.56 04/07/2014 1320      Component Value Date/Time   CALCIUM 8.9 07/07/2014 0854   CALCIUM 9.4 04/07/2014 1320   ALKPHOS 99 07/07/2014 0854   ALKPHOS 68 04/07/2014 1320   AST 15 07/07/2014 0854   AST 20 04/07/2014 1320   ALT 28 07/07/2014 0854   ALT 19 04/07/2014 1320   BILITOT 0.25 07/07/2014 0854   BILITOT 0.2* 04/07/2014 1320       No results found for this basename: LABCA2    No components found with this basename: LABCA125    No results found for this basename: INR,  in the last 168 hours  Urinalysis No results found for this basename: colorurine,  appearanceur,  labspec,  phurine,  glucoseu,  hgbur,  bilirubinur,  ketonesur,  proteinur,  urobilinogen,  nitrite,  leukocytesur    STUDIES: No results found.  ASSESSMENT: 63 y.o. [place],woman status post right lumpectomy and sentinel lymph node sampling 04/11/2014 for a pT2 pN1, stage IIB invasive ductal carcinoma, grade 3, estrogen receptor strongly positive, progesterone receptor negative, with an MIB-1 of  94% and HER-2 amplification  (1) additional surgery in 06/06/2014 for margin clearance showed no evidence of residual malignancy  (2) received the first of six planned cycles of adjuvant chemotherapy with carboplatin docetaxel and trastuzumab 05/02/2014, with chemotherapy interrupted because of her intervening surgery; chemo to be resumed 06/26/2014  (3) continuing trastuzumab through May 2016 to complete 1 year, most recent echocardiogram 04/05/2014 showed a normal ejection fraction  (4) the patient will need adjuvant radiation at the completion of chemotherapy  (5) she will start antiestrogen therapy at the completion of radiation  PLAN:  Misaki will proceed to her third cycle of 6 planned of chemotherapy today. She takes extra metformin because of the expected rise in her blood glucose from steroids. This of course worsens her diarrhea. Today we discussed the appropriate use of Imodium and Questran. Specifically she will use the questran twice daily, Abby Imodium one tablet after each bowel movement, maximum 6 a day, so she has the diarrhea problems.   We are cutting back on her days 2 through 4 dexamethasone, namely to 4 mg twice a day instead of 8 mg. She will continue the Zofran as before.  She requested a rheumatology referral and we have put that in place. Her son, who is studying pediatrics at  [employer], came down with viral meningitis. I reassured them that this is usually self-limited and that patients usually recover completely.  Sharan has a good understanding of the overall plan. She agrees with it. She knows the goal of treatment in her case is cure. She will call with any problems that may develop before her next visit here.    07/14/2014 12:38 PM

## 2014-07-14 NOTE — Patient Instructions (Addendum)
[facility] Discharge Instructions for Patients Receiving Chemotherapy  Today you received the following chemotherapy agents: Taxotere, Carboplatin, and Herceptin.  To help prevent nausea and vomiting after your treatment, we encourage you to take your nausea medication as prescribed.   If you develop nausea and vomiting that is not controlled by your nausea medication, call the clinic.   BELOW ARE SYMPTOMS THAT SHOULD BE REPORTED IMMEDIATELY:  *FEVER GREATER THAN 100.5 F  *CHILLS WITH OR WITHOUT FEVER  NAUSEA AND VOMITING THAT IS NOT CONTROLLED WITH YOUR NAUSEA MEDICATION  *UNUSUAL SHORTNESS OF BREATH  *UNUSUAL BRUISING OR BLEEDING  TENDERNESS IN MOUTH AND THROAT WITH OR WITHOUT PRESENCE OF ULCERS  *URINARY PROBLEMS  *BOWEL PROBLEMS  UNUSUAL RASH Items with * indicate a potential emergency and should be followed up as soon as possible.  Feel free to call the clinic you have any questions or concerns. The clinic phone number is [phone].

## 2014-07-15 ENCOUNTER — Ambulatory Visit (HOSPITAL_BASED_OUTPATIENT_CLINIC_OR_DEPARTMENT_OTHER): Payer: BC Managed Care – PPO

## 2014-07-15 DIAGNOSIS — Z5189 Encounter for other specified aftercare: Secondary | ICD-10-CM

## 2014-07-15 DIAGNOSIS — C50419 Malignant neoplasm of upper-outer quadrant of unspecified female breast: Secondary | ICD-10-CM

## 2014-07-15 MED ORDER — PEGFILGRASTIM INJECTION 6 MG/0.6ML
6.0000 mg | Freq: Once | SUBCUTANEOUS | Status: AC
  Administered 2014-07-15: 6 mg via SUBCUTANEOUS

## 2014-07-15 NOTE — Patient Instructions (Signed)
Pegfilgrastim injection What is this medicine? PEGFILGRASTIM (peg fil GRA stim) is a long-acting granulocyte colony-stimulating factor that stimulates the growth of neutrophils, a type of white blood cell important in the body's fight against infection. It is used to reduce the incidence of fever and infection in patients with certain types of cancer who are receiving chemotherapy that affects the bone marrow. This medicine may be used for other purposes; ask your health care provider or pharmacist if you have questions. COMMON BRAND NAME(S): Neulasta What should I tell my health care provider before I take this medicine? They need to know if you have any of these conditions: -latex allergy -ongoing radiation therapy -sickle cell disease -skin reactions to acrylic adhesives (On-Body Injector only) -an unusual or allergic reaction to pegfilgrastim, filgrastim, other medicines, foods, dyes, or preservatives -pregnant or trying to get pregnant -breast-feeding How should I use this medicine? This medicine is for injection under the skin. If you get this medicine at home, you will be taught how to prepare and give the pre-filled syringe or how to use the On-body Injector. Refer to the patient Instructions for Use for detailed instructions. Use exactly as directed. Take your medicine at regular intervals. Do not take your medicine more often than directed. It is important that you put your used needles and syringes in a special sharps container. Do not put them in a trash can. If you do not have a sharps container, call your pharmacist or healthcare provider to get one. Talk to your pediatrician regarding the use of this medicine in children. Special care may be needed. Overdosage: If you think you have taken too much of this medicine contact a poison control center or emergency room at once. NOTE: This medicine is only for you. Do not share this medicine with others. What if I miss a dose? It is  important not to miss your dose. Call your doctor or health care professional if you miss your dose. If you miss a dose due to an On-body Injector failure or leakage, a new dose should be administered as soon as possible using a single prefilled syringe for manual use. What may interact with this medicine? Interactions have not been studied. Give your health care provider a list of all the medicines, herbs, non-prescription drugs, or dietary supplements you use. Also tell them if you smoke, drink alcohol, or use illegal drugs. Some items may interact with your medicine. This list may not describe all possible interactions. Give your health care provider a list of all the medicines, herbs, non-prescription drugs, or dietary supplements you use. Also tell them if you smoke, drink alcohol, or use illegal drugs. Some items may interact with your medicine. What should I watch for while using this medicine? You may need blood work done while you are taking this medicine. If you are going to need a MRI, CT scan, or other procedure, tell your doctor that you are using this medicine (On-Body Injector only). What side effects may I notice from receiving this medicine? Side effects that you should report to your doctor or health care professional as soon as possible: -allergic reactions like skin rash, itching or hives, swelling of the face, lips, or tongue -dizziness -fever -pain, redness, or irritation at site where injected -pinpoint red spots on the skin -shortness of breath or breathing problems -stomach or side pain, or pain at the shoulder -swelling -tiredness -trouble passing urine Side effects that usually do not require medical attention (report to your doctor   or health care professional if they continue or are bothersome): -bone pain -muscle pain This list may not describe all possible side effects. Call your doctor for medical advice about side effects. You may report side effects to FDA at  1-800-FDA-1088. Where should I keep my medicine? Keep out of the reach of children. Store pre-filled syringes in a refrigerator between 2 and 8 degrees C (36 and 46 degrees F). Do not freeze. Keep in carton to protect from light. Throw away this medicine if it is left out of the refrigerator for more than 48 hours. Throw away any unused medicine after the expiration date. NOTE: This sheet is a summary. It may not cover all possible information. If you have questions about this medicine, talk to your doctor, pharmacist, or health care provider.  2015, Elsevier/Gold Standard. (2014-02-23 16:14:05)  

## 2014-07-20 ENCOUNTER — Other Ambulatory Visit: Payer: Self-pay | Admitting: *Deleted

## 2014-07-20 DIAGNOSIS — C50411 Malignant neoplasm of upper-outer quadrant of right female breast: Secondary | ICD-10-CM

## 2014-07-21 ENCOUNTER — Other Ambulatory Visit (HOSPITAL_BASED_OUTPATIENT_CLINIC_OR_DEPARTMENT_OTHER): Payer: BC Managed Care – PPO

## 2014-07-21 ENCOUNTER — Ambulatory Visit (HOSPITAL_BASED_OUTPATIENT_CLINIC_OR_DEPARTMENT_OTHER): Payer: BC Managed Care – PPO

## 2014-07-21 ENCOUNTER — Other Ambulatory Visit: Payer: Self-pay | Admitting: Hematology and Oncology

## 2014-07-21 VITALS — BP 135/74 | HR 86 | Temp 98.4°F | Resp 20

## 2014-07-21 DIAGNOSIS — C50411 Malignant neoplasm of upper-outer quadrant of right female breast: Secondary | ICD-10-CM

## 2014-07-21 DIAGNOSIS — C50419 Malignant neoplasm of upper-outer quadrant of unspecified female breast: Secondary | ICD-10-CM

## 2014-07-21 DIAGNOSIS — Z5112 Encounter for antineoplastic immunotherapy: Secondary | ICD-10-CM

## 2014-07-21 LAB — CBC WITH DIFFERENTIAL/PLATELET
BASO%: 0.8 % (ref 0.0–2.0)
Basophils Absolute: 0.1 10*3/uL (ref 0.0–0.1)
EOS%: 0.1 % (ref 0.0–7.0)
Eosinophils Absolute: 0 10*3/uL (ref 0.0–0.5)
HEMATOCRIT: 37.9 % (ref 34.8–46.6)
HEMOGLOBIN: 12 g/dL (ref 11.6–15.9)
LYMPH%: 15.4 % (ref 14.0–49.7)
MCH: 26.1 pg (ref 25.1–34.0)
MCHC: 31.5 g/dL (ref 31.5–36.0)
MCV: 82.7 fL (ref 79.5–101.0)
MONO#: 0.8 10*3/uL (ref 0.1–0.9)
MONO%: 6.5 % (ref 0.0–14.0)
NEUT%: 77.2 % — AB (ref 38.4–76.8)
NEUTROS ABS: 9.9 10*3/uL — AB (ref 1.5–6.5)
PLATELETS: 186 10*3/uL (ref 145–400)
RBC: 4.59 10*6/uL (ref 3.70–5.45)
RDW: 15.4 % — ABNORMAL HIGH (ref 11.2–14.5)
WBC: 12.8 10*3/uL — AB (ref 3.9–10.3)
lymph#: 2 10*3/uL (ref 0.9–3.3)

## 2014-07-21 LAB — COMPREHENSIVE METABOLIC PANEL (CC13)
ALT: 31 U/L (ref 0–55)
AST: 22 U/L (ref 5–34)
Albumin: 3.9 g/dL (ref 3.5–5.0)
Alkaline Phosphatase: 115 U/L (ref 40–150)
Anion Gap: 11 mEq/L (ref 3–11)
BUN: 11.7 mg/dL (ref 7.0–26.0)
CO2: 24 mEq/L (ref 22–29)
Calcium: 9.7 mg/dL (ref 8.4–10.4)
Chloride: 106 mEq/L (ref 98–109)
Creatinine: 0.7 mg/dL (ref 0.6–1.1)
Glucose: 192 mg/dl — ABNORMAL HIGH (ref 70–140)
Potassium: 4.2 mEq/L (ref 3.5–5.1)
SODIUM: 140 meq/L (ref 136–145)
TOTAL PROTEIN: 7.3 g/dL (ref 6.4–8.3)
Total Bilirubin: <0.2 mg/dL (ref 0.20–1.20)

## 2014-07-21 MED ORDER — SODIUM CHLORIDE 0.9 % IJ SOLN
10.0000 mL | INTRAMUSCULAR | Status: DC | PRN
  Administered 2014-07-21: 10 mL
  Filled 2014-07-21: qty 10

## 2014-07-21 MED ORDER — TRASTUZUMAB CHEMO INJECTION 440 MG
2.0000 mg/kg | Freq: Once | INTRAVENOUS | Status: AC
  Administered 2014-07-21: 189 mg via INTRAVENOUS
  Filled 2014-07-21: qty 9

## 2014-07-21 MED ORDER — ACETAMINOPHEN 325 MG PO TABS
ORAL_TABLET | ORAL | Status: AC
  Filled 2014-07-21: qty 2

## 2014-07-21 MED ORDER — ACETAMINOPHEN 325 MG PO TABS
650.0000 mg | ORAL_TABLET | Freq: Once | ORAL | Status: AC
  Administered 2014-07-21: 650 mg via ORAL

## 2014-07-21 MED ORDER — DIPHENHYDRAMINE HCL 25 MG PO CAPS
ORAL_CAPSULE | ORAL | Status: AC
  Filled 2014-07-21: qty 2

## 2014-07-21 MED ORDER — HEPARIN SOD (PORK) LOCK FLUSH 100 UNIT/ML IV SOLN
500.0000 [IU] | Freq: Once | INTRAVENOUS | Status: AC | PRN
  Administered 2014-07-21: 500 [IU]
  Filled 2014-07-21: qty 5

## 2014-07-21 MED ORDER — DIPHENHYDRAMINE HCL 25 MG PO CAPS
50.0000 mg | ORAL_CAPSULE | Freq: Once | ORAL | Status: AC
  Administered 2014-07-21: 50 mg via ORAL

## 2014-07-21 MED ORDER — SODIUM CHLORIDE 0.9 % IV SOLN
Freq: Once | INTRAVENOUS | Status: AC
  Administered 2014-07-21: 10:00:00 via INTRAVENOUS

## 2014-07-21 NOTE — Patient Instructions (Signed)
[facility] Discharge Instructions for Patients Receiving Chemotherapy  Today you received the following chemotherapy agents Herceptin  To help prevent nausea and vomiting after your treatment, we encourage you to take your nausea medication Zofran 8mg  every 12 hours as needed If you develop nausea and vomiting that is not controlled by your nausea medication, call the clinic.   BELOW ARE SYMPTOMS THAT SHOULD BE REPORTED IMMEDIATELY:  *FEVER GREATER THAN 100.5 F  *CHILLS WITH OR WITHOUT FEVER  NAUSEA AND VOMITING THAT IS NOT CONTROLLED WITH YOUR NAUSEA MEDICATION  *UNUSUAL SHORTNESS OF BREATH  *UNUSUAL BRUISING OR BLEEDING  TENDERNESS IN MOUTH AND THROAT WITH OR WITHOUT PRESENCE OF ULCERS  *URINARY PROBLEMS  *BOWEL PROBLEMS  UNUSUAL RASH Items with * indicate a potential emergency and should be followed up as soon as possible.  Feel free to call the clinic you have any questions or concerns. The clinic phone number is [phone].

## 2014-07-22 ENCOUNTER — Telehealth: Payer: Self-pay | Admitting: Oncology

## 2014-07-22 NOTE — Telephone Encounter (Signed)
added additonal appts for aug thru oct per 8/7 pof. lmonvm for pt and pt to get new schedule at next appt 8/21. pt also has referral to rheumaology. message to GM's schedule to follow up on referral and contact pt.

## 2014-07-24 ENCOUNTER — Telehealth: Payer: Self-pay | Admitting: Oncology

## 2014-07-24 NOTE — Telephone Encounter (Signed)
Cld 7 spoke to office-they require demo, med rec to be faxed to [phone]-sent message to Brandy Jackson to forward med rec. Once recvd & revwd they can make appt @ that time

## 2014-07-25 ENCOUNTER — Telehealth: Payer: Self-pay | Admitting: Oncology

## 2014-07-25 NOTE — Telephone Encounter (Signed)
Medical records faxed to Dr. Amil Amen @ [phone]

## 2014-07-25 NOTE — Telephone Encounter (Signed)
spoke to Tiffany in Med Rec to fax over med rec & demo to Dr Amil Amen office-stated faxing [phone]

## 2014-07-28 ENCOUNTER — Ambulatory Visit (HOSPITAL_BASED_OUTPATIENT_CLINIC_OR_DEPARTMENT_OTHER): Payer: BC Managed Care – PPO

## 2014-07-28 ENCOUNTER — Other Ambulatory Visit: Payer: BC Managed Care – PPO

## 2014-07-28 VITALS — BP 121/65 | HR 91 | Temp 98.1°F | Resp 18

## 2014-07-28 DIAGNOSIS — Z5112 Encounter for antineoplastic immunotherapy: Secondary | ICD-10-CM

## 2014-07-28 DIAGNOSIS — C50411 Malignant neoplasm of upper-outer quadrant of right female breast: Secondary | ICD-10-CM

## 2014-07-28 DIAGNOSIS — C50419 Malignant neoplasm of upper-outer quadrant of unspecified female breast: Secondary | ICD-10-CM

## 2014-07-28 MED ORDER — HEPARIN SOD (PORK) LOCK FLUSH 100 UNIT/ML IV SOLN
500.0000 [IU] | Freq: Once | INTRAVENOUS | Status: AC | PRN
  Administered 2014-07-28: 500 [IU]
  Filled 2014-07-28: qty 5

## 2014-07-28 MED ORDER — ACETAMINOPHEN 325 MG PO TABS
ORAL_TABLET | ORAL | Status: AC
  Filled 2014-07-28: qty 2

## 2014-07-28 MED ORDER — SODIUM CHLORIDE 0.9 % IJ SOLN
10.0000 mL | INTRAMUSCULAR | Status: DC | PRN
  Administered 2014-07-28: 10 mL
  Filled 2014-07-28: qty 10

## 2014-07-28 MED ORDER — DIPHENHYDRAMINE HCL 25 MG PO CAPS
ORAL_CAPSULE | ORAL | Status: AC
  Filled 2014-07-28: qty 2

## 2014-07-28 MED ORDER — ACETAMINOPHEN 325 MG PO TABS
650.0000 mg | ORAL_TABLET | Freq: Once | ORAL | Status: AC
  Administered 2014-07-28: 650 mg via ORAL

## 2014-07-28 MED ORDER — TRASTUZUMAB CHEMO INJECTION 440 MG
2.0000 mg/kg | Freq: Once | INTRAVENOUS | Status: AC
  Administered 2014-07-28: 189 mg via INTRAVENOUS
  Filled 2014-07-28: qty 9

## 2014-07-28 MED ORDER — DIPHENHYDRAMINE HCL 25 MG PO CAPS
50.0000 mg | ORAL_CAPSULE | Freq: Once | ORAL | Status: AC
  Administered 2014-07-28: 50 mg via ORAL

## 2014-07-28 MED ORDER — SODIUM CHLORIDE 0.9 % IV SOLN
Freq: Once | INTRAVENOUS | Status: AC
  Administered 2014-07-28: 11:00:00 via INTRAVENOUS

## 2014-07-28 NOTE — Patient Instructions (Signed)
[facility] Discharge Instructions for Patients Receiving Chemotherapy  Today you received the following chemotherapy agent Herceptin  To help prevent nausea and vomiting after your treatment, we encourage you to take your nausea medication  compazineIf you develop nausea and vomiting that is not controlled by your nausea medication, call the clinic.   BELOW ARE SYMPTOMS THAT SHOULD BE REPORTED IMMEDIATELY:  *FEVER GREATER THAN 100.5 F  *CHILLS WITH OR WITHOUT FEVER  NAUSEA AND VOMITING THAT IS NOT CONTROLLED WITH YOUR NAUSEA MEDICATION  *UNUSUAL SHORTNESS OF BREATH  *UNUSUAL BRUISING OR BLEEDING  TENDERNESS IN MOUTH AND THROAT WITH OR WITHOUT PRESENCE OF ULCERS  *URINARY PROBLEMS  *BOWEL PROBLEMS  UNUSUAL RASH Items with * indicate a potential emergency and should be followed up as soon as possible.  Feel free to call the clinic you have any questions or concerns. The clinic phone number is [phone].

## 2014-08-03 ENCOUNTER — Other Ambulatory Visit: Payer: Self-pay | Admitting: *Deleted

## 2014-08-03 DIAGNOSIS — C50411 Malignant neoplasm of upper-outer quadrant of right female breast: Secondary | ICD-10-CM

## 2014-08-04 ENCOUNTER — Other Ambulatory Visit (HOSPITAL_BASED_OUTPATIENT_CLINIC_OR_DEPARTMENT_OTHER): Payer: BC Managed Care – PPO

## 2014-08-04 ENCOUNTER — Telehealth: Payer: Self-pay | Admitting: Adult Health

## 2014-08-04 ENCOUNTER — Other Ambulatory Visit: Payer: Self-pay | Admitting: Oncology

## 2014-08-04 ENCOUNTER — Telehealth: Payer: Self-pay | Admitting: *Deleted

## 2014-08-04 ENCOUNTER — Ambulatory Visit (HOSPITAL_BASED_OUTPATIENT_CLINIC_OR_DEPARTMENT_OTHER): Payer: BC Managed Care – PPO

## 2014-08-04 ENCOUNTER — Other Ambulatory Visit: Payer: Self-pay | Admitting: *Deleted

## 2014-08-04 ENCOUNTER — Ambulatory Visit (HOSPITAL_BASED_OUTPATIENT_CLINIC_OR_DEPARTMENT_OTHER): Payer: BC Managed Care – PPO | Admitting: Adult Health

## 2014-08-04 ENCOUNTER — Encounter: Payer: Self-pay | Admitting: Adult Health

## 2014-08-04 VITALS — BP 135/62 | HR 75 | Temp 98.5°F | Ht 67.0 in | Wt 211.4 lb

## 2014-08-04 DIAGNOSIS — Z5111 Encounter for antineoplastic chemotherapy: Secondary | ICD-10-CM

## 2014-08-04 DIAGNOSIS — T451X5A Adverse effect of antineoplastic and immunosuppressive drugs, initial encounter: Secondary | ICD-10-CM

## 2014-08-04 DIAGNOSIS — G622 Polyneuropathy due to other toxic agents: Secondary | ICD-10-CM

## 2014-08-04 DIAGNOSIS — Z5112 Encounter for antineoplastic immunotherapy: Secondary | ICD-10-CM

## 2014-08-04 DIAGNOSIS — G620 Drug-induced polyneuropathy: Secondary | ICD-10-CM

## 2014-08-04 DIAGNOSIS — C50419 Malignant neoplasm of upper-outer quadrant of unspecified female breast: Secondary | ICD-10-CM

## 2014-08-04 DIAGNOSIS — E119 Type 2 diabetes mellitus without complications: Secondary | ICD-10-CM

## 2014-08-04 DIAGNOSIS — C50411 Malignant neoplasm of upper-outer quadrant of right female breast: Secondary | ICD-10-CM

## 2014-08-04 DIAGNOSIS — R5383 Other fatigue: Secondary | ICD-10-CM

## 2014-08-04 DIAGNOSIS — E118 Type 2 diabetes mellitus with unspecified complications: Secondary | ICD-10-CM

## 2014-08-04 DIAGNOSIS — R5381 Other malaise: Secondary | ICD-10-CM

## 2014-08-04 DIAGNOSIS — R197 Diarrhea, unspecified: Secondary | ICD-10-CM | POA: Insufficient documentation

## 2014-08-04 HISTORY — DX: Type 2 diabetes mellitus with unspecified complications: E11.8

## 2014-08-04 HISTORY — DX: Adverse effect of antineoplastic and immunosuppressive drugs, initial encounter: G62.0

## 2014-08-04 HISTORY — DX: Drug-induced polyneuropathy: T45.1X5A

## 2014-08-04 LAB — COMPREHENSIVE METABOLIC PANEL (CC13)
ALK PHOS: 69 U/L (ref 40–150)
ALT: 19 U/L (ref 0–55)
AST: 20 U/L (ref 5–34)
Albumin: 3.5 g/dL (ref 3.5–5.0)
Anion Gap: 10 mEq/L (ref 3–11)
BILIRUBIN TOTAL: 0.38 mg/dL (ref 0.20–1.20)
BUN: 10.4 mg/dL (ref 7.0–26.0)
CO2: 22 mEq/L (ref 22–29)
Calcium: 8.9 mg/dL (ref 8.4–10.4)
Chloride: 110 mEq/L — ABNORMAL HIGH (ref 98–109)
Creatinine: 0.7 mg/dL (ref 0.6–1.1)
GLUCOSE: 216 mg/dL — AB (ref 70–140)
POTASSIUM: 3.6 meq/L (ref 3.5–5.1)
SODIUM: 142 meq/L (ref 136–145)
TOTAL PROTEIN: 6.6 g/dL (ref 6.4–8.3)

## 2014-08-04 LAB — CBC WITH DIFFERENTIAL/PLATELET
BASO%: 1.1 % (ref 0.0–2.0)
Basophils Absolute: 0.1 10*3/uL (ref 0.0–0.1)
EOS ABS: 0 10*3/uL (ref 0.0–0.5)
EOS%: 0.1 % (ref 0.0–7.0)
HCT: 36.2 % (ref 34.8–46.6)
HGB: 11.4 g/dL — ABNORMAL LOW (ref 11.6–15.9)
LYMPH%: 33.1 % (ref 14.0–49.7)
MCH: 26.5 pg (ref 25.1–34.0)
MCHC: 31.6 g/dL (ref 31.5–36.0)
MCV: 84.1 fL (ref 79.5–101.0)
MONO#: 0.6 10*3/uL (ref 0.1–0.9)
MONO%: 8.2 % (ref 0.0–14.0)
NEUT#: 4.1 10*3/uL (ref 1.5–6.5)
NEUT%: 57.5 % (ref 38.4–76.8)
Platelets: 231 10*3/uL (ref 145–400)
RBC: 4.31 10*6/uL (ref 3.70–5.45)
RDW: 17.1 % — ABNORMAL HIGH (ref 11.2–14.5)
WBC: 7.1 10*3/uL (ref 3.9–10.3)
lymph#: 2.3 10*3/uL (ref 0.9–3.3)

## 2014-08-04 MED ORDER — DIPHENHYDRAMINE HCL 25 MG PO CAPS
ORAL_CAPSULE | ORAL | Status: AC
  Filled 2014-08-04: qty 1

## 2014-08-04 MED ORDER — HEPARIN SOD (PORK) LOCK FLUSH 100 UNIT/ML IV SOLN
500.0000 [IU] | Freq: Once | INTRAVENOUS | Status: AC | PRN
  Administered 2014-08-04: 500 [IU]
  Filled 2014-08-04: qty 5

## 2014-08-04 MED ORDER — SODIUM CHLORIDE 0.9 % IV SOLN
Freq: Once | INTRAVENOUS | Status: AC
  Administered 2014-08-04: 11:00:00 via INTRAVENOUS

## 2014-08-04 MED ORDER — TRASTUZUMAB CHEMO INJECTION 440 MG
2.0000 mg/kg | Freq: Once | INTRAVENOUS | Status: AC
  Administered 2014-08-04: 189 mg via INTRAVENOUS
  Filled 2014-08-04: qty 9

## 2014-08-04 MED ORDER — ONDANSETRON 16 MG/50ML IVPB (CHCC)
16.0000 mg | Freq: Once | INTRAVENOUS | Status: AC
  Administered 2014-08-04: 16 mg via INTRAVENOUS

## 2014-08-04 MED ORDER — DEXAMETHASONE SODIUM PHOSPHATE 20 MG/5ML IJ SOLN
INTRAMUSCULAR | Status: AC
  Filled 2014-08-04: qty 5

## 2014-08-04 MED ORDER — CARBOPLATIN CHEMO INJECTION 600 MG/60ML
710.0000 mg | Freq: Once | INTRAVENOUS | Status: AC
  Administered 2014-08-04: 710 mg via INTRAVENOUS
  Filled 2014-08-04: qty 71

## 2014-08-04 MED ORDER — DIPHENHYDRAMINE HCL 25 MG PO CAPS
25.0000 mg | ORAL_CAPSULE | Freq: Once | ORAL | Status: AC
  Administered 2014-08-04: 25 mg via ORAL

## 2014-08-04 MED ORDER — DEXAMETHASONE SODIUM PHOSPHATE 20 MG/5ML IJ SOLN
20.0000 mg | Freq: Once | INTRAMUSCULAR | Status: AC
  Administered 2014-08-04: 20 mg via INTRAVENOUS

## 2014-08-04 MED ORDER — DOCETAXEL CHEMO INJECTION 160 MG/16ML
75.0000 mg/m2 | Freq: Once | INTRAVENOUS | Status: AC
  Administered 2014-08-04: 160 mg via INTRAVENOUS
  Filled 2014-08-04: qty 16

## 2014-08-04 MED ORDER — ONDANSETRON 16 MG/50ML IVPB (CHCC)
INTRAVENOUS | Status: AC
  Filled 2014-08-04: qty 16

## 2014-08-04 MED ORDER — LIDOCAINE-PRILOCAINE 2.5-2.5 % EX CREA: TOPICAL_CREAM | CUTANEOUS | Status: DC | PRN

## 2014-08-04 MED ORDER — SODIUM CHLORIDE 0.9 % IJ SOLN
10.0000 mL | INTRAMUSCULAR | Status: DC | PRN
  Administered 2014-08-04: 10 mL
  Filled 2014-08-04: qty 10

## 2014-08-04 MED ORDER — ACETAMINOPHEN 325 MG PO TABS
650.0000 mg | ORAL_TABLET | Freq: Once | ORAL | Status: AC
  Administered 2014-08-04: 650 mg via ORAL

## 2014-08-04 MED ORDER — ACETAMINOPHEN 325 MG PO TABS
ORAL_TABLET | ORAL | Status: AC
  Filled 2014-08-04: qty 2

## 2014-08-04 NOTE — Progress Notes (Signed)
ID: Brandy Jackson OB: 05-Jul-1951  MR#: [id number]  WYO#:[id number]  PCP: Ricke Hey, MD GYN:   SU: Dr. Brantley Stage OTHER MD:  CHIEF COMPLAINT:  Right-sided breast cancer  CURRENT THERAPY:receiving adjuvant chemotherapy  BREAST CANCER HISTORY: From the original consult note:  "Brandy Jackson is a 63 y.o. female. Patient palpated a right breast mass. She had a mammogram performed and she was noted to have a mass in the upper outer quadrant measuring 2.1 x 2.0 x 1.7 cm. 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 Tumor was ER positive PR positive HER-2/neu positive (triple positive) with a proliferation marker Ki-67 at 90%. She had MRI of the breasts performed which showed a solitary mass in the upper outer quadrant measuring 2.5 cm. Her case was discussed at the multidisciplinary breast conference."  Her subsequent treatment is as detailed below.   INTERVAL HISTORY:   Brandy Jackson returns today accompanied her husband for a followup visit. This is day 1 cycle 4 of 6 planned cycles of carboplatin, docetaxel, and trastuzumab, given with Neulasta on day 2.  [name] is doing well today.  She is having some more fatigue and would like to stay out of work for a little bit longer.  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  Occasionally, she is so tired and fatigued she sleeps as soon as getting home from work.  She does have some mild numbness that occurs occasionally in the toes of her right feet and in her right wrist.  She is taking B complex for this.  She does have some diarrhea due to the Metformin, and she takes Questran BID which is helpful.  She denies fevers, chills, nausea, vomiting, constipation, skin changes, or any further concerns.    REVIEW OF SYSTEMS:  A 10 point review of systems was conducted and is otherwise negative except for what  is noted above.    PAST MEDICAL HISTORY: Past Medical History  Diagnosis Date  . Diabetes mellitus without complication   . Arthritis   . Hot flashes   . Cancer   . Breast cancer   . Wears glasses   . Wears partial dentures     bottom    PAST SURGICAL HISTORY: Past Surgical History  Procedure Laterality Date  . Bladder repair w/ cesarean section    . Colonoscopy    . Portacath placement N/A 04/11/2014    Procedure: INSERTION PORT-A-CATH;  Surgeon: Joyice Faster. Cornett, MD;  Location: [facility];  Service: General;  Laterality: N/A;  . Re-excision of breast lumpectomy Right 06/06/2014    Procedure: RE-EXCISION OF BREAST LUMPECTOMY;  Surgeon: Marcello Moores A. Cornett, MD;  Location: [facility];  Service: General;  Laterality: Right;    FAMILY HISTORY No family history on file.  GYNECOLOGIC HISTORY: menarche at age 36, G86 P3, went through menopause at age 12.  No h/o of HRT, no h/o abnormal pap smears, or sexually transmitted infections.  Does have frequent vaginal candida infections due to diabetes that she follows with her gynecologist for.    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 Her husband [name] is retired from [employer]. He is a cancer survivor himself. The patient has 3 sons, one of whom is studying to be a pediatrician.  All her children are married. She has 6 grandchildren.    ADVANCED DIRECTIVES: not in place.    HEALTH MAINTENANCE: History  Substance Use Topics  . Smoking status: Former Smoker -- 1.00 packs/day    Quit date: 03/08/1986  . Smokeless tobacco: Never Used  . Alcohol Use: No      Mammogram: Colonoscopy: Bone Density Scan:  Pap Smear:  Eye Exam:  Vitamin D Level:   Lipid Panel:    Allergies  Allergen Reactions  . Sulfa Antibiotics Rash    Current Outpatient Prescriptions  Medication Sig Dispense Refill  . LORazepam (ATIVAN) 0.5 MG  tablet Take 1 tablet (0.5 mg total) by mouth every 8 (eight) hours.  30 tablet  0  . metFORMIN (GLUCOPHAGE) 500 MG tablet Take 500 mg by mouth 2 (two) times daily with a meal.      . ondansetron (ZOFRAN) 8 MG tablet Take 1 tablet (8 mg total) by mouth 2 (two) times daily. Take starting the day after chemo for three days, then take twice a day PRN.  20 tablet  1  . prochlorperazine (COMPAZINE) 10 MG tablet Take 1 tablet (10 mg total) by mouth every 6 (six) hours as needed for nausea or vomiting.  30 tablet  0  . cholestyramine (QUESTRAN) 4 G packet Take 1 packet (4 g total) by mouth 2 (two) times daily.  60 each  12  . dexamethasone (DECADRON) 4 MG tablet Take 2 tablets (8 mg total) by mouth 2 (two) times daily. Take the day before chemo, then the day after chemo x 3 days.  30 tablet  2  . lidocaine-prilocaine (EMLA) cream Apply topically as needed.  30 g  1  . oxyCODONE-acetaminophen (ROXICET) 5-325 MG per tablet Take 1 tablet by mouth every 4 (four) hours as needed.  30 tablet  0   No current facility-administered medications for this visit.   Facility-Administered Medications Ordered in Other Visits  Medication Dose Route Frequency Provider Last Rate Last Dose  . CARBOplatin (PARAPLATIN) 710 mg in sodium chloride 0.9 % 250 mL chemo infusion  710 mg Intravenous Once Chauncey Cruel, MD      . DOCEtaxel (TAXOTERE) 160 mg in dextrose 5 % 250 mL chemo infusion  75 mg/m2 (Treatment Plan Actual) Intravenous Once Chauncey Cruel, MD      . heparin lock flush 100 unit/mL  500 Units Intracatheter Once PRN Chauncey Cruel, MD      . ondansetron (ZOFRAN) IVPB 16 mg  16 mg Intravenous Once Chauncey Cruel, MD   16 mg at 08/04/14 1124  . sodium chloride 0.9 % injection 10 mL  10 mL Intracatheter PRN Chauncey Cruel, MD      . trastuzumab (HERCEPTIN) 189 mg in sodium chloride 0.9 % 250 mL chemo infusion  2 mg/kg (Treatment Plan Actual) Intravenous Once Chauncey Cruel, MD        OBJECTIVE:  Middle-aged [place] American woman who appears stated age 30 Vitals:   08/04/14 0846  BP: 135/62  Pulse: 75  Temp: 98.5 F (36.9 C)     Body mass index is 33.1 kg/(m^2).     GENERAL: Patient is a well appearing female in no acute distress HEENT:  Sclerae anicteric.  Oropharynx clear and moist. No ulcerations or evidence of oropharyngeal candidiasis. Neck is supple.  NODES:  No cervical, supraclavicular, or axillary lymphadenopathy palpated.  BREAST EXAM:  Deferred. LUNGS:  Clear to auscultation bilaterally.  No wheezes or rhonchi. HEART:  Regular rate and rhythm. No murmur appreciated. ABDOMEN:  Soft, nontender.  Positive, normoactive bowel sounds. No organomegaly palpated. MSK:  No focal spinal tenderness to palpation. Full range of motion bilaterally in the upper extremities. EXTREMITIES:  No peripheral edema.   SKIN:  Clear with no obvious rashes or skin changes. No nail dyscrasia. NEURO:  Nonfocal. Well oriented.  Appropriate affect. ECOG FS:2    LAB RESULTS:  CMP     Component Value Date/Time   NA 142 08/04/2014 0818   NA 142 04/07/2014 1320   K 3.6 08/04/2014 0818   K 4.2 04/07/2014 1320   CL 103 04/07/2014 1320   CO2 22 08/04/2014 0818   CO2 25 04/07/2014 1320   GLUCOSE 216* 08/04/2014 0818   GLUCOSE 126* 04/07/2014 1320   BUN 10.4 08/04/2014 0818   BUN 16 04/07/2014 1320   CREATININE 0.7 08/04/2014 0818   CREATININE 0.56 04/07/2014 1320   CALCIUM 8.9 08/04/2014 0818   CALCIUM 9.4 04/07/2014 1320   PROT 6.6 08/04/2014 0818   PROT 7.8 04/07/2014 1320   ALBUMIN 3.5 08/04/2014 0818   ALBUMIN 3.9 04/07/2014 1320   AST 20 08/04/2014 0818   AST 20 04/07/2014 1320   ALT 19 08/04/2014 0818   ALT 19 04/07/2014 1320   ALKPHOS 69 08/04/2014 0818   ALKPHOS 68 04/07/2014 1320   BILITOT 0.38 08/04/2014 0818   BILITOT 0.2* 04/07/2014 1320   GFRNONAA >90 04/07/2014 1320   GFRAA >90 04/07/2014 1320    I No results found for this basename: SPEP,  UPEP,   kappa and lambda light chains    Lab Results   Component Value Date   WBC 7.1 08/04/2014   NEUTROABS 4.1 08/04/2014   HGB 11.4* 08/04/2014   HCT 36.2 08/04/2014   MCV 84.1 08/04/2014   PLT 231 08/04/2014      Chemistry      Component Value Date/Time   NA 142 08/04/2014 0818   NA 142 04/07/2014 1320   K 3.6 08/04/2014 0818   K 4.2 04/07/2014 1320   CL 103 04/07/2014 1320   CO2 22 08/04/2014 0818   CO2 25 04/07/2014 1320   BUN 10.4 08/04/2014 0818   BUN 16 04/07/2014 1320   CREATININE 0.7 08/04/2014 0818   CREATININE 0.56 04/07/2014 1320      Component Value Date/Time   CALCIUM 8.9 08/04/2014 0818   CALCIUM 9.4 04/07/2014 1320   ALKPHOS 69 08/04/2014 0818   ALKPHOS 68 04/07/2014 1320   AST 20 08/04/2014 0818   AST 20 04/07/2014 1320   ALT 19 08/04/2014 0818   ALT 19 04/07/2014 1320   BILITOT 0.38 08/04/2014 0818   BILITOT 0.2* 04/07/2014 1320       No results found for this basename: LABCA2    No components found with this basename: LABCA125    No results found for this basename: INR,  in the last 168 hours  Urinalysis No results found for this basename: colorurine,  appearanceur,  labspec,  phurine,  glucoseu,  hgbur,  bilirubinur,  ketonesur,  proteinur,  urobilinogen,  nitrite,  leukocytesur    STUDIES: No results found.  ASSESSMENT: 63 y.o. [place],woman status post right lumpectomy and sentinel lymph node sampling 04/11/2014 for a pT2 pN1, stage IIB invasive ductal carcinoma, grade 3, estrogen receptor strongly positive, progesterone receptor negative, with an MIB-1 of 94% and HER-2 amplification  (1) additional surgery in 06/06/2014 for margin clearance showed no evidence of residual malignancy  (2) received the  first of six planned cycles of adjuvant chemotherapy with carboplatin docetaxel and trastuzumab 05/02/2014, with chemotherapy interrupted because of her intervening surgery; chemo to be resumed 06/26/2014  (3) continuing trastuzumab through May 2016 to complete 1 year, most recent echocardiogram 04/05/2014 showed a  normal ejection fraction  (4) the patient will need adjuvant radiation at the completion of chemotherapy  (5) she will start antiestrogen therapy at the completion of radiation  PLAN:  Tiyonna is doing well today.  I reviewed her lab work with her in detail. She does have a slight anemia and this is likely treatment related.  She will proceed with her fourth cycle of Docetaxel, Carboplatin, and Trastuzumab.    Her blood glucose is 216 today.  She is taking Metformin 1079m daily, and I recommended she continue this.  She is going to take Dexamethasone 467mBID following treatment instead of 23m63mID following treatment.  The Metformin does cause diarrhea, and I recommended that she continue taking Questran and Imodium as directed by Dr. MagJana Hakim  BarJaslynnderwent an echocardiogram on 07/03/14, her LVEF was 60-65%.  She has f/u with Dr. BenHaroldine Laws Dr. MclAundra Dubin Monday, 08/07/14.    Due to her fatigue, and feeling wiped out at work, along with sleeping often once she gets home, she is going to talk with her boss at work about taking some time off until her treatment is completed.  I think that more time off until chemotherapy is completed a good idea.  She is likely experiencing a cumulative effect of the fatigue related to the chemotherapy.   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  We will monitor it closely.  Should it worsen changing therapy may need to be considered as it is a side effect of the Docetaxel.    BarTaytes a good understanding of the overall plan. She agrees with it. She knows the goal of treatment in her case is cure. She will call with any problems that may develop before her next visit here.  I spent 40 minutes counseling the patient face to face.  The total time spent in the appointment was 50 minutes.  LinMinette HeadlandP [phone]/2015 11:32 AM

## 2014-08-04 NOTE — Telephone Encounter (Signed)
Per staff message I have adjusted 9/4 appt

## 2014-08-04 NOTE — Patient Instructions (Signed)
[facility] Discharge Instructions for Patients Receiving Chemotherapy  Today you received the following chemotherapy agents:  Taxotere, Carboplatin and Herceptin  To help prevent nausea and vomiting after your treatment, we encourage you to take your nausea medication as ordered per MD.   If you develop nausea and vomiting that is not controlled by your nausea medication, call the clinic.   BELOW ARE SYMPTOMS THAT SHOULD BE REPORTED IMMEDIATELY:  *FEVER GREATER THAN 100.5 F  *CHILLS WITH OR WITHOUT FEVER  NAUSEA AND VOMITING THAT IS NOT CONTROLLED WITH YOUR NAUSEA MEDICATION  *UNUSUAL SHORTNESS OF BREATH  *UNUSUAL BRUISING OR BLEEDING  TENDERNESS IN MOUTH AND THROAT WITH OR WITHOUT PRESENCE OF ULCERS  *URINARY PROBLEMS  *BOWEL PROBLEMS  UNUSUAL RASH Items with * indicate a potential emergency and should be followed up as soon as possible.  Feel free to call the clinic you have any questions or concerns. The clinic phone number is [phone].    

## 2014-08-04 NOTE — Telephone Encounter (Signed)
per pof to sch pt appt-sch-sent VG email to adv [facility] would not sch dexa because pt not due until 12/16-adv pt that we would call if something changes-once reply will contact pt

## 2014-08-05 ENCOUNTER — Ambulatory Visit (HOSPITAL_BASED_OUTPATIENT_CLINIC_OR_DEPARTMENT_OTHER): Payer: BC Managed Care – PPO

## 2014-08-05 VITALS — BP 154/54 | HR 75 | Temp 98.6°F | Resp 18

## 2014-08-05 DIAGNOSIS — C50411 Malignant neoplasm of upper-outer quadrant of right female breast: Secondary | ICD-10-CM

## 2014-08-05 DIAGNOSIS — C50419 Malignant neoplasm of upper-outer quadrant of unspecified female breast: Secondary | ICD-10-CM

## 2014-08-05 DIAGNOSIS — Z5189 Encounter for other specified aftercare: Secondary | ICD-10-CM

## 2014-08-05 MED ORDER — PEGFILGRASTIM INJECTION 6 MG/0.6ML
6.0000 mg | Freq: Once | SUBCUTANEOUS | Status: AC
  Administered 2014-08-05: 6 mg via SUBCUTANEOUS

## 2014-08-05 NOTE — Patient Instructions (Signed)
Pegfilgrastim injection What is this medicine? PEGFILGRASTIM (peg fil GRA stim) is a long-acting granulocyte colony-stimulating factor that stimulates the growth of neutrophils, a type of white blood cell important in the body's fight against infection. It is used to reduce the incidence of fever and infection in patients with certain types of cancer who are receiving chemotherapy that affects the bone marrow. This medicine may be used for other purposes; ask your health care provider or pharmacist if you have questions. COMMON BRAND NAME(S): Neulasta What should I tell my health care provider before I take this medicine? They need to know if you have any of these conditions: -latex allergy -ongoing radiation therapy -sickle cell disease -skin reactions to acrylic adhesives (On-Body Injector only) -an unusual or allergic reaction to pegfilgrastim, filgrastim, other medicines, foods, dyes, or preservatives -pregnant or trying to get pregnant -breast-feeding How should I use this medicine? This medicine is for injection under the skin. If you get this medicine at home, you will be taught how to prepare and give the pre-filled syringe or how to use the On-body Injector. Refer to the patient Instructions for Use for detailed instructions. Use exactly as directed. Take your medicine at regular intervals. Do not take your medicine more often than directed. It is important that you put your used needles and syringes in a special sharps container. Do not put them in a trash can. If you do not have a sharps container, call your pharmacist or healthcare provider to get one. Talk to your pediatrician regarding the use of this medicine in children. Special care may be needed. Overdosage: If you think you have taken too much of this medicine contact a poison control center or emergency room at once. NOTE: This medicine is only for you. Do not share this medicine with others. What if I miss a dose? It is  important not to miss your dose. Call your doctor or health care professional if you miss your dose. If you miss a dose due to an On-body Injector failure or leakage, a new dose should be administered as soon as possible using a single prefilled syringe for manual use. What may interact with this medicine? Interactions have not been studied. Give your health care provider a list of all the medicines, herbs, non-prescription drugs, or dietary supplements you use. Also tell them if you smoke, drink alcohol, or use illegal drugs. Some items may interact with your medicine. This list may not describe all possible interactions. Give your health care provider a list of all the medicines, herbs, non-prescription drugs, or dietary supplements you use. Also tell them if you smoke, drink alcohol, or use illegal drugs. Some items may interact with your medicine. What should I watch for while using this medicine? You may need blood work done while you are taking this medicine. If you are going to need a MRI, CT scan, or other procedure, tell your doctor that you are using this medicine (On-Body Injector only). What side effects may I notice from receiving this medicine? Side effects that you should report to your doctor or health care professional as soon as possible: -allergic reactions like skin rash, itching or hives, swelling of the face, lips, or tongue -dizziness -fever -pain, redness, or irritation at site where injected -pinpoint red spots on the skin -shortness of breath or breathing problems -stomach or side pain, or pain at the shoulder -swelling -tiredness -trouble passing urine Side effects that usually do not require medical attention (report to your doctor   or health care professional if they continue or are bothersome): -bone pain -muscle pain This list may not describe all possible side effects. Call your doctor for medical advice about side effects. You may report side effects to FDA at  1-800-FDA-1088. Where should I keep my medicine? Keep out of the reach of children. Store pre-filled syringes in a refrigerator between 2 and 8 degrees C (36 and 46 degrees F). Do not freeze. Keep in carton to protect from light. Throw away this medicine if it is left out of the refrigerator for more than 48 hours. Throw away any unused medicine after the expiration date. NOTE: This sheet is a summary. It may not cover all possible information. If you have questions about this medicine, talk to your doctor, pharmacist, or health care provider.  2015, Elsevier/Gold Standard. (2014-02-23 16:14:05)  

## 2014-08-07 ENCOUNTER — Encounter (HOSPITAL_COMMUNITY): Payer: Self-pay

## 2014-08-07 ENCOUNTER — Other Ambulatory Visit: Payer: Self-pay | Admitting: Oncology

## 2014-08-07 ENCOUNTER — Ambulatory Visit (HOSPITAL_COMMUNITY)
Admission: RE | Admit: 2014-08-07 | Discharge: 2014-08-07 | Disposition: A | Discharge status: Home / Self Care | Payer: BC Managed Care – PPO | Source: Ambulatory Visit | Attending: Internal Medicine | Admitting: Internal Medicine

## 2014-08-07 VITALS — BP 114/62 | HR 96 | Wt 210.4 lb

## 2014-08-07 DIAGNOSIS — Z170 Estrogen receptor positive status [ER+]: Secondary | ICD-10-CM | POA: Insufficient documentation

## 2014-08-07 DIAGNOSIS — C50411 Malignant neoplasm of upper-outer quadrant of right female breast: Secondary | ICD-10-CM

## 2014-08-07 DIAGNOSIS — C50919 Malignant neoplasm of unspecified site of unspecified female breast: Secondary | ICD-10-CM | POA: Insufficient documentation

## 2014-08-07 DIAGNOSIS — C50419 Malignant neoplasm of upper-outer quadrant of unspecified female breast: Secondary | ICD-10-CM

## 2014-08-07 NOTE — Addendum Note (Signed)
Encounter addended by: Kerry Dory, CMA on: 08/07/2014 12:07 PM<BR>     Documentation filed: Patient Instructions Section, Orders

## 2014-08-07 NOTE — Progress Notes (Signed)
Patient ID: Brandy Jackson, female   DOB: Apr 23, 1951, 63 y.o.   MRN: [id number]  Cardio-oncology Clinic  HPI:  Brandy Jackson is a 63 y.o. woman with no cardiac history referred by Dr. Chancy Milroy to the cardio-oncology clinic. 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 Tumor was ER positive PR positive HER-2/neu positive (triple positive) with a proliferation marker Ki-67 at 90%. She had MRI of the breasts performed which showed a solitary mass in the upper outer quadrant measuring 2.5 cm.  She is being treated withTaxotere carboplatinum to be given every 3 weeks for a total of 6 cycles. Now getting ready for 5/6 cycles. She will also receive Herceptin on a weekly basis for duration of her chemotherapy. Once she finishes chemotherapy we will then switch the Herceptin to every 3 weeks to finish out a total of one year.  Feels well. Denies CP, SOB or edema. BP slightly elevated here but says it is because she is nervous. Has not had problems with HTN.  Echo 04/05/14; EF 60-65% Lat s ' 10.2 GLS -17.1% Echo 07/03/14: EF 65-70% Lat s ' 10.8 GLS -18.8%   PHYSICAL EXAM: Filed Vitals:   08/07/14 1121  BP: 114/62  Pulse: 96  Weight: 210 lb 6.4 oz (95.437 kg)  SpO2: 100%    General:  Well appearing. No respiratory difficulty HEENT: normal Neck: supple. no JVD. Carotids 2+ bilat; no bruits. No lymphadenopathy or thryomegaly appreciated. Cor: PMI nondisplaced. Regular rate & rhythm. No rubs, gallops or murmurs. Lungs: clear Abdomen: soft, nontender, nondistended. No hepatosplenomegaly. No bruits or masses. Good bowel sounds. Extremities: no cyanosis, clubbing, rash, edema Neuro: alert & oriented x 3, cranial nerves grossly intact. moves all 4 extremities w/o difficulty. Affect pleasant.   ASSESSMENT & PLAN:  1. Breast CA -I reviewed echos personally. EF and Doppler parameters stable. No HF on exam. Continue chemo and Herceptin. F/u with echo every 3 months.   Daniel  Bensimhon,MD 12:04 PM

## 2014-08-07 NOTE — Patient Instructions (Signed)
Your physician has requested that you have an echocardiogram. Echocardiography is a painless test that uses sound waves to create images of your heart. It provides your doctor with information about the size and shape of your heart and how well your heart's chambers and valves are working. This procedure takes approximately one hour. There are no restrictions for this procedure.  Your physician recommends that you schedule a follow-up appointment in: 2 months  

## 2014-08-09 ENCOUNTER — Telehealth: Payer: Self-pay | Admitting: Adult Health

## 2014-08-09 NOTE — Telephone Encounter (Signed)
cld pt & left message to adv the MD appt had been moved to coordinated the trmt appt

## 2014-08-10 ENCOUNTER — Other Ambulatory Visit: Payer: Self-pay | Admitting: *Deleted

## 2014-08-10 DIAGNOSIS — C50411 Malignant neoplasm of upper-outer quadrant of right female breast: Secondary | ICD-10-CM

## 2014-08-11 ENCOUNTER — Ambulatory Visit (HOSPITAL_BASED_OUTPATIENT_CLINIC_OR_DEPARTMENT_OTHER): Payer: BC Managed Care – PPO | Admitting: Adult Health

## 2014-08-11 ENCOUNTER — Ambulatory Visit (HOSPITAL_BASED_OUTPATIENT_CLINIC_OR_DEPARTMENT_OTHER): Payer: BC Managed Care – PPO

## 2014-08-11 ENCOUNTER — Other Ambulatory Visit: Payer: BC Managed Care – PPO

## 2014-08-11 ENCOUNTER — Other Ambulatory Visit (HOSPITAL_BASED_OUTPATIENT_CLINIC_OR_DEPARTMENT_OTHER): Payer: BC Managed Care – PPO

## 2014-08-11 ENCOUNTER — Encounter: Payer: Self-pay | Admitting: Adult Health

## 2014-08-11 ENCOUNTER — Other Ambulatory Visit: Payer: Self-pay | Admitting: Oncology

## 2014-08-11 ENCOUNTER — Ambulatory Visit: Payer: BC Managed Care – PPO | Admitting: Nurse Practitioner

## 2014-08-11 VITALS — BP 151/52 | HR 92 | Temp 98.4°F | Resp 18 | Ht 67.0 in | Wt 208.3 lb

## 2014-08-11 VITALS — BP 150/59 | HR 95 | Temp 98.4°F | Resp 18

## 2014-08-11 DIAGNOSIS — C50411 Malignant neoplasm of upper-outer quadrant of right female breast: Secondary | ICD-10-CM

## 2014-08-11 DIAGNOSIS — R5381 Other malaise: Secondary | ICD-10-CM

## 2014-08-11 DIAGNOSIS — D6481 Anemia due to antineoplastic chemotherapy: Secondary | ICD-10-CM

## 2014-08-11 DIAGNOSIS — T451X5A Adverse effect of antineoplastic and immunosuppressive drugs, initial encounter: Secondary | ICD-10-CM

## 2014-08-11 DIAGNOSIS — Z5112 Encounter for antineoplastic immunotherapy: Secondary | ICD-10-CM

## 2014-08-11 DIAGNOSIS — Z170 Estrogen receptor positive status [ER+]: Secondary | ICD-10-CM

## 2014-08-11 DIAGNOSIS — C50419 Malignant neoplasm of upper-outer quadrant of unspecified female breast: Secondary | ICD-10-CM

## 2014-08-11 DIAGNOSIS — R5383 Other fatigue: Secondary | ICD-10-CM

## 2014-08-11 LAB — COMPREHENSIVE METABOLIC PANEL (CC13)
ALT: 23 U/L (ref 0–55)
ANION GAP: 11 meq/L (ref 3–11)
AST: 21 U/L (ref 5–34)
Albumin: 3.8 g/dL (ref 3.5–5.0)
Alkaline Phosphatase: 119 U/L (ref 40–150)
BUN: 14.6 mg/dL (ref 7.0–26.0)
CALCIUM: 9.5 mg/dL (ref 8.4–10.4)
CO2: 24 meq/L (ref 22–29)
CREATININE: 0.7 mg/dL (ref 0.6–1.1)
Chloride: 108 mEq/L (ref 98–109)
GLUCOSE: 205 mg/dL — AB (ref 70–140)
Potassium: 3.9 mEq/L (ref 3.5–5.1)
Sodium: 142 mEq/L (ref 136–145)
Total Bilirubin: <0.2 mg/dL (ref 0.20–1.20)
Total Protein: 6.9 g/dL (ref 6.4–8.3)

## 2014-08-11 LAB — CBC WITH DIFFERENTIAL/PLATELET
BASO%: 0.2 % (ref 0.0–2.0)
Basophils Absolute: 0 10*3/uL (ref 0.0–0.1)
EOS ABS: 0 10*3/uL (ref 0.0–0.5)
EOS%: 0.1 % (ref 0.0–7.0)
HCT: 33.7 % — ABNORMAL LOW (ref 34.8–46.6)
HGB: 11 g/dL — ABNORMAL LOW (ref 11.6–15.9)
LYMPH#: 2.2 10*3/uL (ref 0.9–3.3)
LYMPH%: 18.3 % (ref 14.0–49.7)
MCH: 27.2 pg (ref 25.1–34.0)
MCHC: 32.6 g/dL (ref 31.5–36.0)
MCV: 83.2 fL (ref 79.5–101.0)
MONO#: 1.3 10*3/uL — AB (ref 0.1–0.9)
MONO%: 10.9 % (ref 0.0–14.0)
NEUT%: 70.5 % (ref 38.4–76.8)
NEUTROS ABS: 8.6 10*3/uL — AB (ref 1.5–6.5)
Platelets: 206 10*3/uL (ref 145–400)
RBC: 4.05 10*6/uL (ref 3.70–5.45)
RDW: 16.2 % — ABNORMAL HIGH (ref 11.2–14.5)
WBC: 12.2 10*3/uL — ABNORMAL HIGH (ref 3.9–10.3)

## 2014-08-11 MED ORDER — DIPHENHYDRAMINE HCL 25 MG PO CAPS
25.0000 mg | ORAL_CAPSULE | Freq: Once | ORAL | Status: AC
  Administered 2014-08-11: 25 mg via ORAL

## 2014-08-11 MED ORDER — SODIUM CHLORIDE 0.9 % IJ SOLN
10.0000 mL | INTRAMUSCULAR | Status: DC | PRN
  Administered 2014-08-11: 10 mL
  Filled 2014-08-11: qty 10

## 2014-08-11 MED ORDER — ACETAMINOPHEN 325 MG PO TABS
650.0000 mg | ORAL_TABLET | Freq: Once | ORAL | Status: AC
  Administered 2014-08-11: 650 mg via ORAL

## 2014-08-11 MED ORDER — DIPHENHYDRAMINE HCL 25 MG PO CAPS
ORAL_CAPSULE | ORAL | Status: AC
  Filled 2014-08-11: qty 1

## 2014-08-11 MED ORDER — SODIUM CHLORIDE 0.9 % IV SOLN
Freq: Once | INTRAVENOUS | Status: AC
  Administered 2014-08-11: 15:00:00 via INTRAVENOUS

## 2014-08-11 MED ORDER — ACETAMINOPHEN 325 MG PO TABS
ORAL_TABLET | ORAL | Status: AC
  Filled 2014-08-11: qty 2

## 2014-08-11 MED ORDER — HEPARIN SOD (PORK) LOCK FLUSH 100 UNIT/ML IV SOLN
500.0000 [IU] | Freq: Once | INTRAVENOUS | Status: AC | PRN
  Administered 2014-08-11: 500 [IU]
  Filled 2014-08-11: qty 5

## 2014-08-11 MED ORDER — TRASTUZUMAB CHEMO INJECTION 440 MG
2.0000 mg/kg | Freq: Once | INTRAVENOUS | Status: AC
  Administered 2014-08-11: 189 mg via INTRAVENOUS
  Filled 2014-08-11: qty 9

## 2014-08-11 NOTE — Patient Instructions (Signed)
[facility] Discharge Instructions for Patients Receiving Chemotherapy  Today you received the following chemotherapy agents :  Herceptin.  To help prevent nausea and vomiting after your treatment, we encourage you to take your nausea medication as instructed by your physician.   If you develop nausea and vomiting that is not controlled by your nausea medication, call the clinic.   BELOW ARE SYMPTOMS THAT SHOULD BE REPORTED IMMEDIATELY:  *FEVER GREATER THAN 100.5 F  *CHILLS WITH OR WITHOUT FEVER  NAUSEA AND VOMITING THAT IS NOT CONTROLLED WITH YOUR NAUSEA MEDICATION  *UNUSUAL SHORTNESS OF BREATH  *UNUSUAL BRUISING OR BLEEDING  TENDERNESS IN MOUTH AND THROAT WITH OR WITHOUT PRESENCE OF ULCERS  *URINARY PROBLEMS  *BOWEL PROBLEMS  UNUSUAL RASH Items with * indicate a potential emergency and should be followed up as soon as possible.  Feel free to call the clinic you have any questions or concerns. The clinic phone number is [phone].    

## 2014-08-11 NOTE — Progress Notes (Signed)
ID: [name] OB: [birth year]  MR#: [id number]  AYT#:[id number]  PCP: Ricke Hey, MD GYN:   SU: Dr. Brantley Stage OTHER MD:  CHIEF COMPLAINT:  Right-sided breast cancer  CURRENT THERAPY:receiving adjuvant chemotherapy  BREAST CANCER HISTORY: From the original consult note:  "Brandy Jackson is a 63 y.o. female. Patient palpated a right breast mass. She had a mammogram performed and she was noted to have a mass in the upper outer quadrant measuring 2.1 x 2.0 x 1.7 cm. 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 Tumor was ER positive PR positive HER-2/neu positive (triple positive) with a proliferation marker Ki-67 at 90%. She had MRI of the breasts performed which showed a solitary mass in the upper outer quadrant measuring 2.5 cm. Her case was discussed at the multidisciplinary breast conference."  Her subsequent treatment is as detailed below.   INTERVAL HISTORY:   Brandy Jackson returns today accompanied her husband for a followup visit. This is day 8 cycle 4 of 6 planned cycles of carboplatin, docetaxel, and trastuzumab, given with Neulasta on day 2.  Caliyah is doing well today.  She does have some nasal drainage and cough that started this week.  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  She did have some back pain from the Neulasta.  She does have numbness and tingling in her toes that is intermittent.  She does have stiffness in her toes and right foot.  Her diarrhea has improved with the Questran.  She continues to be increasingly fatigued after her treatment and is having difficulty with working in a child care facility.    REVIEW OF SYSTEMS:  A 10 point review of systems was conducted and is otherwise negative except for what is noted above.    PAST MEDICAL HISTORY: Past Medical History  Diagnosis Date  . Diabetes mellitus without complication   .  Arthritis   . Hot flashes   . Cancer   . Breast cancer   . Wears glasses   . Wears partial dentures     bottom    PAST SURGICAL HISTORY: Past Surgical History  Procedure Laterality Date  . Bladder repair w/ cesarean section    . Colonoscopy    . Portacath placement N/A 04/11/2014    Procedure: INSERTION PORT-A-CATH;  Surgeon: Joyice Faster. Cornett, MD;  Location: [facility];  Service: General;  Laterality: N/A;  . Re-excision of breast lumpectomy Right 06/06/2014    Procedure: RE-EXCISION OF BREAST LUMPECTOMY;  Surgeon: Marcello Moores A. Cornett, MD;  Location: [facility];  Service: General;  Laterality: Right;    FAMILY HISTORY No family history on file.  GYNECOLOGIC HISTORY: menarche at age 33, G59 P3, went through menopause at age 31.  No h/o of HRT, no h/o abnormal pap smears, or sexually transmitted infections.  Does have frequent vaginal candida infections due to diabetes that she follows with her gynecologist for.    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 Her husband [name] is retired from [employer]. He is a cancer survivor himself. The patient has 3 sons, one of whom is studying to be a pediatrician. All her children are married. She has 6 grandchildren.    ADVANCED DIRECTIVES: not in place.    HEALTH MAINTENANCE: History  Substance  Use Topics  . Smoking status: Former Smoker -- 1.00 packs/day    Quit date: 03/08/1986  . Smokeless tobacco: Never Used  . Alcohol Use: No      Mammogram: Colonoscopy: Bone Density Scan:  Pap Smear:  Eye Exam:  Vitamin D Level:   Lipid Panel:    Allergies  Allergen Reactions  . Sulfa Antibiotics Rash    Current Outpatient Prescriptions  Medication Sig Dispense Refill  . cholestyramine (QUESTRAN) 4 G packet Take 1 packet (4 g total) by mouth 2 (two) times daily.  60 each  12  . dexamethasone (DECADRON) 4 MG tablet Take 2 tablets  (8 mg total) by mouth 2 (two) times daily. Take the day before chemo, then the day after chemo x 3 days.  30 tablet  2  . lidocaine-prilocaine (EMLA) cream Apply topically as needed.  30 g  1  . Loperamide HCl (IMODIUM PO) Take 1 tablet by mouth.      . metFORMIN (GLUCOPHAGE) 500 MG tablet Take 500 mg by mouth 2 (two) times daily with a meal.      . ondansetron (ZOFRAN) 8 MG tablet Take 1 tablet (8 mg total) by mouth 2 (two) times daily. Take starting the day after chemo for three days, then take twice a day PRN.  20 tablet  1  . prochlorperazine (COMPAZINE) 10 MG tablet Take 1 tablet (10 mg total) by mouth every 6 (six) hours as needed for nausea or vomiting.  30 tablet  0  . LORazepam (ATIVAN) 0.5 MG tablet Take 1 tablet (0.5 mg total) by mouth every 8 (eight) hours.  30 tablet  0  . oxyCODONE-acetaminophen (ROXICET) 5-325 MG per tablet Take 1 tablet by mouth every 4 (four) hours as needed.  30 tablet  0   No current facility-administered medications for this visit.    OBJECTIVE: Middle-aged [place] American woman who appears stated age 69 Vitals:   08/11/14 1355  BP: 151/52  Pulse: 92  Temp: 98.4 F (36.9 C)  Resp: 18     Body mass index is 32.62 kg/(m^2).     GENERAL: Patient is a well appearing female in no acute distress HEENT:  Sclerae anicteric.  Oropharynx clear and moist. No ulcerations or evidence of oropharyngeal candidiasis. Neck is supple.  NODES:  No cervical, supraclavicular, or axillary lymphadenopathy palpated.  BREAST EXAM:  Deferred. LUNGS:  Clear to auscultation bilaterally.  No wheezes or rhonchi. HEART:  Regular rate and rhythm. No murmur appreciated. ABDOMEN:  Soft, nontender.  Positive, normoactive bowel sounds. No organomegaly palpated. MSK:  No focal spinal tenderness to palpation. Full range of motion bilaterally in the upper extremities. EXTREMITIES:  No peripheral edema.   SKIN:  Clear with no obvious rashes or skin changes. No nail dyscrasia. NEURO:   Nonfocal. Well oriented.  Appropriate affect. ECOG FS:2    LAB RESULTS:  CMP     Component Value Date/Time   NA 142 08/11/2014 1320   NA 142 04/07/2014 1320   K 3.9 08/11/2014 1320   K 4.2 04/07/2014 1320   CL 103 04/07/2014 1320   CO2 24 08/11/2014 1320   CO2 25 04/07/2014 1320   GLUCOSE 205* 08/11/2014 1320   GLUCOSE 126* 04/07/2014 1320   BUN 14.6 08/11/2014 1320   BUN 16 04/07/2014 1320   CREATININE 0.7 08/11/2014 1320   CREATININE 0.56 04/07/2014 1320   CALCIUM 9.5 08/11/2014 1320   CALCIUM 9.4 04/07/2014 1320   PROT 6.9 08/11/2014 1320   PROT  7.8 04/07/2014 1320   ALBUMIN 3.8 08/11/2014 1320   ALBUMIN 3.9 04/07/2014 1320   AST 21 08/11/2014 1320   AST 20 04/07/2014 1320   ALT 23 08/11/2014 1320   ALT 19 04/07/2014 1320   ALKPHOS 119 08/11/2014 1320   ALKPHOS 68 04/07/2014 1320   BILITOT <0.20 08/11/2014 1320   BILITOT 0.2* 04/07/2014 1320   GFRNONAA >90 04/07/2014 1320   GFRAA >90 04/07/2014 1320    I No results found for this basename: SPEP,  UPEP,   kappa and lambda light chains    Lab Results  Component Value Date   WBC 12.2* 08/11/2014   NEUTROABS 8.6* 08/11/2014   HGB 11.0* 08/11/2014   HCT 33.7* 08/11/2014   MCV 83.2 08/11/2014   PLT 206 08/11/2014      Chemistry      Component Value Date/Time   NA 142 08/11/2014 1320   NA 142 04/07/2014 1320   K 3.9 08/11/2014 1320   K 4.2 04/07/2014 1320   CL 103 04/07/2014 1320   CO2 24 08/11/2014 1320   CO2 25 04/07/2014 1320   BUN 14.6 08/11/2014 1320   BUN 16 04/07/2014 1320   CREATININE 0.7 08/11/2014 1320   CREATININE 0.56 04/07/2014 1320      Component Value Date/Time   CALCIUM 9.5 08/11/2014 1320   CALCIUM 9.4 04/07/2014 1320   ALKPHOS 119 08/11/2014 1320   ALKPHOS 68 04/07/2014 1320   AST 21 08/11/2014 1320   AST 20 04/07/2014 1320   ALT 23 08/11/2014 1320   ALT 19 04/07/2014 1320   BILITOT <0.20 08/11/2014 1320   BILITOT 0.2* 04/07/2014 1320       No results found for this basename: LABCA2    No components found with this basename: LABCA125    No results found for this basename:  INR,  in the last 168 hours  Urinalysis No results found for this basename: colorurine,  appearanceur,  labspec,  phurine,  glucoseu,  hgbur,  bilirubinur,  ketonesur,  proteinur,  urobilinogen,  nitrite,  leukocytesur    STUDIES: No results found.  ASSESSMENT: 63 y.o. [place],woman status post right lumpectomy and sentinel lymph node sampling 04/11/2014 for a pT2 pN1, stage IIB invasive ductal carcinoma, grade 3, estrogen receptor strongly positive, progesterone receptor negative, with an MIB-1 of 94% and HER-2 amplification  (1) additional surgery in 06/06/2014 for margin clearance showed no evidence of residual malignancy  (2) received the first of six planned cycles of adjuvant chemotherapy with carboplatin docetaxel and trastuzumab 05/02/2014, with chemotherapy interrupted because of her intervening surgery; chemo to be resumed 06/26/2014  (3) continuing trastuzumab through May 2016 to complete 1 year, most recent echocardiogram 04/05/2014 showed a normal ejection fraction  (4) the patient will need adjuvant radiation at the completion of chemotherapy  (5) she will start antiestrogen therapy at the completion of radiation  PLAN:  Fumiye is doing well today.  I reviewed her lab work with her in detail. She continues to have a slight treatment related anemia that we will continue to monitor.    Her blood glucose is 205 today.  She continues to take Metformin and her diarrhea is controlled with Questran and she is taking that regularly.    Tikesha underwent an echocardiogram on 07/03/14, her LVEF was 60-65%.  She had f/u with Dr. Haroldine Laws and was cleared to continue Trastuzumab therapy.    Due to her increased fatigue, and sleeping excessively, and feeling poorly at work, Pamala Hurry and I  both agree that she should take some more time off of work until she can feel better.    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  We will monitor it closely.  Should it worsen  changing therapy may need to be considered as it is a side effect of the Docetaxel.    Varnika has a good understanding of the overall plan. She agrees with it. She knows the goal of treatment in her case is cure. She will call with any problems that may develop before her next visit here.  I spent 25 minutes counseling the patient face to face.  The total time spent in the appointment was 30 minutes.  Minette Headland, [place] [phone] 08/13/2014 9:24 AM

## 2014-08-16 ENCOUNTER — Telehealth: Payer: Self-pay | Admitting: Oncology

## 2014-08-16 NOTE — Telephone Encounter (Signed)
lvm for pt regarding to Sept appt.... °

## 2014-08-17 ENCOUNTER — Other Ambulatory Visit: Payer: Self-pay | Admitting: *Deleted

## 2014-08-17 DIAGNOSIS — C50411 Malignant neoplasm of upper-outer quadrant of right female breast: Secondary | ICD-10-CM

## 2014-08-18 ENCOUNTER — Ambulatory Visit (HOSPITAL_BASED_OUTPATIENT_CLINIC_OR_DEPARTMENT_OTHER): Payer: BC Managed Care – PPO

## 2014-08-18 ENCOUNTER — Encounter: Payer: Self-pay | Admitting: Oncology

## 2014-08-18 ENCOUNTER — Other Ambulatory Visit (HOSPITAL_BASED_OUTPATIENT_CLINIC_OR_DEPARTMENT_OTHER): Payer: BC Managed Care – PPO

## 2014-08-18 ENCOUNTER — Ambulatory Visit (HOSPITAL_COMMUNITY)
Admission: RE | Admit: 2014-08-18 | Discharge: 2014-08-18 | Disposition: A | Discharge status: Home / Self Care | Payer: BC Managed Care – PPO | Source: Ambulatory Visit | Attending: Nurse Practitioner | Admitting: Nurse Practitioner

## 2014-08-18 ENCOUNTER — Encounter: Payer: Self-pay | Admitting: Nurse Practitioner

## 2014-08-18 ENCOUNTER — Other Ambulatory Visit: Payer: Self-pay | Admitting: Oncology

## 2014-08-18 ENCOUNTER — Ambulatory Visit (HOSPITAL_BASED_OUTPATIENT_CLINIC_OR_DEPARTMENT_OTHER): Payer: BC Managed Care – PPO | Admitting: Nurse Practitioner

## 2014-08-18 VITALS — BP 145/70 | HR 87 | Temp 98.2°F | Resp 19 | Ht 67.0 in | Wt 212.7 lb

## 2014-08-18 DIAGNOSIS — T82598A Other mechanical complication of other cardiac and vascular devices and implants, initial encounter: Secondary | ICD-10-CM | POA: Diagnosis present

## 2014-08-18 DIAGNOSIS — C50411 Malignant neoplasm of upper-outer quadrant of right female breast: Secondary | ICD-10-CM

## 2014-08-18 DIAGNOSIS — C50419 Malignant neoplasm of upper-outer quadrant of unspecified female breast: Secondary | ICD-10-CM

## 2014-08-18 DIAGNOSIS — R5383 Other fatigue: Secondary | ICD-10-CM

## 2014-08-18 DIAGNOSIS — Y849 Medical procedure, unspecified as the cause of abnormal reaction of the patient, or of later complication, without mention of misadventure at the time of the procedure: Secondary | ICD-10-CM | POA: Diagnosis not present

## 2014-08-18 DIAGNOSIS — J040 Acute laryngitis: Secondary | ICD-10-CM | POA: Insufficient documentation

## 2014-08-18 DIAGNOSIS — R609 Edema, unspecified: Secondary | ICD-10-CM

## 2014-08-18 DIAGNOSIS — Z170 Estrogen receptor positive status [ER+]: Secondary | ICD-10-CM

## 2014-08-18 DIAGNOSIS — T85618A Breakdown (mechanical) of other specified internal prosthetic devices, implants and grafts, initial encounter: Secondary | ICD-10-CM

## 2014-08-18 DIAGNOSIS — T85698A Other mechanical complication of other specified internal prosthetic devices, implants and grafts, initial encounter: Secondary | ICD-10-CM

## 2014-08-18 DIAGNOSIS — E119 Type 2 diabetes mellitus without complications: Secondary | ICD-10-CM

## 2014-08-18 HISTORY — DX: Acute laryngitis: J04.0

## 2014-08-18 LAB — CBC WITH DIFFERENTIAL/PLATELET
BASO%: 0.5 % (ref 0.0–2.0)
Basophils Absolute: 0 10*3/uL (ref 0.0–0.1)
EOS%: 0.2 % (ref 0.0–7.0)
Eosinophils Absolute: 0 10*3/uL (ref 0.0–0.5)
HCT: 33.2 % — ABNORMAL LOW (ref 34.8–46.6)
HGB: 10.5 g/dL — ABNORMAL LOW (ref 11.6–15.9)
LYMPH%: 21.1 % (ref 14.0–49.7)
MCH: 26.6 pg (ref 25.1–34.0)
MCHC: 31.7 g/dL (ref 31.5–36.0)
MCV: 84 fL (ref 79.5–101.0)
MONO#: 0.5 10*3/uL (ref 0.1–0.9)
MONO%: 6.9 % (ref 0.0–14.0)
NEUT#: 5.2 10*3/uL (ref 1.5–6.5)
NEUT%: 71.3 % (ref 38.4–76.8)
Platelets: 245 10*3/uL (ref 145–400)
RBC: 3.95 10*6/uL (ref 3.70–5.45)
RDW: 17.5 % — ABNORMAL HIGH (ref 11.2–14.5)
WBC: 7.3 10*3/uL (ref 3.9–10.3)
lymph#: 1.6 10*3/uL (ref 0.9–3.3)

## 2014-08-18 LAB — COMPREHENSIVE METABOLIC PANEL (CC13)
ALBUMIN: 3.4 g/dL — AB (ref 3.5–5.0)
ALT: 19 U/L (ref 0–55)
AST: 22 U/L (ref 5–34)
Alkaline Phosphatase: 83 U/L (ref 40–150)
Anion Gap: 9 mEq/L (ref 3–11)
BUN: 10 mg/dL (ref 7.0–26.0)
CO2: 24 mEq/L (ref 22–29)
Calcium: 8.7 mg/dL (ref 8.4–10.4)
Chloride: 109 mEq/L (ref 98–109)
Creatinine: 0.7 mg/dL (ref 0.6–1.1)
Glucose: 191 mg/dl — ABNORMAL HIGH (ref 70–140)
POTASSIUM: 3.8 meq/L (ref 3.5–5.1)
Sodium: 142 mEq/L (ref 136–145)
TOTAL PROTEIN: 6.4 g/dL (ref 6.4–8.3)
Total Bilirubin: <0.2 mg/dL (ref 0.20–1.20)

## 2014-08-18 MED ORDER — METFORMIN HCL 500 MG PO TABS
1000.0000 mg | ORAL_TABLET | Freq: Two times a day (BID) | ORAL | Status: DC
Start: 2014-08-18 — End: 2014-09-15

## 2014-08-18 MED ORDER — SODIUM CHLORIDE 0.9 % IJ SOLN
10.0000 mL | INTRAMUSCULAR | Status: DC | PRN
  Administered 2014-08-18: 10 mL
  Filled 2014-08-18: qty 10

## 2014-08-18 MED ORDER — IOHEXOL 300 MG/ML  SOLN
50.0000 mL | Freq: Once | INTRAMUSCULAR | Status: AC | PRN
  Administered 2014-08-18: 8 mL via INTRAVENOUS

## 2014-08-18 NOTE — Progress Notes (Signed)
Port a cath wil not aspirate but will flush . However during saline flush the skin area above port diaphragm ballooned out. Huber needle removed.Brandy Jackson in to see pt.

## 2014-08-18 NOTE — Procedures (Signed)
SUCCESSFUL INJECTION OF THE RT SUBCLAVIAN PORT NO COMP STABLE FULL REPORT IN PACS

## 2014-08-18 NOTE — Progress Notes (Signed)
Faxed fmla form to [employer] @ [phone] and disability paper to [employer] @ [phone]

## 2014-08-18 NOTE — Progress Notes (Signed)
ID: Brandy Jackson OB: 09-18-1951  MR#: [id number]  QDI#:[id number]  PCP: Ricke Hey, MD GYN:   SU: Dr. Brantley Stage OTHER MD:  CHIEF COMPLAINT:  Right-sided breast cancer  CURRENT THERAPY:receiving adjuvant chemotherapy  BREAST CANCER HISTORY: From the original consult note:  "Brandy Jackson is a 63 y.o. female. Patient palpated a right breast mass. She had a mammogram performed and she was noted to have a mass in the upper outer quadrant measuring 2.1 x 2.0 x 1.7 cm. 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 Tumor was ER positive PR positive HER-2/neu positive (triple positive) with a proliferation marker Ki-67 at 90%. She had MRI of the breasts performed which showed a solitary mass in the upper outer quadrant measuring 2.5 cm. Her case was discussed at the multidisciplinary breast conference."  Her subsequent treatment is as detailed below.   INTERVAL HISTORY:   Brandy Jackson returns today accompanied her husband for follow up of her breast cancer. Today is day 15, cycle 4 of 6 planned cycles of carboplatin and docetaxel, given with neulasta on day 2, and herceptin given weekly. Her voice is hoarse and she is barely audible. 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 She has been advised in the past about how her nature of work could leave her susceptible to infections because she is currently on chemo. She understands now and is requesting FMLA status.   REVIEW OF SYSTEMS:  Brandy Jackson denies fevers, chills, nausea, or vomiting. She continues to be increasingly fatigued. She is no longer having diarrhea. She denies shortness of breath or chest pain. She is coughing now because of her upper respiratory infection, but this is only occasional and she experiences no phlegm. Her bilateral toes are occasionally numb, but this is fleeting and unchanged. A detailed review of systems is otherwise  negative.     PAST MEDICAL HISTORY: Past Medical History  Diagnosis Date  . Diabetes mellitus without complication   . Arthritis   . Hot flashes   . Cancer   . Breast cancer   . Wears glasses   . Wears partial dentures     bottom    PAST SURGICAL HISTORY: Past Surgical History  Procedure Laterality Date  . Bladder repair w/ cesarean section    . Colonoscopy    . Portacath placement N/A 04/11/2014    Procedure: INSERTION PORT-A-CATH;  Surgeon: Joyice Faster. Cornett, MD;  Location: [facility];  Service: General;  Laterality: N/A;  . Re-excision of breast lumpectomy Right 06/06/2014    Procedure: RE-EXCISION OF BREAST LUMPECTOMY;  Surgeon: Marcello Moores A. Cornett, MD;  Location: [facility];  Service: General;  Laterality: Right;    FAMILY HISTORY No family history on file.  GYNECOLOGIC HISTORY: menarche at age 47, G39 P3, went through menopause at age 38.  No h/o of HRT, no h/o abnormal pap smears, or sexually transmitted infections.  Does have frequent vaginal candida infections due to diabetes that she follows with her gynecologist for.    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 Her husband [name] is retired from [employer]. He is a cancer survivor himself. The patient has 3 sons, one of whom is studying to be a pediatrician. All her children are married. She has 6 grandchildren.    ADVANCED DIRECTIVES: not in place.  HEALTH MAINTENANCE: History  Substance Use Topics  . Smoking status: Former Smoker -- 1.00 packs/day    Quit date: 03/08/1986  . Smokeless tobacco: Never Used  . Alcohol Use: No      Mammogram: Colonoscopy: Bone Density Scan:  Pap Smear:  Eye Exam:  Vitamin D Level:   Lipid Panel:    Allergies  Allergen Reactions  . Sulfa Antibiotics Rash    Current Outpatient Prescriptions  Medication Sig Dispense Refill  . dexamethasone (DECADRON) 4 MG tablet  Take 2 tablets (8 mg total) by mouth 2 (two) times daily. Take the day before chemo, then the day after chemo x 3 days.  30 tablet  2  . lidocaine-prilocaine (EMLA) cream Apply topically as needed.  30 g  1  . LORazepam (ATIVAN) 0.5 MG tablet Take 1 tablet (0.5 mg total) by mouth every 8 (eight) hours.  30 tablet  0  . metFORMIN (GLUCOPHAGE) 500 MG tablet Take 500 mg by mouth 2 (two) times daily with a meal.      . ondansetron (ZOFRAN) 8 MG tablet Take 1 tablet (8 mg total) by mouth 2 (two) times daily. Take starting the day after chemo for three days, then take twice a day PRN.  20 tablet  1  . prochlorperazine (COMPAZINE) 10 MG tablet Take 1 tablet (10 mg total) by mouth every 6 (six) hours as needed for nausea or vomiting.  30 tablet  0  . cholestyramine (QUESTRAN) 4 G packet Take 1 packet (4 g total) by mouth 2 (two) times daily.  60 each  12  . Loperamide HCl (IMODIUM PO) Take 1 tablet by mouth.      . oxyCODONE-acetaminophen (ROXICET) 5-325 MG per tablet Take 1 tablet by mouth every 4 (four) hours as needed.  30 tablet  0   No current facility-administered medications for this visit.    OBJECTIVE: Middle-aged [place] American woman who appears stated age 69 Vitals:   08/18/14 1145  BP: 145/70  Pulse: 87  Temp: 98.2 F (36.8 C)  Resp: 19     Body mass index is 33.31 kg/(m^2).     Skin: warm, dry  HEENT: sclerae anicteric, conjunctivae pink, oropharynx clear. No thrush or mucositis. Voice is hoarse, mouth is dry.  Lymph Nodes: No cervical or supraclavicular lymphadenopathy  Lungs: clear to auscultation bilaterally, no rales, wheezes, or rhonci  Heart: regular rate and rhythm  Abdomen: round, soft, non tender, positive bowel sounds  Musculoskeletal: No focal spinal tenderness, no peripheral edema  Neuro: non focal, well oriented, positive affect  Breasts: deferred  ECOG FS:1  LAB RESULTS:  CMP     Component Value Date/Time   NA 142 08/18/2014 1049   NA 142 04/07/2014 1320    K 3.8 08/18/2014 1049   K 4.2 04/07/2014 1320   CL 103 04/07/2014 1320   CO2 24 08/18/2014 1049   CO2 25 04/07/2014 1320   GLUCOSE 191* 08/18/2014 1049   GLUCOSE 126* 04/07/2014 1320   BUN 10.0 08/18/2014 1049   BUN 16 04/07/2014 1320   CREATININE 0.7 08/18/2014 1049   CREATININE 0.56 04/07/2014 1320   CALCIUM 8.7 08/18/2014 1049   CALCIUM 9.4 04/07/2014 1320   PROT 6.4 08/18/2014 1049   PROT 7.8 04/07/2014 1320   ALBUMIN 3.4* 08/18/2014 1049   ALBUMIN 3.9 04/07/2014 1320   AST 22 08/18/2014 1049   AST 20 04/07/2014 1320   ALT 19 08/18/2014 1049   ALT 19 04/07/2014 1320   ALKPHOS 83  08/18/2014 1049   ALKPHOS 68 04/07/2014 1320   BILITOT <0.20 08/18/2014 1049   BILITOT 0.2* 04/07/2014 1320   GFRNONAA >90 04/07/2014 1320   GFRAA >90 04/07/2014 1320    I No results found for this basename: SPEP,  UPEP,   kappa and lambda light chains    Lab Results  Component Value Date   WBC 7.3 08/18/2014   NEUTROABS 5.2 08/18/2014   HGB 10.5* 08/18/2014   HCT 33.2* 08/18/2014   MCV 84.0 08/18/2014   PLT 245 08/18/2014      Chemistry      Component Value Date/Time   NA 142 08/18/2014 1049   NA 142 04/07/2014 1320   K 3.8 08/18/2014 1049   K 4.2 04/07/2014 1320   CL 103 04/07/2014 1320   CO2 24 08/18/2014 1049   CO2 25 04/07/2014 1320   BUN 10.0 08/18/2014 1049   BUN 16 04/07/2014 1320   CREATININE 0.7 08/18/2014 1049   CREATININE 0.56 04/07/2014 1320      Component Value Date/Time   CALCIUM 8.7 08/18/2014 1049   CALCIUM 9.4 04/07/2014 1320   ALKPHOS 83 08/18/2014 1049   ALKPHOS 68 04/07/2014 1320   AST 22 08/18/2014 1049   AST 20 04/07/2014 1320   ALT 19 08/18/2014 1049   ALT 19 04/07/2014 1320   BILITOT <0.20 08/18/2014 1049   BILITOT 0.2* 04/07/2014 1320       No results found for this basename: LABCA2    No components found with this basename: LABCA125    No results found for this basename: INR,  in the last 168 hours  Urinalysis No results found for this basename: colorurine,  appearanceur,  labspec,  phurine,  glucoseu,   hgbur,  bilirubinur,  ketonesur,  proteinur,  urobilinogen,  nitrite,  leukocytesur    STUDIES: No results found.  ASSESSMENT: 63 y.o. [place],woman status post right lumpectomy and sentinel lymph node sampling 04/11/2014 for a pT2 pN1, stage IIB invasive ductal carcinoma, grade 3, estrogen receptor strongly positive, progesterone receptor negative, with an MIB-1 of 94% and HER-2 amplification  (1) additional surgery in 06/06/2014 for margin clearance showed no evidence of residual malignancy  (2) received the first of six planned cycles of adjuvant chemotherapy with carboplatin docetaxel and trastuzumab 05/02/2014, with chemotherapy interrupted because of her intervening surgery; chemo to be resumed 06/26/2014  (3) continuing trastuzumab through May 2016 to complete 1 year, most recent echocardiogram 04/05/2014 showed a normal ejection fraction  (4) the patient will need adjuvant radiation at the completion of chemotherapy  (5) she will start antiestrogen therapy at the completion of radiation  PLAN:  Besides her acute laryngitis, Jasmine is doing fine. This is likely viral in nature so we will treat symptomatically with increasing PO fluids, warm tea with lemon, and rest. She has given into the idea of taking time off work, and her FMLA was approved to begin Monday, through October 31st. By this time she should be done with chemotherapy and be on maintenance herceptin alone.  The labs were reviewed in detail with her, and again her sugars were elevated. She states she would like to try the increased dose of metformin (1029m BID) again so I refilled her prescription in this manner.The first time she tried, she had GI upset. I also encouraged a low carb, no concentrated sweets diet. The rest of the labs were stable.   We will proceed with the herceptin today. She will return for labs and an office visit,  prior to the start of day 1, cycle 5 of docetaxel and carboplatin with weekly  herceptin. Riannah understands and agrees with this plan. She has been encouraged to call with any issues that might arise before her next visit here.    Marcelino Duster, [place] [phone] 08/18/2014 12:51 PM

## 2014-08-21 ENCOUNTER — Telehealth: Payer: Self-pay | Admitting: *Deleted

## 2014-08-21 NOTE — Telephone Encounter (Signed)
VERBAL ORDER AND READ BACK TO CINDEE BACON,NP- PER DR.MAGRINAT- PT. WILL RECEIVE THE HERCEPTIN AT HER SCHEDULED APPOINTMENT ON 08/25/14. NOTIFIED PT. THAT A SCHEDULER WILL CALL WITH AN ADJUSTED TREATMENT TIME FOR 08/25/14. SHE VOICES UNDERSTANDING.

## 2014-08-22 ENCOUNTER — Encounter: Payer: Self-pay | Admitting: Oncology

## 2014-08-22 NOTE — Progress Notes (Signed)
Faxed clinical information to [facility] [employer] @ [phone]

## 2014-08-23 ENCOUNTER — Telehealth: Payer: Self-pay | Admitting: Nurse Practitioner

## 2014-08-23 NOTE — Telephone Encounter (Signed)
Lvm advising appt chg on 9/25 from HF to [facility] due to HF out of office. Appt date/time remains unchanged.

## 2014-08-24 ENCOUNTER — Other Ambulatory Visit: Payer: Self-pay | Admitting: Nurse Practitioner

## 2014-08-24 DIAGNOSIS — C50411 Malignant neoplasm of upper-outer quadrant of right female breast: Secondary | ICD-10-CM

## 2014-08-25 ENCOUNTER — Encounter: Payer: Self-pay | Admitting: Nurse Practitioner

## 2014-08-25 ENCOUNTER — Ambulatory Visit (HOSPITAL_BASED_OUTPATIENT_CLINIC_OR_DEPARTMENT_OTHER): Payer: BC Managed Care – PPO | Admitting: Nurse Practitioner

## 2014-08-25 ENCOUNTER — Other Ambulatory Visit: Payer: Self-pay | Admitting: Hematology and Oncology

## 2014-08-25 ENCOUNTER — Other Ambulatory Visit (HOSPITAL_BASED_OUTPATIENT_CLINIC_OR_DEPARTMENT_OTHER): Payer: BC Managed Care – PPO

## 2014-08-25 ENCOUNTER — Ambulatory Visit: Payer: BC Managed Care – PPO

## 2014-08-25 ENCOUNTER — Ambulatory Visit (HOSPITAL_BASED_OUTPATIENT_CLINIC_OR_DEPARTMENT_OTHER): Payer: BC Managed Care – PPO

## 2014-08-25 VITALS — BP 127/58 | HR 81 | Temp 98.8°F | Resp 18 | Wt 210.1 lb

## 2014-08-25 DIAGNOSIS — Z5111 Encounter for antineoplastic chemotherapy: Secondary | ICD-10-CM

## 2014-08-25 DIAGNOSIS — Z452 Encounter for adjustment and management of vascular access device: Secondary | ICD-10-CM

## 2014-08-25 DIAGNOSIS — C50419 Malignant neoplasm of upper-outer quadrant of unspecified female breast: Secondary | ICD-10-CM

## 2014-08-25 DIAGNOSIS — C50411 Malignant neoplasm of upper-outer quadrant of right female breast: Secondary | ICD-10-CM

## 2014-08-25 DIAGNOSIS — Z5112 Encounter for antineoplastic immunotherapy: Secondary | ICD-10-CM

## 2014-08-25 DIAGNOSIS — Z170 Estrogen receptor positive status [ER+]: Secondary | ICD-10-CM

## 2014-08-25 DIAGNOSIS — R197 Diarrhea, unspecified: Secondary | ICD-10-CM

## 2014-08-25 LAB — COMPREHENSIVE METABOLIC PANEL (CC13)
ALBUMIN: 3.4 g/dL — AB (ref 3.5–5.0)
ALT: 16 U/L (ref 0–55)
ANION GAP: 10 meq/L (ref 3–11)
AST: 19 U/L (ref 5–34)
Alkaline Phosphatase: 62 U/L (ref 40–150)
BUN: 7.9 mg/dL (ref 7.0–26.0)
CO2: 21 meq/L — AB (ref 22–29)
Calcium: 8.9 mg/dL (ref 8.4–10.4)
Chloride: 110 mEq/L — ABNORMAL HIGH (ref 98–109)
Creatinine: 0.7 mg/dL (ref 0.6–1.1)
GLUCOSE: 190 mg/dL — AB (ref 70–140)
POTASSIUM: 3.7 meq/L (ref 3.5–5.1)
SODIUM: 141 meq/L (ref 136–145)
TOTAL PROTEIN: 6.5 g/dL (ref 6.4–8.3)
Total Bilirubin: 0.21 mg/dL (ref 0.20–1.20)

## 2014-08-25 LAB — CBC WITH DIFFERENTIAL/PLATELET
BASO%: 0.5 % (ref 0.0–2.0)
Basophils Absolute: 0 10*3/uL (ref 0.0–0.1)
EOS ABS: 0 10*3/uL (ref 0.0–0.5)
EOS%: 0 % (ref 0.0–7.0)
HCT: 34.1 % — ABNORMAL LOW (ref 34.8–46.6)
HGB: 11 g/dL — ABNORMAL LOW (ref 11.6–15.9)
LYMPH%: 22.1 % (ref 14.0–49.7)
MCH: 27.3 pg (ref 25.1–34.0)
MCHC: 32.3 g/dL (ref 31.5–36.0)
MCV: 84.6 fL (ref 79.5–101.0)
MONO#: 0.5 10*3/uL (ref 0.1–0.9)
MONO%: 8.2 % (ref 0.0–14.0)
NEUT#: 4.5 10*3/uL (ref 1.5–6.5)
NEUT%: 69.2 % (ref 38.4–76.8)
PLATELETS: 261 10*3/uL (ref 145–400)
RBC: 4.03 10*6/uL (ref 3.70–5.45)
RDW: 17 % — AB (ref 11.2–14.5)
WBC: 6.5 10*3/uL (ref 3.9–10.3)
lymph#: 1.4 10*3/uL (ref 0.9–3.3)

## 2014-08-25 MED ORDER — ACETAMINOPHEN 325 MG PO TABS
ORAL_TABLET | ORAL | Status: AC
  Filled 2014-08-25: qty 2

## 2014-08-25 MED ORDER — SODIUM CHLORIDE 0.9 % IV SOLN
Freq: Once | INTRAVENOUS | Status: AC
  Administered 2014-08-25: 14:00:00 via INTRAVENOUS

## 2014-08-25 MED ORDER — DOCETAXEL CHEMO INJECTION 160 MG/16ML
75.0000 mg/m2 | Freq: Once | INTRAVENOUS | Status: AC
  Administered 2014-08-25: 160 mg via INTRAVENOUS
  Filled 2014-08-25: qty 16

## 2014-08-25 MED ORDER — HEPARIN SOD (PORK) LOCK FLUSH 100 UNIT/ML IV SOLN
500.0000 [IU] | Freq: Once | INTRAVENOUS | Status: AC | PRN
  Administered 2014-08-25: 500 [IU]
  Filled 2014-08-25: qty 5

## 2014-08-25 MED ORDER — DIPHENHYDRAMINE HCL 25 MG PO CAPS
25.0000 mg | ORAL_CAPSULE | Freq: Once | ORAL | Status: AC
  Administered 2014-08-25: 25 mg via ORAL

## 2014-08-25 MED ORDER — DIPHENHYDRAMINE HCL 25 MG PO CAPS
ORAL_CAPSULE | ORAL | Status: AC
  Filled 2014-08-25: qty 1

## 2014-08-25 MED ORDER — SODIUM CHLORIDE 0.9 % IV SOLN
714.0000 mg | Freq: Once | INTRAVENOUS | Status: AC
  Administered 2014-08-25: 710 mg via INTRAVENOUS
  Filled 2014-08-25: qty 71

## 2014-08-25 MED ORDER — DEXAMETHASONE SODIUM PHOSPHATE 20 MG/5ML IJ SOLN
INTRAMUSCULAR | Status: AC
  Filled 2014-08-25: qty 5

## 2014-08-25 MED ORDER — DEXAMETHASONE SODIUM PHOSPHATE 20 MG/5ML IJ SOLN
20.0000 mg | Freq: Once | INTRAMUSCULAR | Status: AC
  Administered 2014-08-25: 20 mg via INTRAVENOUS

## 2014-08-25 MED ORDER — ONDANSETRON 16 MG/50ML IVPB (CHCC)
16.0000 mg | Freq: Once | INTRAVENOUS | Status: AC
Start: 2014-08-25 — End: 2014-08-25
  Administered 2014-08-25: 16 mg via INTRAVENOUS

## 2014-08-25 MED ORDER — SODIUM CHLORIDE 0.9 % IJ SOLN
10.0000 mL | INTRAMUSCULAR | Status: DC | PRN
  Administered 2014-08-25: 10 mL
  Filled 2014-08-25: qty 10

## 2014-08-25 MED ORDER — TRASTUZUMAB CHEMO INJECTION 440 MG
2.0000 mg/kg | Freq: Once | INTRAVENOUS | Status: AC
  Administered 2014-08-25: 189 mg via INTRAVENOUS
  Filled 2014-08-25: qty 9

## 2014-08-25 MED ORDER — ONDANSETRON 16 MG/50ML IVPB (CHCC)
INTRAVENOUS | Status: AC
  Filled 2014-08-25: qty 16

## 2014-08-25 MED ORDER — ALTEPLASE 2 MG IJ SOLR
2.0000 mg | Freq: Once | INTRAMUSCULAR | Status: AC | PRN
  Administered 2014-08-25: 2 mg
  Filled 2014-08-25: qty 2

## 2014-08-25 MED ORDER — ACETAMINOPHEN 325 MG PO TABS
650.0000 mg | ORAL_TABLET | Freq: Once | ORAL | Status: AC
  Administered 2014-08-25: 650 mg via ORAL

## 2014-08-25 NOTE — Patient Instructions (Signed)
[facility] Discharge Instructions for Patients Receiving Chemotherapy  Today you received the following chemotherapy agents Herceptin, Taxol, Carboplatin.  To help prevent nausea and vomiting after your treatment, we encourage you to take your nausea medication Compazine 10 mg and zofran 8 mg as ordered by provider.   If you develop nausea and vomiting that is not controlled by your nausea medication, call the clinic.   BELOW ARE SYMPTOMS THAT SHOULD BE REPORTED IMMEDIATELY:  *FEVER GREATER THAN 100.5 F  *CHILLS WITH OR WITHOUT FEVER  NAUSEA AND VOMITING THAT IS NOT CONTROLLED WITH YOUR NAUSEA MEDICATION  *UNUSUAL SHORTNESS OF BREATH  *UNUSUAL BRUISING OR BLEEDING  TENDERNESS IN MOUTH AND THROAT WITH OR WITHOUT PRESENCE OF ULCERS  *URINARY PROBLEMS  *BOWEL PROBLEMS  UNUSUAL RASH Items with * indicate a potential emergency and should be followed up as soon as possible.  Feel free to call the clinic you have any questions or concerns. The clinic phone number is [phone].

## 2014-08-25 NOTE — Progress Notes (Signed)
Discharged at 1733, ambulatory in no distress with spouse.Marland Kitchen

## 2014-08-25 NOTE — Progress Notes (Signed)
Patient here for chemotherapy treatment and informed me she needs TPA before treatment.  Accessed and unable to obtain blood return.  Had her stand, raise arm, cough.  Second nurse offered to administer TPA.  Informed her we will have to wait up to an hour to determine if TPA is effective.  At 1330 noted the 08-18-2014 note on xray report to consider TPA.       1345 Positive blood return, free flow with no resistance, 3 ml plus noted from port-a-cath.  Will proceed with chemotherapy treatment.  Patient states she can obtain the flu shot at her job free of charge.  Asked that she notify this office when she receives the injection to document receipt.  Upon discharge, patient expressed concern for port-a-cath fibrin sheath.  Would like to have TPA infusion to remove this in order for no delays with treatments for TPA.  Will notify providers.

## 2014-08-25 NOTE — Progress Notes (Signed)
ID: [name] OB: 08/20/1951  MR#: [id number]  FYB#:[id number]  PCP: Ricke Hey, MD GYN:   SU: Dr. Brantley Stage OTHER MD:  CHIEF COMPLAINT:  Right-sided breast cancer  CURRENT THERAPY:receiving adjuvant chemotherapy  BREAST CANCER HISTORY: From the original consult note:  "Brandy Jackson is a 63 y.o. female. Patient palpated a right breast mass. She had a mammogram performed and she was noted to have a mass in the upper outer quadrant measuring 2.1 x 2.0 x 1.7 cm. 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 Tumor was ER positive PR positive HER-2/neu positive (triple positive) with a proliferation marker Ki-67 at 90%. She had MRI of the breasts performed which showed a solitary mass in the upper outer quadrant measuring 2.5 cm. Her case was discussed at the multidisciplinary breast conference."  Her subsequent treatment is as detailed below.   INTERVAL HISTORY:  Brandy Jackson returns today accompanied by her husband for follow up of her breast cancer. Today is day 1, cycle 5 of 6 planned cycles of carboplatin and docetaxel, given with neulasta on day 2 for granulocyte support. Trastuzumab is also given weekly. Her laryngitis from last week has completely resolved. Since our last visit she has increased her dose of metformin from 552m BID to 1000 BID, and she has noticed a great deal of improvement in her blood glucoses. She has however, experienced some GI upset in the form of diarrhea.   REVIEW OF SYSTEMS:  BDorainedenies fevers, chills, nausea, or vomiting. She has no shortness of breath, chest pain, cough, or fatigue. She has a great appetite, despite a mild taste perversion. Her energy is great and she is walking at least 1 mile per day on the treadmill. Her left great toe has begun to discolor around the cuticle and I advised her to begin using tea tree oil on her nail beds. Her bilateral toes are occasionally numb, but this is fleeting and unchanged. She  is also starting to see hyperpigmentation to her palms. A detailed review of systems is otherwise negative.   PAST MEDICAL HISTORY: Past Medical History  Diagnosis Date  . Diabetes mellitus without complication   . Arthritis   . Hot flashes   . Cancer   . Breast cancer   . Wears glasses   . Wears partial dentures     bottom    PAST SURGICAL HISTORY: Past Surgical History  Procedure Laterality Date  . Bladder repair w/ cesarean section    . Colonoscopy    . Portacath placement N/A 04/11/2014    Procedure: INSERTION PORT-A-CATH;  Surgeon: TJoyice Faster Cornett, MD;  Location: [facility]  Service: General;  Laterality: N/A;  . Re-excision of breast lumpectomy Right 06/06/2014    Procedure: RE-EXCISION OF BREAST LUMPECTOMY;  Surgeon: TMarcello MooresA. Cornett, MD;  Location[facility]  Service: General;  Laterality: Right;    FAMILY HISTORY No family history on file.  GYNECOLOGIC HISTORY: menarche at age 63 GG74 P3 went through menopause at age 63  No h/o of HRT, no h/o abnormal pap smears, or sexually transmitted infections.  Does have frequent vaginal candida infections due to diabetes that she follows with her gynecologist for.    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 The patient has 3 sons, one of whom is studying to  be a pediatrician. All her children are married. She has 6 grandchildren.    ADVANCED DIRECTIVES: not in place.    HEALTH MAINTENANCE: History  Substance Use Topics  . Smoking status: Former Smoker -- 1.00 packs/day    Quit date: 03/08/1986  . Smokeless tobacco: Never Used  . Alcohol Use: No      Mammogram: Colonoscopy: Bone Density Scan:  Pap Smear:  Eye Exam:  Vitamin D Level:   Lipid Panel:    Allergies  Allergen Reactions  . Sulfa Antibiotics Rash    Current  Outpatient Prescriptions  Medication Sig Dispense Refill  . cholestyramine (QUESTRAN) 4 G packet Take 1 packet (4 g total) by mouth 2 (two) times daily.  60 each  12  . dexamethasone (DECADRON) 4 MG tablet Take 2 tablets (8 mg total) by mouth 2 (two) times daily. Take the day before chemo, then the day after chemo x 3 days.  30 tablet  2  . lidocaine-prilocaine (EMLA) cream Apply topically as needed.  30 g  1  . LORazepam (ATIVAN) 0.5 MG tablet Take 1 tablet (0.5 mg total) by mouth every 8 (eight) hours.  30 tablet  0  . metFORMIN (GLUCOPHAGE) 500 MG tablet Take 2 tablets (1,000 mg total) by mouth 2 (two) times daily with a meal.  60 tablet  2  . ondansetron (ZOFRAN) 8 MG tablet Take 1 tablet (8 mg total) by mouth 2 (two) times daily. Take starting the day after chemo for three days, then take twice a day PRN.  20 tablet  1  . Loperamide HCl (IMODIUM PO) Take 1 tablet by mouth.      . oxyCODONE-acetaminophen (ROXICET) 5-325 MG per tablet Take 1 tablet by mouth every 4 (four) hours as needed.  30 tablet  0  . prochlorperazine (COMPAZINE) 10 MG tablet Take 1 tablet (10 mg total) by mouth every 6 (six) hours as needed for nausea or vomiting.  30 tablet  0   No current facility-administered medications for this visit.    OBJECTIVE: Middle-aged [place] American woman who appears stated age There were no vitals filed for this visit.   There is no weight on file to calculate BMI.      Sclerae unicteric, pupils equal and reactive Oropharynx clear and moist-- no thrush No cervical or supraclavicular adenopathy Lungs no rales or rhonchi Heart regular rate and rhythm Abd soft, nontender, positive bowel sounds MSK no focal spinal tenderness, no upper extremity lymphedema Neuro: nonfocal, well oriented, appropriate affect Breasts: deferred   ECOG FS:1  LAB RESULTS:  CMP     Component Value Date/Time   NA 141 08/25/2014 1037   NA 142 04/07/2014 1320   K 3.7 08/25/2014 1037   K 4.2 04/07/2014 1320    CL 103 04/07/2014 1320   CO2 21* 08/25/2014 1037   CO2 25 04/07/2014 1320   GLUCOSE 190* 08/25/2014 1037   GLUCOSE 126* 04/07/2014 1320   BUN 7.9 08/25/2014 1037   BUN 16 04/07/2014 1320   CREATININE 0.7 08/25/2014 1037   CREATININE 0.56 04/07/2014 1320   CALCIUM 8.9 08/25/2014 1037   CALCIUM 9.4 04/07/2014 1320   PROT 6.5 08/25/2014 1037   PROT 7.8 04/07/2014 1320   ALBUMIN 3.4* 08/25/2014 1037   ALBUMIN 3.9 04/07/2014 1320   AST 19 08/25/2014 1037   AST 20 04/07/2014 1320   ALT 16 08/25/2014 1037   ALT 19 04/07/2014 1320   ALKPHOS 62 08/25/2014 1037   ALKPHOS 68 04/07/2014 1320  BILITOT 0.21 08/25/2014 1037   BILITOT 0.2* 04/07/2014 1320   GFRNONAA >90 04/07/2014 1320   GFRAA >90 04/07/2014 1320    I No results found for this basename: SPEP,  UPEP,   kappa and lambda light chains    Lab Results  Component Value Date   WBC 6.5 08/25/2014   NEUTROABS 4.5 08/25/2014   HGB 11.0* 08/25/2014   HCT 34.1* 08/25/2014   MCV 84.6 08/25/2014   PLT 261 08/25/2014      Chemistry      Component Value Date/Time   NA 141 08/25/2014 1037   NA 142 04/07/2014 1320   K 3.7 08/25/2014 1037   K 4.2 04/07/2014 1320   CL 103 04/07/2014 1320   CO2 21* 08/25/2014 1037   CO2 25 04/07/2014 1320   BUN 7.9 08/25/2014 1037   BUN 16 04/07/2014 1320   CREATININE 0.7 08/25/2014 1037   CREATININE 0.56 04/07/2014 1320      Component Value Date/Time   CALCIUM 8.9 08/25/2014 1037   CALCIUM 9.4 04/07/2014 1320   ALKPHOS 62 08/25/2014 1037   ALKPHOS 68 04/07/2014 1320   AST 19 08/25/2014 1037   AST 20 04/07/2014 1320   ALT 16 08/25/2014 1037   ALT 19 04/07/2014 1320   BILITOT 0.21 08/25/2014 1037   BILITOT 0.2* 04/07/2014 1320       No results found for this basename: LABCA2    No components found with this basename: LABCA125    No results found for this basename: INR,  in the last 168 hours  Urinalysis No results found for this basename: colorurine,  appearanceur,  labspec,  phurine,  glucoseu,  hgbur,  bilirubinur,  ketonesur,  proteinur,   urobilinogen,  nitrite,  leukocytesur    STUDIES: No results found.  ASSESSMENT: 63 y.o. [place],woman status post right lumpectomy and sentinel lymph node sampling 04/11/2014 for a pT2 pN1, stage IIB invasive ductal carcinoma, grade 3, estrogen receptor strongly positive, progesterone receptor negative, with an MIB-1 of 94% and HER-2 amplification  (1) additional surgery in 06/06/2014 for margin clearance showed no evidence of residual malignancy  (2) received the first of six planned cycles of adjuvant chemotherapy with carboplatin docetaxel and trastuzumab 05/02/2014, with chemotherapy interrupted because of her intervening surgery; chemo to be resumed 06/26/2014  (3) continuing trastuzumab through May 2016 to complete 1 year, most recent echocardiogram 04/05/2014 showed a normal ejection fraction  (4) the patient will need adjuvant radiation at the completion of chemotherapy  (5) she will start antiestrogen therapy at the completion of radiation  PLAN:  Brandy Jackson is tolerating her chemo regimen well. Last week's dose of herceptin was held because of issues with her port. I reminded her about the use of Lucrezia Starch that will help with her diarrhea, as the imodium is ineffective. The labs were reviewed in detail with her and were stable. We will proceed with day 1, cycle 5 of the docetaxel and carboplatin today, along with the trastuzumab.  Dynver will return next week for trastuzumab alone. She understands and agrees with this plan. She knows a goal of treatment in her case is cure. She has been encouraged to call with any issues that mgiht arise before her next visit here.   Brandy Jackson, [place] [phone] 08/25/2014 11:43 AM

## 2014-08-26 ENCOUNTER — Ambulatory Visit (HOSPITAL_BASED_OUTPATIENT_CLINIC_OR_DEPARTMENT_OTHER): Payer: BC Managed Care – PPO

## 2014-08-26 DIAGNOSIS — C50411 Malignant neoplasm of upper-outer quadrant of right female breast: Secondary | ICD-10-CM

## 2014-08-26 DIAGNOSIS — C50419 Malignant neoplasm of upper-outer quadrant of unspecified female breast: Secondary | ICD-10-CM

## 2014-08-26 DIAGNOSIS — Z5189 Encounter for other specified aftercare: Secondary | ICD-10-CM

## 2014-08-26 MED ORDER — PEGFILGRASTIM INJECTION 6 MG/0.6ML
6.0000 mg | Freq: Once | SUBCUTANEOUS | Status: AC
  Administered 2014-08-26: 6 mg via SUBCUTANEOUS

## 2014-08-26 NOTE — Progress Notes (Signed)
Neulasta orders not signed. Per patient,  states she is to receive neulasta today and has been receiving. Orders in place, but not signed with yesterdays treatment. Treatment hx shows patient receives neulasta day 2 of taxotere and [place] tx. 2 nurse verified, second nurse M. Leonetti Therapist, sports.

## 2014-08-26 NOTE — Patient Instructions (Signed)
Pegfilgrastim injection What is this medicine? PEGFILGRASTIM (peg fil GRA stim) is a long-acting granulocyte colony-stimulating factor that stimulates the growth of neutrophils, a type of white blood cell important in the body's fight against infection. It is used to reduce the incidence of fever and infection in patients with certain types of cancer who are receiving chemotherapy that affects the bone marrow. This medicine may be used for other purposes; ask your health care provider or pharmacist if you have questions. COMMON BRAND NAME(S): Neulasta What should I tell my health care provider before I take this medicine? They need to know if you have any of these conditions: -latex allergy -ongoing radiation therapy -sickle cell disease -skin reactions to acrylic adhesives (On-Body Injector only) -an unusual or allergic reaction to pegfilgrastim, filgrastim, other medicines, foods, dyes, or preservatives -pregnant or trying to get pregnant -breast-feeding How should I use this medicine? This medicine is for injection under the skin. If you get this medicine at home, you will be taught how to prepare and give the pre-filled syringe or how to use the On-body Injector. Refer to the patient Instructions for Use for detailed instructions. Use exactly as directed. Take your medicine at regular intervals. Do not take your medicine more often than directed. It is important that you put your used needles and syringes in a special sharps container. Do not put them in a trash can. If you do not have a sharps container, call your pharmacist or healthcare provider to get one. Talk to your pediatrician regarding the use of this medicine in children. Special care may be needed. Overdosage: If you think you have taken too much of this medicine contact a poison control center or emergency room at once. NOTE: This medicine is only for you. Do not share this medicine with others. What if I miss a dose? It is  important not to miss your dose. Call your doctor or health care professional if you miss your dose. If you miss a dose due to an On-body Injector failure or leakage, a new dose should be administered as soon as possible using a single prefilled syringe for manual use. What may interact with this medicine? Interactions have not been studied. Give your health care provider a list of all the medicines, herbs, non-prescription drugs, or dietary supplements you use. Also tell them if you smoke, drink alcohol, or use illegal drugs. Some items may interact with your medicine. This list may not describe all possible interactions. Give your health care provider a list of all the medicines, herbs, non-prescription drugs, or dietary supplements you use. Also tell them if you smoke, drink alcohol, or use illegal drugs. Some items may interact with your medicine. What should I watch for while using this medicine? You may need blood work done while you are taking this medicine. If you are going to need a MRI, CT scan, or other procedure, tell your doctor that you are using this medicine (On-Body Injector only). What side effects may I notice from receiving this medicine? Side effects that you should report to your doctor or health care professional as soon as possible: -allergic reactions like skin rash, itching or hives, swelling of the face, lips, or tongue -dizziness -fever -pain, redness, or irritation at site where injected -pinpoint red spots on the skin -shortness of breath or breathing problems -stomach or side pain, or pain at the shoulder -swelling -tiredness -trouble passing urine Side effects that usually do not require medical attention (report to your doctor   or health care professional if they continue or are bothersome): -bone pain -muscle pain This list may not describe all possible side effects. Call your doctor for medical advice about side effects. You may report side effects to FDA at  1-800-FDA-1088. Where should I keep my medicine? Keep out of the reach of children. Store pre-filled syringes in a refrigerator between 2 and 8 degrees C (36 and 46 degrees F). Do not freeze. Keep in carton to protect from light. Throw away this medicine if it is left out of the refrigerator for more than 48 hours. Throw away any unused medicine after the expiration date. NOTE: This sheet is a summary. It may not cover all possible information. If you have questions about this medicine, talk to your doctor, pharmacist, or health care provider.  2015, Elsevier/Gold Standard. (2014-02-23 16:14:05)  

## 2014-08-28 ENCOUNTER — Telehealth: Payer: Self-pay | Admitting: Oncology

## 2014-08-28 ENCOUNTER — Encounter: Payer: Self-pay | Admitting: Nurse Practitioner

## 2014-08-28 DIAGNOSIS — T85698A Other mechanical complication of other specified internal prosthetic devices, implants and grafts, initial encounter: Secondary | ICD-10-CM | POA: Insufficient documentation

## 2014-08-28 DIAGNOSIS — T85618A Breakdown (mechanical) of other specified internal prosthetic devices, implants and grafts, initial encounter: Secondary | ICD-10-CM

## 2014-08-28 NOTE — Assessment & Plan Note (Addendum)
Patient presented today to receive cycle 4, day 15 of the Herceptin only portion of her carboplatin/docetaxel/Herceptin chemotherapy regimen.  The cancer Center nurse noted that there was intermittent edema noted directly above her Port-A-Cath site when they flushed the catheter with saline.  Herceptin was held today; and patient did obtain an IV dye study to determine Port-A-Cath placement.  Dye study impression revealed left subclavian Port-A-Cath tubing loops into the soft tissue in the mid clavicle region with the tip terminating in the proximal to mid SVC.  Small amount of fibrin sheath or thrombus at the catheter tip.  Consider lysis with TPA.  Patient has plans to return to the [facility] on 08/25/2014 for her next cycle of chemotherapy.  She may very well require cathflo prior to her infusion at that time.

## 2014-08-28 NOTE — Telephone Encounter (Signed)
, °

## 2014-08-28 NOTE — Assessment & Plan Note (Signed)
Patient presented today to receive cycle 4, day 15 of the Herceptin only portion of her carboplatin/docetaxel/Herceptin chemotherapy regimen.  The cancer Center nurse noted that there was intermittent edema noted directly above her Port-A-Cath site a when they flushed the catheter with saline.  Due to the need for an IV dye study to determine Port-A-Cath placement-Herceptin will be held today.  Patient will receive her chemotherapy as previously scheduled on 08/25/2014.

## 2014-08-28 NOTE — Progress Notes (Signed)
[facility]   Chief Complaint  Patient presents with  . Vascular Access Problem    HPI: Brandy Jackson 63 y.o. female diagnosed with breast cancer.  Currently undergoing carboplatin/docetaxel/Herceptin chemotherapy therapy regimen.  Patient presented for cycle 4, day 15 of the Herceptin only portion of her chemotherapy regimen.  It was noted that she developed some edema just above her Port-A-Cath site when cath was flushed with saline.  Patient denied any chest pain, chest pressure, or shortness of breath.  She also denies any recent fevers or chills.  The Herceptin infusion was held; and patient was sent for an IV dye study of Port-A-Cath.  HPI  CURRENT THERAPY: Upcoming Treatment Dates - BREAST TCH q21d (order Herceptin separately) Days with orders from any treatment category:  08/26/2014      SCHEDULING COMMUNICATION INJECTION      pegfilgrastim (NEULASTA) injection 6 mg 09/15/2014      SCHEDULING COMMUNICATION      ondansetron (ZOFRAN) IVPB 16 mg      Dexamethasone Sodium Phosphate (DECADRON) injection 20 mg      DOCEtaxel (TAXOTERE) 160 mg in dextrose 5 % 250 mL chemo infusion      CARBOplatin (PARAPLATIN) 710 mg in sodium chloride 0.9 % 250 mL chemo infusion      sodium chloride 0.9 % injection 10 mL      heparin lock flush 100 unit/mL      heparin lock flush 100 unit/mL      alteplase (CATHFLO ACTIVASE) injection 2 mg      sodium chloride 0.9 % injection 3 mL      Cold Pack 1 packet      diphenhydrAMINE (BENADRYL) injection 25 mg      famotidine (PEPCID) IVPB 20 mg      0.9 %  sodium chloride infusion      methylPREDNISolone sodium succinate (SOLU-MEDROL) 125 mg/2 mL injection 125 mg      EPINEPHrine (ADRENALIN) 0.1 MG/ML injection 0.25 mg      EPINEPHrine (ADRENALIN) 0.1 MG/ML injection 0.25 mg      EPINEPHrine (ADRENALIN) injection 0.5 mg      EPINEPHrine (ADRENALIN) injection 0.5 mg      diphenhydrAMINE (BENADRYL) injection 50 mg  albuterol (PROVENTIL) (2.5 MG/3ML) 0.083% nebulizer solution 2.5 mg      0.9 %  sodium chloride infusion      TREATMENT CONDITIONS 09/16/2014      SCHEDULING COMMUNICATION INJECTION      pegfilgrastim (NEULASTA) injection 6 mg    ROS  Past Medical History  Diagnosis Date  . Diabetes mellitus without complication   . Arthritis   . Hot flashes   . Cancer   . Breast cancer   . Wears glasses   . Wears partial dentures     bottom    Past Surgical History  Procedure Laterality Date  . Bladder repair w/ cesarean section    . Colonoscopy    . Portacath placement N/A 04/11/2014    Procedure: INSERTION PORT-A-CATH;  Surgeon: Joyice Faster. Cornett, MD;  Location: [facility];  Service: General;  Laterality: N/A;  . Re-excision of breast lumpectomy Right 06/06/2014    Procedure: RE-EXCISION OF BREAST LUMPECTOMY;  Surgeon: Marcello Moores A. Cornett, MD;  Location: [facility];  Service: General;  Laterality: Right;    has Breast cancer of upper-outer quadrant of right female breast; Post-operative state; Rectal bleed; Type II or unspecified type diabetes mellitus without mention of complication, not  stated as uncontrolled; Peripheral neuropathy due to chemotherapy; Diarrhea; Fatigue due to treatment; Laryngitis; and Malfunction of device on her problem list.     is allergic to sulfa antibiotics.    Medication List       This list is accurate as of: 08/18/14 11:59 PM.  Always use your most recent med list.               cholestyramine 4 G packet  Commonly known as:  QUESTRAN  Take 1 packet (4 g total) by mouth 2 (two) times daily.     dexamethasone 4 MG tablet  Commonly known as:  DECADRON  Take 2 tablets (8 mg total) by mouth 2 (two) times daily. Take the day before chemo, then the day after chemo x 3 days.     IMODIUM PO  Take 1 tablet by mouth.     lidocaine-prilocaine cream  Commonly known as:  EMLA  Apply topically as needed.     LORazepam 0.5 MG tablet   Commonly known as:  ATIVAN  Take 1 tablet (0.5 mg total) by mouth every 8 (eight) hours.     metFORMIN 500 MG tablet  Commonly known as:  GLUCOPHAGE  Take 2 tablets (1,000 mg total) by mouth 2 (two) times daily with a meal.     ondansetron 8 MG tablet  Commonly known as:  ZOFRAN  Take 1 tablet (8 mg total) by mouth 2 (two) times daily. Take starting the day after chemo for three days, then take twice a day PRN.     oxyCODONE-acetaminophen 5-325 MG per tablet  Commonly known as:  ROXICET  Take 1 tablet by mouth every 4 (four) hours as needed.     prochlorperazine 10 MG tablet  Commonly known as:  COMPAZINE  Take 1 tablet (10 mg total) by mouth every 6 (six) hours as needed for nausea or vomiting.         PHYSICAL EXAMINATION Vitals: BP 145/70, HR 87, temp 98.2  Physical Exam  LABORATORY DATA:. CBC  Lab Results  Component Value Date   WBC 6.5 08/25/2014   RBC 4.03 08/25/2014   HGB 11.0* 08/25/2014   HCT 34.1* 08/25/2014   PLT 261 08/25/2014   MCV 84.6 08/25/2014   MCH 27.3 08/25/2014   MCHC 32.3 08/25/2014   RDW 17.0* 08/25/2014   LYMPHSABS 1.4 08/25/2014   MONOABS 0.5 08/25/2014   EOSABS 0.0 08/25/2014   BASOSABS 0.0 08/25/2014     CMET  Lab Results  Component Value Date   NA 141 08/25/2014   K 3.7 08/25/2014   CL 103 04/07/2014   CO2 21* 08/25/2014   GLUCOSE 190* 08/25/2014   BUN 7.9 08/25/2014   CREATININE 0.7 08/25/2014   CALCIUM 8.9 08/25/2014   PROT 6.5 08/25/2014   ALBUMIN 3.4* 08/25/2014   AST 19 08/25/2014   ALT 16 08/25/2014   ALKPHOS 62 08/25/2014   BILITOT 0.21 08/25/2014   GFRNONAA >90 04/07/2014   GFRAA >90 04/07/2014    RADIOGRAPHIC STUDIES:       IR CV Line Injection Status: Final result         PACS Images    Show images for IR CV Line Injection         Study Result    CLINICAL DATA: Breast cancer, malfunctioning left subclavian port  catheter  EXAM:  LEFT SUBCLAVIAN PORT CATHETER ACCESS AND CONTRAST INJECTION  Date: 9/11/20159/10/2014  4:02 pm  Radiologist: M. Daryll Brod, MD  Guidance: Fluoroscopic  FLUOROSCOPY TIME: Eighteen second  MEDICATIONS AND MEDICAL HISTORY:  None.  ANESTHESIA/SEDATION:  None.  CONTRAST: 31mL OMNIPAQUE IOHEXOL 300 MG/ML SOLN  COMPLICATIONS:  No immediate  PROCEDURE:  Informed consent was obtained from the patient following explanation  of the procedure, risks, benefits and alternatives. The patient  understands, agrees and consents for the procedure. All questions  were addressed. A time out was performed.  Maximal barrier sterile technique utilized including caps, mask,  sterile gowns, sterile gloves, large sterile drape, hand hygiene,  and a ChloraPrep.  Under sterile conditions, the left subclavian port catheter was  accessed. Blood could not be aspirated. Saline flushed easily.  Contrast injection under fluoroscopy demonstrates tortuosity of the  tubing in the mid clavicle region. Tubing is intact terminating in  the proximal to mid SVC. Small amount of fibrin sheath/ thrombus at  the catheter tip noted. SVC patent.  IMPRESSION:  Left subclavian port catheter tubing loops in the soft tissues in  the mid clavicle region with the tip terminating in the proximal to  mid SVC.  Small amount of fibrin sheath or thrombus at the catheter tip.  Consider lysis with TPA.  These results were called by telephone at the time of interpretation  on 08/18/2014 at 4:05 pm to Dr. Drue Second , who verbally  acknowledged these results.  Electronically Signed  By: Daryll Brod M.D.  On: 08/18/2014 16:06      ASSESSMENT/PLAN:    Breast cancer of upper-outer quadrant of right female breast  Assessment & Plan Patient presented today to receive cycle 4, day 15 of the Herceptin only portion of her carboplatin/docetaxel/Herceptin chemotherapy regimen.  The cancer Center nurse noted that there was intermittent edema noted directly above her Port-A-Cath site a when they flushed the catheter with  saline.  Due to the need for an IV dye study to determine Port-A-Cath placement-Herceptin will be held today.  Patient will receive her chemotherapy as previously scheduled on 08/25/2014.   Malfunction of device  Assessment & Plan Patient presented today to receive cycle 4, day 15 of the Herceptin only portion of her carboplatin/docetaxel/Herceptin chemotherapy regimen.  The cancer Center nurse noted that there was intermittent edema noted directly above her Port-A-Cath site when they flushed the catheter with saline.  Herceptin was held today; and patient did obtain an IV dye study to determine Port-A-Cath placement.  Dye study impression revealed left subclavian Port-A-Cath tubing loops into the soft tissue in the mid clavicle region with the tip terminating in the proximal to mid SVC.  Small amount of fibrin sheath or thrombus at the catheter tip.  Consider lysis with TPA.  Patient has plans to return to the [facility] on 08/25/2014 for her next cycle of chemotherapy.  She may very well require cathflo prior to her infusion at that time.   Patient stated understanding of all instructions; and was in agreement with this plan of care. The patient knows to call the clinic with any problems, questions or concerns.   Review/collaboration with Dr. Jana Hakim regarding all aspects of patient's visit today.   Total time spent with patient was 25 minutes;  with greater than 75 percent of that time spent in face to face counseling regarding her symptoms, follow up IV dye study results, and coordination of care and follow up.  Disclaimer: This note was dictated with voice recognition software. Similar sounding words can inadvertently be transcribed and may not be corrected upon review.   Drue Second, NP 08/28/2014

## 2014-08-31 ENCOUNTER — Other Ambulatory Visit: Payer: Self-pay | Admitting: *Deleted

## 2014-08-31 DIAGNOSIS — C50411 Malignant neoplasm of upper-outer quadrant of right female breast: Secondary | ICD-10-CM

## 2014-09-01 ENCOUNTER — Ambulatory Visit (HOSPITAL_BASED_OUTPATIENT_CLINIC_OR_DEPARTMENT_OTHER): Payer: BC Managed Care – PPO | Admitting: Oncology

## 2014-09-01 ENCOUNTER — Ambulatory Visit (HOSPITAL_BASED_OUTPATIENT_CLINIC_OR_DEPARTMENT_OTHER): Payer: BC Managed Care – PPO

## 2014-09-01 ENCOUNTER — Other Ambulatory Visit (HOSPITAL_BASED_OUTPATIENT_CLINIC_OR_DEPARTMENT_OTHER): Payer: BC Managed Care – PPO

## 2014-09-01 ENCOUNTER — Other Ambulatory Visit: Payer: BC Managed Care – PPO

## 2014-09-01 ENCOUNTER — Telehealth: Payer: Self-pay | Admitting: *Deleted

## 2014-09-01 ENCOUNTER — Encounter: Payer: Self-pay | Admitting: Oncology

## 2014-09-01 VITALS — BP 145/60 | HR 93 | Temp 98.7°F | Resp 19 | Ht 67.0 in | Wt 211.1 lb

## 2014-09-01 DIAGNOSIS — C50411 Malignant neoplasm of upper-outer quadrant of right female breast: Secondary | ICD-10-CM

## 2014-09-01 DIAGNOSIS — Z170 Estrogen receptor positive status [ER+]: Secondary | ICD-10-CM

## 2014-09-01 DIAGNOSIS — R1032 Left lower quadrant pain: Secondary | ICD-10-CM

## 2014-09-01 DIAGNOSIS — R197 Diarrhea, unspecified: Secondary | ICD-10-CM

## 2014-09-01 DIAGNOSIS — R300 Dysuria: Secondary | ICD-10-CM

## 2014-09-01 DIAGNOSIS — C50419 Malignant neoplasm of upper-outer quadrant of unspecified female breast: Secondary | ICD-10-CM

## 2014-09-01 DIAGNOSIS — Z5112 Encounter for antineoplastic immunotherapy: Secondary | ICD-10-CM

## 2014-09-01 LAB — COMPREHENSIVE METABOLIC PANEL (CC13)
ALBUMIN: 3.6 g/dL (ref 3.5–5.0)
ALT: 21 U/L (ref 0–55)
AST: 20 U/L (ref 5–34)
Alkaline Phosphatase: 115 U/L (ref 40–150)
Anion Gap: 10 mEq/L (ref 3–11)
BUN: 8.8 mg/dL (ref 7.0–26.0)
CALCIUM: 9.4 mg/dL (ref 8.4–10.4)
CO2: 24 mEq/L (ref 22–29)
CREATININE: 0.7 mg/dL (ref 0.6–1.1)
Chloride: 106 mEq/L (ref 98–109)
GLUCOSE: 129 mg/dL (ref 70–140)
POTASSIUM: 3.9 meq/L (ref 3.5–5.1)
Sodium: 140 mEq/L (ref 136–145)
Total Bilirubin: 0.21 mg/dL (ref 0.20–1.20)
Total Protein: 6.9 g/dL (ref 6.4–8.3)

## 2014-09-01 LAB — CBC WITH DIFFERENTIAL/PLATELET
BASO%: 0.7 % (ref 0.0–2.0)
BASOS ABS: 0.1 10*3/uL (ref 0.0–0.1)
EOS ABS: 0.1 10*3/uL (ref 0.0–0.5)
EOS%: 0.6 % (ref 0.0–7.0)
HCT: 35.2 % (ref 34.8–46.6)
HEMOGLOBIN: 11.1 g/dL — AB (ref 11.6–15.9)
LYMPH%: 18.4 % (ref 14.0–49.7)
MCH: 26.7 pg (ref 25.1–34.0)
MCHC: 31.6 g/dL (ref 31.5–36.0)
MCV: 84.5 fL (ref 79.5–101.0)
MONO#: 1.5 10*3/uL — ABNORMAL HIGH (ref 0.1–0.9)
MONO%: 9.9 % (ref 0.0–14.0)
NEUT#: 10.4 10*3/uL — ABNORMAL HIGH (ref 1.5–6.5)
NEUT%: 70.4 % (ref 38.4–76.8)
PLATELETS: 191 10*3/uL (ref 145–400)
RBC: 4.17 10*6/uL (ref 3.70–5.45)
RDW: 17.2 % — AB (ref 11.2–14.5)
WBC: 14.7 10*3/uL — ABNORMAL HIGH (ref 3.9–10.3)
lymph#: 2.7 10*3/uL (ref 0.9–3.3)

## 2014-09-01 LAB — URINALYSIS, MICROSCOPIC - CHCC
Bilirubin (Urine): NEGATIVE
Blood: NEGATIVE
Glucose: NEGATIVE mg/dL
KETONES: NEGATIVE mg/dL
LEUKOCYTE ESTERASE: NEGATIVE
NITRITE: NEGATIVE
Protein: NEGATIVE mg/dL
RBC / HPF: NEGATIVE (ref 0–2)
Specific Gravity, Urine: 1.015 (ref 1.003–1.035)
Urobilinogen, UR: 0.2 mg/dL (ref 0.2–1)
pH: 6 (ref 4.6–8.0)

## 2014-09-01 MED ORDER — HEPARIN SOD (PORK) LOCK FLUSH 100 UNIT/ML IV SOLN
500.0000 [IU] | Freq: Once | INTRAVENOUS | Status: AC | PRN
  Administered 2014-09-01: 500 [IU]
  Filled 2014-09-01: qty 5

## 2014-09-01 MED ORDER — ACETAMINOPHEN 325 MG PO TABS
650.0000 mg | ORAL_TABLET | Freq: Once | ORAL | Status: AC
  Administered 2014-09-01: 650 mg via ORAL

## 2014-09-01 MED ORDER — DIPHENHYDRAMINE HCL 25 MG PO CAPS
ORAL_CAPSULE | ORAL | Status: AC
  Filled 2014-09-01: qty 1

## 2014-09-01 MED ORDER — ACETAMINOPHEN 325 MG PO TABS
ORAL_TABLET | ORAL | Status: AC
  Filled 2014-09-01: qty 2

## 2014-09-01 MED ORDER — SODIUM CHLORIDE 0.9 % IJ SOLN
10.0000 mL | INTRAMUSCULAR | Status: DC | PRN
  Administered 2014-09-01: 10 mL
  Filled 2014-09-01: qty 10

## 2014-09-01 MED ORDER — SODIUM CHLORIDE 0.9 % IV SOLN
Freq: Once | INTRAVENOUS | Status: AC
  Administered 2014-09-01: 14:00:00 via INTRAVENOUS

## 2014-09-01 MED ORDER — TRASTUZUMAB CHEMO INJECTION 440 MG
2.0000 mg/kg | Freq: Once | INTRAVENOUS | Status: AC
  Administered 2014-09-01: 189 mg via INTRAVENOUS
  Filled 2014-09-01: qty 9

## 2014-09-01 MED ORDER — DIPHENHYDRAMINE HCL 25 MG PO CAPS
25.0000 mg | ORAL_CAPSULE | Freq: Once | ORAL | Status: AC
  Administered 2014-09-01: 25 mg via ORAL

## 2014-09-01 NOTE — Patient Instructions (Signed)
[facility] Discharge Instructions for Patients Receiving Chemotherapy  Today you received the following chemotherapy agents Herceptin  To help prevent nausea and vomiting after your treatment, we encourage you to take your nausea medication     If you develop nausea and vomiting that is not controlled by your nausea medication, call the clinic.   BELOW ARE SYMPTOMS THAT SHOULD BE REPORTED IMMEDIATELY:  *FEVER GREATER THAN 100.5 F  *CHILLS WITH OR WITHOUT FEVER  NAUSEA AND VOMITING THAT IS NOT CONTROLLED WITH YOUR NAUSEA MEDICATION  *UNUSUAL SHORTNESS OF BREATH  *UNUSUAL BRUISING OR BLEEDING  TENDERNESS IN MOUTH AND THROAT WITH OR WITHOUT PRESENCE OF ULCERS  *URINARY PROBLEMS  *BOWEL PROBLEMS  UNUSUAL RASH Items with * indicate a potential emergency and should be followed up as soon as possible.  Feel free to call the clinic you have any questions or concerns. The clinic phone number is [phone].    

## 2014-09-01 NOTE — Progress Notes (Signed)
Spoke with patient, let her know her u/a was normal. No infection. She was very [employer] attorney.

## 2014-09-01 NOTE — Progress Notes (Signed)
ID: Brandy Jackson OB: 01/25/1951  MR#: [id number]  PYP#:[id number]  PCP: Billee Cashing, MD GYN:   SU: Dr. Luisa Hart OTHER MD:  CHIEF COMPLAINT:  Right-sided breast cancer  CURRENT THERAPY:receiving adjuvant chemotherapy  BREAST CANCER HISTORY: From the original consult note:  "Brandy Jackson is a 63 y.o. female. Patient palpated a right breast mass. She had a mammogram performed and she was noted to have a mass in the upper outer quadrant measuring 2.1 x 2.0 x 1.7 cm. 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 Tumor was ER positive PR positive HER-2/neu positive (triple positive) with a proliferation marker Ki-67 at 90%. She had MRI of the breasts performed which showed a solitary mass in the upper outer quadrant measuring 2.5 cm. Her case was discussed at the multidisciplinary breast conference."  Her subsequent treatment is as detailed below.   INTERVAL HISTORY:  Brandy Jackson returns today accompanied by her husband for follow up of her breast cancer. Today is day 8, cycle 5 of 6 planned cycles of carboplatin and docetaxel, given with neulasta on day 2 for granulocyte support. Trastuzumab is also given weekly. She continues to have intermittent diarrhea due to a recent increase in her metformin. She uses Questran on occasion. She had left lower quadrant abdominal pain on one occasion, but that has now resolved. She is worried that she might have a urinary tract infection. She denies dysuria, hematuria, or any other changes to urination. Blood sugars are better.   REVIEW OF SYSTEMS:  Brandy Jackson denies fevers, chills, nausea, or vomiting. She has no shortness of breath, chest pain, cough, or fatigue. She has a great appetite, despite a mild taste perversion. Her energy is great and she is walking at least 1 mile per day on the treadmill. Her left great toe has begun to discolor around the cuticle and I advised her to begin using tea tree oil on her nail beds. Her  bilateral toes are occasionally numb, but this is fleeting and unchanged. She is also starting to see hyperpigmentation to her palms. A detailed review of systems is otherwise negative.   PAST MEDICAL HISTORY: Past Medical History  Diagnosis Date  . Diabetes mellitus without complication   . Arthritis   . Hot flashes   . Cancer   . Breast cancer   . Wears glasses   . Wears partial dentures     bottom    PAST SURGICAL HISTORY: Past Surgical History  Procedure Laterality Date  . Bladder repair w/ cesarean section    . Colonoscopy    . Portacath placement N/A 04/11/2014    Procedure: INSERTION PORT-A-CATH;  Surgeon: Clovis Pu. Cornett, MD;  Location: [facility];  Service: General;  Laterality: N/A;  . Re-excision of breast lumpectomy Right 06/06/2014    Procedure: RE-EXCISION OF BREAST LUMPECTOMY;  Surgeon: Maisie Fus A. Cornett, MD;  Location: [facility];  Service: General;  Laterality: Right;    FAMILY HISTORY History reviewed. No pertinent family history.  GYNECOLOGIC HISTORY: menarche at age 81, G12 P3, went through menopause at age 47.  No h/o of HRT, no h/o abnormal pap smears, or sexually transmitted infections.  Does have frequent vaginal candida infections due to diabetes that she follows with her gynecologist for.    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 Her husband Casimiro Needle is retired from [employer]. He is a cancer survivor himself. The  patient has 3 sons, one of whom is studying to be a pediatrician. All her children are married. She has 6 grandchildren.    ADVANCED DIRECTIVES: not in place.    HEALTH MAINTENANCE: History  Substance Use Topics  . Smoking status: Former Smoker -- 1.00 packs/day    Quit date: 03/08/1986  . Smokeless tobacco: Never Used  . Alcohol Use: No      Mammogram: Colonoscopy: Bone Density Scan:  Pap Smear:  Eye Exam:  Vitamin D  Level:   Lipid Panel:    Allergies  Allergen Reactions  . Sulfa Antibiotics Rash    Current Outpatient Prescriptions  Medication Sig Dispense Refill  . cholestyramine (QUESTRAN) 4 G packet Take 1 packet (4 g total) by mouth 2 (two) times daily.  60 each  12  . dexamethasone (DECADRON) 4 MG tablet Take 2 tablets (8 mg total) by mouth 2 (two) times daily. Take the day before chemo, then the day after chemo x 3 days.  30 tablet  2  . lidocaine-prilocaine (EMLA) cream Apply topically as needed.  30 g  1  . Loperamide HCl (IMODIUM PO) Take 1 tablet by mouth.      . metFORMIN (GLUCOPHAGE) 500 MG tablet Take 2 tablets (1,000 mg total) by mouth 2 (two) times daily with a meal.  60 tablet  2  . ondansetron (ZOFRAN) 8 MG tablet Take 1 tablet (8 mg total) by mouth 2 (two) times daily. Take starting the day after chemo for three days, then take twice a day PRN.  20 tablet  1   No current facility-administered medications for this visit.   Facility-Administered Medications Ordered in Other Visits  Medication Dose Route Frequency Provider Last Rate Last Dose  . sodium chloride 0.9 % injection 10 mL  10 mL Intracatheter PRN Chauncey Cruel, MD   10 mL at 09/01/14 1443    OBJECTIVE: Middle-aged [place] American woman who appears stated age 24 Vitals:   09/01/14 1255  BP: 145/60  Pulse: 93  Temp: 98.7 F (37.1 C)  Resp: 19     Body mass index is 33.06 kg/(m^2).      Sclerae unicteric, pupils equal and reactive Oropharynx clear and moist-- no thrush No cervical or supraclavicular adenopathy Lungs no rales or rhonchi Heart regular rate and rhythm Abd soft, nontender, positive bowel sounds MSK no focal spinal tenderness, no upper extremity lymphedema Neuro: nonfocal, well oriented, appropriate affect Breasts: deferred   ECOG FS:1  LAB RESULTS:  CMP     Component Value Date/Time   NA 140 09/01/2014 1236   NA 142 04/07/2014 1320   K 3.9 09/01/2014 1236   K 4.2 04/07/2014 1320   CL  103 04/07/2014 1320   CO2 24 09/01/2014 1236   CO2 25 04/07/2014 1320   GLUCOSE 129 09/01/2014 1236   GLUCOSE 126* 04/07/2014 1320   BUN 8.8 09/01/2014 1236   BUN 16 04/07/2014 1320   CREATININE 0.7 09/01/2014 1236   CREATININE 0.56 04/07/2014 1320   CALCIUM 9.4 09/01/2014 1236   CALCIUM 9.4 04/07/2014 1320   PROT 6.9 09/01/2014 1236   PROT 7.8 04/07/2014 1320   ALBUMIN 3.6 09/01/2014 1236   ALBUMIN 3.9 04/07/2014 1320   AST 20 09/01/2014 1236   AST 20 04/07/2014 1320   ALT 21 09/01/2014 1236   ALT 19 04/07/2014 1320   ALKPHOS 115 09/01/2014 1236   ALKPHOS 68 04/07/2014 1320   BILITOT 0.21 09/01/2014 1236   BILITOT 0.2* 04/07/2014 1320  GFRNONAA >90 04/07/2014 1320   GFRAA >90 04/07/2014 1320    I No results found for this basename: SPEP,  UPEP,   kappa and lambda light chains    Lab Results  Component Value Date   WBC 14.7* 09/01/2014   NEUTROABS 10.4* 09/01/2014   HGB 11.1* 09/01/2014   HCT 35.2 09/01/2014   MCV 84.5 09/01/2014   PLT 191 09/01/2014      Chemistry      Component Value Date/Time   NA 140 09/01/2014 1236   NA 142 04/07/2014 1320   K 3.9 09/01/2014 1236   K 4.2 04/07/2014 1320   CL 103 04/07/2014 1320   CO2 24 09/01/2014 1236   CO2 25 04/07/2014 1320   BUN 8.8 09/01/2014 1236   BUN 16 04/07/2014 1320   CREATININE 0.7 09/01/2014 1236   CREATININE 0.56 04/07/2014 1320      Component Value Date/Time   CALCIUM 9.4 09/01/2014 1236   CALCIUM 9.4 04/07/2014 1320   ALKPHOS 115 09/01/2014 1236   ALKPHOS 68 04/07/2014 1320   AST 20 09/01/2014 1236   AST 20 04/07/2014 1320   ALT 21 09/01/2014 1236   ALT 19 04/07/2014 1320   BILITOT 0.21 09/01/2014 1236   BILITOT 0.2* 04/07/2014 1320       No results found for this basename: LABCA2    No components found with this basename: LABCA125    No results found for this basename: INR,  in the last 168 hours  Urinalysis No results found for this basename: colorurine,  appearanceur,  labspec,  phurine,  glucoseu,  hgbur,  bilirubinur,  ketonesur,  proteinur,   urobilinogen,  nitrite,  leukocytesur    STUDIES: No results found.  ASSESSMENT: 63 y.o. [place],woman status post right lumpectomy and sentinel lymph node sampling 04/11/2014 for a pT2 pN1, stage IIB invasive ductal carcinoma, grade 3, estrogen receptor strongly positive, progesterone receptor negative, with an MIB-1 of 94% and HER-2 amplification  (1) additional surgery in 06/06/2014 for margin clearance showed no evidence of residual malignancy  (2) received the first of six planned cycles of adjuvant chemotherapy with carboplatin docetaxel and trastuzumab 05/02/2014, with chemotherapy interrupted because of her intervening surgery; chemo to be resumed 06/26/2014  (3) continuing trastuzumab through May 2016 to complete 1 year, most recent echocardiogram 04/05/2014 showed a normal ejection fraction  (4) the patient will need adjuvant radiation at the completion of chemotherapy  (5) she will start antiestrogen therapy at the completion of radiation  PLAN:  Brandy Jackson is tolerating her chemo regimen well. Recommend that she proceed with Herceptin today.  I reminded her about the use of Lucrezia Starch that will help with her diarrhea, as the imodium is ineffective. The labs were reviewed in detail with her and were stable.   Will check a urinalysis today to be sure she does not have urinary tract infection. I suspect that her left lower outer and intermittent abdominal pain was due to cramping from the diarrhea. However, she was instructed to call us if she has persistent abdominal pain for further instructions.  Brandy Jackson will return next week for trastuzumab alone. She understands and agrees with this plan. She knows a goal of treatment in her case is cure. She has been encouraged to call with any issues that mgiht arise before her next visit here.   Mikey Bussing, [place] [phone] 09/01/2014 3:58 PM

## 2014-09-01 NOTE — Telephone Encounter (Signed)
Message copied by Randolm Idol on Fri Sep 01, 2014  3:24 PM ------      Message from: Maryanna Shape      Created: Fri Sep 01, 2014  3:23 PM       Please call patient and let her know that her urinalysis does not show any signs of infection. ------

## 2014-09-02 LAB — URINE CULTURE

## 2014-09-08 ENCOUNTER — Other Ambulatory Visit: Payer: Self-pay | Admitting: Nurse Practitioner

## 2014-09-08 ENCOUNTER — Other Ambulatory Visit (HOSPITAL_BASED_OUTPATIENT_CLINIC_OR_DEPARTMENT_OTHER): Payer: BC Managed Care – PPO

## 2014-09-08 ENCOUNTER — Encounter: Payer: Self-pay | Admitting: Nurse Practitioner

## 2014-09-08 ENCOUNTER — Ambulatory Visit: Payer: BC Managed Care – PPO

## 2014-09-08 ENCOUNTER — Ambulatory Visit (HOSPITAL_BASED_OUTPATIENT_CLINIC_OR_DEPARTMENT_OTHER): Payer: BC Managed Care – PPO | Admitting: Nurse Practitioner

## 2014-09-08 ENCOUNTER — Ambulatory Visit (HOSPITAL_BASED_OUTPATIENT_CLINIC_OR_DEPARTMENT_OTHER): Payer: BC Managed Care – PPO

## 2014-09-08 VITALS — BP 121/74 | HR 72 | Temp 98.2°F | Resp 18 | Ht 67.0 in | Wt 211.8 lb

## 2014-09-08 DIAGNOSIS — C50411 Malignant neoplasm of upper-outer quadrant of right female breast: Secondary | ICD-10-CM

## 2014-09-08 DIAGNOSIS — Z170 Estrogen receptor positive status [ER+]: Secondary | ICD-10-CM

## 2014-09-08 DIAGNOSIS — Z5112 Encounter for antineoplastic immunotherapy: Secondary | ICD-10-CM

## 2014-09-08 LAB — CBC WITH DIFFERENTIAL/PLATELET
BASO%: 0.9 % (ref 0.0–2.0)
Basophils Absolute: 0.1 10*3/uL (ref 0.0–0.1)
EOS%: 0.2 % (ref 0.0–7.0)
Eosinophils Absolute: 0 10*3/uL (ref 0.0–0.5)
HCT: 35 % (ref 34.8–46.6)
HGB: 11.1 g/dL — ABNORMAL LOW (ref 11.6–15.9)
LYMPH#: 1.4 10*3/uL (ref 0.9–3.3)
LYMPH%: 16.5 % (ref 14.0–49.7)
MCH: 27.2 pg (ref 25.1–34.0)
MCHC: 31.9 g/dL (ref 31.5–36.0)
MCV: 85.4 fL (ref 79.5–101.0)
MONO#: 0.6 10*3/uL (ref 0.1–0.9)
MONO%: 6.7 % (ref 0.0–14.0)
NEUT#: 6.6 10*3/uL — ABNORMAL HIGH (ref 1.5–6.5)
NEUT%: 75.7 % (ref 38.4–76.8)
Platelets: 229 10*3/uL (ref 145–400)
RBC: 4.09 10*6/uL (ref 3.70–5.45)
RDW: 17.9 % — AB (ref 11.2–14.5)
WBC: 8.7 10*3/uL (ref 3.9–10.3)

## 2014-09-08 LAB — COMPREHENSIVE METABOLIC PANEL (CC13)
ALT: 18 U/L (ref 0–55)
AST: 17 U/L (ref 5–34)
Albumin: 3.3 g/dL — ABNORMAL LOW (ref 3.5–5.0)
Alkaline Phosphatase: 79 U/L (ref 40–150)
Anion Gap: 8 mEq/L (ref 3–11)
BUN: 12.5 mg/dL (ref 7.0–26.0)
CALCIUM: 9.1 mg/dL (ref 8.4–10.4)
CHLORIDE: 111 meq/L — AB (ref 98–109)
CO2: 23 mEq/L (ref 22–29)
CREATININE: 0.7 mg/dL (ref 0.6–1.1)
Glucose: 177 mg/dl — ABNORMAL HIGH (ref 70–140)
POTASSIUM: 4 meq/L (ref 3.5–5.1)
SODIUM: 142 meq/L (ref 136–145)
TOTAL PROTEIN: 6.4 g/dL (ref 6.4–8.3)
Total Bilirubin: 0.22 mg/dL (ref 0.20–1.20)

## 2014-09-08 MED ORDER — HEPARIN SOD (PORK) LOCK FLUSH 100 UNIT/ML IV SOLN
500.0000 [IU] | Freq: Once | INTRAVENOUS | Status: AC | PRN
  Administered 2014-09-08: 500 [IU]
  Filled 2014-09-08: qty 5

## 2014-09-08 MED ORDER — SODIUM CHLORIDE 0.9 % IJ SOLN
10.0000 mL | INTRAMUSCULAR | Status: DC | PRN
  Administered 2014-09-08: 10 mL
  Filled 2014-09-08: qty 10

## 2014-09-08 MED ORDER — ACETAMINOPHEN 325 MG PO TABS
ORAL_TABLET | ORAL | Status: AC
  Filled 2014-09-08: qty 2

## 2014-09-08 MED ORDER — SODIUM CHLORIDE 0.9 % IV SOLN
2.0000 mg/kg | Freq: Once | INTRAVENOUS | Status: AC
  Administered 2014-09-08: 189 mg via INTRAVENOUS
  Filled 2014-09-08: qty 9

## 2014-09-08 MED ORDER — SODIUM CHLORIDE 0.9 % IV SOLN
Freq: Once | INTRAVENOUS | Status: AC
  Administered 2014-09-08: 15:00:00 via INTRAVENOUS

## 2014-09-08 MED ORDER — ACETAMINOPHEN 325 MG PO TABS
650.0000 mg | ORAL_TABLET | Freq: Once | ORAL | Status: AC
  Administered 2014-09-08: 650 mg via ORAL

## 2014-09-08 MED ORDER — DIPHENHYDRAMINE HCL 25 MG PO CAPS
ORAL_CAPSULE | ORAL | Status: AC
  Filled 2014-09-08: qty 1

## 2014-09-08 MED ORDER — DIPHENHYDRAMINE HCL 25 MG PO CAPS
25.0000 mg | ORAL_CAPSULE | Freq: Once | ORAL | Status: AC
  Administered 2014-09-08: 25 mg via ORAL

## 2014-09-08 MED ORDER — INFLUENZA VAC SPLIT QUAD 0.5 ML IM SUSY
0.5000 mL | PREFILLED_SYRINGE | INTRAMUSCULAR | Status: DC
  Filled 2014-09-08: qty 0.5

## 2014-09-08 NOTE — Patient Instructions (Signed)
[facility] Discharge Instructions for Patients Receiving Chemotherapy  Today you received the following chemotherapy agents:  Herceptin  To help prevent nausea and vomiting after your treatment, we encourage you to take your nausea medication as ordered per MD.   If you develop nausea and vomiting that is not controlled by your nausea medication, call the clinic.   BELOW ARE SYMPTOMS THAT SHOULD BE REPORTED IMMEDIATELY:  *FEVER GREATER THAN 100.5 F  *CHILLS WITH OR WITHOUT FEVER  NAUSEA AND VOMITING THAT IS NOT CONTROLLED WITH YOUR NAUSEA MEDICATION  *UNUSUAL SHORTNESS OF BREATH  *UNUSUAL BRUISING OR BLEEDING  TENDERNESS IN MOUTH AND THROAT WITH OR WITHOUT PRESENCE OF ULCERS  *URINARY PROBLEMS  *BOWEL PROBLEMS  UNUSUAL RASH Items with * indicate a potential emergency and should be followed up as soon as possible.  Feel free to call the clinic you have any questions or concerns. The clinic phone number is [phone].    

## 2014-09-08 NOTE — Progress Notes (Signed)
ID: [name] OB: 05-26-1951  MR#: [id number]  MPN#:[id number]  PCP: Ricke Hey, MD GYN:   SU: Dr. Brantley Stage OTHER MD:  CHIEF COMPLAINT:  Right-sided breast cancer  CURRENT THERAPY: receiving adjuvant chemotherapy  BREAST CANCER HISTORY: From the original consult note:  "KALYSTA KNEISLEY is a 63 y.o. female. Patient palpated a right breast mass. She had a mammogram performed and she was noted to have a mass in the upper outer quadrant measuring 2.1 x 2.0 x 1.7 cm. 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 Tumor was ER positive PR positive HER-2/neu positive (triple positive) with a proliferation marker Ki-67 at 90%. She had MRI of the breasts performed which showed a solitary mass in the upper outer quadrant measuring 2.5 cm. Her case was discussed at the multidisciplinary breast conference."  Her subsequent treatment is as detailed below.   INTERVAL HISTORY:  Janesha returns today accompanied by her husband for follow up of her breast cancer. Today is day 15, cycle 5 of 6 planned cycles of carboplatin and docetaxel, given with neulasta on day 2 for granulocyte support. She is tolerating treatment well with few complains. She takes [place] daily PRN for loose stools. She had some swelling to her bilateral lower extremities earlier in the week but has elevated her limbs and cut back on salt and this is resolving.   REVIEW OF SYSTEMS:  Jocee denies fevers, chills, nausea, or vomiting. She has no shortness of breath, chest pain, cough, or fatigue. She has a great appetite, despite a mild taste perversion. Her energy is great and she is walking at least 1 mile per day on the treadmill.  Her bilateral toes are occasionally numb, but this is fleeting and unchanged. She is also starting to see hyperpigmentation to her palms. A detailed review of systems is otherwise negative.    PAST MEDICAL HISTORY: Past Medical History  Diagnosis Date  . Diabetes  mellitus without complication   . Arthritis   . Hot flashes   . Cancer   . Breast cancer   . Wears glasses   . Wears partial dentures     bottom    PAST SURGICAL HISTORY: Past Surgical History  Procedure Laterality Date  . Bladder repair w/ cesarean section    . Colonoscopy    . Portacath placement N/A 04/11/2014    Procedure: INSERTION PORT-A-CATH;  Surgeon: Joyice Faster. Cornett, MD;  Location: [place];  Service: General;  Laterality: N/A;  . Re-excision of breast lumpectomy Right 06/06/2014    Procedure: RE-EXCISION OF BREAST LUMPECTOMY;  Surgeon: Marcello Moores A. Cornett, MD;  Location: [place];  Service: General;  Laterality: Right;    FAMILY HISTORY No family history on file.  GYNECOLOGIC HISTORY: menarche at age 25, G36 P3, went through menopause at age 30.  No h/o of HRT, no h/o abnormal pap smears, or sexually transmitted infections.  Does have frequent vaginal candida infections due to diabetes that she follows with her gynecologist for.    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 Her husband [name] is retired from [employer]. He is a cancer survivor himself. The patient has 3 sons, one of whom is studying to be a pediatrician. All her children are married. She has 6 grandchildren.    ADVANCED DIRECTIVES: not in place.    HEALTH MAINTENANCE: History  Substance Use Topics  . Smoking status:  Former Smoker -- 1.00 packs/day    Quit date: 03/08/1986  . Smokeless tobacco: Never Used  . Alcohol Use: No      Mammogram: Colonoscopy: Bone Density Scan:  Pap Smear:  Eye Exam:  Vitamin D Level:   Lipid Panel:    Allergies  Allergen Reactions  . Sulfa Antibiotics Rash    Current Outpatient Prescriptions  Medication Sig Dispense Refill  . Loperamide HCl (IMODIUM PO) Take 1 tablet by mouth.      . metFORMIN (GLUCOPHAGE) 500 MG tablet Take 2 tablets (1,000 mg  total) by mouth 2 (two) times daily with a meal.  60 tablet  2  . cholestyramine (QUESTRAN) 4 G packet Take 1 packet (4 g total) by mouth 2 (two) times daily.  60 each  12  . dexamethasone (DECADRON) 4 MG tablet Take 2 tablets (8 mg total) by mouth 2 (two) times daily. Take the day before chemo, then the day after chemo x 3 days.  30 tablet  2  . lidocaine-prilocaine (EMLA) cream Apply topically as needed.  30 g  1  . ondansetron (ZOFRAN) 8 MG tablet Take 1 tablet (8 mg total) by mouth 2 (two) times daily. Take starting the day after chemo for three days, then take twice a day PRN.  20 tablet  1   Current Facility-Administered Medications  Medication Dose Route Frequency Provider Last Rate Last Dose  . [START ON 09/09/2014] Influenza vac split quadrivalent PF (FLUARIX) injection 0.5 mL  0.5 mL Intramuscular Tomorrow-1000  L Ferrell, NP        OBJECTIVE: Middle-aged African American woman who appears stated age Filed Vitals:   09/08/14 1427  BP: 121/74  Pulse: 72  Temp: 98.2 F (36.8 C)  Resp: 18     Body mass index is 33.16 kg/(m^2).      Skin: warm, dry  HEENT: sclerae anicteric, conjunctivae pink, oropharynx clear. No thrush or mucositis.  Lymph Nodes: No cervical or supraclavicular lymphadenopathy  Lungs: clear to auscultation bilaterally, no rales, wheezes, or rhonci  Heart: regular rate and rhythm  Abdomen: round, soft, non tender, positive bowel sounds  Musculoskeletal: No focal spinal tenderness, no peripheral edema  Neuro: non focal, well oriented, positive affect  Breasts: deferred  ECOG FS:1  LAB RESULTS:  CMP     Component Value Date/Time   NA 142 09/08/2014 1339   NA 142 04/07/2014 1320   K 4.0 09/08/2014 1339   K 4.2 04/07/2014 1320   CL 103 04/07/2014 1320   CO2 23 09/08/2014 1339   CO2 25 04/07/2014 1320   GLUCOSE 177* 09/08/2014 1339   GLUCOSE 126* 04/07/2014 1320   BUN 12.5 09/08/2014 1339   BUN 16 04/07/2014 1320   CREATININE 0.7 09/08/2014 1339   CREATININE  0.56 04/07/2014 1320   CALCIUM 9.1 09/08/2014 1339   CALCIUM 9.4 04/07/2014 1320   PROT 6.4 09/08/2014 1339   PROT 7.8 04/07/2014 1320   ALBUMIN 3.3* 09/08/2014 1339   ALBUMIN 3.9 04/07/2014 1320   AST 17 09/08/2014 1339   AST 20 04/07/2014 1320   ALT 18 09/08/2014 1339   ALT 19 04/07/2014 1320   ALKPHOS 79 09/08/2014 1339   ALKPHOS 68 04/07/2014 1320   BILITOT 0.22 09/08/2014 1339   BILITOT 0.2* 04/07/2014 1320   GFRNONAA >90 04/07/2014 1320   GFRAA >90 04/07/2014 1320    I No results found for this basename: SPEP,  UPEP,   kappa and lambda light chains    Lab Results    Component Value Date   WBC 8.7 09/08/2014   NEUTROABS 6.6* 09/08/2014   HGB 11.1* 09/08/2014   HCT 35.0 09/08/2014   MCV 85.4 09/08/2014   PLT 229 09/08/2014      Chemistry      Component Value Date/Time   NA 142 09/08/2014 1339   NA 142 04/07/2014 1320   K 4.0 09/08/2014 1339   K 4.2 04/07/2014 1320   CL 103 04/07/2014 1320   CO2 23 09/08/2014 1339   CO2 25 04/07/2014 1320   BUN 12.5 09/08/2014 1339   BUN 16 04/07/2014 1320   CREATININE 0.7 09/08/2014 1339   CREATININE 0.56 04/07/2014 1320      Component Value Date/Time   CALCIUM 9.1 09/08/2014 1339   CALCIUM 9.4 04/07/2014 1320   ALKPHOS 79 09/08/2014 1339   ALKPHOS 68 04/07/2014 1320   AST 17 09/08/2014 1339   AST 20 04/07/2014 1320   ALT 18 09/08/2014 1339   ALT 19 04/07/2014 1320   BILITOT 0.22 09/08/2014 1339   BILITOT 0.2* 04/07/2014 1320       No results found for this basename: LABCA2    No components found with this basename: LABCA125    No results found for this basename: INR,  in the last 168 hours  Urinalysis No results found for this basename: colorurine,  appearanceur,  labspec,  phurine,  glucoseu,  hgbur,  bilirubinur,  ketonesur,  proteinur,  urobilinogen,  nitrite,  leukocytesur    STUDIES: No results found.  ASSESSMENT: 63 y.o. [place],woman status post right lumpectomy and sentinel lymph node sampling 04/11/2014 for a pT2 pN1, stage IIB invasive ductal  carcinoma, grade 3, estrogen receptor strongly positive, progesterone receptor negative, with an MIB-1 of 94% and HER-2 amplification  (1) additional surgery in 06/06/2014 for margin clearance showed no evidence of residual malignancy  (2) received the first of six planned cycles of adjuvant chemotherapy with carboplatin docetaxel and trastuzumab 05/02/2014, with chemotherapy interrupted because of her intervening surgery; chemo to be resumed 06/26/2014  (3) continuing trastuzumab through May 2016 to complete 1 year, most recent echocardiogram 04/05/2014 showed a normal ejection fraction  (4) the patient will need adjuvant radiation at the completion of chemotherapy  (5) she will start antiestrogen therapy at the completion of radiation  PLAN:  Tammy continues to tolerate treatment well. The labs were reviewed in detail and were stable. Her last echo was at the end of August and showed a well preserved EF. She will proceed with trastuzumab today.   Sylver will return next week for the start of her final cycle of carboplatin and docetaxel. She understands and agrees with this plan. She knows the goal of treatment in her case is cure. She has been encouraged to call with any issues that might arise before her next visit here.   Marcelino Duster, [place] [phone] 09/08/2014 2:48 PM

## 2014-09-13 ENCOUNTER — Telehealth: Payer: Self-pay | Admitting: Oncology

## 2014-09-13 NOTE — Telephone Encounter (Signed)
RECEIVED CALL FROM [facility] [facility] PATIENT NO SHOW FOR APPOINTMENT WILL INFORM DESK NURSE

## 2014-09-15 ENCOUNTER — Ambulatory Visit (HOSPITAL_BASED_OUTPATIENT_CLINIC_OR_DEPARTMENT_OTHER): Payer: BC Managed Care – PPO | Admitting: Nurse Practitioner

## 2014-09-15 ENCOUNTER — Other Ambulatory Visit: Payer: Self-pay | Admitting: Hematology and Oncology

## 2014-09-15 ENCOUNTER — Ambulatory Visit (HOSPITAL_BASED_OUTPATIENT_CLINIC_OR_DEPARTMENT_OTHER): Payer: BC Managed Care – PPO

## 2014-09-15 ENCOUNTER — Other Ambulatory Visit (HOSPITAL_BASED_OUTPATIENT_CLINIC_OR_DEPARTMENT_OTHER): Payer: BC Managed Care – PPO

## 2014-09-15 ENCOUNTER — Encounter: Payer: Self-pay | Admitting: Nurse Practitioner

## 2014-09-15 ENCOUNTER — Other Ambulatory Visit: Payer: BC Managed Care – PPO

## 2014-09-15 VITALS — BP 131/54 | HR 87 | Temp 98.5°F | Resp 20 | Ht 67.0 in | Wt 212.8 lb

## 2014-09-15 DIAGNOSIS — D630 Anemia in neoplastic disease: Secondary | ICD-10-CM | POA: Insufficient documentation

## 2014-09-15 DIAGNOSIS — Z452 Encounter for adjustment and management of vascular access device: Secondary | ICD-10-CM

## 2014-09-15 DIAGNOSIS — Z5111 Encounter for antineoplastic chemotherapy: Secondary | ICD-10-CM

## 2014-09-15 DIAGNOSIS — Z5112 Encounter for antineoplastic immunotherapy: Secondary | ICD-10-CM

## 2014-09-15 DIAGNOSIS — C50411 Malignant neoplasm of upper-outer quadrant of right female breast: Secondary | ICD-10-CM

## 2014-09-15 DIAGNOSIS — D649 Anemia, unspecified: Secondary | ICD-10-CM

## 2014-09-15 DIAGNOSIS — R600 Localized edema: Secondary | ICD-10-CM

## 2014-09-15 DIAGNOSIS — E119 Type 2 diabetes mellitus without complications: Secondary | ICD-10-CM

## 2014-09-15 HISTORY — DX: Anemia in neoplastic disease: D63.0

## 2014-09-15 HISTORY — DX: Localized edema: R60.0

## 2014-09-15 LAB — COMPREHENSIVE METABOLIC PANEL (CC13)
ALT: 15 U/L (ref 0–55)
AST: 13 U/L (ref 5–34)
Albumin: 3.5 g/dL (ref 3.5–5.0)
Alkaline Phosphatase: 58 U/L (ref 40–150)
Anion Gap: 6 mEq/L (ref 3–11)
BILIRUBIN TOTAL: 0.24 mg/dL (ref 0.20–1.20)
BUN: 10.9 mg/dL (ref 7.0–26.0)
CO2: 27 meq/L (ref 22–29)
Calcium: 9.4 mg/dL (ref 8.4–10.4)
Chloride: 111 mEq/L — ABNORMAL HIGH (ref 98–109)
Creatinine: 0.7 mg/dL (ref 0.6–1.1)
Glucose: 202 mg/dl — ABNORMAL HIGH (ref 70–140)
Potassium: 3.4 mEq/L — ABNORMAL LOW (ref 3.5–5.1)
Sodium: 144 mEq/L (ref 136–145)
Total Protein: 6.4 g/dL (ref 6.4–8.3)

## 2014-09-15 LAB — CBC WITH DIFFERENTIAL/PLATELET
BASO%: 0.6 % (ref 0.0–2.0)
BASOS ABS: 0.1 10*3/uL (ref 0.0–0.1)
EOS%: 0 % (ref 0.0–7.0)
Eosinophils Absolute: 0 10*3/uL (ref 0.0–0.5)
HEMATOCRIT: 32.7 % — AB (ref 34.8–46.6)
HEMOGLOBIN: 10.4 g/dL — AB (ref 11.6–15.9)
LYMPH%: 14.9 % (ref 14.0–49.7)
MCH: 27.5 pg (ref 25.1–34.0)
MCHC: 31.8 g/dL (ref 31.5–36.0)
MCV: 86.5 fL (ref 79.5–101.0)
MONO#: 0.6 10*3/uL (ref 0.1–0.9)
MONO%: 6.1 % (ref 0.0–14.0)
NEUT#: 7.4 10*3/uL — ABNORMAL HIGH (ref 1.5–6.5)
NEUT%: 78.4 % — ABNORMAL HIGH (ref 38.4–76.8)
PLATELETS: 238 10*3/uL (ref 145–400)
RBC: 3.78 10*6/uL (ref 3.70–5.45)
RDW: 17.9 % — ABNORMAL HIGH (ref 11.2–14.5)
WBC: 9.4 10*3/uL (ref 3.9–10.3)
lymph#: 1.4 10*3/uL (ref 0.9–3.3)

## 2014-09-15 MED ORDER — DIPHENHYDRAMINE HCL 25 MG PO CAPS
ORAL_CAPSULE | ORAL | Status: AC
Start: 2014-09-15 — End: 2014-09-15
  Filled 2014-09-15: qty 1

## 2014-09-15 MED ORDER — TRASTUZUMAB CHEMO INJECTION 440 MG
2.0000 mg/kg | Freq: Once | INTRAVENOUS | Status: AC
  Administered 2014-09-15: 189 mg via INTRAVENOUS
  Filled 2014-09-15: qty 9

## 2014-09-15 MED ORDER — SODIUM CHLORIDE 0.9 % IV SOLN
Freq: Once | INTRAVENOUS | Status: AC
Start: 2014-09-15 — End: 2014-09-15
  Administered 2014-09-15: 14:00:00 via INTRAVENOUS

## 2014-09-15 MED ORDER — ALTEPLASE 2 MG IJ SOLR
2.0000 mg | Freq: Once | INTRAMUSCULAR | Status: AC | PRN
  Administered 2014-09-15: 2 mg
  Filled 2014-09-15: qty 2

## 2014-09-15 MED ORDER — ONDANSETRON 16 MG/50ML IVPB (CHCC)
INTRAVENOUS | Status: AC
  Filled 2014-09-15: qty 16

## 2014-09-15 MED ORDER — DOCETAXEL CHEMO INJECTION 160 MG/16ML
75.0000 mg/m2 | Freq: Once | INTRAVENOUS | Status: AC
  Administered 2014-09-15: 160 mg via INTRAVENOUS
  Filled 2014-09-15: qty 16

## 2014-09-15 MED ORDER — DEXAMETHASONE SODIUM PHOSPHATE 20 MG/5ML IJ SOLN
20.0000 mg | Freq: Once | INTRAMUSCULAR | Status: AC
  Administered 2014-09-15: 20 mg via INTRAVENOUS

## 2014-09-15 MED ORDER — ONDANSETRON 16 MG/50ML IVPB (CHCC)
16.0000 mg | Freq: Once | INTRAVENOUS | Status: AC
  Administered 2014-09-15: 16 mg via INTRAVENOUS

## 2014-09-15 MED ORDER — METFORMIN HCL 500 MG PO TABS: 1000.0000 mg | ORAL_TABLET | Freq: Two times a day (BID) | ORAL | Status: DC

## 2014-09-15 MED ORDER — DEXAMETHASONE SODIUM PHOSPHATE 20 MG/5ML IJ SOLN
INTRAMUSCULAR | Status: AC
  Filled 2014-09-15: qty 5

## 2014-09-15 MED ORDER — ACETAMINOPHEN 325 MG PO TABS
650.0000 mg | ORAL_TABLET | Freq: Once | ORAL | Status: AC
  Administered 2014-09-15: 650 mg via ORAL

## 2014-09-15 MED ORDER — HEPARIN SOD (PORK) LOCK FLUSH 100 UNIT/ML IV SOLN
500.0000 [IU] | Freq: Once | INTRAVENOUS | Status: AC | PRN
  Filled 2014-09-15: qty 5

## 2014-09-15 MED ORDER — ACETAMINOPHEN 325 MG PO TABS
ORAL_TABLET | ORAL | Status: AC
  Filled 2014-09-15: qty 2

## 2014-09-15 MED ORDER — DIPHENHYDRAMINE HCL 25 MG PO CAPS
25.0000 mg | ORAL_CAPSULE | Freq: Once | ORAL | Status: AC
  Administered 2014-09-15: 25 mg via ORAL

## 2014-09-15 MED ORDER — SODIUM CHLORIDE 0.9 % IV SOLN
714.0000 mg | Freq: Once | INTRAVENOUS | Status: AC
  Administered 2014-09-15: 710 mg via INTRAVENOUS
  Filled 2014-09-15: qty 71

## 2014-09-15 MED ORDER — SODIUM CHLORIDE 0.9 % IJ SOLN
10.0000 mL | INTRAMUSCULAR | Status: DC | PRN
  Filled 2014-09-15: qty 10

## 2014-09-15 NOTE — Progress Notes (Signed)
ID: [name] OB: 1951-08-10  MR#: [id number]  FTD#:[id number]  PCP: Ricke Hey, MD GYN:   SU: Dr. Brantley Stage OTHER MD:  CHIEF COMPLAINT:  Right-sided breast cancer  CURRENT THERAPY: receiving adjuvant chemotherapy  BREAST CANCER HISTORY: From the original consult note:  "Brandy Jackson is a 63 y.o. female. Patient palpated a right breast mass. She had a mammogram performed and she was noted to have a mass in the upper outer quadrant measuring 2.1 x 2.0 x 1.7 cm. 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 Tumor was ER positive PR positive HER-2/neu positive (triple positive) with a proliferation marker Ki-67 at 90%. She had MRI of the breasts performed which showed a solitary mass in the upper outer quadrant measuring 2.5 cm. Her case was discussed at the multidisciplinary breast conference."  Her subsequent treatment is as detailed below.   INTERVAL HISTORY:  [name] returns today accompanied by her husband for follow up of her breast cancer. Today is day 1, cycle 6 of 6 planned cycles of carboplatin and docetaxel, given with neulasta on day 2 for granulocyte support. She also gets trastuzumab weekly while on this regimen. She is tolerating treatments well with a few complaints. She takes [place] daily for loose stools. The swelling to her bilateral ankles has returned, but does go away quickly with elevation. Ocassionally her toes go numb, but this resolves with rest and massage.   REVIEW OF SYSTEMS:  A detailed review of systems is otherwise noncontributory, except where noted above.   PAST MEDICAL HISTORY: Past Medical History  Diagnosis Date  . Diabetes mellitus without complication   . Arthritis   . Hot flashes   . Cancer   . Breast cancer   . Wears glasses   . Wears partial dentures     bottom    PAST SURGICAL HISTORY: Past Surgical History  Procedure Laterality Date  . Bladder repair w/ cesarean section    . Colonoscopy    .  Portacath placement N/A 04/11/2014    Procedure: INSERTION PORT-A-CATH;  Surgeon: Joyice Faster. Cornett, MD;  Location: [place];  Service: General;  Laterality: N/A;  . Re-excision of breast lumpectomy Right 06/06/2014    Procedure: RE-EXCISION OF BREAST LUMPECTOMY;  Surgeon: Marcello Moores A. Cornett, MD;  Location: [place];  Service: General;  Laterality: Right;    FAMILY HISTORY No family history on file.  GYNECOLOGIC HISTORY: menarche at age 62, G67 P3, went through menopause at age 80.  No h/o of HRT, no h/o abnormal pap smears, or sexually transmitted infections.  Does have frequent vaginal candida infections due to diabetes that she follows with her gynecologist for.    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 Her husband Brandy Jackson is retired from [employer]. He is a cancer survivor himself. The patient has 3 sons, one of whom is studying to be a pediatrician. All her children are married. She has 6 grandchildren.    ADVANCED DIRECTIVES: not in place.    HEALTH MAINTENANCE: History  Substance Use Topics  . Smoking status: Former Smoker -- 1.00 packs/day    Quit date: 03/08/1986  . Smokeless tobacco: Never Used  . Alcohol Use: No     Colonoscopy: Bone Density Scan:  Pap Smear:   Vitamin D Level:   Lipid Panel:    Allergies  Allergen Reactions  . Sulfa Antibiotics Rash  Current Outpatient Prescriptions  Medication Sig Dispense Refill  . dexamethasone (DECADRON) 4 MG tablet Take 2 tablets (8 mg total) by mouth 2 (two) times daily. Take the day before chemo, then the day after chemo x 3 days.  30 tablet  2  . lidocaine-prilocaine (EMLA) cream Apply topically as needed.  30 g  1  . Loperamide HCl (IMODIUM PO) Take 1 tablet by mouth.      . metFORMIN (GLUCOPHAGE) 500 MG tablet Take 2 tablets (1,000 mg total) by mouth 2 (two) times daily with a meal.  120 tablet  11  .  ondansetron (ZOFRAN) 8 MG tablet Take 1 tablet (8 mg total) by mouth 2 (two) times daily. Take starting the day after chemo for three days, then take twice a day PRN.  20 tablet  1  . cholestyramine (QUESTRAN) 4 G packet Take 1 packet (4 g total) by mouth 2 (two) times daily.  60 each  12   No current facility-administered medications for this visit.    OBJECTIVE: Middle-aged [place] American woman who appears stated age 30 Vitals:   09/15/14 1114  BP: 131/54  Pulse: 87  Temp: 98.5 F (36.9 C)  Resp: 20     Body mass index is 33.32 kg/(m^2).      Sclerae unicteric, pupils equal and reactive Oropharynx clear and moist-- no thrush No cervical or supraclavicular adenopathy Lungs no rales or rhonchi Heart regular rate and rhythm Abd soft, nontender, positive bowel sounds MSK no focal spinal tenderness, no upper extremity lymphedema Neuro: nonfocal, well oriented, appropriate affect Breasts: deferred  ECOG FS:1  LAB RESULTS:  CMP     Component Value Date/Time   NA 144 09/15/2014 1057   NA 142 04/07/2014 1320   K 3.4* 09/15/2014 1057   K 4.2 04/07/2014 1320   CL 103 04/07/2014 1320   CO2 27 09/15/2014 1057   CO2 25 04/07/2014 1320   GLUCOSE 202* 09/15/2014 1057   GLUCOSE 126* 04/07/2014 1320   BUN 10.9 09/15/2014 1057   BUN 16 04/07/2014 1320   CREATININE 0.7 09/15/2014 1057   CREATININE 0.56 04/07/2014 1320   CALCIUM 9.4 09/15/2014 1057   CALCIUM 9.4 04/07/2014 1320   PROT 6.4 09/15/2014 1057   PROT 7.8 04/07/2014 1320   ALBUMIN 3.5 09/15/2014 1057   ALBUMIN 3.9 04/07/2014 1320   AST 13 09/15/2014 1057   AST 20 04/07/2014 1320   ALT 15 09/15/2014 1057   ALT 19 04/07/2014 1320   ALKPHOS 58 09/15/2014 1057   ALKPHOS 68 04/07/2014 1320   BILITOT 0.24 09/15/2014 1057   BILITOT 0.2* 04/07/2014 1320   GFRNONAA >90 04/07/2014 1320   GFRAA >90 04/07/2014 1320    I No results found for this basename: SPEP,  UPEP,   kappa and lambda light chains    Lab Results  Component Value Date   WBC 9.4 09/15/2014    NEUTROABS 7.4* 09/15/2014   HGB 10.4* 09/15/2014   HCT 32.7* 09/15/2014   MCV 86.5 09/15/2014   PLT 238 09/15/2014      Chemistry      Component Value Date/Time   NA 144 09/15/2014 1057   NA 142 04/07/2014 1320   K 3.4* 09/15/2014 1057   K 4.2 04/07/2014 1320   CL 103 04/07/2014 1320   CO2 27 09/15/2014 1057   CO2 25 04/07/2014 1320   BUN 10.9 09/15/2014 1057   BUN 16 04/07/2014 1320   CREATININE 0.7 09/15/2014 1057   CREATININE 0.56 04/07/2014  1320      Component Value Date/Time   CALCIUM 9.4 09/15/2014 1057   CALCIUM 9.4 04/07/2014 1320   ALKPHOS 58 09/15/2014 1057   ALKPHOS 68 04/07/2014 1320   AST 13 09/15/2014 1057   AST 20 04/07/2014 1320   ALT 15 09/15/2014 1057   ALT 19 04/07/2014 1320   BILITOT 0.24 09/15/2014 1057   BILITOT 0.2* 04/07/2014 1320       No results found for this basename: LABCA2    No components found with this basename: LABCA125    No results found for this basename: INR,  in the last 168 hours  Urinalysis No results found for this basename: colorurine,  appearanceur,  labspec,  phurine,  glucoseu,  hgbur,  bilirubinur,  ketonesur,  proteinur,  urobilinogen,  nitrite,  leukocytesur    STUDIES: Most recent echocardiogram on 07/03/14  showed an ejection fraction of 60-65%  ASSESSMENT: 63 y.o. [place],woman status post right lumpectomy and sentinel lymph node sampling 04/11/2014 for a pT2 pN1, stage IIB invasive ductal carcinoma, grade 3, estrogen receptor strongly positive, progesterone receptor negative, with an MIB-1 of 94% and HER-2 amplification  (1) additional surgery in 06/06/2014 for margin clearance showed no evidence of residual malignancy  (2) received the first of six planned cycles of adjuvant chemotherapy with carboplatin docetaxel and trastuzumab 05/02/2014, with chemotherapy interrupted because of her intervening surgery; chemo to be resumed 06/26/2014  (3) continuing trastuzumab through May 2016 to complete 1 year  (4) the patient will need  adjuvant radiation at the completion of chemotherapy  (5) she will start antiestrogen therapy at the completion of radiation  PLAN:  Esthela continues to perform well with treatment. The labs were reviewed in detail, she has some treatment related anemia, but she is asymptomatic. Her glucose is elevated today, but this happens while she takes dexamethasone at home in preparation for chemo the day prior to treatment. I have refilled her metformin during this visit.   We will proceed with her 6th and final treatment of carboplatin and docetaxel along with trastuzumab today and the next 2 weeks. She will then continue trastuzuamb every 3 weeks to complete a year of anti-HER2 therapy. Her next echocardiogram is planned for 10/09/14.  I am referring her to radiation oncology for her initial visit in the upcoming weeks. She will start antiestrogen therapy after the completion of radiation.   Elide's next visit in this office will be next Friday. She understands and agrees with this plan. She knows the goal of treatment in her case is cure. She has been encouraged to call with any issues that might arise before her next visit here.   Marcelino Duster, [place] [phone] 09/15/2014 12:07 PM

## 2014-09-16 ENCOUNTER — Ambulatory Visit (HOSPITAL_BASED_OUTPATIENT_CLINIC_OR_DEPARTMENT_OTHER): Payer: BC Managed Care – PPO

## 2014-09-16 VITALS — BP 130/49 | HR 84 | Temp 98.6°F | Resp 20

## 2014-09-16 DIAGNOSIS — Z5189 Encounter for other specified aftercare: Secondary | ICD-10-CM

## 2014-09-16 DIAGNOSIS — C50411 Malignant neoplasm of upper-outer quadrant of right female breast: Secondary | ICD-10-CM

## 2014-09-16 MED ORDER — PEGFILGRASTIM INJECTION 6 MG/0.6ML
6.0000 mg | Freq: Once | SUBCUTANEOUS | Status: AC
  Administered 2014-09-16: 6 mg via SUBCUTANEOUS

## 2014-09-16 NOTE — Patient Instructions (Signed)
Pegfilgrastim injection What is this medicine? PEGFILGRASTIM (peg fil GRA stim) is a long-acting granulocyte colony-stimulating factor that stimulates the growth of neutrophils, a type of white blood cell important in the body's fight against infection. It is used to reduce the incidence of fever and infection in patients with certain types of cancer who are receiving chemotherapy that affects the bone marrow. This medicine may be used for other purposes; ask your health care provider or pharmacist if you have questions. COMMON BRAND NAME(S): Neulasta What should I tell my health care provider before I take this medicine? They need to know if you have any of these conditions: -latex allergy -ongoing radiation therapy -sickle cell disease -skin reactions to acrylic adhesives (On-Body Injector only) -an unusual or allergic reaction to pegfilgrastim, filgrastim, other medicines, foods, dyes, or preservatives -pregnant or trying to get pregnant -breast-feeding How should I use this medicine? This medicine is for injection under the skin. If you get this medicine at home, you will be taught how to prepare and give the pre-filled syringe or how to use the On-body Injector. Refer to the patient Instructions for Use for detailed instructions. Use exactly as directed. Take your medicine at regular intervals. Do not take your medicine more often than directed. It is important that you put your used needles and syringes in a special sharps container. Do not put them in a trash can. If you do not have a sharps container, call your pharmacist or healthcare provider to get one. Talk to your pediatrician regarding the use of this medicine in children. Special care may be needed. Overdosage: If you think you have taken too much of this medicine contact a poison control center or emergency room at once. NOTE: This medicine is only for you. Do not share this medicine with others. What if I miss a dose? It is  important not to miss your dose. Call your doctor or health care professional if you miss your dose. If you miss a dose due to an On-body Injector failure or leakage, a new dose should be administered as soon as possible using a single prefilled syringe for manual use. What may interact with this medicine? Interactions have not been studied. Give your health care provider a list of all the medicines, herbs, non-prescription drugs, or dietary supplements you use. Also tell them if you smoke, drink alcohol, or use illegal drugs. Some items may interact with your medicine. This list may not describe all possible interactions. Give your health care provider a list of all the medicines, herbs, non-prescription drugs, or dietary supplements you use. Also tell them if you smoke, drink alcohol, or use illegal drugs. Some items may interact with your medicine. What should I watch for while using this medicine? You may need blood work done while you are taking this medicine. If you are going to need a MRI, CT scan, or other procedure, tell your doctor that you are using this medicine (On-Body Injector only). What side effects may I notice from receiving this medicine? Side effects that you should report to your doctor or health care professional as soon as possible: -allergic reactions like skin rash, itching or hives, swelling of the face, lips, or tongue -dizziness -fever -pain, redness, or irritation at site where injected -pinpoint red spots on the skin -shortness of breath or breathing problems -stomach or side pain, or pain at the shoulder -swelling -tiredness -trouble passing urine Side effects that usually do not require medical attention (report to your doctor   or health care professional if they continue or are bothersome): -bone pain -muscle pain This list may not describe all possible side effects. Call your doctor for medical advice about side effects. You may report side effects to FDA at  1-800-FDA-1088. Where should I keep my medicine? Keep out of the reach of children. Store pre-filled syringes in a refrigerator between 2 and 8 degrees C (36 and 46 degrees F). Do not freeze. Keep in carton to protect from light. Throw away this medicine if it is left out of the refrigerator for more than 48 hours. Throw away any unused medicine after the expiration date. NOTE: This sheet is a summary. It may not cover all possible information. If you have questions about this medicine, talk to your doctor, pharmacist, or health care provider.  2015, Elsevier/Gold Standard. (2014-02-23 16:14:05)  

## 2014-09-18 ENCOUNTER — Telehealth: Payer: Self-pay | Admitting: Oncology

## 2014-09-20 ENCOUNTER — Ambulatory Visit: Admission: RE | Admit: 2014-09-20 | Payer: BC Managed Care – PPO | Source: Ambulatory Visit

## 2014-09-20 ENCOUNTER — Ambulatory Visit
Admission: RE | Admit: 2014-09-20 | Discharge: 2014-09-20 | Disposition: A | Discharge status: Home / Self Care | Payer: BC Managed Care – PPO | Source: Ambulatory Visit | Attending: Radiation Oncology | Admitting: Radiation Oncology

## 2014-09-20 DIAGNOSIS — L599 Disorder of the skin and subcutaneous tissue related to radiation, unspecified: Secondary | ICD-10-CM | POA: Insufficient documentation

## 2014-09-20 DIAGNOSIS — Z9221 Personal history of antineoplastic chemotherapy: Secondary | ICD-10-CM | POA: Insufficient documentation

## 2014-09-20 DIAGNOSIS — C50411 Malignant neoplasm of upper-outer quadrant of right female breast: Secondary | ICD-10-CM | POA: Insufficient documentation

## 2014-09-20 DIAGNOSIS — Z79899 Other long term (current) drug therapy: Secondary | ICD-10-CM | POA: Insufficient documentation

## 2014-09-20 DIAGNOSIS — Z510 Encounter for antineoplastic radiation therapy: Secondary | ICD-10-CM | POA: Insufficient documentation

## 2014-09-22 ENCOUNTER — Encounter: Payer: Self-pay | Admitting: Oncology

## 2014-09-22 ENCOUNTER — Ambulatory Visit: Payer: BC Managed Care – PPO

## 2014-09-22 ENCOUNTER — Ambulatory Visit (HOSPITAL_BASED_OUTPATIENT_CLINIC_OR_DEPARTMENT_OTHER): Payer: BC Managed Care – PPO

## 2014-09-22 ENCOUNTER — Other Ambulatory Visit (HOSPITAL_BASED_OUTPATIENT_CLINIC_OR_DEPARTMENT_OTHER): Payer: BC Managed Care – PPO

## 2014-09-22 ENCOUNTER — Other Ambulatory Visit: Payer: Self-pay | Admitting: Oncology

## 2014-09-22 ENCOUNTER — Ambulatory Visit (HOSPITAL_BASED_OUTPATIENT_CLINIC_OR_DEPARTMENT_OTHER): Payer: BC Managed Care – PPO | Admitting: Oncology

## 2014-09-22 VITALS — BP 131/88 | HR 85 | Temp 98.3°F | Resp 18 | Ht 67.0 in | Wt 209.4 lb

## 2014-09-22 DIAGNOSIS — Z95828 Presence of other vascular implants and grafts: Secondary | ICD-10-CM

## 2014-09-22 DIAGNOSIS — C50411 Malignant neoplasm of upper-outer quadrant of right female breast: Secondary | ICD-10-CM

## 2014-09-22 DIAGNOSIS — Z5112 Encounter for antineoplastic immunotherapy: Secondary | ICD-10-CM

## 2014-09-22 DIAGNOSIS — Z170 Estrogen receptor positive status [ER+]: Secondary | ICD-10-CM

## 2014-09-22 LAB — COMPREHENSIVE METABOLIC PANEL (CC13)
ALT: 19 U/L (ref 0–55)
ANION GAP: 11 meq/L (ref 3–11)
AST: 14 U/L (ref 5–34)
Albumin: 3.6 g/dL (ref 3.5–5.0)
Alkaline Phosphatase: 85 U/L (ref 40–150)
BILIRUBIN TOTAL: 0.21 mg/dL (ref 0.20–1.20)
BUN: 15.1 mg/dL (ref 7.0–26.0)
CALCIUM: 9.3 mg/dL (ref 8.4–10.4)
CO2: 23 meq/L (ref 22–29)
Chloride: 108 mEq/L (ref 98–109)
Creatinine: 0.7 mg/dL (ref 0.6–1.1)
GLUCOSE: 165 mg/dL — AB (ref 70–140)
Potassium: 3.7 mEq/L (ref 3.5–5.1)
Sodium: 142 mEq/L (ref 136–145)
TOTAL PROTEIN: 6.5 g/dL (ref 6.4–8.3)

## 2014-09-22 LAB — CBC WITH DIFFERENTIAL/PLATELET
BASO%: 0.6 % (ref 0.0–2.0)
Basophils Absolute: 0.1 10*3/uL (ref 0.0–0.1)
EOS%: 0.4 % (ref 0.0–7.0)
Eosinophils Absolute: 0 10*3/uL (ref 0.0–0.5)
HEMATOCRIT: 34.3 % — AB (ref 34.8–46.6)
HGB: 10.8 g/dL — ABNORMAL LOW (ref 11.6–15.9)
LYMPH%: 20.2 % (ref 14.0–49.7)
MCH: 27.4 pg (ref 25.1–34.0)
MCHC: 31.6 g/dL (ref 31.5–36.0)
MCV: 86.8 fL (ref 79.5–101.0)
MONO#: 0.8 10*3/uL (ref 0.1–0.9)
MONO%: 8.3 % (ref 0.0–14.0)
NEUT#: 7 10*3/uL — ABNORMAL HIGH (ref 1.5–6.5)
NEUT%: 70.5 % (ref 38.4–76.8)
PLATELETS: 212 10*3/uL (ref 145–400)
RBC: 3.95 10*6/uL (ref 3.70–5.45)
RDW: 16.9 % — ABNORMAL HIGH (ref 11.2–14.5)
WBC: 9.9 10*3/uL (ref 3.9–10.3)
lymph#: 2 10*3/uL (ref 0.9–3.3)

## 2014-09-22 MED ORDER — SODIUM CHLORIDE 0.9 % IJ SOLN
10.0000 mL | INTRAMUSCULAR | Status: DC | PRN
  Administered 2014-09-22: 10 mL via INTRAVENOUS
  Filled 2014-09-22: qty 10

## 2014-09-22 MED ORDER — HEPARIN SOD (PORK) LOCK FLUSH 100 UNIT/ML IV SOLN
500.0000 [IU] | Freq: Once | INTRAVENOUS | Status: AC | PRN
  Administered 2014-09-22: 500 [IU]
  Filled 2014-09-22: qty 5

## 2014-09-22 MED ORDER — DIPHENHYDRAMINE HCL 25 MG PO CAPS
ORAL_CAPSULE | ORAL | Status: AC
  Filled 2014-09-22: qty 1

## 2014-09-22 MED ORDER — ACETAMINOPHEN 325 MG PO TABS
650.0000 mg | ORAL_TABLET | Freq: Once | ORAL | Status: AC
  Administered 2014-09-22: 650 mg via ORAL

## 2014-09-22 MED ORDER — ACETAMINOPHEN 325 MG PO TABS
ORAL_TABLET | ORAL | Status: AC
  Filled 2014-09-22: qty 2

## 2014-09-22 MED ORDER — SODIUM CHLORIDE 0.9 % IV SOLN
Freq: Once | INTRAVENOUS | Status: AC
  Administered 2014-09-22: 15:00:00 via INTRAVENOUS

## 2014-09-22 MED ORDER — ONDANSETRON HCL 8 MG PO TABS: 8.0000 mg | ORAL_TABLET | Freq: Two times a day (BID) | ORAL | Status: DC

## 2014-09-22 MED ORDER — TRASTUZUMAB CHEMO INJECTION 440 MG
2.0000 mg/kg | Freq: Once | INTRAVENOUS | Status: AC
  Administered 2014-09-22: 189 mg via INTRAVENOUS
  Filled 2014-09-22: qty 9

## 2014-09-22 MED ORDER — SODIUM CHLORIDE 0.9 % IJ SOLN
10.0000 mL | INTRAMUSCULAR | Status: DC | PRN
  Administered 2014-09-22: 10 mL
  Filled 2014-09-22: qty 10

## 2014-09-22 MED ORDER — DIPHENHYDRAMINE HCL 25 MG PO CAPS
25.0000 mg | ORAL_CAPSULE | Freq: Once | ORAL | Status: AC
  Administered 2014-09-22: 25 mg via ORAL

## 2014-09-22 NOTE — Patient Instructions (Signed)
[facility] Discharge Instructions for Patients Receiving Chemotherapy  Today you received the following chemotherapy agents:  Herceptin  To help prevent nausea and vomiting after your treatment, we encourage you to take your nausea medication as ordered per MD.   If you develop nausea and vomiting that is not controlled by your nausea medication, call the clinic.   BELOW ARE SYMPTOMS THAT SHOULD BE REPORTED IMMEDIATELY:  *FEVER GREATER THAN 100.5 F  *CHILLS WITH OR WITHOUT FEVER  NAUSEA AND VOMITING THAT IS NOT CONTROLLED WITH YOUR NAUSEA MEDICATION  *UNUSUAL SHORTNESS OF BREATH  *UNUSUAL BRUISING OR BLEEDING  TENDERNESS IN MOUTH AND THROAT WITH OR WITHOUT PRESENCE OF ULCERS  *URINARY PROBLEMS  *BOWEL PROBLEMS  UNUSUAL RASH Items with * indicate a potential emergency and should be followed up as soon as possible.  Feel free to call the clinic you have any questions or concerns. The clinic phone number is [phone].    

## 2014-09-22 NOTE — Patient Instructions (Signed)

## 2014-09-22 NOTE — Progress Notes (Signed)
ID: Brandy Jackson OB: 10-Nov-1951  MR#: [id number]  LNL#:[id number]  PCP: Ricke Hey, MD GYN:   SU: Dr. Brantley Stage OTHER MD:  CHIEF COMPLAINT:  Right-sided breast cancer  CURRENT THERAPY: receiving adjuvant chemotherapy  BREAST CANCER HISTORY: From the original consult note:  "Brandy Jackson is a 63 y.o. female. Patient palpated a right breast mass. She had a mammogram performed and she was noted to have a mass in the upper outer quadrant measuring 2.1 x 2.0 x 1.7 cm. 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 Tumor was ER positive PR positive HER-2/neu positive (triple positive) with a proliferation marker Ki-67 at 90%. She had MRI of the breasts performed which showed a solitary mass in the upper outer quadrant measuring 2.5 cm. Her case was discussed at the multidisciplinary breast conference."  Her subsequent treatment is as detailed below.   INTERVAL HISTORY:  Tine returns today accompanied by her husband for follow up of her breast cancer. Today is day 8, cycle 6 of 6 planned cycles of carboplatin and docetaxel, given with neulasta on day 2 for granulocyte support. She also gets trastuzumab weekly while on this regimen. She is tolerating treatments well with a few complaints. She takes [place] daily for loose stools. The swelling to her bilateral ankles has resolved. Ocassionally her toes go numb, but this resolves with rest and massage.   REVIEW OF SYSTEMS:  A detailed review of systems is otherwise noncontributory, except where noted above.   PAST MEDICAL HISTORY: Past Medical History  Diagnosis Date  . Diabetes mellitus without complication   . Arthritis   . Hot flashes   . Cancer   . Breast cancer   . Wears glasses   . Wears partial dentures     bottom    PAST SURGICAL HISTORY: Past Surgical History  Procedure Laterality Date  . Bladder repair w/ cesarean section    . Colonoscopy    . Portacath placement N/A 04/11/2014   Procedure: INSERTION PORT-A-CATH;  Surgeon: Joyice Faster. Cornett, MD;  Location: [place];  Service: General;  Laterality: N/A;  . Re-excision of breast lumpectomy Right 06/06/2014    Procedure: RE-EXCISION OF BREAST LUMPECTOMY;  Surgeon: Marcello Moores A. Cornett, MD;  Location: [place];  Service: General;  Laterality: Right;    FAMILY HISTORY History reviewed. No pertinent family history.  GYNECOLOGIC HISTORY: menarche at age 84, G36 P3, went through menopause at age 82.  No h/o of HRT, no h/o abnormal pap smears, or sexually transmitted infections.  Does have frequent vaginal candida infections due to diabetes that she follows with her gynecologist for.    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 Her husband [name] is retired from [employer]. He is a cancer survivor himself. The patient has 3 sons, one of whom is studying to be a pediatrician. All her children are married. She has 6 grandchildren.    ADVANCED DIRECTIVES: not in place.    HEALTH MAINTENANCE: History  Substance Use Topics  . Smoking status: Former Smoker -- 1.00 packs/day    Quit date: 03/08/1986  . Smokeless tobacco: Never Used  . Alcohol Use: No     Colonoscopy: Bone Density Scan:  Pap Smear:   Vitamin D Level:   Lipid Panel:    Allergies  Allergen Reactions  . Sulfa Antibiotics Rash    Current Outpatient Prescriptions  Medication Sig Dispense  Refill  . cholestyramine (QUESTRAN) 4 G packet Take 1 packet (4 g total) by mouth 2 (two) times daily.  60 each  12  . dexamethasone (DECADRON) 4 MG tablet Take 2 tablets (8 mg total) by mouth 2 (two) times daily. Take the day before chemo, then the day after chemo x 3 days.  30 tablet  2  . lidocaine-prilocaine (EMLA) cream Apply topically as needed.  30 g  1  . Loperamide HCl (IMODIUM PO) Take 1 tablet by mouth.      . metFORMIN (GLUCOPHAGE) 500 MG tablet Take 2  tablets (1,000 mg total) by mouth 2 (two) times daily with a meal.  120 tablet  11  . ondansetron (ZOFRAN) 8 MG tablet Take 1 tablet (8 mg total) by mouth 2 (two) times daily. Take starting the day after chemo for three days, then take twice a day PRN.  20 tablet  1   No current facility-administered medications for this visit.   Facility-Administered Medications Ordered in Other Visits  Medication Dose Route Frequency Provider Last Rate Last Dose  . sodium chloride 0.9 % injection 10 mL  10 mL Intracatheter PRN Rulon Eisenmenger, MD      . sodium chloride 0.9 % injection 10 mL  10 mL Intracatheter PRN Chauncey Cruel, MD   10 mL at 09/22/14 1612    OBJECTIVE: Middle-aged [place] American woman who appears stated age 67 Vitals:   09/22/14 1439  BP: 131/88  Pulse: 85  Temp: 98.3 F (36.8 C)  Resp: 18     Body mass index is 32.79 kg/(m^2).      Sclerae unicteric, pupils equal and reactive Oropharynx clear and moist-- no thrush No cervical or supraclavicular adenopathy Lungs no rales or rhonchi Heart regular rate and rhythm Abd soft, nontender, positive bowel sounds MSK no focal spinal tenderness, no upper extremity lymphedema Neuro: nonfocal, well oriented, appropriate affect Breasts: deferred  ECOG FS:1  LAB RESULTS:  CMP     Component Value Date/Time   NA 142 09/22/2014 1406   NA 142 04/07/2014 1320   K 3.7 09/22/2014 1406   K 4.2 04/07/2014 1320   CL 103 04/07/2014 1320   CO2 23 09/22/2014 1406   CO2 25 04/07/2014 1320   GLUCOSE 165* 09/22/2014 1406   GLUCOSE 126* 04/07/2014 1320   BUN 15.1 09/22/2014 1406   BUN 16 04/07/2014 1320   CREATININE 0.7 09/22/2014 1406   CREATININE 0.56 04/07/2014 1320   CALCIUM 9.3 09/22/2014 1406   CALCIUM 9.4 04/07/2014 1320   PROT 6.5 09/22/2014 1406   PROT 7.8 04/07/2014 1320   ALBUMIN 3.6 09/22/2014 1406   ALBUMIN 3.9 04/07/2014 1320   AST 14 09/22/2014 1406   AST 20 04/07/2014 1320   ALT 19 09/22/2014 1406   ALT 19 04/07/2014 1320   ALKPHOS  85 09/22/2014 1406   ALKPHOS 68 04/07/2014 1320   BILITOT 0.21 09/22/2014 1406   BILITOT 0.2* 04/07/2014 1320   GFRNONAA >90 04/07/2014 1320   GFRAA >90 04/07/2014 1320    I No results found for this basename: SPEP,  UPEP,   kappa and lambda light chains    Lab Results  Component Value Date   WBC 9.9 09/22/2014   NEUTROABS 7.0* 09/22/2014   HGB 10.8* 09/22/2014   HCT 34.3* 09/22/2014   MCV 86.8 09/22/2014   PLT 212 09/22/2014      Chemistry      Component Value Date/Time   NA 142 09/22/2014 1406  NA 142 04/07/2014 1320   K 3.7 09/22/2014 1406   K 4.2 04/07/2014 1320   CL 103 04/07/2014 1320   CO2 23 09/22/2014 1406   CO2 25 04/07/2014 1320   BUN 15.1 09/22/2014 1406   BUN 16 04/07/2014 1320   CREATININE 0.7 09/22/2014 1406   CREATININE 0.56 04/07/2014 1320      Component Value Date/Time   CALCIUM 9.3 09/22/2014 1406   CALCIUM 9.4 04/07/2014 1320   ALKPHOS 85 09/22/2014 1406   ALKPHOS 68 04/07/2014 1320   AST 14 09/22/2014 1406   AST 20 04/07/2014 1320   ALT 19 09/22/2014 1406   ALT 19 04/07/2014 1320   BILITOT 0.21 09/22/2014 1406   BILITOT 0.2* 04/07/2014 1320       No results found for this basename: LABCA2    No components found with this basename: LABCA125    No results found for this basename: INR,  in the last 168 hours  Urinalysis No results found for this basename: colorurine,  appearanceur,  labspec,  phurine,  glucoseu,  hgbur,  bilirubinur,  ketonesur,  proteinur,  urobilinogen,  nitrite,  leukocytesur    STUDIES: Most recent echocardiogram on 07/03/14  showed an ejection fraction of 60-65%  ASSESSMENT: 63 y.o. [place],woman status post right lumpectomy and sentinel lymph node sampling 04/11/2014 for a pT2 pN1, stage IIB invasive ductal carcinoma, grade 3, estrogen receptor strongly positive, progesterone receptor negative, with an MIB-1 of 94% and HER-2 amplification  (1) additional surgery in 06/06/2014 for margin clearance showed no evidence of residual  malignancy  (2) received the first of six planned cycles of adjuvant chemotherapy with carboplatin docetaxel and trastuzumab 05/02/2014, with chemotherapy interrupted because of her intervening surgery; chemo to be resumed 06/26/2014  (3) continuing trastuzumab through May 2016 to complete 1 year  (4) the patient will need adjuvant radiation at the completion of chemotherapy  (5) she will start antiestrogen therapy at the completion of radiation  PLAN:  [name] continues to perform well with treatment. The labs were reviewed in detail, she has some treatment related anemia, but she is asymptomatic.   We will proceed cycle 6 day 8 of carboplatin and docetaxel along with trastuzumab. She is due for trastuzumab only today. HSe has one more remaining dose of weekly trastuzumab next week and then will continue trastuzuamb every 3 weeks to complete a year of anti-HER2 therapy. Her next echocardiogram is planned for 10/09/14. She will be seen by Radiation Oncology next week. She will start antiestrogen therapy after the completion of radiation.   Roiza's next visit in this office will be next Friday. She understands and agrees with this plan. She knows the goal of treatment in her case is cure. She has been encouraged to call with any issues that might arise before her next visit here.   Mikey Bussing, [place] [phone] 09/22/2014 4:22 PM

## 2014-09-26 ENCOUNTER — Telehealth: Payer: Self-pay | Admitting: *Deleted

## 2014-09-26 ENCOUNTER — Other Ambulatory Visit: Payer: Self-pay | Admitting: Oncology

## 2014-09-26 NOTE — Telephone Encounter (Signed)
Received call from [facility] with dental practice of Dr. Lorne Skeens. Patient has an abscessed tooth and will need antibiotic. Spoke with Dr. Jana Hakim and ok to proceed.

## 2014-09-27 ENCOUNTER — Ambulatory Visit
Admission: RE | Admit: 2014-09-27 | Discharge: 2014-09-27 | Disposition: A | Discharge status: Home / Self Care | Payer: BC Managed Care – PPO | Source: Ambulatory Visit | Attending: Radiation Oncology | Admitting: Radiation Oncology

## 2014-09-27 ENCOUNTER — Encounter: Payer: Self-pay | Admitting: Radiation Oncology

## 2014-09-27 VITALS — BP 122/56 | HR 92 | Temp 98.3°F | Wt 212.6 lb

## 2014-09-27 DIAGNOSIS — C50411 Malignant neoplasm of upper-outer quadrant of right female breast: Secondary | ICD-10-CM

## 2014-09-27 DIAGNOSIS — Z9221 Personal history of antineoplastic chemotherapy: Secondary | ICD-10-CM | POA: Diagnosis not present

## 2014-09-27 DIAGNOSIS — Z79899 Other long term (current) drug therapy: Secondary | ICD-10-CM | POA: Diagnosis not present

## 2014-09-27 DIAGNOSIS — L599 Disorder of the skin and subcutaneous tissue related to radiation, unspecified: Secondary | ICD-10-CM | POA: Diagnosis not present

## 2014-09-27 DIAGNOSIS — Z510 Encounter for antineoplastic radiation therapy: Secondary | ICD-10-CM | POA: Diagnosis not present

## 2014-09-27 NOTE — Progress Notes (Signed)
   Department of Radiation Oncology  Phone:  [phone] Fax:        [phone]   Name: Brandy Jackson MRN: [id number]  DOB: 1951/05/26  Date: 09/27/2014  Follow Up Visit Note  Diagnosis: Breast cancer of upper-outer quadrant of right female breast   Primary site: Breast (Right)   Staging method: AJCC 7th Edition   Clinical: Stage IIA (T2, N0, cM0)   Summary: Stage IIA (T2, N0, cM0)   Clinical comments: Staged at breast conference 03/08/14.  Interval History: Brandy Jackson presents today for routine followup.  She is feeling well and doing well. She is accompanied by her husband who is a three time cancer survivor. She has completed chemotherapy and will be getting Herceptin alone. She had 1 sentinel node positive at the time of her surgery. Re-excision showed negative margins.   Physical Exam:  Filed Vitals:   09/27/14 1538  BP: 122/56  Pulse: 92  Temp: 98.3 F (36.8 C)  Weight: 212 lb 9.6 oz (96.435 kg)   Pleasant female. No distress. Alert and oriented.   IMPRESSION: Brandy Jackson is a 63 y.o. female s/p lumpectomy, SLNBx and adjuvant chemotherapy with Stage T2N1 breast cancer (1/3 +SLN)  PLAN:  I spoke to the patient today regarding her diagnosis and options for treatment. We discussed the equivalence in terms of survival and local failure between mastectomy and breast conservation. We discussed the role of radiation in decreasing local failures in patients who undergo lumpectomy. According to the MD [facility] SLN nomogram, she has a risk of 33% of additional positive lymph nodes.  For that reason, I will treat her with "high" tangents to cover the lower axilla.   We discussed the process of simulation and the placement tattoos. We discussed 6 weeks of treatment as an outpatient. We discussed the possibility of asymptomatic lung damage. We discussed the low likelihood of secondary malignancies. We discussed the possible side effects including but not limited to skin redness, fatigue,  permanent skin darkening, and breast swelling.   She signed informed consent.  She still is only about 10 days from her last chemotherapy so we will give her another week or so before scheduling simulation which I did for her today. I also gave her information about obtaining a wig at our gift shop.     Thea Silversmith, MD

## 2014-09-27 NOTE — Progress Notes (Signed)
Location of Breast Cancer:Right Breast upper-outer quadrant  Histology per Pathology Report:  06/06/14:Diagnosis 1. Breast, excision, right posterior margin - BENIGN BREAST PARENCHYMA. - THERE IS NO EVIDENCE OF MALIGNANCY. - SEE COMMENT. 2. Breast, excision, right superior margin - BENIGN BREAST PARENCHYMA. - THERE IS NO EVIDENCE OF MALIGNANCY. - SEE COMMENT. 3. Breast, excision, right lateral margin - BENIGN BREAST PARENCHYMA. - THERE IS NO EVIDENCE OF MALIGNANCY. - SEE COMMENT. 4. Breast, excision, right medial margin - BENIGN BREAST PARENCHYMA. - THERE IS NO EVIDENCE OF MALIGNANCY. - SEE COMMENT. 5. Breast, excision, right inferior margin - BENIGN BREAST PARENCHYMA. - THERE IS NO EVIDENCE OF MALIGNANCY. 6. Breast, excision, right anterior margin - BENIGN BREAST PARENCHYMA. - THERE IS NO EVIDENCE OF MALIGNANCY.  Receptor Status: ER(+), PR (+), Her2-neu (+)  Did patient present with symptoms (if so, please note symptoms) or was this found on screening mammography?:Mass palpated by patient.Diagnosis confirmed by biopsy.  Past/Anticipated interventions by surgeon, if any: 06/06/14:     RE-EXCISION OF BREAST LUMPECTOMY  05/06/14 RADIOACTIVE SEED GUIDED PARTIAL MASTECTOMY WITH AXILLARY SENTINEL LYMPH NODE BIOPSY AND AXILLARY LYMPH NODE DISSECTION   Past/Anticipated interventions by medical oncology, if any: Chemotherapy:carboplatin and docetaxel x 6 cycles  with neulasta. Completed chemo week of 09/15/14.   Lymphedema issues, if any:No  Pain issues, if any: Toothache.scheduled for surgery tomorrow for root canal.   SAFETY ISSUES:  Prior radiation? No  Pacemaker/ICD? No  Possible current pregnancy?No  Is the patient on methotrexate? No  Current Complaints / other details: GYNECOLOGIC HISTORY: menarche at age 21, G80 P3, went through menopause at age 89. No h/o of HRT, no h/o abnormal pap smears, or sexually transmitted infections. Does have frequent vaginal candida  infections due to diabetes that she follows with her gynecologist. Quit smoking in 1987. Allergies:Sulfa Employed as Pharmacist, hospital at [employer], Kathyrn D[place], [place] 09/27/2014,4:07 PM

## 2014-09-27 NOTE — Progress Notes (Signed)
Please see the Nurse Progress Note in the MD Initial Consult Encounter for this patient. 

## 2014-09-29 ENCOUNTER — Encounter: Payer: Self-pay | Admitting: Oncology

## 2014-09-29 ENCOUNTER — Other Ambulatory Visit (HOSPITAL_BASED_OUTPATIENT_CLINIC_OR_DEPARTMENT_OTHER): Payer: BC Managed Care – PPO

## 2014-09-29 ENCOUNTER — Ambulatory Visit: Payer: BC Managed Care – PPO

## 2014-09-29 ENCOUNTER — Ambulatory Visit (HOSPITAL_BASED_OUTPATIENT_CLINIC_OR_DEPARTMENT_OTHER): Payer: BC Managed Care – PPO | Admitting: Oncology

## 2014-09-29 ENCOUNTER — Ambulatory Visit (HOSPITAL_BASED_OUTPATIENT_CLINIC_OR_DEPARTMENT_OTHER): Payer: BC Managed Care – PPO

## 2014-09-29 VITALS — BP 137/58 | HR 87 | Temp 98.4°F | Resp 18 | Ht 67.0 in | Wt 212.7 lb

## 2014-09-29 DIAGNOSIS — Z95828 Presence of other vascular implants and grafts: Secondary | ICD-10-CM

## 2014-09-29 DIAGNOSIS — Z5112 Encounter for antineoplastic immunotherapy: Secondary | ICD-10-CM

## 2014-09-29 DIAGNOSIS — C50411 Malignant neoplasm of upper-outer quadrant of right female breast: Secondary | ICD-10-CM

## 2014-09-29 DIAGNOSIS — Z170 Estrogen receptor positive status [ER+]: Secondary | ICD-10-CM

## 2014-09-29 DIAGNOSIS — D6481 Anemia due to antineoplastic chemotherapy: Secondary | ICD-10-CM

## 2014-09-29 LAB — CBC WITH DIFFERENTIAL/PLATELET
BASO%: 0.1 % (ref 0.0–2.0)
BASOS ABS: 0 10*3/uL (ref 0.0–0.1)
EOS%: 0.4 % (ref 0.0–7.0)
Eosinophils Absolute: 0 10*3/uL (ref 0.0–0.5)
HCT: 33.7 % — ABNORMAL LOW (ref 34.8–46.6)
HEMOGLOBIN: 11 g/dL — AB (ref 11.6–15.9)
LYMPH%: 20.4 % (ref 14.0–49.7)
MCH: 28.1 pg (ref 25.1–34.0)
MCHC: 32.6 g/dL (ref 31.5–36.0)
MCV: 86.2 fL (ref 79.5–101.0)
MONO#: 0.4 10*3/uL (ref 0.1–0.9)
MONO%: 5.4 % (ref 0.0–14.0)
NEUT#: 6 10*3/uL (ref 1.5–6.5)
NEUT%: 73.7 % (ref 38.4–76.8)
Platelets: 231 10*3/uL (ref 145–400)
RBC: 3.91 10*6/uL (ref 3.70–5.45)
RDW: 17.4 % — AB (ref 11.2–14.5)
WBC: 8.2 10*3/uL (ref 3.9–10.3)
lymph#: 1.7 10*3/uL (ref 0.9–3.3)

## 2014-09-29 LAB — COMPREHENSIVE METABOLIC PANEL (CC13)
ALBUMIN: 3.4 g/dL — AB (ref 3.5–5.0)
ALK PHOS: 85 U/L (ref 40–150)
ALT: 24 U/L (ref 0–55)
AST: 18 U/L (ref 5–34)
Anion Gap: 9 mEq/L (ref 3–11)
BUN: 11.2 mg/dL (ref 7.0–26.0)
CHLORIDE: 109 meq/L (ref 98–109)
CO2: 24 mEq/L (ref 22–29)
Calcium: 9.1 mg/dL (ref 8.4–10.4)
Creatinine: 0.7 mg/dL (ref 0.6–1.1)
Glucose: 151 mg/dl — ABNORMAL HIGH (ref 70–140)
POTASSIUM: 3.7 meq/L (ref 3.5–5.1)
SODIUM: 142 meq/L (ref 136–145)
TOTAL PROTEIN: 6.4 g/dL (ref 6.4–8.3)
Total Bilirubin: 0.25 mg/dL (ref 0.20–1.20)

## 2014-09-29 MED ORDER — HEPARIN SOD (PORK) LOCK FLUSH 100 UNIT/ML IV SOLN
500.0000 [IU] | Freq: Once | INTRAVENOUS | Status: AC | PRN
  Administered 2014-09-29: 500 [IU]
  Filled 2014-09-29: qty 5

## 2014-09-29 MED ORDER — TRASTUZUMAB CHEMO INJECTION 440 MG
2.0000 mg/kg | Freq: Once | INTRAVENOUS | Status: AC
  Administered 2014-09-29: 189 mg via INTRAVENOUS
  Filled 2014-09-29: qty 9

## 2014-09-29 MED ORDER — SODIUM CHLORIDE 0.9 % IJ SOLN
10.0000 mL | INTRAMUSCULAR | Status: DC | PRN
  Administered 2014-09-29: 10 mL
  Filled 2014-09-29: qty 10

## 2014-09-29 MED ORDER — ACETAMINOPHEN 325 MG PO TABS
ORAL_TABLET | ORAL | Status: AC
  Filled 2014-09-29: qty 2

## 2014-09-29 MED ORDER — DIPHENHYDRAMINE HCL 25 MG PO CAPS
ORAL_CAPSULE | ORAL | Status: AC
  Filled 2014-09-29: qty 1

## 2014-09-29 MED ORDER — DIPHENHYDRAMINE HCL 25 MG PO CAPS
25.0000 mg | ORAL_CAPSULE | Freq: Once | ORAL | Status: AC
  Administered 2014-09-29: 25 mg via ORAL

## 2014-09-29 MED ORDER — FLUCONAZOLE 200 MG PO TABS: 200.0000 mg | ORAL_TABLET | Freq: Once | ORAL | Status: DC

## 2014-09-29 MED ORDER — SODIUM CHLORIDE 0.9 % IV SOLN
Freq: Once | INTRAVENOUS | Status: AC
  Administered 2014-09-29: 16:00:00 via INTRAVENOUS

## 2014-09-29 MED ORDER — SODIUM CHLORIDE 0.9 % IJ SOLN
10.0000 mL | INTRAMUSCULAR | Status: DC | PRN
  Administered 2014-09-29: 10 mL via INTRAVENOUS
  Filled 2014-09-29: qty 10

## 2014-09-29 MED ORDER — ACETAMINOPHEN 325 MG PO TABS
650.0000 mg | ORAL_TABLET | Freq: Once | ORAL | Status: AC
  Administered 2014-09-29: 650 mg via ORAL

## 2014-09-29 MED ORDER — LIDOCAINE-PRILOCAINE 2.5-2.5 % EX CREA: TOPICAL_CREAM | CUTANEOUS | Status: DC | PRN

## 2014-09-29 NOTE — Patient Instructions (Signed)
[facility] Discharge Instructions for Patients Receiving Chemotherapy  Today you received the following chemotherapy agents herceptin  To help prevent nausea and vomiting after your treatment, we encourage you to take your nausea medication as directed if needed.   If you develop nausea and vomiting that is not controlled by your nausea medication, call the clinic.   BELOW ARE SYMPTOMS THAT SHOULD BE REPORTED IMMEDIATELY:  *FEVER GREATER THAN 100.5 F  *CHILLS WITH OR WITHOUT FEVER  NAUSEA AND VOMITING THAT IS NOT CONTROLLED WITH YOUR NAUSEA MEDICATION  *UNUSUAL SHORTNESS OF BREATH  *UNUSUAL BRUISING OR BLEEDING  TENDERNESS IN MOUTH AND THROAT WITH OR WITHOUT PRESENCE OF ULCERS  *URINARY PROBLEMS  *BOWEL PROBLEMS  UNUSUAL RASH Items with * indicate a potential emergency and should be followed up as soon as possible.  Feel free to call the clinic you have any questions or concerns. The clinic phone number is [phone].    Trastuzumab injection for infusion What is this medicine? TRASTUZUMAB (tras TOO zoo mab) is a monoclonal antibody. It targets a protein called HER2. This protein is found in some stomach and breast cancers. This medicine can stop cancer cell growth. This medicine may be used with other cancer treatments. This medicine may be used for other purposes; ask your health care provider or pharmacist if you have questions. COMMON BRAND NAME(S): Herceptin What should I tell my health care provider before I take this medicine? They need to know if you have any of these conditions: -heart disease -heart failure -infection (especially a virus infection such as chickenpox, cold sores, or herpes) -lung or breathing disease, like asthma -recent or ongoing radiation therapy -an unusual or allergic reaction to trastuzumab, benzyl alcohol, or other medications, foods, dyes, or preservatives -pregnant or trying to get pregnant -breast-feeding How  should I use this medicine? This drug is given as an infusion into a vein. It is administered in a hospital or clinic by a specially trained health care professional. Talk to your pediatrician regarding the use of this medicine in children. This medicine is not approved for use in children. Overdosage: If you think you have taken too much of this medicine contact a poison control center or emergency room at once. NOTE: This medicine is only for you. Do not share this medicine with others. What if I miss a dose? It is important not to miss a dose. Call your doctor or health care professional if you are unable to keep an appointment. What may interact with this medicine? -cyclophosphamide -doxorubicin -warfarin This list may not describe all possible interactions. Give your health care provider a list of all the medicines, herbs, non-prescription drugs, or dietary supplements you use. Also tell them if you smoke, drink alcohol, or use illegal drugs. Some items may interact with your medicine. What should I watch for while using this medicine? Visit your doctor for checks on your progress. Report any side effects. Continue your course of treatment even though you feel ill unless your doctor tells you to stop. Call your doctor or health care professional for advice if you get a fever, chills or sore throat, or other symptoms of a cold or flu. Do not treat yourself. Try to avoid being around people who are sick. You may experience fever, chills and shaking during your first infusion. These effects are usually mild and can be treated with other medicines. Report any side effects during the infusion to your health care professional. Fever and chills usually do  not happen with later infusions. What side effects may I notice from receiving this medicine? Side effects that you should report to your doctor or other health care professional as soon as possible: -breathing difficulties -chest pain or  palpitations -cough -dizziness or fainting -fever or chills, sore throat -skin rash, itching or hives -swelling of the legs or ankles -unusually weak or tired Side effects that usually do not require medical attention (report to your doctor or other health care professional if they continue or are bothersome): -loss of appetite -headache -muscle aches -nausea This list may not describe all possible side effects. Call your doctor for medical advice about side effects. You may report side effects to FDA at 1-800-FDA-1088. Where should I keep my medicine? This drug is given in a hospital or clinic and will not be stored at home. NOTE: This sheet is a summary. It may not cover all possible information. If you have questions about this medicine, talk to your doctor, pharmacist, or health care provider.  2015, Elsevier/Gold Standard. (2009-09-28 13:43:15)

## 2014-09-29 NOTE — Progress Notes (Signed)
ID: [name] OB: 12-16-1950  MR#: [id number]  OMB#:[id number]  PCP: Ricke Hey, MD GYN:   SU: Dr. Brantley Stage OTHER MD:  CHIEF COMPLAINT:  Right-sided breast cancer  CURRENT THERAPY: receiving adjuvant chemotherapy  BREAST CANCER HISTORY: From the original consult note:  "Brandy Jackson is a 62 y.o. female. Patient palpated a right breast mass. She had a mammogram performed and she was noted to have a mass in the upper outer quadrant measuring 2.1 x 2.0 x 1.7 cm. 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 Tumor was ER positive PR positive HER-2/neu positive (triple positive) with a proliferation marker Ki-67 at 90%. She had MRI of the breasts performed which showed a solitary mass in the upper outer quadrant measuring 2.5 cm. Her case was discussed at the multidisciplinary breast conference."  Her subsequent treatment is as detailed below.   INTERVAL HISTORY:  Brandy Jackson returns today accompanied by her husband for follow up of her breast cancer. Today is day 15, cycle 6 of 6 planned cycles of carboplatin and docetaxel, given with neulasta on day 2 for granulocyte support. She also gets trastuzumab weekly while on this regimen. She is tolerating treatments well with a few complaints. She takes [place] daily for loose stools. The swelling to her bilateral ankles has resolved. Ocassionally her toes go numb, but this resolves with rest and massage. She had to have urgent dental surgery yesterday. She is currently on amoxicillin prophylactically. Reports that her tooth feels much better. She feels as though she is developing a yeast infection due to the amoxicillin and has requested a prescription for fluconazole.  REVIEW OF SYSTEMS:  A detailed review of systems is otherwise noncontributory, except where noted above.   PAST MEDICAL HISTORY: Past Medical History  Diagnosis Date  . Diabetes mellitus without complication   . Arthritis   . Hot flashes    . Cancer   . Breast cancer   . Wears glasses   . Wears partial dentures     bottom    PAST SURGICAL HISTORY: Past Surgical History  Procedure Laterality Date  . Bladder repair w/ cesarean section    . Colonoscopy    . Portacath placement N/A 04/11/2014    Procedure: INSERTION PORT-A-CATH;  Surgeon: Joyice Faster. Cornett, MD;  Location: [facility];  Service: General;  Laterality: N/A;  . Re-excision of breast lumpectomy Right 06/06/2014    Procedure: RE-EXCISION OF BREAST LUMPECTOMY;  Surgeon: Marcello Moores A. Cornett, MD;  Location: [facility];  Service: General;  Laterality: Right;    FAMILY HISTORY History reviewed. No pertinent family history.  GYNECOLOGIC HISTORY: menarche at age 25, G32 P3, went through menopause at age 89.  No h/o of HRT, no h/o abnormal pap smears, or sexually transmitted infections.  Does have frequent vaginal candida infections due to diabetes that she follows with her gynecologist for.    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 Her husband [name] is retired from [employer]. He is a cancer survivor himself. The patient has 3 sons, one of whom is studying to be a pediatrician. All her children are married. She has 6 grandchildren.    ADVANCED DIRECTIVES: not in place.    HEALTH MAINTENANCE: History  Substance Use Topics  . Smoking status: Former Smoker -- 1.00 packs/day    Quit date: 03/08/1986  . Smokeless tobacco: Never Used  . Alcohol Use:  No     Colonoscopy: Bone Density Scan:  Pap Smear:   Vitamin D Level:   Lipid Panel:    Allergies  Allergen Reactions  . Sulfa Antibiotics Rash    Current Outpatient Prescriptions  Medication Sig Dispense Refill  . amoxicillin (AMOXIL) 500 MG capsule Take 500 mg by mouth 3 (three) times daily.      . cholestyramine (QUESTRAN) 4 G packet Take 1 packet (4 g total) by mouth 2 (two) times daily.  60 each  12   . dexamethasone (DECADRON) 4 MG tablet Take 2 tablets (8 mg total) by mouth 2 (two) times daily. Take the day before chemo, then the day after chemo x 3 days.  30 tablet  2  . lidocaine-prilocaine (EMLA) cream Apply topically as needed.  30 g  1  . Loperamide HCl (IMODIUM PO) Take 1 tablet by mouth.      . metFORMIN (GLUCOPHAGE) 500 MG tablet Take 500 mg by mouth 2 (two) times daily with a meal.      . ondansetron (ZOFRAN) 8 MG tablet Take 1 tablet (8 mg total) by mouth 2 (two) times daily. Take starting the day after chemo for three days, then take twice a day PRN.  20 tablet  1  . fluconazole (DIFLUCAN) 200 MG tablet Take 1 tablet (200 mg total) by mouth once.  1 tablet  0   No current facility-administered medications for this visit.   Facility-Administered Medications Ordered in Other Visits  Medication Dose Route Frequency Provider Last Rate Last Dose  . heparin lock flush 100 unit/mL  500 Units Intracatheter Once PRN Maryanna Shape, NP      . sodium chloride 0.9 % injection 10 mL  10 mL Intracatheter PRN Rulon Eisenmenger, MD      . sodium chloride 0.9 % injection 10 mL  10 mL Intracatheter PRN Maryanna Shape, NP      . trastuzumab (HERCEPTIN) 189 mg in sodium chloride 0.9 % 250 mL chemo infusion  2 mg/kg (Treatment Plan Actual) Intravenous Once Maryanna Shape, NP 518 mL/hr at 09/29/14 1557 189 mg at 09/29/14 1557    OBJECTIVE: Middle-aged [place] American woman who appears stated age 59 Vitals:   09/29/14 1426  BP: 137/58  Pulse: 87  Temp: 98.4 F (36.9 C)  Resp: 18     Body mass index is 33.31 kg/(m^2).      Sclerae unicteric, pupils equal and reactive Oropharynx clear and moist-- no thrush No cervical or supraclavicular adenopathy Lungs no rales or rhonchi Heart regular rate and rhythm Abd soft, nontender, positive bowel sounds MSK no focal spinal tenderness, no upper extremity lymphedema Neuro: nonfocal, well oriented, appropriate affect Breasts: deferred  ECOG  FS:1  LAB RESULTS:  CMP     Component Value Date/Time   NA 142 09/29/2014 1403   NA 142 04/07/2014 1320   K 3.7 09/29/2014 1403   K 4.2 04/07/2014 1320   CL 103 04/07/2014 1320   CO2 24 09/29/2014 1403   CO2 25 04/07/2014 1320   GLUCOSE 151* 09/29/2014 1403   GLUCOSE 126* 04/07/2014 1320   BUN 11.2 09/29/2014 1403   BUN 16 04/07/2014 1320   CREATININE 0.7 09/29/2014 1403   CREATININE 0.56 04/07/2014 1320   CALCIUM 9.1 09/29/2014 1403   CALCIUM 9.4 04/07/2014 1320   PROT 6.4 09/29/2014 1403   PROT 7.8 04/07/2014 1320   ALBUMIN 3.4* 09/29/2014 1403   ALBUMIN 3.9 04/07/2014 1320   AST 18 09/29/2014  1403   AST 20 04/07/2014 1320   ALT 24 09/29/2014 1403   ALT 19 04/07/2014 1320   ALKPHOS 85 09/29/2014 1403   ALKPHOS 68 04/07/2014 1320   BILITOT 0.25 09/29/2014 1403   BILITOT 0.2* 04/07/2014 1320   GFRNONAA >90 04/07/2014 1320   GFRAA >90 04/07/2014 1320    I No results found for this basename: SPEP,  UPEP,   kappa and lambda light chains    Lab Results  Component Value Date   WBC 8.2 09/29/2014   NEUTROABS 6.0 09/29/2014   HGB 11.0* 09/29/2014   HCT 33.7* 09/29/2014   MCV 86.2 09/29/2014   PLT 231 09/29/2014      Chemistry      Component Value Date/Time   NA 142 09/29/2014 1403   NA 142 04/07/2014 1320   K 3.7 09/29/2014 1403   K 4.2 04/07/2014 1320   CL 103 04/07/2014 1320   CO2 24 09/29/2014 1403   CO2 25 04/07/2014 1320   BUN 11.2 09/29/2014 1403   BUN 16 04/07/2014 1320   CREATININE 0.7 09/29/2014 1403   CREATININE 0.56 04/07/2014 1320      Component Value Date/Time   CALCIUM 9.1 09/29/2014 1403   CALCIUM 9.4 04/07/2014 1320   ALKPHOS 85 09/29/2014 1403   ALKPHOS 68 04/07/2014 1320   AST 18 09/29/2014 1403   AST 20 04/07/2014 1320   ALT 24 09/29/2014 1403   ALT 19 04/07/2014 1320   BILITOT 0.25 09/29/2014 1403   BILITOT 0.2* 04/07/2014 1320       No results found for this basename: LABCA2    No components found with this basename: LABCA125    No results found for this basename: INR,   in the last 168 hours  Urinalysis No results found for this basename: colorurine,  appearanceur,  labspec,  phurine,  glucoseu,  hgbur,  bilirubinur,  ketonesur,  proteinur,  urobilinogen,  nitrite,  leukocytesur    STUDIES: Most recent echocardiogram on 07/03/14  showed an ejection fraction of 60-65%  ASSESSMENT: 63 y.o. [place],woman status post right lumpectomy and sentinel lymph node sampling 04/11/2014 for a pT2 pN1, stage IIB invasive ductal carcinoma, grade 3, estrogen receptor strongly positive, progesterone receptor negative, with an MIB-1 of 94% and HER-2 amplification  (1) additional surgery in 06/06/2014 for margin clearance showed no evidence of residual malignancy  (2) received the first of six planned cycles of adjuvant chemotherapy with carboplatin docetaxel and trastuzumab 05/02/2014, with chemotherapy interrupted because of her intervening surgery; chemo to be resumed 06/26/2014  (3) continuing trastuzumab through May 2016 to complete 1 year  (4) the patient will need adjuvant radiation at the completion of chemotherapy  (5) she will start antiestrogen therapy at the completion of radiation  PLAN:  Chakita continues to perform well with treatment. The labs were reviewed in detail, she has some treatment related anemia, but she is asymptomatic.   We will proceed cycle 6 day 15 of carboplatin and docetaxel along with trastuzumab. She is due for trastuzumab only today. She will switch to trastuzuamb every 3 weeks on November 13 to complete a year of anti-HER2 therapy. Her next echocardiogram is planned for 10/09/14. She has already been evaluated by radiation oncology and will be scheduled for simulation soon. She will start antiestrogen therapy after the completion of radiation.   I refilled her EMLA cream today. I have also prescribed fluconazole 200 mg x1 dose for her yeast infection. She will contact us if this is  not effective.  Emalea's next visit will be in 3  weeks for her lab and Herceptin and then she'll be seen by Dr. Jana Hakim on November 19. She understands and agrees with this plan. She knows the goal of treatment in her case is cure. She has been encouraged to call with any issues that might arise before her next visit here.   Mikey Bussing, [place] [phone] 09/29/2014 3:59 PM

## 2014-09-29 NOTE — Patient Instructions (Signed)

## 2014-10-09 ENCOUNTER — Encounter: Payer: Self-pay | Admitting: Oncology

## 2014-10-09 ENCOUNTER — Encounter (HOSPITAL_COMMUNITY): Payer: BC Managed Care – PPO

## 2014-10-09 ENCOUNTER — Ambulatory Visit (HOSPITAL_COMMUNITY): Admission: RE | Admit: 2014-10-09 | Payer: BC Managed Care – PPO | Source: Ambulatory Visit

## 2014-10-10 ENCOUNTER — Ambulatory Visit
Admission: RE | Admit: 2014-10-10 | Discharge: 2014-10-10 | Disposition: A | Discharge status: Home / Self Care | Payer: BC Managed Care – PPO | Source: Ambulatory Visit | Attending: Radiation Oncology | Admitting: Radiation Oncology

## 2014-10-10 DIAGNOSIS — Z510 Encounter for antineoplastic radiation therapy: Secondary | ICD-10-CM | POA: Diagnosis not present

## 2014-10-10 DIAGNOSIS — C50411 Malignant neoplasm of upper-outer quadrant of right female breast: Secondary | ICD-10-CM

## 2014-10-10 NOTE — Progress Notes (Signed)
Radiation Oncology         (336) [phone] ________________________________  Name: Brandy Jackson      MRN: [id number]          Date: 10/10/2014              DOB: 07-06-1951  Optical Surface Tracking Plan:  Since intensity modulated radiotherapy (IMRT) and 3D conformal radiation treatment methods are predicated on accurate and precise positioning for treatment, intrafraction motion monitoring is medically necessary to ensure accurate and safe treatment delivery.  The ability to quantify intrafraction motion without excessive ionizing radiation dose can only be performed with optical surface tracking. Accordingly, surface imaging offers the opportunity to obtain 3D measurements of patient position throughout IMRT and 3D treatments without excessive radiation exposure.  I am ordering optical surface tracking for this patient's upcoming course of radiotherapy. ________________________________ Signature   Reference:   Ursula Alert, J, et al. Surface imaging-based analysis of intrafraction motion for breast radiotherapy patients.Journal of [place], n. 6, nov. 2014. ISSN [id number].   Available at: <http://www.jacmp.org/index.php/jacmp/article/view/4957>.

## 2014-10-10 NOTE — Progress Notes (Signed)
Name: Brandy Jackson   MRN: [id number]  Date:  10/10/2014  DOB: 20-Jun-1951  Status:outpatient    DIAGNOSIS: Breast cancer.  CONSENT VERIFIED: yes   SET UP: Patient is setup supine   IMMOBILIZATION:  The following immobilization was used:Custom Moldable Pillow, breast board.   NARRATIVE: [name] was brought to the [facility].  Identity was confirmed.  All relevant records and images related to the planned course of therapy were reviewed.  Then, the patient was positioned in a stable reproducible clinical set-up for radiation therapy.  Wires were placed to delineate the clinical extent of breast tissue. A wire was placed on the scar as well.  CT images were obtained.  An isocenter was placed. Skin markings were placed.  The CT images were loaded into the planning software where the target and avoidance structures were contoured.  The radiation prescription was entered and confirmed. The patient was discharged in stable condition and tolerated simulation well.    TREATMENT PLANNING NOTE:  Treatment planning then occurred. I have requested : MLC's, isodose plan, basic dose calculation  I personally designed and supervised the construction of 3 medically necessary complex treatment devices for the protection of critical normal structures including the lungs and contralateral breast as well as the immobilization device which is necessary for set up certainty.   3D planning occurred.  I have requested a dose volume histogram of the heart, lungs and lumpectomy cavity.

## 2014-10-13 ENCOUNTER — Encounter: Payer: Self-pay | Admitting: Emergency Medicine

## 2014-10-17 ENCOUNTER — Encounter: Payer: Self-pay | Admitting: *Deleted

## 2014-10-17 ENCOUNTER — Ambulatory Visit: Payer: BC Managed Care – PPO | Admitting: Radiation Oncology

## 2014-10-17 ENCOUNTER — Ambulatory Visit
Admission: RE | Admit: 2014-10-17 | Discharge: 2014-10-17 | Disposition: A | Discharge status: Home / Self Care | Payer: BC Managed Care – PPO | Source: Ambulatory Visit | Attending: Radiation Oncology | Admitting: Radiation Oncology

## 2014-10-17 DIAGNOSIS — Z510 Encounter for antineoplastic radiation therapy: Secondary | ICD-10-CM | POA: Diagnosis not present

## 2014-10-17 DIAGNOSIS — C50411 Malignant neoplasm of upper-outer quadrant of right female breast: Secondary | ICD-10-CM

## 2014-10-17 NOTE — Progress Notes (Addendum)
  Radiation Oncology         (336) [phone] ________________________________  Name: Brandy Jackson MRN: [id number]  Date: 10/17/2014  DOB: 1951/05/17  Simulation Verification Note  Status: outpatient  NARRATIVE: The patient was brought to the treatment unit and placed in the planned treatment position. The clinical setup was verified. Then port films were obtained and uploaded to the radiation oncology medical record software.  The treatment beams were carefully compared against the planned radiation fields. The position location and shape of the radiation fields was reviewed. The targeted volume of tissue appears appropriately covered by the radiation beams. Organs at risk appear to be excluded as planned.  Based on my personal review, I approved the simulation verification. The patient's treatment will proceed as planned.  I verified her set up.  On the tangent fields her arm may not be completely excluded from the field.  We were able to tape her arm somewhat out of the way which was helpful.   ------------------------------------------------  Thea Silversmith, MD

## 2014-10-18 ENCOUNTER — Ambulatory Visit
Admission: RE | Admit: 2014-10-18 | Discharge: 2014-10-18 | Disposition: A | Discharge status: Home / Self Care | Payer: BC Managed Care – PPO | Source: Ambulatory Visit | Attending: Radiation Oncology | Admitting: Radiation Oncology

## 2014-10-18 ENCOUNTER — Ambulatory Visit (HOSPITAL_COMMUNITY)
Admission: RE | Admit: 2014-10-18 | Discharge: 2014-10-18 | Disposition: A | Discharge status: Home / Self Care | Payer: BC Managed Care – PPO | Source: Ambulatory Visit | Attending: Family Medicine | Admitting: Family Medicine

## 2014-10-18 ENCOUNTER — Ambulatory Visit (HOSPITAL_BASED_OUTPATIENT_CLINIC_OR_DEPARTMENT_OTHER)
Admission: RE | Admit: 2014-10-18 | Discharge: 2014-10-18 | Disposition: A | Discharge status: Home / Self Care | Payer: BC Managed Care – PPO | Source: Ambulatory Visit | Attending: Internal Medicine | Admitting: Internal Medicine

## 2014-10-18 ENCOUNTER — Encounter (HOSPITAL_COMMUNITY): Payer: Self-pay

## 2014-10-18 VITALS — BP 127/80 | HR 78 | Resp 18 | Wt 212.0 lb

## 2014-10-18 DIAGNOSIS — I369 Nonrheumatic tricuspid valve disorder, unspecified: Secondary | ICD-10-CM

## 2014-10-18 DIAGNOSIS — C50411 Malignant neoplasm of upper-outer quadrant of right female breast: Secondary | ICD-10-CM | POA: Diagnosis present

## 2014-10-18 DIAGNOSIS — R609 Edema, unspecified: Secondary | ICD-10-CM | POA: Diagnosis not present

## 2014-10-18 DIAGNOSIS — Z79899 Other long term (current) drug therapy: Secondary | ICD-10-CM | POA: Diagnosis not present

## 2014-10-18 DIAGNOSIS — Z170 Estrogen receptor positive status [ER+]: Secondary | ICD-10-CM | POA: Insufficient documentation

## 2014-10-18 DIAGNOSIS — I519 Heart disease, unspecified: Secondary | ICD-10-CM | POA: Insufficient documentation

## 2014-10-18 DIAGNOSIS — Z510 Encounter for antineoplastic radiation therapy: Secondary | ICD-10-CM | POA: Diagnosis not present

## 2014-10-18 NOTE — Patient Instructions (Signed)
Your physician recommends that you schedule a follow-up appointment in: 3 months with echocardiogram  

## 2014-10-18 NOTE — Addendum Note (Signed)
Encounter addended by: Kerry Dory, CMA on: 10/18/2014 10:41 AM<BR>     Documentation filed: Dx Association, Patient Instructions Section, Orders

## 2014-10-18 NOTE — Progress Notes (Signed)
  Echocardiogram 2D Echocardiogram has been performed.  Brandy Jackson 10/18/2014, 9:58 AM

## 2014-10-18 NOTE — Progress Notes (Addendum)
Patient ID: Brandy Jackson, female   DOB: 1951/05/20, 63 y.o.   MRN: [id number]  Cardio-oncology Clinic  HPI:  Brandy Jackson is a 63 y.o. woman with psoriasis. No cardiac history referred by Dr. Chancy Milroy to the cardio-oncology clinic. 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 Tumor was ER positive PR positive HER-2/neu positive (triple positive) with a proliferation marker Ki-67 at 90%. She had MRI of the breasts performed which showed a solitary mass in the upper outer quadrant measuring 2.5 cm.  She has received Taxotere carboplatinum for a total of 6 cycles. She is getting Herceptin every 3 weeks to finish out a total of one year.  Doing fine. Denies CP, SOB. Drinks almost a gallon of water a day. Occasional edema but resolves overnight. BP looks good (was elevated at last visit)  Echo 04/05/14; EF 60-65% Lat s ' 10.2 GLS -17.1% Echo 07/03/14: EF 65-70% Lat s ' 10.8 GLS -18.8% Echo 10/18/14: EF 65-70% Lat s' 11.6 (hard to see) GLS -20.8%   PHYSICAL EXAM: Filed Vitals:   10/18/14 1022  BP: 127/80  Pulse: 78  Resp: 18  Weight: 212 lb (96.163 kg)  SpO2: 99%    General:  Well appearing. No respiratory difficulty HEENT: normal Neck: supple. no JVD. Carotids 2+ bilat; no bruits. No lymphadenopathy or thryomegaly appreciated. Cor: PMI nondisplaced. Regular rate & rhythm. No rubs, gallops or murmurs. Lungs: clear Abdomen: soft, nontender, nondistended. No hepatosplenomegaly. No bruits or masses. Good bowel sounds. Extremities: no cyanosis, clubbing, rash, tr edema. Psoriatic patch on RLE Neuro: alert & oriented x 3, cranial nerves grossly intact. moves all 4 extremities w/o difficulty. Affect pleasant.   ASSESSMENT & PLAN:  1. Breast CA -I reviewed echos personally. EF and Doppler parameters stable. No HF on exam. Continue chemo and Herceptin. F/u with echo every 3 months.  2. Edema -can cut back on fluids a little bit. She does want prn diuretic.    Daniel Bensimhon,MD 10:23 AM

## 2014-10-18 NOTE — Addendum Note (Signed)
Encounter addended by: Jolaine Artist, MD on: 10/18/2014 10:37 AM<BR>     Documentation filed: Notes Section

## 2014-10-19 ENCOUNTER — Ambulatory Visit
Admission: RE | Admit: 2014-10-19 | Discharge: 2014-10-19 | Disposition: A | Discharge status: Home / Self Care | Payer: BC Managed Care – PPO | Source: Ambulatory Visit | Attending: Radiation Oncology | Admitting: Radiation Oncology

## 2014-10-19 DIAGNOSIS — Z510 Encounter for antineoplastic radiation therapy: Secondary | ICD-10-CM | POA: Diagnosis not present

## 2014-10-20 ENCOUNTER — Other Ambulatory Visit: Payer: Self-pay | Admitting: Hematology

## 2014-10-20 ENCOUNTER — Ambulatory Visit
Admission: RE | Admit: 2014-10-20 | Discharge: 2014-10-20 | Disposition: A | Discharge status: Home / Self Care | Payer: BC Managed Care – PPO | Source: Ambulatory Visit | Attending: Radiation Oncology | Admitting: Radiation Oncology

## 2014-10-20 ENCOUNTER — Ambulatory Visit (HOSPITAL_BASED_OUTPATIENT_CLINIC_OR_DEPARTMENT_OTHER): Payer: BC Managed Care – PPO

## 2014-10-20 ENCOUNTER — Ambulatory Visit: Payer: BC Managed Care – PPO

## 2014-10-20 ENCOUNTER — Other Ambulatory Visit (HOSPITAL_BASED_OUTPATIENT_CLINIC_OR_DEPARTMENT_OTHER): Payer: BC Managed Care – PPO

## 2014-10-20 DIAGNOSIS — Z95828 Presence of other vascular implants and grafts: Secondary | ICD-10-CM

## 2014-10-20 DIAGNOSIS — Z5112 Encounter for antineoplastic immunotherapy: Secondary | ICD-10-CM

## 2014-10-20 DIAGNOSIS — Z510 Encounter for antineoplastic radiation therapy: Secondary | ICD-10-CM | POA: Diagnosis not present

## 2014-10-20 DIAGNOSIS — C50411 Malignant neoplasm of upper-outer quadrant of right female breast: Secondary | ICD-10-CM

## 2014-10-20 LAB — CBC WITH DIFFERENTIAL/PLATELET
BASO%: 0.6 % (ref 0.0–2.0)
Basophils Absolute: 0 10*3/uL (ref 0.0–0.1)
EOS%: 0.6 % (ref 0.0–7.0)
Eosinophils Absolute: 0 10*3/uL (ref 0.0–0.5)
HEMATOCRIT: 34.1 % — AB (ref 34.8–46.6)
HEMOGLOBIN: 10.9 g/dL — AB (ref 11.6–15.9)
LYMPH#: 1.6 10*3/uL (ref 0.9–3.3)
LYMPH%: 21.9 % (ref 14.0–49.7)
MCH: 27.7 pg (ref 25.1–34.0)
MCHC: 31.8 g/dL (ref 31.5–36.0)
MCV: 87.2 fL (ref 79.5–101.0)
MONO#: 0.5 10*3/uL (ref 0.1–0.9)
MONO%: 7.2 % (ref 0.0–14.0)
NEUT#: 5 10*3/uL (ref 1.5–6.5)
NEUT%: 69.7 % (ref 38.4–76.8)
Platelets: 304 10*3/uL (ref 145–400)
RBC: 3.91 10*6/uL (ref 3.70–5.45)
RDW: 16.6 % — ABNORMAL HIGH (ref 11.2–14.5)
WBC: 7.1 10*3/uL (ref 3.9–10.3)

## 2014-10-20 LAB — COMPREHENSIVE METABOLIC PANEL (CC13)
ALT: 14 U/L (ref 0–55)
ANION GAP: 10 meq/L (ref 3–11)
AST: 20 U/L (ref 5–34)
Albumin: 3.8 g/dL (ref 3.5–5.0)
Alkaline Phosphatase: 61 U/L (ref 40–150)
BUN: 16.4 mg/dL (ref 7.0–26.0)
CALCIUM: 9.5 mg/dL (ref 8.4–10.4)
CHLORIDE: 109 meq/L (ref 98–109)
CO2: 23 meq/L (ref 22–29)
CREATININE: 0.8 mg/dL (ref 0.6–1.1)
Glucose: 117 mg/dl (ref 70–140)
Potassium: 3.6 mEq/L (ref 3.5–5.1)
SODIUM: 142 meq/L (ref 136–145)
TOTAL PROTEIN: 6.7 g/dL (ref 6.4–8.3)
Total Bilirubin: 0.29 mg/dL (ref 0.20–1.20)

## 2014-10-20 MED ORDER — ACETAMINOPHEN 325 MG PO TABS
ORAL_TABLET | ORAL | Status: AC
Start: 2014-10-20 — End: 2014-10-20
  Filled 2014-10-20: qty 2

## 2014-10-20 MED ORDER — HEPARIN SOD (PORK) LOCK FLUSH 100 UNIT/ML IV SOLN
500.0000 [IU] | Freq: Once | INTRAVENOUS | Status: AC | PRN
  Administered 2014-10-20: 500 [IU]
  Filled 2014-10-20: qty 5

## 2014-10-20 MED ORDER — TRASTUZUMAB CHEMO INJECTION 440 MG
6.0000 mg/kg | Freq: Once | INTRAVENOUS | Status: AC
  Administered 2014-10-20: 567 mg via INTRAVENOUS
  Filled 2014-10-20: qty 27

## 2014-10-20 MED ORDER — SODIUM CHLORIDE 0.9 % IJ SOLN
10.0000 mL | INTRAMUSCULAR | Status: DC | PRN
  Administered 2014-10-20: 10 mL via INTRAVENOUS
  Filled 2014-10-20: qty 10

## 2014-10-20 MED ORDER — DIPHENHYDRAMINE HCL 25 MG PO CAPS: 50.0000 mg | ORAL_CAPSULE | Freq: Once | ORAL | Status: DC

## 2014-10-20 MED ORDER — ACETAMINOPHEN 325 MG PO TABS
650.0000 mg | ORAL_TABLET | Freq: Once | ORAL | Status: AC
  Administered 2014-10-20: 650 mg via ORAL

## 2014-10-20 MED ORDER — SODIUM CHLORIDE 0.9 % IJ SOLN
10.0000 mL | INTRAMUSCULAR | Status: DC | PRN
  Administered 2014-10-20: 10 mL
  Filled 2014-10-20: qty 10

## 2014-10-20 NOTE — Patient Instructions (Signed)

## 2014-10-20 NOTE — Patient Instructions (Signed)
[facility] Discharge Instructions for Patients Receiving Chemotherapy  Today you received the following chemotherapy agents Herceptin.  To help prevent nausea and vomiting after your treatment, we encourage you to take your nausea medication as prescribed.   If you develop nausea and vomiting that is not controlled by your nausea medication, call the clinic.   BELOW ARE SYMPTOMS THAT SHOULD BE REPORTED IMMEDIATELY:  *FEVER GREATER THAN 100.5 F  *CHILLS WITH OR WITHOUT FEVER  NAUSEA AND VOMITING THAT IS NOT CONTROLLED WITH YOUR NAUSEA MEDICATION  *UNUSUAL SHORTNESS OF BREATH  *UNUSUAL BRUISING OR BLEEDING  TENDERNESS IN MOUTH AND THROAT WITH OR WITHOUT PRESENCE OF ULCERS  *URINARY PROBLEMS  *BOWEL PROBLEMS  UNUSUAL RASH Items with * indicate a potential emergency and should be followed up as soon as possible.  Feel free to call the clinic you have any questions or concerns. The clinic phone number is [phone].    

## 2014-10-20 NOTE — Progress Notes (Signed)
Pt in for port access for lab draw, pt accessed with no problems or complaints. Blood return is noted but only 2-3 cc of blood was obtained for waste. Pt is okay with getting lab drawn peripherally. Called and informed infusion, pt is being sent back to phlebotomist for lab draw.

## 2014-10-23 ENCOUNTER — Ambulatory Visit
Admission: RE | Admit: 2014-10-23 | Discharge: 2014-10-23 | Disposition: A | Discharge status: Home / Self Care | Payer: BC Managed Care – PPO | Source: Ambulatory Visit | Attending: Radiation Oncology | Admitting: Radiation Oncology

## 2014-10-23 DIAGNOSIS — Z510 Encounter for antineoplastic radiation therapy: Secondary | ICD-10-CM | POA: Diagnosis not present

## 2014-10-23 DIAGNOSIS — C50411 Malignant neoplasm of upper-outer quadrant of right female breast: Secondary | ICD-10-CM

## 2014-10-23 MED ORDER — ALRA NON-METALLIC DEODORANT (RAD-ONC)
1.0000 "application " | Freq: Once | TOPICAL | Status: AC
  Administered 2014-10-23: 1 via TOPICAL

## 2014-10-23 MED ORDER — RADIAPLEXRX EX GEL
Freq: Once | CUTANEOUS | Status: AC
  Administered 2014-10-23: 17:00:00 via TOPICAL

## 2014-10-23 NOTE — Progress Notes (Signed)
Routine of clinic reviewed.Given Radiation therapy and You Booklet, skin care sheet, alra deodorant and radiaplex.Reviewed possible side effects of treatment to include skin changes, fatigue, tenderness and swelling.Knows to apply gel twice daily in morning and at bedtime.See doctor every Tuesday after treatment and as needed.Drink fluids and maintain activity with normal limits.

## 2014-10-24 ENCOUNTER — Encounter: Payer: Self-pay | Admitting: Radiation Oncology

## 2014-10-24 ENCOUNTER — Ambulatory Visit
Admission: RE | Admit: 2014-10-24 | Discharge: 2014-10-24 | Disposition: A | Discharge status: Home / Self Care | Payer: BC Managed Care – PPO | Source: Ambulatory Visit | Attending: Radiation Oncology | Admitting: Radiation Oncology

## 2014-10-24 VITALS — BP 142/71 | HR 86 | Temp 98.5°F | Resp 20 | Wt 213.9 lb

## 2014-10-24 DIAGNOSIS — Z510 Encounter for antineoplastic radiation therapy: Secondary | ICD-10-CM | POA: Diagnosis not present

## 2014-10-24 DIAGNOSIS — C50411 Malignant neoplasm of upper-outer quadrant of right female breast: Secondary | ICD-10-CM

## 2014-10-24 NOTE — Progress Notes (Signed)
Weekly Management Note Current Dose: 9  Gy  Projected Dose:50.4  Gy   Narrative:  The patient presents for routine under treatment assessment.  CBCT/MVCT images/Port film x-rays were reviewed.  The chart was checked. Doing well. No complaints. Using radiaplex.  Physical Findings: Weight: 213 lb 14.4 oz (97.024 kg). No skin changes.  Impression:  The patient is tolerating radiation.  Plan:  Continue treatment as planned. Continue radiaplex.

## 2014-10-25 ENCOUNTER — Ambulatory Visit
Admission: RE | Admit: 2014-10-25 | Discharge: 2014-10-25 | Disposition: A | Discharge status: Home / Self Care | Payer: BC Managed Care – PPO | Source: Ambulatory Visit | Attending: Radiation Oncology | Admitting: Radiation Oncology

## 2014-10-25 DIAGNOSIS — Z510 Encounter for antineoplastic radiation therapy: Secondary | ICD-10-CM | POA: Diagnosis not present

## 2014-10-26 ENCOUNTER — Other Ambulatory Visit (HOSPITAL_BASED_OUTPATIENT_CLINIC_OR_DEPARTMENT_OTHER): Payer: BC Managed Care – PPO

## 2014-10-26 ENCOUNTER — Ambulatory Visit
Admission: RE | Admit: 2014-10-26 | Discharge: 2014-10-26 | Disposition: A | Discharge status: Home / Self Care | Payer: BC Managed Care – PPO | Source: Ambulatory Visit | Attending: Radiation Oncology | Admitting: Radiation Oncology

## 2014-10-26 ENCOUNTER — Ambulatory Visit (HOSPITAL_BASED_OUTPATIENT_CLINIC_OR_DEPARTMENT_OTHER): Payer: BC Managed Care – PPO | Admitting: Oncology

## 2014-10-26 VITALS — BP 146/57 | HR 90 | Temp 98.5°F | Resp 18 | Ht 67.0 in | Wt 209.5 lb

## 2014-10-26 DIAGNOSIS — D630 Anemia in neoplastic disease: Secondary | ICD-10-CM

## 2014-10-26 DIAGNOSIS — C50411 Malignant neoplasm of upper-outer quadrant of right female breast: Secondary | ICD-10-CM

## 2014-10-26 DIAGNOSIS — Z170 Estrogen receptor positive status [ER+]: Secondary | ICD-10-CM

## 2014-10-26 DIAGNOSIS — R600 Localized edema: Secondary | ICD-10-CM

## 2014-10-26 DIAGNOSIS — Z510 Encounter for antineoplastic radiation therapy: Secondary | ICD-10-CM | POA: Diagnosis not present

## 2014-10-26 LAB — COMPREHENSIVE METABOLIC PANEL (CC13)
ALK PHOS: 66 U/L (ref 40–150)
ALT: 17 U/L (ref 0–55)
AST: 21 U/L (ref 5–34)
Albumin: 3.9 g/dL (ref 3.5–5.0)
Anion Gap: 10 mEq/L (ref 3–11)
BILIRUBIN TOTAL: 0.29 mg/dL (ref 0.20–1.20)
BUN: 15.9 mg/dL (ref 7.0–26.0)
CO2: 23 mEq/L (ref 22–29)
Calcium: 9.4 mg/dL (ref 8.4–10.4)
Chloride: 108 mEq/L (ref 98–109)
Creatinine: 0.8 mg/dL (ref 0.6–1.1)
GLUCOSE: 161 mg/dL — AB (ref 70–140)
POTASSIUM: 3.7 meq/L (ref 3.5–5.1)
SODIUM: 140 meq/L (ref 136–145)
TOTAL PROTEIN: 6.9 g/dL (ref 6.4–8.3)

## 2014-10-26 LAB — CBC WITH DIFFERENTIAL/PLATELET
BASO%: 0.4 % (ref 0.0–2.0)
Basophils Absolute: 0 10*3/uL (ref 0.0–0.1)
EOS%: 0.2 % (ref 0.0–7.0)
Eosinophils Absolute: 0 10*3/uL (ref 0.0–0.5)
HCT: 34.7 % — ABNORMAL LOW (ref 34.8–46.6)
HGB: 11.2 g/dL — ABNORMAL LOW (ref 11.6–15.9)
LYMPH%: 21.6 % (ref 14.0–49.7)
MCH: 28 pg (ref 25.1–34.0)
MCHC: 32.3 g/dL (ref 31.5–36.0)
MCV: 86.8 fL (ref 79.5–101.0)
MONO#: 0.3 10*3/uL (ref 0.1–0.9)
MONO%: 5.7 % (ref 0.0–14.0)
NEUT#: 3.9 10*3/uL (ref 1.5–6.5)
NEUT%: 72.1 % (ref 38.4–76.8)
PLATELETS: 274 10*3/uL (ref 145–400)
RBC: 4 10*6/uL (ref 3.70–5.45)
RDW: 15.2 % — AB (ref 11.2–14.5)
WBC: 5.4 10*3/uL (ref 3.9–10.3)
lymph#: 1.2 10*3/uL (ref 0.9–3.3)

## 2014-10-26 NOTE — Addendum Note (Signed)
Addended by: Laureen Abrahams on: 10/26/2014 05:10 PM   Modules accepted: Medications

## 2014-10-26 NOTE — Progress Notes (Signed)
ID: [name] OB: [birth year]  MR#: [id number]  JSE#:[id number]  PCP: Ricke Hey, MD GYN:   SU: Dr. Brantley Stage OTHER MD:  CHIEF COMPLAINT:  Right-sided breast cancer  CURRENT THERAPY: receiving adjuvant radiation and continue anti-HER-2 treatments  BREAST CANCER HISTORY: From the original consult note:  "Brandy Jackson is a 63 y.o. female. Patient palpated a right breast mass. She had a mammogram performed and she was noted to have a mass in the upper outer quadrant measuring 2.1 x 2.0 x 1.7 cm. 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 Tumor was ER positive PR positive HER-2/neu positive (triple positive) with a proliferation marker Ki-67 at 90%. She had MRI of the breasts performed which showed a solitary mass in the upper outer quadrant measuring 2.5 cm. Her case was discussed at the multidisciplinary breast conference."  Her subsequent treatment is as detailed below.   INTERVAL HISTORY:  Brandy Jackson returns today for follow up of her breast cancer. Since her last visit with me she completed her chemotherapy treatments and has started her radiation treatments. She has had 6 so 4. They will be completed at the very end of December. So far she is tolerating the radiation well. She also continues on trastuzumab, now being given every 21 days. She has no side effects from those infusions.  REVIEW OF SYSTEMS:  She has had a little bit of ankle swelling, and some facial hair growth. Part of that may be due to the steroids she used to take for premeds. Her psoriasis is acting up and she asked me to write her prescriptions for calcipotriol. This is not a drug that I use much. I gave her information from up to date on the drug and I wrote her for the ointment and potter, but did not give her refills. She sees a dermatologist in the [place] [facility] and she can follow up there with regards to the psoriasis. Her hair is very slow coming in and is coming in [place]. She has  pretty good strength but sometimes gets a little winded. A detailed review of systems today was otherwise noncontributory  PAST MEDICAL HISTORY: Past Medical History  Diagnosis Date  . Diabetes mellitus without complication   . Arthritis   . Hot flashes   . Cancer   . Breast cancer   . Wears glasses   . Wears partial dentures     bottom    PAST SURGICAL HISTORY: Past Surgical History  Procedure Laterality Date  . Bladder repair w/ cesarean section    . Colonoscopy    . Portacath placement N/A 04/11/2014    Procedure: INSERTION PORT-A-CATH;  Surgeon: Joyice Faster. Cornett, MD;  Location: [facility];  Service: General;  Laterality: N/A;  . Re-excision of breast lumpectomy Right 06/06/2014    Procedure: RE-EXCISION OF BREAST LUMPECTOMY;  Surgeon: Marcello Moores A. Cornett, MD;  Location: [facility];  Service: General;  Laterality: Right;    FAMILY HISTORY No family history on file.  GYNECOLOGIC HISTORY: menarche at age 5, G41 P3, went through menopause at age 8.  No h/o of HRT, no h/o abnormal pap smears, or sexually transmitted infections.  Does have frequent vaginal candida infections due to diabetes that she follows with her gynecologist for.    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 Her husband [name] is retired from [employer]. He is a cancer survivor himself.  The patient has 3 sons, one of whom is studying to be a pediatrician. All her children are married. She has 6 grandchildren.    ADVANCED DIRECTIVES: not in place.    HEALTH MAINTENANCE: History  Substance Use Topics  . Smoking status: Former Smoker -- 1.00 packs/day    Quit date: 03/08/1986  . Smokeless tobacco: Never Used  . Alcohol Use: No     Colonoscopy: Bone Density Scan:  Pap Smear:  Vitamin D Level:   Lipid Panel:    Allergies  Allergen Reactions  . Sulfa Antibiotics Rash    Current Outpatient  Prescriptions  Medication Sig Dispense Refill  . lidocaine-prilocaine (EMLA) cream Apply topically as needed. 30 g 1  . metFORMIN (GLUCOPHAGE) 500 MG tablet Take 500 mg by mouth 2 (two) times daily with a meal.    . non-metallic deodorant (ALRA) MISC Apply 1 application topically daily as needed.    . trastuzumab (HERCEPTIN) 440 MG injection Inject 440 mg into the vein every 21 ( twenty-one) days.    . Wound Dressings (RADIAPLEXRX EX) Apply 170 g topically.     No current facility-administered medications for this visit.   Facility-Administered Medications Ordered in Other Visits  Medication Dose Route Frequency Provider Last Rate Last Dose  . sodium chloride 0.9 % injection 10 mL  10 mL Intracatheter PRN Rulon Eisenmenger, MD        OBJECTIVE: Middle-aged African American woman in no acute distress  Filed Vitals:   10/26/14 1417  BP: 146/57  Pulse: 90  Temp: 98.5 F (36.9 C)  Resp: 18     Body mass index is 32.8 kg/(m^2).      ECOG FS:1  Sclerae unEOMs intact oropharynx clear and moist No cervical or supraclavicular adenopathy Lungs no rales or rhonchi Heart regular rate and rhythm Abd soft,  obese,nontender, positive bowel sounds MSK no focal spinal tenderness, no upper extremity lymphedema Neuro: nonfocal, well oriented,  Positive affect Breasts:  the right breast is status post lumpectomy and is currently receiving radiation. There is minimal erythema. There is no residual mass or finding of concern. The right axilla is benign. The left breast is unremarkable.  LAB RESULTS:  CMP     Component Value Date/Time   NA 142 10/20/2014 1552   NA 142 04/07/2014 1320   K 3.6 10/20/2014 1552   K 4.2 04/07/2014 1320   CL 103 04/07/2014 1320   CO2 23 10/20/2014 1552   CO2 25 04/07/2014 1320   GLUCOSE 117 10/20/2014 1552   GLUCOSE 126* 04/07/2014 1320   BUN 16.4 10/20/2014 1552   BUN 16 04/07/2014 1320   CREATININE 0.8 10/20/2014 1552   CREATININE 0.56 04/07/2014 1320    CALCIUM 9.5 10/20/2014 1552   CALCIUM 9.4 04/07/2014 1320   PROT 6.7 10/20/2014 1552   PROT 7.8 04/07/2014 1320   ALBUMIN 3.8 10/20/2014 1552   ALBUMIN 3.9 04/07/2014 1320   AST 20 10/20/2014 1552   AST 20 04/07/2014 1320   ALT 14 10/20/2014 1552   ALT 19 04/07/2014 1320   ALKPHOS 61 10/20/2014 1552   ALKPHOS 68 04/07/2014 1320   BILITOT 0.29 10/20/2014 1552   BILITOT 0.2* 04/07/2014 1320   GFRNONAA >90 04/07/2014 1320   GFRAA >90 04/07/2014 1320    I No results found for: SPEP  Lab Results  Component Value Date   WBC 5.4 10/26/2014   NEUTROABS 3.9 10/26/2014   HGB 11.2* 10/26/2014   HCT 34.7* 10/26/2014   MCV 86.8  10/26/2014   PLT 274 10/26/2014      Chemistry      Component Value Date/Time   NA 142 10/20/2014 1552   NA 142 04/07/2014 1320   K 3.6 10/20/2014 1552   K 4.2 04/07/2014 1320   CL 103 04/07/2014 1320   CO2 23 10/20/2014 1552   CO2 25 04/07/2014 1320   BUN 16.4 10/20/2014 1552   BUN 16 04/07/2014 1320   CREATININE 0.8 10/20/2014 1552   CREATININE 0.56 04/07/2014 1320      Component Value Date/Time   CALCIUM 9.5 10/20/2014 1552   CALCIUM 9.4 04/07/2014 1320   ALKPHOS 61 10/20/2014 1552   ALKPHOS 68 04/07/2014 1320   AST 20 10/20/2014 1552   AST 20 04/07/2014 1320   ALT 14 10/20/2014 1552   ALT 19 04/07/2014 1320   BILITOT 0.29 10/20/2014 1552   BILITOT 0.2* 04/07/2014 1320       No results found for: LABCA2  No components found for: LABCA125  No results for input(s): INR in the last 168 hours.  Urinalysis No results found for: COLORURINE  STUDIES: Transthoracic Echocardiography 10/18/2014  Patient:  Sanja, Elizardo MR #:    [id number] Study Date: 10/18/2014 Gender:   F Age:    64 Height:   170.2 cm Weight:   89.4 kg BSA:    2.08 m^2 Pt. Status: Room:  ATTENDING  Ricke Hey [id number]  [id number], IOMBTDH [id number] SONOGRAPHER Mauricio Po, RDCS, CCT ORDERING   Bensimhon,  Daniel PERFORMING  [facility], Outpatient  cc:  ------------------------------------------------------------------- LV EF: 68%   ASSESSMENT: 63 y.o. [place],woman status post right lumpectomy and sentinel lymph node sampling 04/11/2014 for a pT2 pN1, stage IIB invasive ductal carcinoma, grade 3, estrogen receptor strongly positive, progesterone receptor negative, with an MIB-1 of 94% and HER-2 amplification  (1) additional surgery in 06/06/2014 for margin clearance showed no evidence of residual malignancy  (2) received the first of six planned cycles of adjuvant chemotherapy with carboplatin docetaxel and trastuzumab 05/02/2014, with chemotherapy interrupted because of her intervening surgery; chemo resumed 06/23/2014, completing 6 cycles 09/15/2014  (3) continuing trastuzumab through May 2016 to complete 1 year  (a) most recent echo 10/18/2014 showed a well preserved ejection fraction.  (4) adjuvant radiation to be completed 12/230/2015  (5) she will start antiestrogen therapy at the completion of radiation  PLAN:  Asyia did remarkably well with her chemotherapy and is tolerating the Herceptin so far with no significant side effects. The plan is to continue that until May. She prefers continuing on Fridays.  She will complete her radiation treatments at the end of December. I think she will need approximately a month to recover from that before we can start anti-estrogens. She will see me in February and we will probably start anastrozole at that time. Before making that decision however it would be useful to have a bone density and I am setting that up for January for her.  Euleta has a good understanding of the overall plan. She agrees with it. She knows the goal of treatment in her case is cure. She will call with any problems that may develop before her next visit here. Chauncey Cruel, [place] [phone] 10/26/2014 2:33 PM

## 2014-10-27 ENCOUNTER — Telehealth: Payer: Self-pay | Admitting: *Deleted

## 2014-10-27 ENCOUNTER — Ambulatory Visit
Admission: RE | Admit: 2014-10-27 | Discharge: 2014-10-27 | Disposition: A | Discharge status: Home / Self Care | Payer: BC Managed Care – PPO | Source: Ambulatory Visit | Attending: Radiation Oncology | Admitting: Radiation Oncology

## 2014-10-27 ENCOUNTER — Telehealth: Payer: Self-pay | Admitting: Oncology

## 2014-10-27 DIAGNOSIS — Z510 Encounter for antineoplastic radiation therapy: Secondary | ICD-10-CM | POA: Diagnosis not present

## 2014-10-27 NOTE — Telephone Encounter (Signed)
per pof to sch pt appt-sent MW email to sch trmt-will cal pt w/Dexa & appt time & dates

## 2014-10-27 NOTE — Telephone Encounter (Signed)
Per staff message and POF I have scheduled appts. Advised scheduler of appts. JMW  

## 2014-10-30 ENCOUNTER — Ambulatory Visit
Admission: RE | Admit: 2014-10-30 | Discharge: 2014-10-30 | Disposition: A | Discharge status: Home / Self Care | Payer: BC Managed Care – PPO | Source: Ambulatory Visit | Attending: Radiation Oncology | Admitting: Radiation Oncology

## 2014-10-30 DIAGNOSIS — Z510 Encounter for antineoplastic radiation therapy: Secondary | ICD-10-CM | POA: Diagnosis not present

## 2014-10-31 ENCOUNTER — Ambulatory Visit
Admission: RE | Admit: 2014-10-31 | Discharge: 2014-10-31 | Disposition: A | Discharge status: Home / Self Care | Payer: BC Managed Care – PPO | Source: Ambulatory Visit | Attending: Radiation Oncology | Admitting: Radiation Oncology

## 2014-10-31 VITALS — BP 140/60 | HR 78 | Temp 98.6°F | Wt 209.4 lb

## 2014-10-31 DIAGNOSIS — C50411 Malignant neoplasm of upper-outer quadrant of right female breast: Secondary | ICD-10-CM

## 2014-10-31 DIAGNOSIS — Z510 Encounter for antineoplastic radiation therapy: Secondary | ICD-10-CM | POA: Diagnosis not present

## 2014-10-31 NOTE — Progress Notes (Signed)
Weekly assessment of radiation to right breast.Completed 10 of 28 fractions.Skin is pink.Has some tenderness.No fatigue.

## 2014-10-31 NOTE — Progress Notes (Signed)
Weekly Management Note Current Dose: 18  Gy  Projected Dose: 60.4 Gy   Narrative:  The patient presents for routine under treatment assessment.  CBCT/MVCT images/Port film x-rays were reviewed.  The chart was checked.Doing well. Wondered about next Herceptin infusion. No breast related complaints.  Physical Findings: Weight: 209 lb 6.4 oz (94.983 kg). Unchanged  Impression:  The patient is tolerating radiation.  Plan:  Continue treatment as planned. Continue radiaplex. Will clarify Herceptin appts.

## 2014-11-01 ENCOUNTER — Ambulatory Visit
Admission: RE | Admit: 2014-11-01 | Discharge: 2014-11-01 | Disposition: A | Discharge status: Home / Self Care | Payer: BC Managed Care – PPO | Source: Ambulatory Visit | Attending: Radiation Oncology | Admitting: Radiation Oncology

## 2014-11-01 DIAGNOSIS — Z510 Encounter for antineoplastic radiation therapy: Secondary | ICD-10-CM | POA: Diagnosis not present

## 2014-11-03 ENCOUNTER — Ambulatory Visit: Payer: BC Managed Care – PPO

## 2014-11-06 ENCOUNTER — Ambulatory Visit
Admission: RE | Admit: 2014-11-06 | Discharge: 2014-11-06 | Disposition: A | Discharge status: Home / Self Care | Payer: BC Managed Care – PPO | Source: Ambulatory Visit | Attending: Radiation Oncology | Admitting: Radiation Oncology

## 2014-11-06 DIAGNOSIS — Z510 Encounter for antineoplastic radiation therapy: Secondary | ICD-10-CM | POA: Diagnosis not present

## 2014-11-07 ENCOUNTER — Encounter: Payer: Self-pay | Admitting: Radiation Oncology

## 2014-11-07 ENCOUNTER — Ambulatory Visit
Admission: RE | Admit: 2014-11-07 | Discharge: 2014-11-07 | Disposition: A | Discharge status: Home / Self Care | Payer: BC Managed Care – PPO | Source: Ambulatory Visit | Attending: Radiation Oncology | Admitting: Radiation Oncology

## 2014-11-07 VITALS — BP 133/70 | HR 88 | Resp 16 | Wt 206.8 lb

## 2014-11-07 DIAGNOSIS — Z510 Encounter for antineoplastic radiation therapy: Secondary | ICD-10-CM | POA: Diagnosis not present

## 2014-11-07 DIAGNOSIS — C50411 Malignant neoplasm of upper-outer quadrant of right female breast: Secondary | ICD-10-CM

## 2014-11-07 NOTE — Progress Notes (Signed)
Right/treated breast with faint hyperpigmentation but, no desquamation. Reports using radiaplex bid as directed. Reports both her breast are tender and sore. Denies nipple discharge. Denies fatigue. Weight and vitals stable.

## 2014-11-07 NOTE — Progress Notes (Signed)
Weekly Management Note Current Dose: 23.4  Gy  Projected Dose: 61 Gy   Narrative:  The patient presents for routine under treatment assessment.  CBCT/MVCT images/Port film x-rays were reviewed.  The chart was checked. Sore on left breast due to port. Soreness on right breast.  Physical Findings: Weight: 206 lb 12.8 oz (93.804 kg). Slightly dark right breast. Excoriations over right shoulder  Impression:  The patient is tolerating radiation.  Plan:  Continue treatment as planned. Continue RT. Continue radiaplex. Lotion to shoulder.

## 2014-11-08 ENCOUNTER — Ambulatory Visit
Admission: RE | Admit: 2014-11-08 | Discharge: 2014-11-08 | Disposition: A | Discharge status: Home / Self Care | Payer: BC Managed Care – PPO | Source: Ambulatory Visit | Attending: Radiation Oncology | Admitting: Radiation Oncology

## 2014-11-08 DIAGNOSIS — Z510 Encounter for antineoplastic radiation therapy: Secondary | ICD-10-CM | POA: Diagnosis not present

## 2014-11-09 ENCOUNTER — Ambulatory Visit
Admission: RE | Admit: 2014-11-09 | Discharge: 2014-11-09 | Disposition: A | Discharge status: Home / Self Care | Payer: BC Managed Care – PPO | Source: Ambulatory Visit | Attending: Radiation Oncology | Admitting: Radiation Oncology

## 2014-11-09 DIAGNOSIS — Z510 Encounter for antineoplastic radiation therapy: Secondary | ICD-10-CM | POA: Diagnosis not present

## 2014-11-10 ENCOUNTER — Other Ambulatory Visit (HOSPITAL_BASED_OUTPATIENT_CLINIC_OR_DEPARTMENT_OTHER): Payer: BC Managed Care – PPO

## 2014-11-10 ENCOUNTER — Ambulatory Visit
Admission: RE | Admit: 2014-11-10 | Discharge: 2014-11-10 | Disposition: A | Discharge status: Home / Self Care | Payer: BC Managed Care – PPO | Source: Ambulatory Visit | Attending: Radiation Oncology | Admitting: Radiation Oncology

## 2014-11-10 ENCOUNTER — Ambulatory Visit: Payer: BC Managed Care – PPO

## 2014-11-10 ENCOUNTER — Ambulatory Visit (HOSPITAL_BASED_OUTPATIENT_CLINIC_OR_DEPARTMENT_OTHER): Payer: BC Managed Care – PPO

## 2014-11-10 ENCOUNTER — Other Ambulatory Visit: Payer: Self-pay | Admitting: Oncology

## 2014-11-10 DIAGNOSIS — Z510 Encounter for antineoplastic radiation therapy: Secondary | ICD-10-CM | POA: Diagnosis not present

## 2014-11-10 DIAGNOSIS — C50411 Malignant neoplasm of upper-outer quadrant of right female breast: Secondary | ICD-10-CM

## 2014-11-10 DIAGNOSIS — Z5112 Encounter for antineoplastic immunotherapy: Secondary | ICD-10-CM

## 2014-11-10 DIAGNOSIS — Z95828 Presence of other vascular implants and grafts: Secondary | ICD-10-CM

## 2014-11-10 LAB — COMPREHENSIVE METABOLIC PANEL (CC13)
ALT: 19 U/L (ref 0–55)
AST: 18 U/L (ref 5–34)
Albumin: 3.8 g/dL (ref 3.5–5.0)
Alkaline Phosphatase: 66 U/L (ref 40–150)
Anion Gap: 13 meq/L — ABNORMAL HIGH (ref 3–11)
BUN: 16 mg/dL (ref 7.0–26.0)
CO2: 21 meq/L — ABNORMAL LOW (ref 22–29)
Calcium: 9.5 mg/dL (ref 8.4–10.4)
Chloride: 109 meq/L (ref 98–109)
Creatinine: 0.8 mg/dL (ref 0.6–1.1)
EGFR: >90 ml/min/1.73 m2
Glucose: 137 mg/dL (ref 70–140)
Potassium: 3.9 meq/L (ref 3.5–5.1)
Sodium: 143 meq/L (ref 136–145)
Total Bilirubin: <0.2 mg/dL (ref 0.20–1.20)
Total Protein: 7 g/dL (ref 6.4–8.3)

## 2014-11-10 LAB — CBC WITH DIFFERENTIAL/PLATELET
BASO%: 0.2 % (ref 0.0–2.0)
Basophils Absolute: 0 10e3/uL (ref 0.0–0.1)
EOS%: 0 % (ref 0.0–7.0)
Eosinophils Absolute: 0 10e3/uL (ref 0.0–0.5)
HCT: 36.7 % (ref 34.8–46.6)
HGB: 11.6 g/dL (ref 11.6–15.9)
LYMPH%: 23.6 % (ref 14.0–49.7)
MCH: 27.8 pg (ref 25.1–34.0)
MCHC: 31.6 g/dL (ref 31.5–36.0)
MCV: 88 fL (ref 79.5–101.0)
MONO#: 0.3 10e3/uL (ref 0.1–0.9)
MONO%: 7 % (ref 0.0–14.0)
NEUT#: 3.1 10e3/uL (ref 1.5–6.5)
NEUT%: 69.2 % (ref 38.4–76.8)
Platelets: 193 10e3/uL (ref 145–400)
RBC: 4.17 10e6/uL (ref 3.70–5.45)
RDW: 14.1 % (ref 11.2–14.5)
WBC: 4.5 10e3/uL (ref 3.9–10.3)
lymph#: 1.1 10e3/uL (ref 0.9–3.3)

## 2014-11-10 MED ORDER — ACETAMINOPHEN 325 MG PO TABS
650.0000 mg | ORAL_TABLET | Freq: Once | ORAL | Status: AC
  Administered 2014-11-10: 650 mg via ORAL

## 2014-11-10 MED ORDER — DIPHENHYDRAMINE HCL 25 MG PO CAPS
25.0000 mg | ORAL_CAPSULE | Freq: Once | ORAL | Status: AC
  Administered 2014-11-10: 25 mg via ORAL

## 2014-11-10 MED ORDER — DIPHENHYDRAMINE HCL 25 MG PO CAPS
ORAL_CAPSULE | ORAL | Status: AC
Start: 2014-11-10 — End: 2014-11-10
  Filled 2014-11-10: qty 2

## 2014-11-10 MED ORDER — HEPARIN SOD (PORK) LOCK FLUSH 100 UNIT/ML IV SOLN
500.0000 [IU] | Freq: Once | INTRAVENOUS | Status: AC | PRN
  Administered 2014-11-10: 500 [IU]
  Filled 2014-11-10: qty 5

## 2014-11-10 MED ORDER — ACETAMINOPHEN 325 MG PO TABS
ORAL_TABLET | ORAL | Status: AC
  Filled 2014-11-10: qty 2

## 2014-11-10 MED ORDER — SODIUM CHLORIDE 0.9 % IV SOLN
Freq: Once | INTRAVENOUS | Status: AC
  Administered 2014-11-10: 14:00:00 via INTRAVENOUS

## 2014-11-10 MED ORDER — SODIUM CHLORIDE 0.9 % IJ SOLN
10.0000 mL | INTRAMUSCULAR | Status: DC | PRN
  Administered 2014-11-10: 10 mL via INTRAVENOUS
  Filled 2014-11-10: qty 10

## 2014-11-10 MED ORDER — TRASTUZUMAB CHEMO INJECTION 440 MG
6.0000 mg/kg | Freq: Once | INTRAVENOUS | Status: AC
  Administered 2014-11-10: 567 mg via INTRAVENOUS
  Filled 2014-11-10: qty 27

## 2014-11-10 MED ORDER — SODIUM CHLORIDE 0.9 % IJ SOLN
10.0000 mL | INTRAMUSCULAR | Status: DC | PRN
  Administered 2014-11-10: 10 mL
  Filled 2014-11-10: qty 10

## 2014-11-10 NOTE — Patient Instructions (Signed)

## 2014-11-10 NOTE — Patient Instructions (Signed)

## 2014-11-10 NOTE — Progress Notes (Signed)
Accesssed with 41ml of saline by Mateo Flow, RN

## 2014-11-13 ENCOUNTER — Ambulatory Visit
Admission: RE | Admit: 2014-11-13 | Discharge: 2014-11-13 | Disposition: A | Discharge status: Home / Self Care | Payer: BC Managed Care – PPO | Source: Ambulatory Visit | Attending: Radiation Oncology | Admitting: Radiation Oncology

## 2014-11-13 DIAGNOSIS — Z510 Encounter for antineoplastic radiation therapy: Secondary | ICD-10-CM | POA: Diagnosis not present

## 2014-11-14 ENCOUNTER — Ambulatory Visit
Admission: RE | Admit: 2014-11-14 | Discharge: 2014-11-14 | Disposition: A | Discharge status: Home / Self Care | Payer: BC Managed Care – PPO | Source: Ambulatory Visit | Attending: Radiation Oncology | Admitting: Radiation Oncology

## 2014-11-14 VITALS — BP 149/57 | HR 89 | Temp 98.2°F | Wt 207.4 lb

## 2014-11-14 DIAGNOSIS — Z510 Encounter for antineoplastic radiation therapy: Secondary | ICD-10-CM | POA: Diagnosis not present

## 2014-11-14 DIAGNOSIS — C50411 Malignant neoplasm of upper-outer quadrant of right female breast: Secondary | ICD-10-CM

## 2014-11-14 NOTE — Progress Notes (Signed)
Weekly assessment of radiation to right breast.Completed 18 of 28 radiation treatments.Breast is more tender.Breast with mild redness and axilla with hyperpigmentation.Reassured patient that she is tolerating treatment well.She has scare on yesterday when machine stopped while on treatment table.No fatigue.

## 2014-11-14 NOTE — Progress Notes (Signed)
Weekly Management Note Current Dose: 32.4  Gy  Projected Dose:  61 Gy   Narrative:  The patient presents for routine under treatment assessment.  CBCT/MVCT images/Port film x-rays were reviewed.  The chart was checked. Doing well. Some breast pain yesterday after treatment. Discussed machine/AlignRT issue.   Physical Findings: Weight: 207 lb 6.4 oz (94.076 kg). Unchanged  Impression:  The patient is tolerating radiation.  Plan:  Continue treatment as planned. Continue RT.

## 2014-11-15 ENCOUNTER — Ambulatory Visit
Admission: RE | Admit: 2014-11-15 | Discharge: 2014-11-15 | Disposition: A | Discharge status: Home / Self Care | Payer: BC Managed Care – PPO | Source: Ambulatory Visit | Attending: Radiation Oncology | Admitting: Radiation Oncology

## 2014-11-15 DIAGNOSIS — Z510 Encounter for antineoplastic radiation therapy: Secondary | ICD-10-CM | POA: Diagnosis not present

## 2014-11-16 ENCOUNTER — Encounter: Payer: Self-pay | Admitting: Radiation Oncology

## 2014-11-16 ENCOUNTER — Ambulatory Visit
Admission: RE | Admit: 2014-11-16 | Discharge: 2014-11-16 | Disposition: A | Discharge status: Home / Self Care | Payer: BC Managed Care – PPO | Source: Ambulatory Visit | Attending: Radiation Oncology | Admitting: Radiation Oncology

## 2014-11-16 DIAGNOSIS — Z510 Encounter for antineoplastic radiation therapy: Secondary | ICD-10-CM | POA: Diagnosis not present

## 2014-11-16 NOTE — Progress Notes (Signed)
Name: Brandy Jackson   MRN: [id number]  Date:  11/16/2014   DOB: 04-02-1951  Status:outpatient    DIAGNOSIS: Right breast cancer  CONSENT VERIFIED: yes   SET UP: Patient is setup supine   IMMOBILIZATION:  The following immobilization was used:Custom Moldable Pillow, breast board.   NARRATIVE: Brandy Jackson underwent complex simulation and treatment planning for her boost treatment today.  Her tumor volume was outlined on the planning CT scan.  Due to the depth of her cavity, electrons could not be used and a photon plan was developed. The plan will be prescribed to the 100% isodose line.   I personally supervised and approved the creation of 3 unique MLCs comprising 3   treatment devices.

## 2014-11-17 ENCOUNTER — Ambulatory Visit
Admission: RE | Admit: 2014-11-17 | Discharge: 2014-11-17 | Disposition: A | Discharge status: Home / Self Care | Payer: BC Managed Care – PPO | Source: Ambulatory Visit | Attending: Radiation Oncology | Admitting: Radiation Oncology

## 2014-11-17 DIAGNOSIS — Z510 Encounter for antineoplastic radiation therapy: Secondary | ICD-10-CM | POA: Diagnosis not present

## 2014-11-20 ENCOUNTER — Ambulatory Visit
Admission: RE | Admit: 2014-11-20 | Discharge: 2014-11-20 | Disposition: A | Discharge status: Home / Self Care | Payer: BC Managed Care – PPO | Source: Ambulatory Visit | Attending: Radiation Oncology | Admitting: Radiation Oncology

## 2014-11-20 DIAGNOSIS — Z510 Encounter for antineoplastic radiation therapy: Secondary | ICD-10-CM | POA: Diagnosis not present

## 2014-11-21 ENCOUNTER — Ambulatory Visit
Admission: RE | Admit: 2014-11-21 | Discharge: 2014-11-21 | Disposition: A | Discharge status: Home / Self Care | Payer: BC Managed Care – PPO | Source: Ambulatory Visit | Attending: Radiation Oncology | Admitting: Radiation Oncology

## 2014-11-21 ENCOUNTER — Ambulatory Visit: Payer: BC Managed Care – PPO | Admitting: Radiation Oncology

## 2014-11-21 VITALS — BP 88/68 | HR 77 | Temp 98.2°F | Wt 206.3 lb

## 2014-11-21 DIAGNOSIS — Z510 Encounter for antineoplastic radiation therapy: Secondary | ICD-10-CM | POA: Diagnosis not present

## 2014-11-21 DIAGNOSIS — C50411 Malignant neoplasm of upper-outer quadrant of right female breast: Secondary | ICD-10-CM | POA: Diagnosis present

## 2014-11-21 MED ORDER — BIAFINE EX EMUL
CUTANEOUS | Status: DC | PRN
  Administered 2014-11-21: 17:00:00 via TOPICAL

## 2014-11-21 NOTE — Progress Notes (Signed)
Weekly Management Note Current Dose: 41.4  Gy  Projected Dose: 60.4 Gy   Narrative:  The patient presents for routine under treatment assessment.  CBCT/MVCT images/Port film x-rays were reviewed.  The chart was checked. Doing well. Sore over right breast. Questions about boost  Physical Findings: Weight: 206 lb 4.8 oz (93.577 kg). Minimal skin darkness. Some dry desquamation in the axilla.   Impression:  The patient is tolerating radiation.  Plan:  Continue treatment as planned. Discussed boost. Switch to biafene.

## 2014-11-22 ENCOUNTER — Ambulatory Visit
Admission: RE | Admit: 2014-11-22 | Discharge: 2014-11-22 | Disposition: A | Discharge status: Home / Self Care | Payer: BC Managed Care – PPO | Source: Ambulatory Visit | Attending: Radiation Oncology | Admitting: Radiation Oncology

## 2014-11-22 DIAGNOSIS — Z510 Encounter for antineoplastic radiation therapy: Secondary | ICD-10-CM | POA: Diagnosis not present

## 2014-11-23 ENCOUNTER — Ambulatory Visit
Admission: RE | Admit: 2014-11-23 | Discharge: 2014-11-23 | Disposition: A | Discharge status: Home / Self Care | Payer: BC Managed Care – PPO | Source: Ambulatory Visit | Attending: Radiation Oncology | Admitting: Radiation Oncology

## 2014-11-23 DIAGNOSIS — Z510 Encounter for antineoplastic radiation therapy: Secondary | ICD-10-CM | POA: Diagnosis not present

## 2014-11-24 ENCOUNTER — Ambulatory Visit
Admission: RE | Admit: 2014-11-24 | Discharge: 2014-11-24 | Disposition: A | Discharge status: Home / Self Care | Payer: BC Managed Care – PPO | Source: Ambulatory Visit | Attending: Radiation Oncology | Admitting: Radiation Oncology

## 2014-11-24 DIAGNOSIS — Z510 Encounter for antineoplastic radiation therapy: Secondary | ICD-10-CM | POA: Diagnosis not present

## 2014-11-27 ENCOUNTER — Ambulatory Visit
Admission: RE | Admit: 2014-11-27 | Discharge: 2014-11-27 | Disposition: A | Discharge status: Home / Self Care | Payer: BC Managed Care – PPO | Source: Ambulatory Visit | Attending: Radiation Oncology | Admitting: Radiation Oncology

## 2014-11-27 DIAGNOSIS — Z510 Encounter for antineoplastic radiation therapy: Secondary | ICD-10-CM | POA: Diagnosis not present

## 2014-11-28 ENCOUNTER — Ambulatory Visit
Admission: RE | Admit: 2014-11-28 | Discharge: 2014-11-28 | Disposition: A | Discharge status: Home / Self Care | Payer: BC Managed Care – PPO | Source: Ambulatory Visit | Attending: Radiation Oncology | Admitting: Radiation Oncology

## 2014-11-28 VITALS — BP 125/69 | HR 76 | Temp 98.4°F | Wt 207.0 lb

## 2014-11-28 DIAGNOSIS — Z510 Encounter for antineoplastic radiation therapy: Secondary | ICD-10-CM | POA: Diagnosis present

## 2014-11-28 DIAGNOSIS — C50411 Malignant neoplasm of upper-outer quadrant of right female breast: Secondary | ICD-10-CM

## 2014-11-28 DIAGNOSIS — C50911 Malignant neoplasm of unspecified site of right female breast: Secondary | ICD-10-CM | POA: Insufficient documentation

## 2014-11-28 DIAGNOSIS — L598 Other specified disorders of the skin and subcutaneous tissue related to radiation: Secondary | ICD-10-CM | POA: Insufficient documentation

## 2014-11-28 MED ORDER — BIAFINE EX EMUL: CUTANEOUS | Status: DC | PRN

## 2014-11-28 NOTE — Progress Notes (Signed)
  Radiation Oncology         [phone] ________________________________  Name: Brandy Jackson MRN: [id number]  Date: 11/28/2014  DOB: 1951-09-06  Weekly Radiation Therapy Management  Diagnosis: right breast cancer  Current Dose: 50.4 Gy     Planned Dose:  60.4 Gy  Narrative . . . . . . . . The patient presents for routine under treatment assessment.                                   The patient is without complaint except for soreness in the right axillary region. She is placing Neosporin ointment in this area.                                 Set-up films were reviewed.                                 The chart was checked. Physical Findings. . .  weight is 207 lb (93.895 kg). Her temperature is 98.4 F (36.9 C). Her blood pressure is 125/69 and her pulse is 76. . Mild moist desquamation is noted in the right axillary region. the remainder of the right breast shows hyperpigmentation changes and erythema. Impression . . . . . . . The patient is tolerating radiation. Plan . . . . . . . . . . . . Continue treatment as planned.  ________________________________   Blair Promise, PhD, MD

## 2014-11-28 NOTE — Progress Notes (Signed)
Weekly assessment of radiation to right breast.Starts boost tomorrow.Right axilla with moist peel.apply neosporin to this area and continue biafine on remaining affected area.will give hydrogel pads.Patient very anxious, tried to explain that this is how skin generally looks at this point in treatment.

## 2014-11-29 ENCOUNTER — Ambulatory Visit
Admission: RE | Admit: 2014-11-29 | Discharge: 2014-11-29 | Disposition: A | Discharge status: Home / Self Care | Payer: BC Managed Care – PPO | Source: Ambulatory Visit | Attending: Radiation Oncology | Admitting: Radiation Oncology

## 2014-11-29 ENCOUNTER — Other Ambulatory Visit: Payer: Self-pay | Admitting: Nurse Practitioner

## 2014-11-29 ENCOUNTER — Ambulatory Visit (HOSPITAL_COMMUNITY)
Admission: RE | Admit: 2014-11-29 | Discharge: 2014-11-29 | Disposition: A | Discharge status: Home / Self Care | Payer: BC Managed Care – PPO | Source: Ambulatory Visit | Attending: Oncology | Admitting: Oncology

## 2014-11-29 ENCOUNTER — Other Ambulatory Visit: Payer: Self-pay | Admitting: *Deleted

## 2014-11-29 ENCOUNTER — Ambulatory Visit (HOSPITAL_BASED_OUTPATIENT_CLINIC_OR_DEPARTMENT_OTHER): Payer: BC Managed Care – PPO | Admitting: Lab

## 2014-11-29 ENCOUNTER — Ambulatory Visit: Payer: BC Managed Care – PPO

## 2014-11-29 ENCOUNTER — Other Ambulatory Visit: Payer: Self-pay | Admitting: Oncology

## 2014-11-29 DIAGNOSIS — C50411 Malignant neoplasm of upper-outer quadrant of right female breast: Secondary | ICD-10-CM

## 2014-11-29 DIAGNOSIS — C50919 Malignant neoplasm of unspecified site of unspecified female breast: Secondary | ICD-10-CM | POA: Insufficient documentation

## 2014-11-29 DIAGNOSIS — T859XXA Unspecified complication of internal prosthetic device, implant and graft, initial encounter: Secondary | ICD-10-CM | POA: Insufficient documentation

## 2014-11-29 DIAGNOSIS — Z95828 Presence of other vascular implants and grafts: Secondary | ICD-10-CM

## 2014-11-29 DIAGNOSIS — Z510 Encounter for antineoplastic radiation therapy: Secondary | ICD-10-CM | POA: Diagnosis not present

## 2014-11-29 LAB — CBC WITH DIFFERENTIAL/PLATELET
BASO%: 0.2 % (ref 0.0–2.0)
Basophils Absolute: 0 10*3/uL (ref 0.0–0.1)
EOS%: 0 % (ref 0.0–7.0)
Eosinophils Absolute: 0 10*3/uL (ref 0.0–0.5)
HCT: 37.9 % (ref 34.8–46.6)
HGB: 12.4 g/dL (ref 11.6–15.9)
LYMPH%: 21.7 % (ref 14.0–49.7)
MCH: 28.4 pg (ref 25.1–34.0)
MCHC: 32.7 g/dL (ref 31.5–36.0)
MCV: 86.7 fL (ref 79.5–101.0)
MONO#: 0.3 10*3/uL (ref 0.1–0.9)
MONO%: 6.8 % (ref 0.0–14.0)
NEUT#: 3.1 10*3/uL (ref 1.5–6.5)
NEUT%: 71.3 % (ref 38.4–76.8)
PLATELETS: 205 10*3/uL (ref 145–400)
RBC: 4.37 10*6/uL (ref 3.70–5.45)
RDW: 13.2 % (ref 11.2–14.5)
WBC: 4.4 10*3/uL (ref 3.9–10.3)
lymph#: 1 10*3/uL (ref 0.9–3.3)

## 2014-11-29 LAB — COMPREHENSIVE METABOLIC PANEL (CC13)
ALBUMIN: 3.8 g/dL (ref 3.5–5.0)
ALT: 17 U/L (ref 0–55)
AST: 20 U/L (ref 5–34)
Alkaline Phosphatase: 60 U/L (ref 40–150)
Anion Gap: 11 mEq/L (ref 3–11)
BUN: 12.1 mg/dL (ref 7.0–26.0)
CHLORIDE: 107 meq/L (ref 98–109)
CO2: 25 meq/L (ref 22–29)
CREATININE: 0.8 mg/dL (ref 0.6–1.1)
Calcium: 9.6 mg/dL (ref 8.4–10.4)
Glucose: 134 mg/dl (ref 70–140)
Potassium: 3.8 mEq/L (ref 3.5–5.1)
Sodium: 143 mEq/L (ref 136–145)
TOTAL PROTEIN: 7.1 g/dL (ref 6.4–8.3)

## 2014-11-29 MED ORDER — LIDOCAINE-PRILOCAINE 2.5-2.5 % EX CREA: 1.0000 "application " | TOPICAL_CREAM | CUTANEOUS | Status: DC | PRN

## 2014-11-29 MED ORDER — LIDOCAINE-PRILOCAINE 2.5-2.5 % EX CREA: TOPICAL_CREAM | Freq: Once | CUTANEOUS | Status: DC

## 2014-11-29 MED ORDER — IOHEXOL 300 MG/ML  SOLN
7.0000 mL | Freq: Once | INTRAMUSCULAR | Status: AC | PRN
  Administered 2014-11-29: 7 mL via INTRAVENOUS

## 2014-11-29 MED ORDER — HEPARIN SOD (PORK) LOCK FLUSH 100 UNIT/ML IV SOLN
INTRAVENOUS | Status: DC
Start: 2014-11-29 — End: 2014-11-30
  Filled 2014-11-29: qty 5

## 2014-11-29 NOTE — Progress Notes (Signed)
1515 there was noted a "bump" under skin at top of port that was concerning, when accessed able to get brisk blood return but RNs wanted to be positive of no leak in the port system. Requested dye study. Pt sent to IR with huber needle in situ. Pt refused peripheral iv to receive herceptin. Pt rescheduled for 12/24 at 0845 pending dye study results. Pt is in agreement with plan.

## 2014-11-29 NOTE — Procedures (Signed)
Left port injection:  Slight retraction and kink at the left clavicle soft tissues. Port remains patent with tip lower SVC OK to use No comp

## 2014-11-30 ENCOUNTER — Telehealth: Payer: Self-pay | Admitting: Oncology

## 2014-11-30 ENCOUNTER — Ambulatory Visit: Payer: BC Managed Care – PPO

## 2014-11-30 NOTE — Telephone Encounter (Signed)
pt cld to CX herceptin-adv need to spk w/val to r/[place] [id number]

## 2014-12-04 ENCOUNTER — Ambulatory Visit
Admission: RE | Admit: 2014-12-04 | Discharge: 2014-12-04 | Disposition: A | Discharge status: Home / Self Care | Payer: BC Managed Care – PPO | Source: Ambulatory Visit | Attending: Radiation Oncology | Admitting: Radiation Oncology

## 2014-12-04 ENCOUNTER — Telehealth: Payer: Self-pay | Admitting: *Deleted

## 2014-12-04 DIAGNOSIS — C50411 Malignant neoplasm of upper-outer quadrant of right female breast: Secondary | ICD-10-CM

## 2014-12-04 DIAGNOSIS — Z510 Encounter for antineoplastic radiation therapy: Secondary | ICD-10-CM | POA: Diagnosis not present

## 2014-12-04 MED ORDER — BIAFINE EX EMUL
CUTANEOUS | Status: DC | PRN
  Administered 2014-12-04: 17:00:00 via TOPICAL

## 2014-12-04 NOTE — Telephone Encounter (Signed)
This RN obtained results of dye study of port per issues at chemo appointment on 12/23.  Noted per image of line noted loop under clavicle with report stating " kinking in line ". Per dye study of approximately 15cc of dye - line is noted as "patent".  Above reviewed by RNs including nurse manager regarding continued use with noted concerns.  Per therapy over the past 3 months patient has had repeated line issues including no blood return and then noted area of fullness at port site which increases with flushing. Line is very positional with flow to gravity with interruption or flow noted when patient is moving.  Above all discussed with MD and request for revision appropriate.  Above discussed with pt who declines proceeding with therapy by peripheral IV but is requesting for port to be revised.  This RN will contact MD who placed port.  Pt did state she is willing to have IR revise if Dr Brantley Stage not able to do in a timely manner for continuity of therapy.

## 2014-12-05 ENCOUNTER — Ambulatory Visit
Admission: RE | Admit: 2014-12-05 | Discharge: 2014-12-05 | Disposition: A | Discharge status: Home / Self Care | Payer: BC Managed Care – PPO | Source: Ambulatory Visit | Attending: Radiation Oncology | Admitting: Radiation Oncology

## 2014-12-05 VITALS — BP 138/65 | HR 83 | Temp 98.8°F | Wt 206.0 lb

## 2014-12-05 DIAGNOSIS — Z510 Encounter for antineoplastic radiation therapy: Secondary | ICD-10-CM | POA: Diagnosis not present

## 2014-12-05 DIAGNOSIS — C50411 Malignant neoplasm of upper-outer quadrant of right female breast: Secondary | ICD-10-CM

## 2014-12-05 NOTE — Progress Notes (Signed)
Weekly Management Note Current Dose: 56.4  Gy  Projected Dose: 60.4 Gy   Narrative:  The patient presents for routine under treatment assessment.  CBCT/MVCT images/Port film x-rays were reviewed.  The chart was checked. Underarm is healing. Some peeling in lateral inframammary fold. Must have PAC revision.   Physical Findings: Weight: 206 lb (93.441 kg). More dry desquamation now. Inframammary fold laterally is peeling a bit.   Impression:  The patient is tolerating radiation.  Plan:  Continue treatment as planned. Continue neosporin as directed and biafene. Follow up in 1 month.

## 2014-12-06 ENCOUNTER — Ambulatory Visit
Admission: RE | Admit: 2014-12-06 | Discharge: 2014-12-06 | Disposition: A | Discharge status: Home / Self Care | Payer: BC Managed Care – PPO | Source: Ambulatory Visit | Attending: Radiation Oncology | Admitting: Radiation Oncology

## 2014-12-06 ENCOUNTER — Ambulatory Visit: Payer: BC Managed Care – PPO

## 2014-12-06 DIAGNOSIS — Z510 Encounter for antineoplastic radiation therapy: Secondary | ICD-10-CM | POA: Diagnosis not present

## 2014-12-07 ENCOUNTER — Other Ambulatory Visit (INDEPENDENT_AMBULATORY_CARE_PROVIDER_SITE_OTHER): Payer: Self-pay | Admitting: Surgery

## 2014-12-07 ENCOUNTER — Ambulatory Visit
Admission: RE | Admit: 2014-12-07 | Discharge: 2014-12-07 | Disposition: A | Discharge status: Home / Self Care | Payer: BC Managed Care – PPO | Source: Ambulatory Visit | Attending: Radiation Oncology | Admitting: Radiation Oncology

## 2014-12-07 ENCOUNTER — Encounter: Payer: Self-pay | Admitting: Radiation Oncology

## 2014-12-07 DIAGNOSIS — Z510 Encounter for antineoplastic radiation therapy: Secondary | ICD-10-CM | POA: Diagnosis not present

## 2014-12-07 DIAGNOSIS — C50911 Malignant neoplasm of unspecified site of right female breast: Secondary | ICD-10-CM

## 2014-12-07 NOTE — Telephone Encounter (Signed)
OK TO HAVE IR REVISE SINCE THAT MAY BE QUICKER AND HAVE LESS DISRUPTION THX

## 2014-12-08 NOTE — Progress Notes (Signed)
  Radiation Oncology         [phone] ________________________________  Name: MORRISON MASSER MRN: [id number]  Date: 12/07/2014  DOB: 08-Mar-1951  End of Treatment Note  Diagnosis:   T2N1 Invasive Ductal Carcinoma  Indication for treatment:  Curative     Radiation treatment dates:   10/18/2014-12/07/2014  Site/dose:  Site/dose:    Right breast (high tangents)/ 50.4 Gray @ 1.8 Pearline Cables per fraction x 28 fractions Right breast boost / 10 Gray at [employer] per fraction x 5 fractions  Beams/energy:  Opposed Tangents / 6, 10 and 15 MV photons Three field / 15 and 6 MV photons  Narrative: The patient tolerated radiation treatment relatively well.   She had dry desquamation over the breast with some moist desquamation in the inframammary fold and axilla.  Plan: The patient has completed radiation treatment. The patient will return to radiation oncology clinic for routine followup in one month. I advised them to call or return sooner if they have any questions or concerns related to their recovery or treatment.  ------------------------------------------------  Thea Silversmith, MD

## 2014-12-12 ENCOUNTER — Other Ambulatory Visit: Payer: Self-pay | Admitting: *Deleted

## 2014-12-12 ENCOUNTER — Telehealth: Payer: Self-pay | Admitting: *Deleted

## 2014-12-12 ENCOUNTER — Other Ambulatory Visit: Payer: Self-pay | Admitting: Radiology

## 2014-12-12 DIAGNOSIS — T85698A Other mechanical complication of other specified internal prosthetic devices, implants and grafts, initial encounter: Secondary | ICD-10-CM

## 2014-12-12 DIAGNOSIS — T85618A Breakdown (mechanical) of other specified internal prosthetic devices, implants and grafts, initial encounter: Secondary | ICD-10-CM

## 2014-12-12 DIAGNOSIS — Z95828 Presence of other vascular implants and grafts: Secondary | ICD-10-CM

## 2014-12-12 DIAGNOSIS — C50411 Malignant neoplasm of upper-outer quadrant of right female breast: Secondary | ICD-10-CM

## 2014-12-14 ENCOUNTER — Other Ambulatory Visit: Payer: Self-pay | Admitting: Radiology

## 2014-12-15 ENCOUNTER — Ambulatory Visit (HOSPITAL_COMMUNITY)
Admission: RE | Admit: 2014-12-15 | Discharge: 2014-12-15 | Disposition: A | Discharge status: Home / Self Care | Payer: BLUE CROSS/BLUE SHIELD | Source: Ambulatory Visit | Attending: Surgery | Admitting: Surgery

## 2014-12-15 ENCOUNTER — Other Ambulatory Visit: Payer: Self-pay | Admitting: Oncology

## 2014-12-15 ENCOUNTER — Ambulatory Visit (HOSPITAL_COMMUNITY)
Admission: RE | Admit: 2014-12-15 | Discharge: 2014-12-15 | Disposition: A | Discharge status: Home / Self Care | Payer: BLUE CROSS/BLUE SHIELD | Source: Ambulatory Visit | Attending: Oncology | Admitting: Oncology

## 2014-12-15 ENCOUNTER — Encounter (HOSPITAL_COMMUNITY): Payer: Self-pay

## 2014-12-15 DIAGNOSIS — Y838 Other surgical procedures as the cause of abnormal reaction of the patient, or of later complication, without mention of misadventure at the time of the procedure: Secondary | ICD-10-CM | POA: Diagnosis not present

## 2014-12-15 DIAGNOSIS — T85618A Breakdown (mechanical) of other specified internal prosthetic devices, implants and grafts, initial encounter: Secondary | ICD-10-CM

## 2014-12-15 DIAGNOSIS — Z853 Personal history of malignant neoplasm of breast: Secondary | ICD-10-CM | POA: Diagnosis not present

## 2014-12-15 DIAGNOSIS — T85698A Other mechanical complication of other specified internal prosthetic devices, implants and grafts, initial encounter: Secondary | ICD-10-CM

## 2014-12-15 DIAGNOSIS — T82599A Other mechanical complication of unspecified cardiac and vascular devices and implants, initial encounter: Secondary | ICD-10-CM | POA: Diagnosis present

## 2014-12-15 DIAGNOSIS — T82828A Fibrosis of vascular prosthetic devices, implants and grafts, initial encounter: Secondary | ICD-10-CM | POA: Diagnosis not present

## 2014-12-15 DIAGNOSIS — Z95828 Presence of other vascular implants and grafts: Secondary | ICD-10-CM

## 2014-12-15 DIAGNOSIS — Z87891 Personal history of nicotine dependence: Secondary | ICD-10-CM | POA: Diagnosis not present

## 2014-12-15 DIAGNOSIS — Z9221 Personal history of antineoplastic chemotherapy: Secondary | ICD-10-CM | POA: Diagnosis not present

## 2014-12-15 DIAGNOSIS — E119 Type 2 diabetes mellitus without complications: Secondary | ICD-10-CM | POA: Insufficient documentation

## 2014-12-15 DIAGNOSIS — C50411 Malignant neoplasm of upper-outer quadrant of right female breast: Secondary | ICD-10-CM

## 2014-12-15 LAB — CBC WITH DIFFERENTIAL/PLATELET
BASOS ABS: 0 10*3/uL (ref 0.0–0.1)
BASOS PCT: 0 % (ref 0–1)
EOS ABS: 0 10*3/uL (ref 0.0–0.7)
Eosinophils Relative: 0 % (ref 0–5)
HCT: 39.1 % (ref 36.0–46.0)
HEMOGLOBIN: 12.5 g/dL (ref 12.0–15.0)
LYMPHS ABS: 0.9 10*3/uL (ref 0.7–4.0)
LYMPHS PCT: 25 % (ref 12–46)
MCH: 27.5 pg (ref 26.0–34.0)
MCHC: 32 g/dL (ref 30.0–36.0)
MCV: 86.1 fL (ref 78.0–100.0)
Monocytes Absolute: 0.2 10*3/uL (ref 0.1–1.0)
Monocytes Relative: 6 % (ref 3–12)
NEUTROS ABS: 2.5 10*3/uL (ref 1.7–7.7)
Neutrophils Relative %: 69 % (ref 43–77)
Platelets: 231 10*3/uL (ref 150–400)
RBC: 4.54 MIL/uL (ref 3.87–5.11)
RDW: 12.9 % (ref 11.5–15.5)
WBC: 3.6 10*3/uL — ABNORMAL LOW (ref 4.0–10.5)

## 2014-12-15 LAB — APTT: aPTT: 34 seconds (ref 24–37)

## 2014-12-15 LAB — PROTIME-INR
INR: 0.95 (ref 0.00–1.49)
Prothrombin Time: 12.8 seconds (ref 11.6–15.2)

## 2014-12-15 LAB — GLUCOSE, CAPILLARY: Glucose-Capillary: 124 mg/dL — ABNORMAL HIGH (ref 70–99)

## 2014-12-15 MED ORDER — FENTANYL CITRATE 0.05 MG/ML IJ SOLN
INTRAMUSCULAR | Status: AC
  Filled 2014-12-15: qty 4

## 2014-12-15 MED ORDER — MIDAZOLAM HCL 2 MG/2ML IJ SOLN
INTRAMUSCULAR | Status: AC
  Filled 2014-12-15: qty 6

## 2014-12-15 MED ORDER — CEFAZOLIN SODIUM-DEXTROSE 2-3 GM-% IV SOLR
2.0000 g | INTRAVENOUS | Status: AC
Start: 2014-12-15 — End: 2014-12-15
  Administered 2014-12-15: 2 g via INTRAVENOUS

## 2014-12-15 MED ORDER — SODIUM CHLORIDE 0.9 % IV SOLN
INTRAVENOUS | Status: DC
  Administered 2014-12-15: 08:00:00 via INTRAVENOUS

## 2014-12-15 MED ORDER — LIDOCAINE HCL 1 % IJ SOLN
INTRAMUSCULAR | Status: AC
  Filled 2014-12-15: qty 20

## 2014-12-15 MED ORDER — MIDAZOLAM HCL 2 MG/2ML IJ SOLN
INTRAMUSCULAR | Status: AC | PRN
  Administered 2014-12-15 (×6): 0.5 mg via INTRAVENOUS
  Administered 2014-12-15: 1 mg via INTRAVENOUS

## 2014-12-15 MED ORDER — FENTANYL CITRATE 0.05 MG/ML IJ SOLN
INTRAMUSCULAR | Status: AC | PRN
  Administered 2014-12-15: 50 ug via INTRAVENOUS
  Administered 2014-12-15 (×3): 25 ug via INTRAVENOUS

## 2014-12-15 MED ORDER — HEPARIN SOD (PORK) LOCK FLUSH 100 UNIT/ML IV SOLN
INTRAVENOUS | Status: AC
  Filled 2014-12-15: qty 5

## 2014-12-15 MED ORDER — HEPARIN SOD (PORK) LOCK FLUSH 100 UNIT/ML IV SOLN
INTRAVENOUS | Status: AC | PRN
  Administered 2014-12-15: 500 [IU]

## 2014-12-15 MED ORDER — CEFAZOLIN SODIUM-DEXTROSE 2-3 GM-% IV SOLR
INTRAVENOUS | Status: AC
  Administered 2014-12-15: 2 g via INTRAVENOUS
  Filled 2014-12-15: qty 50

## 2014-12-15 NOTE — Discharge Instructions (Addendum)
Conscious Sedation °Sedation is the use of medicines to promote relaxation and relieve discomfort and anxiety. Conscious sedation is a type of sedation. Under conscious sedation you are less alert than normal but are still able to respond to instructions or stimulation. Conscious sedation is used during short medical and dental procedures. It is milder than deep sedation or general anesthesia and allows you to return to your regular activities sooner.  °LET YOUR HEALTH CARE PROVIDER KNOW ABOUT:  °· Any allergies you have. °· All medicines you are taking, including vitamins, herbs, eye drops, creams, and over-the-counter medicines. °· Use of steroids (by mouth or creams). °· Previous problems you or members of your family have had with the use of anesthetics. °· Any blood disorders you have. °· Previous surgeries you have had. °· Medical conditions you have. °· Possibility of pregnancy, if this applies. °· Use of cigarettes, alcohol, or illegal drugs. °RISKS AND COMPLICATIONS °Generally, this is a safe procedure. However, as with any procedure, problems can occur. Possible problems include: °· Oversedation. °· Trouble breathing on your own. You may need to have a breathing tube until you are awake and breathing on your own. °· Allergic reaction to any of the medicines used for the procedure. °BEFORE THE PROCEDURE °· You may have blood tests done. These tests can help show how well your kidneys and liver are working. They can also show how well your blood clots. °· A physical exam will be done.   °· Only take medicines as directed by your health care provider. You may need to stop taking medicines (such as blood thinners, aspirin, or nonsteroidal anti-inflammatory drugs) before the procedure.   °· Do not eat or drink at least 6 hours before the procedure or as directed by your health care provider. °· Arrange for a responsible adult, family member, or friend to take you home after the procedure. He or she should stay  with you for at least 24 hours after the procedure, until the medicine has worn off. °PROCEDURE  °· An intravenous (IV) catheter will be inserted into one of your veins. Medicine will be able to flow directly into your body through this catheter. You may be given medicine through this tube to help prevent pain and help you relax. °· The medical or dental procedure will be done. °AFTER THE PROCEDURE °· You will stay in a recovery area until the medicine has worn off. Your blood pressure and pulse will be checked.   °·  Depending on the procedure you had, you may be allowed to go home when you can tolerate liquids and your pain is under control. °Document Released: 08/19/2001 Document Revised: 11/29/2013 Document Reviewed: 08/01/2013 °ExitCare® Patient Information ©2015 [employer]. This information is not intended to replace advice given to you by your health care provider. Make sure you discuss any questions you have with your health care provider. °Implanted Port Insertion, Care After °Refer to this sheet in the next few weeks. These instructions provide you with information on caring for yourself after your procedure. Your health care provider may also give you more specific instructions. Your treatment has been planned according to current medical practices, but problems sometimes occur. Call your health care provider if you have any problems or questions after your procedure. °WHAT TO EXPECT AFTER THE PROCEDURE °After your procedure, it is typical to have the following:  °· Discomfort at the port insertion site. Ice packs to the area will help. °· Bruising on the skin over the port. This will subside   in 3-4 days. °HOME CARE INSTRUCTIONS °· After your port is placed, you will get a manufacturer's information card. The card has information about your port. Keep this card with you at all times.   °· Know what kind of port you have. There are many types of ports available.   °· Wear a medical alert bracelet in  case of an emergency. This can help alert health care workers that you have a port.   °· The port can stay in for as long as your health care provider believes it is necessary.   °· A home health care nurse may give medicines and take care of the port.   °· You or a family member can get special training and directions for giving medicine and taking care of the port at home.   °SEEK MEDICAL CARE IF:  °· Your port does not flush or you are unable to get a blood return.   °· You have a fever or chills. °SEEK IMMEDIATE MEDICAL CARE IF: °· You have new fluid or pus coming from your incision.   °· You notice a bad smell coming from your incision site.   °· You have swelling, pain, or more redness at the incision or port site.   °· You have chest pain or shortness of breath. °Document Released: 09/14/2013 Document Revised: 11/29/2013 Document Reviewed: 09/14/2013 °Exit[employer]® Patient Information ©2015 [employer]. This information is not intended to replace advice given to you by your health care provider. Make sure you discuss any questions you have with your health care provider. °Implanted Port Home Guide °An implanted port is a type of central line that is placed under the skin. Central lines are used to provide IV access when treatment or nutrition needs to be given through a person's veins. Implanted ports are used for long-term IV access. An implanted port may be placed because:  °· You need IV medicine that would be irritating to the small veins in your hands or arms.   °· You need long-term IV medicines, such as antibiotics.   °· You need IV nutrition for a long period.   °· You need frequent blood draws for lab tests.   °· You need dialysis.   °Implanted ports are usually placed in the chest area, but they can also be placed in the upper arm, the abdomen, or the leg. An implanted port has two main parts:  °· Reservoir. The reservoir is round and will appear as a small, raised area under your skin. The reservoir  is the part where a needle is inserted to give medicines or draw blood.   °· Catheter. The catheter is a thin, flexible tube that extends from the reservoir. The catheter is placed into a large vein. Medicine that is inserted into the reservoir goes into the catheter and then into the vein.   °HOW WILL I CARE FOR MY INCISION SITE? °Do not get the incision site wet. Bathe or shower as directed by your health care provider.  °HOW IS MY PORT ACCESSED? °Special steps must be taken to access the port:  °· Before the port is accessed, a numbing cream can be placed on the skin. This helps numb the skin over the port site.   °· Your health care provider uses a sterile technique to access the port. °· Your health care provider must put on a mask and sterile gloves. °· The skin over your port is cleaned carefully with an antiseptic and allowed to dry. °· The port is gently pinched between sterile gloves, and a needle is inserted into the port. °·   Only "non-coring" port needles should be used to access the port. Once the port is accessed, a blood return should be checked. This helps ensure that the port is in the vein and is not clogged.   If your port needs to remain accessed for a constant infusion, a clear (transparent) bandage will be placed over the needle site. The bandage and needle will need to be changed every week, or as directed by your health care provider.   Keep the bandage covering the needle clean and dry. Do not get it wet. Follow your health care provider's instructions on how to take a shower or bath while the port is accessed.   If your port does not need to stay accessed, no bandage is needed over the port.  WHAT IS FLUSHING? Flushing helps keep the port from getting clogged. Follow your health care provider's instructions on how and when to flush the port. Ports are usually flushed with saline solution or a medicine called heparin. The need for flushing will depend on how the port is used.    If the port is used for intermittent medicines or blood draws, the port will need to be flushed:   After medicines have been given.   After blood has been drawn.   As part of routine maintenance.   If a constant infusion is running, the port may not need to be flushed.  HOW LONG WILL MY PORT STAY IMPLANTED? The port can stay in for as long as your health care provider thinks it is needed. When it is time for the port to come out, surgery will be done to remove it. The procedure is similar to the one performed when the port was put in.  WHEN SHOULD I SEEK IMMEDIATE MEDICAL CARE? When you have an implanted port, you should seek immediate medical care if:   You notice a bad smell coming from the incision site.   You have swelling, redness, or drainage at the incision site.   You have more swelling or pain at the port site or the surrounding area.   You have a fever that is not controlled with medicine. Document Released: 11/24/2005 Document Revised: 09/14/2013 Document Reviewed: 08/01/2013 [facility] Patient Information 2015 [place], [place]. This information is not intended to replace advice given to you by your health care provider. Make sure you discuss any questions you have with your health care provider. Conscious Sedation, Adult, Care After Refer to this sheet in the next few weeks. These instructions provide you with information on caring for yourself after your procedure. Your health care provider may also give you more specific instructions. Your treatment has been planned according to current medical practices, but problems sometimes occur. Call your health care provider if you have any problems or questions after your procedure. WHAT TO EXPECT AFTER THE PROCEDURE  After your procedure:  You may feel sleepy, clumsy, and have poor balance for several hours.  Vomiting may occur if you eat too soon after the procedure. HOME CARE INSTRUCTIONS  Do not participate in any  activities where you could become injured for at least 24 hours. Do not:  Drive.  Swim.  Ride a bicycle.  Operate heavy machinery.  Cook.  Use power tools.  Climb ladders.  Work from a high place.  Do not make important decisions or sign legal documents until you are improved.  If you vomit, drink water, juice, or soup when you can drink without vomiting. Make sure you have little or  no nausea before eating solid foods.  Only take over-the-counter or prescription medicines for pain, discomfort, or fever as directed by your health care provider.  Make sure you and your family fully understand everything about the medicines given to you, including what side effects may occur.  You should not drink alcohol, take sleeping pills, or take medicines that cause drowsiness for at least 24 hours.  If you smoke, do not smoke without supervision.  If you are feeling better, you may resume normal activities 24 hours after you were sedated.  Keep all appointments with your health care provider. SEEK MEDICAL CARE IF:  Your skin is pale or bluish in color.  You continue to feel nauseous or vomit.  Your pain is getting worse and is not helped by medicine.  You have bleeding or swelling.  You are still sleepy or feeling clumsy after 24 hours. SEEK IMMEDIATE MEDICAL CARE IF:  You develop a rash.  You have difficulty breathing.  You develop any type of allergic problem.  You have a fever. MAKE SURE YOU:  Understand these instructions.  Will watch your condition.  Will get help right away if you are not doing well or get worse. Document Released: 09/14/2013 Document Reviewed: 09/14/2013 [facility] Patient Information 2015 [place], [place]. This information is not intended to replace advice given to you by your health care provider. Make sure you discuss any questions you have with your health care provider.

## 2014-12-15 NOTE — H&P (Signed)
Chief Complaint: "I'm here to get my port worked on"  Referring Physician(s): Magrinat,Gustav C  History of Present Illness: Brandy Jackson is a 64 y.o. female with history of right breast carcinoma, s/p chemoradiation, who has experienced difficulty with function of her surgically placed left subclavian port a cath (inserted 04/11/2014). Recent port injection reveals slight retraction/redundancy/ kink in catheter at mid clavicular region. She presents today for port revision as well as  possible removal and placement of new port a cath.  Past Medical History  Diagnosis Date  . Diabetes mellitus without complication   . Arthritis   . Hot flashes   . Cancer   . Breast cancer   . Wears glasses   . Wears partial dentures     bottom    Past Surgical History  Procedure Laterality Date  . Bladder repair w/ cesarean section    . Colonoscopy    . Portacath placement N/A 04/11/2014    Procedure: INSERTION PORT-A-CATH;  Surgeon: Joyice Faster. Cornett, MD;  Location: [place];  Service: General;  Laterality: N/A;  . Re-excision of breast lumpectomy Right 06/06/2014    Procedure: RE-EXCISION OF BREAST LUMPECTOMY;  Surgeon: Marcello Moores A. Cornett, MD;  Location: [place];  Service: General;  Laterality: Right;    Allergies: Sulfa antibiotics  Medications: Prior to Admission medications   Medication Sig Start Date End Date Taking? Authorizing Provider  calcipotriene (DOVONOX) 0.005 % ointment Apply 1 application topically 2 (two) times daily.  10/26/14  Yes Historical Provider, MD  clotrimazole-betamethasone (LOTRISONE) lotion Apply 1 application topically at bedtime as needed (itching).  11/01/14  Yes Historical Provider, MD  emollient (BIAFINE) cream Apply 90 g topically as needed (radiation burn).    Yes Historical Provider, MD  metFORMIN (GLUCOPHAGE) 500 MG tablet Take 500 mg by mouth 2 (two) times daily with a meal. 09/15/14  Yes Marcelino Duster, NP    TACLONEX external suspension Apply 1 application topically daily. To scalp for sorosis 10/27/14  Yes Historical Provider, MD  lidocaine-prilocaine (EMLA) cream Apply 1 application topically as needed. Patient taking differently: Apply 1 application topically as needed (chemo).  11/29/14   Marcelino Duster, NP  trastuzumab (HERCEPTIN) 440 MG injection Inject 440 mg into the vein every 21 ( twenty-one) days.    Historical Provider, MD  Wound Dressings (RADIAPLEXRX EX) Apply 170 g topically.    Historical Provider, MD    History reviewed. No pertinent family history.  History   Social History  . Marital Status: Married    Spouse Name: N/A    Number of Children: 3  . Years of Education: N/A   Occupational History  .      Teacher    Social History Main Topics  . Smoking status: Former Smoker -- 1.00 packs/day    Quit date: 03/08/1986  . Smokeless tobacco: Never Used  . Alcohol Use: No  . Drug Use: No  . Sexual Activity: Yes   Other Topics Concern  . None   Social History Narrative      Review of Systems     Constitutional: Negative for fever and chills.  Respiratory: Negative for cough and shortness of breath.   Cardiovascular:       Some occ soreness rt breast from radiation  Gastrointestinal: Negative for nausea, vomiting, abdominal pain and blood in stool.  Genitourinary: Negative for dysuria and hematuria.  Musculoskeletal: Negative for back pain.  Neurological: Negative for headaches.  Hematological: Does  not bruise/bleed easily.    Vital Signs: BP 140/78 mmHg  Pulse 74  Temp(Src) 98.7 F (37.1 C) (Oral)  Resp 16  SpO2 100%  Physical Exam  Constitutional: She is oriented to person, place, and time. She appears well-developed and well-nourished.  Cardiovascular: Normal rate and regular rhythm.   Pulmonary/Chest: Effort normal and breath sounds normal.  Clean, intact left chest wall PAC, NT  Abdominal: Soft. Bowel sounds are normal. There is no  tenderness.  Musculoskeletal: Normal range of motion. She exhibits edema.  Neurological: She is alert and oriented to person, place, and time.    Imaging: Ir Cv Line Injection  11/29/2014   CLINICAL DATA:  Breast cancer, receiving chemotherapy, port catheter malfunction  EXAM: CENTRAL VENOUS CATHETER  COMPARISON:  08/18/2014  FINDINGS: Under sterile conditions, the existing left chest port catheter was injected with contrast. Fluoroscopic imaging performed. Similar kink of the port catheter tubing in the mid clavicle soft tissue region. Port catheter remains patent with the tip in the lower SVC. Lumen remains patent. No significant thrombus or fiber sheath.  IMPRESSION: Stable appearance of the port catheter with a kink in the mid clavicle soft tissues. Tubing remains patent. Tip in the lower SVC. Okay to use.   Electronically Signed   By: Daryll Brod M.D.   On: 11/29/2014 16:25    Labs:  CBC:  Recent Labs  10/26/14 1346 11/10/14 1334 11/29/14 1335 12/15/14 0750  WBC 5.4 4.5 4.4 3.6*  HGB 11.2* 11.6 12.4 12.5  HCT 34.7* 36.7 37.9 39.1  PLT 274 193 205 231    COAGS: No results for input(s): INR, APTT in the last 8760 hours.  BMP:  Recent Labs  04/07/14 1320  10/20/14 1552 10/26/14 1346 11/10/14 1334 11/29/14 1335  NA 142  < > 142 140 143 143  K 4.2  < > 3.6 3.7 3.9 3.8  CL 103  --   --   --   --   --   CO2 25  < > 23 23 21* 25  GLUCOSE 126*  < > 117 161* 137 134  BUN 16  < > 16.4 15.9 16.0 12.1  CALCIUM 9.4  < > 9.5 9.4 9.5 9.6  CREATININE 0.56  < > 0.8 0.8 0.8 0.8  GFRNONAA >90  --   --   --   --   --   GFRAA >90  --   --   --   --   --   < > = values in this interval not displayed.  LIVER FUNCTION TESTS:  Recent Labs  10/20/14 1552 10/26/14 1346 11/10/14 1334 11/29/14 1335  BILITOT 0.29 0.29 <0.20 <0.20  AST 20 21 18 20   ALT 14 17 19 17   ALKPHOS 61 66 66 60  PROT 6.7 6.9 7.0 7.1  ALBUMIN 3.8 3.9 3.8 3.8    TUMOR MARKERS: No results for input(s):  AFPTM, CEA, CA199, CHROMGRNA in the last 8760 hours.  Assessment and Plan: BRENNAH QURAISHI is a 64 y.o. female with history of right breast carcinoma, s/p chemoradiation, who has experienced difficulty with function of her surgically placed left subclavian port a cath (inserted 04/11/2014). Recent port injection reveals slight retraction/redundancy/kink in catheter at mid clavicular region. She presents today for port revision as well as  possible removal and placement of new port a cath. Details/risks of procedure d/w pt/husband with their understanding and consent.     Signed: Autumn Messing 12/15/2014, 8:23 AM

## 2014-12-15 NOTE — Procedures (Signed)
Procedure:  Placement of right IJ portacath; removal of left chest portacath Findings:  New SL Power port via right IJ vein.  Tip at cavoatrial junction. OK to use new port.  Left chest port removed in entirety.

## 2014-12-21 ENCOUNTER — Ambulatory Visit
Admission: RE | Admit: 2014-12-21 | Discharge: 2014-12-21 | Disposition: A | Discharge status: Home / Self Care | Payer: BLUE CROSS/BLUE SHIELD | Source: Ambulatory Visit | Attending: Oncology | Admitting: Oncology

## 2014-12-21 DIAGNOSIS — D630 Anemia in neoplastic disease: Secondary | ICD-10-CM

## 2014-12-21 DIAGNOSIS — C50411 Malignant neoplasm of upper-outer quadrant of right female breast: Secondary | ICD-10-CM

## 2014-12-21 DIAGNOSIS — R600 Localized edema: Secondary | ICD-10-CM

## 2014-12-22 ENCOUNTER — Other Ambulatory Visit: Payer: Self-pay | Admitting: Nurse Practitioner

## 2014-12-22 ENCOUNTER — Other Ambulatory Visit: Payer: BC Managed Care – PPO

## 2014-12-22 ENCOUNTER — Other Ambulatory Visit: Payer: BLUE CROSS/BLUE SHIELD

## 2014-12-22 ENCOUNTER — Ambulatory Visit: Payer: BLUE CROSS/BLUE SHIELD

## 2014-12-22 NOTE — Progress Notes (Signed)
Per Susanne Borders, NP: Pt declined treatment today. Will be rescheduled.

## 2014-12-25 ENCOUNTER — Telehealth: Payer: Self-pay | Admitting: *Deleted

## 2014-12-25 NOTE — Telephone Encounter (Signed)
Per staff message and POF I have scheduled appts. Advised scheduler of appts. JMW  

## 2014-12-26 ENCOUNTER — Telehealth: Payer: Self-pay | Admitting: Oncology

## 2014-12-26 ENCOUNTER — Telehealth: Payer: Self-pay | Admitting: *Deleted

## 2014-12-26 NOTE — Telephone Encounter (Signed)
perpof to sch pt appt-cld pt & left message of sch appts

## 2014-12-27 ENCOUNTER — Encounter: Payer: Self-pay | Admitting: *Deleted

## 2014-12-27 ENCOUNTER — Telehealth: Payer: Self-pay | Admitting: *Deleted

## 2014-12-27 NOTE — Telephone Encounter (Signed)
Returned call to pt. No answer, but left a detailed message to her VM concerning her request to start back  herceptin in Feb. due to the healing of her port. Pt recently had a port revision and the area is sore. Pt seems to think she had a reaction from the tape that was used. I left a message to inform pt that her herceptin tx's will resume in Feb per NP. I told pt to call this nurse back to let me know she got the message [phone]. Message to be forwarded to [facility].

## 2014-12-27 NOTE — Telephone Encounter (Signed)
Pt called in to [facility] stating concern due to ongoing healing at port revision due to " reaction to the tape that was used "  " I am scheduled for treatment this Friday but feel that the site needs more time to heal "  " I was hoping to wait and start back on the herceptin in Feb.  Lareta states " Nira Conn saw this area last week and knows what it looks like "  Per inquiry area is not getting worse " just still very sore and blistered "  Return call number given for pt as [phone].  THIS NOTE WILL BE SENT TO NP AND RN AT DESK FOR FOLLOW UP AND RETURN CALL TO PT.

## 2014-12-29 ENCOUNTER — Other Ambulatory Visit: Payer: BLUE CROSS/BLUE SHIELD

## 2014-12-29 ENCOUNTER — Ambulatory Visit: Payer: BLUE CROSS/BLUE SHIELD

## 2014-12-29 NOTE — Progress Notes (Signed)
  Radiation Oncology         [phone] ________________________________  Name: CRISTA NUON MRN: [id number]  Date: 11/29/14  DOB: [birth year]  Simulation Verification Note  NARRATIVE: The patient was brought to the treatment unit and placed in the planned treatment position. The clinical setup was verified. Then port films were obtained and uploaded to the radiation oncology medical record software.  The treatment beams were carefully compared against the planned radiation fields. The position location and shape of the radiation fields was reviewed. The targeted volume of tissue appears appropriately covered by the radiation beams. Organs at risk appear to be excluded as planned.  Based on my personal review, I approved the simulation verification. The patient's treatment will proceed as planned.  ------------------------------------------------  Thea Silversmith, MD

## 2015-01-04 ENCOUNTER — Ambulatory Visit
Admission: RE | Admit: 2015-01-04 | Discharge: 2015-01-04 | Disposition: A | Discharge status: Home / Self Care | Payer: BC Managed Care – PPO | Source: Ambulatory Visit | Attending: Radiation Oncology | Admitting: Radiation Oncology

## 2015-01-04 VITALS — BP 144/118 | HR 89 | Temp 98.8°F | Resp 12 | Wt 204.7 lb

## 2015-01-04 DIAGNOSIS — C50411 Malignant neoplasm of upper-outer quadrant of right female breast: Secondary | ICD-10-CM

## 2015-01-04 NOTE — Progress Notes (Signed)
She is currently in no pain.  Pt right breast- Hyperpigmentation and dry desquamation.  Pt denies edema, breast tenderness, or areas to skin. Pt continues to apply Biafine bid, she reports she is going to switch back to radiaplex when she has completed the Biafine. BP 144/118 mmHg  Pulse 89  Temp(Src) 98.8 F (37.1 C) (Oral)  Resp 12  Wt 204 lb 11.2 oz (92.851 kg)  SpO2 100%

## 2015-01-05 ENCOUNTER — Telehealth: Payer: Self-pay | Admitting: *Deleted

## 2015-01-05 NOTE — Telephone Encounter (Signed)
Left message for a return phone call to discuss her port issues and to add an appointment with Dr. Jana Hakim for 01/19/15 at 1pm with labs at 1230 and chemo at 2pm.  Awaiting patient response.

## 2015-01-05 NOTE — Progress Notes (Signed)
   Department of Radiation Oncology  Phone:  [phone] Fax:        [phone]   Name: Brandy Jackson MRN: [id number]  DOB: 12/23/1950  Date: 01/04/2015  Follow Up Visit Note  Diagnosis: Breast cancer of upper-outer quadrant of right female breast   Staging form: Breast, AJCC 7th Edition     Clinical: Stage IIA (T2, N0, cM0) - Unsigned       Staging comments: Staged at breast conference 03/08/14.  Summary and Interval since last radiation: 1 month from 60.4 to the right breast completed 12/07/14  Interval History: Brandy Jackson presents today for routine followup.  She is doing well. She has mostly complaints realted to her PAC site.  She is continuing Herceptin. She has hyperpigmentation of her right breast but is overall pleaed with her cosmetic result. She is still using biafene.   Physical Exam:  Filed Vitals:   01/04/15 1502  BP: 144/118  Pulse: 89  Temp: 98.8 F (37.1 C)  TempSrc: Oral  Resp: 12  Weight: 204 lb 11.2 oz (92.851 kg)  SpO2: 100%   Hyperpigmentaiotn of the breast (worse in axilla) with some hypopigmentation of the inframammary fold.   IMPRESSION: Brandy Jackson is a 64 y.o. female s/p breast conservation  PLAN:  She is doing well. We discussed the need for follow up every 4-6 months which she has scheduled.  She should switch to a lotion with Vitamin E for the hyperpigmentation. We discussed the need for yearly mammograms which she can schedule with her OBGYN or with medical oncology. We discussed the need for sun protection in the treated area.  She can always call me with questions.  I will follow up with her on an as needed basis.   She would like to be assessed by medical oncology prior to her next dose of chemo due to the problems she has had with her portacath.  I do not see a visit scheduled. I will ask our navigators to get this set up for her.      Thea Silversmith, MD

## 2015-01-11 ENCOUNTER — Ambulatory Visit: Payer: BC Managed Care – PPO | Admitting: Oncology

## 2015-01-11 ENCOUNTER — Other Ambulatory Visit: Payer: BC Managed Care – PPO

## 2015-01-12 ENCOUNTER — Other Ambulatory Visit: Payer: BC Managed Care – PPO

## 2015-01-12 ENCOUNTER — Ambulatory Visit: Payer: BC Managed Care – PPO

## 2015-01-19 ENCOUNTER — Telehealth: Payer: Self-pay | Admitting: *Deleted

## 2015-01-19 ENCOUNTER — Other Ambulatory Visit: Payer: Self-pay | Admitting: Nurse Practitioner

## 2015-01-19 ENCOUNTER — Ambulatory Visit (HOSPITAL_BASED_OUTPATIENT_CLINIC_OR_DEPARTMENT_OTHER): Payer: BLUE CROSS/BLUE SHIELD | Admitting: Oncology

## 2015-01-19 ENCOUNTER — Ambulatory Visit (HOSPITAL_BASED_OUTPATIENT_CLINIC_OR_DEPARTMENT_OTHER): Payer: BLUE CROSS/BLUE SHIELD

## 2015-01-19 ENCOUNTER — Other Ambulatory Visit (HOSPITAL_BASED_OUTPATIENT_CLINIC_OR_DEPARTMENT_OTHER): Payer: BLUE CROSS/BLUE SHIELD

## 2015-01-19 ENCOUNTER — Other Ambulatory Visit: Payer: Self-pay | Admitting: *Deleted

## 2015-01-19 ENCOUNTER — Telehealth: Payer: Self-pay | Admitting: Oncology

## 2015-01-19 VITALS — BP 117/62 | HR 88 | Temp 98.4°F | Resp 18 | Ht 67.0 in | Wt 204.8 lb

## 2015-01-19 DIAGNOSIS — G620 Drug-induced polyneuropathy: Secondary | ICD-10-CM

## 2015-01-19 DIAGNOSIS — D630 Anemia in neoplastic disease: Secondary | ICD-10-CM

## 2015-01-19 DIAGNOSIS — T451X5A Adverse effect of antineoplastic and immunosuppressive drugs, initial encounter: Secondary | ICD-10-CM

## 2015-01-19 DIAGNOSIS — Z170 Estrogen receptor positive status [ER+]: Secondary | ICD-10-CM

## 2015-01-19 DIAGNOSIS — Z5112 Encounter for antineoplastic immunotherapy: Secondary | ICD-10-CM

## 2015-01-19 DIAGNOSIS — C50411 Malignant neoplasm of upper-outer quadrant of right female breast: Secondary | ICD-10-CM

## 2015-01-19 DIAGNOSIS — R600 Localized edema: Secondary | ICD-10-CM

## 2015-01-19 LAB — CBC WITH DIFFERENTIAL/PLATELET
BASO%: 0.8 % (ref 0.0–2.0)
Basophils Absolute: 0 10*3/uL (ref 0.0–0.1)
EOS ABS: 0 10*3/uL (ref 0.0–0.5)
EOS%: 0.1 % (ref 0.0–7.0)
HEMATOCRIT: 40.3 % (ref 34.8–46.6)
HGB: 12.5 g/dL (ref 11.6–15.9)
LYMPH#: 1.2 10*3/uL (ref 0.9–3.3)
LYMPH%: 25.2 % (ref 14.0–49.7)
MCH: 26.1 pg (ref 25.1–34.0)
MCHC: 31 g/dL — AB (ref 31.5–36.0)
MCV: 84.1 fL (ref 79.5–101.0)
MONO#: 0.3 10*3/uL (ref 0.1–0.9)
MONO%: 6.9 % (ref 0.0–14.0)
NEUT%: 67 % (ref 38.4–76.8)
NEUTROS ABS: 3.2 10*3/uL (ref 1.5–6.5)
PLATELETS: 207 10*3/uL (ref 145–400)
RBC: 4.79 10*6/uL (ref 3.70–5.45)
RDW: 13.1 % (ref 11.2–14.5)
WBC: 4.8 10*3/uL (ref 3.9–10.3)

## 2015-01-19 LAB — COMPREHENSIVE METABOLIC PANEL (CC13)
ALT: 18 U/L (ref 0–55)
ANION GAP: 13 meq/L — AB (ref 3–11)
AST: 18 U/L (ref 5–34)
Albumin: 3.8 g/dL (ref 3.5–5.0)
Alkaline Phosphatase: 65 U/L (ref 40–150)
BUN: 14.4 mg/dL (ref 7.0–26.0)
CO2: 22 mEq/L (ref 22–29)
Calcium: 9.3 mg/dL (ref 8.4–10.4)
Chloride: 109 mEq/L (ref 98–109)
Creatinine: 0.8 mg/dL (ref 0.6–1.1)
EGFR: >90 mL/min/{1.73_m2} (ref >90)
Glucose: 144 mg/dl — ABNORMAL HIGH (ref 70–140)
POTASSIUM: 3.6 meq/L (ref 3.5–5.1)
SODIUM: 143 meq/L (ref 136–145)
TOTAL PROTEIN: 7 g/dL (ref 6.4–8.3)
Total Bilirubin: <0.2 mg/dL (ref 0.20–1.20)

## 2015-01-19 MED ORDER — HEPARIN SOD (PORK) LOCK FLUSH 100 UNIT/ML IV SOLN
500.0000 [IU] | Freq: Once | INTRAVENOUS | Status: AC | PRN
  Administered 2015-01-19: 500 [IU]
  Filled 2015-01-19: qty 5

## 2015-01-19 MED ORDER — DIPHENHYDRAMINE HCL 25 MG PO CAPS
25.0000 mg | ORAL_CAPSULE | Freq: Once | ORAL | Status: AC
  Administered 2015-01-19: 25 mg via ORAL

## 2015-01-19 MED ORDER — SODIUM CHLORIDE 0.9 % IV SOLN
6.0000 mg/kg | Freq: Once | INTRAVENOUS | Status: AC
  Administered 2015-01-19: 567 mg via INTRAVENOUS
  Filled 2015-01-19: qty 27

## 2015-01-19 MED ORDER — ACETAMINOPHEN 325 MG PO TABS
650.0000 mg | ORAL_TABLET | Freq: Once | ORAL | Status: AC
  Administered 2015-01-19: 650 mg via ORAL

## 2015-01-19 MED ORDER — SODIUM CHLORIDE 0.9 % IV SOLN
Freq: Once | INTRAVENOUS | Status: AC
  Administered 2015-01-19: 14:00:00 via INTRAVENOUS

## 2015-01-19 MED ORDER — ACETAMINOPHEN 325 MG PO TABS
ORAL_TABLET | ORAL | Status: AC
  Filled 2015-01-19: qty 2

## 2015-01-19 MED ORDER — DIPHENHYDRAMINE HCL 25 MG PO CAPS
ORAL_CAPSULE | ORAL | Status: AC
  Filled 2015-01-19: qty 1

## 2015-01-19 MED ORDER — ANASTROZOLE 1 MG PO TABS: 1.0000 mg | ORAL_TABLET | Freq: Every day | ORAL | Status: DC

## 2015-01-19 MED ORDER — SODIUM CHLORIDE 0.9 % IJ SOLN
10.0000 mL | INTRAMUSCULAR | Status: DC | PRN
  Administered 2015-01-19: 10 mL
  Filled 2015-01-19: qty 10

## 2015-01-19 NOTE — Telephone Encounter (Signed)
Per staff message and POF I have scheduled appts. Advised scheduler of appts. JMW  

## 2015-01-19 NOTE — Progress Notes (Signed)
ID: [name] OB: 03/23/1963  MR#: [id number]  CVE#:[id number]  PCP: Ricke Hey, MD GYN:   SU: Dr. Erroll Luna OTHER MD:  CHIEF COMPLAINT:  Right-sided breast cancer  CURRENT THERAPY: Trastuzumab, and anastrozole  BREAST CANCER HISTORY: From the original consult note:  "Brandy Jackson is a 64 y.o. female. Patient palpated a right breast mass. She had a mammogram performed and she was noted to have a mass in the upper outer quadrant measuring 2.1 x 2.0 x 1.7 cm. 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 Tumor was ER positive PR positive HER-2/neu positive (triple positive) with a proliferation marker Ki-67 at 90%. She had MRI of the breasts performed which showed a solitary mass in the upper outer quadrant measuring 2.5 cm. Her case was discussed at the multidisciplinary breast conference."  Her subsequent treatment is as detailed below.   INTERVAL HISTORY:  Brandy Jackson returns today for follow up of her breast cancer. Since her last visit here she completed her radiation treatments. Generally she did well with the os. There was only mild fatigue and no significant desquamation. Her left-sided port became twisted and had to be replaced. Her right-sided port is visible to her in the neck and concerns her. It also "stings" a little. Otherwise it is functioning fine. She is tolerating the trastuzumab without any side effects that she is aware of.  REVIEW OF SYSTEMS:  She continues to work full time. She has about 2 more years to go, she says area her psoriasis is moderately well-controlled. She does have some back and joint pain here in there which is not more intense or persistent than before. Her blood sugars are well-controlled. A detailed review of systems today was otherwise noncontributory  PAST MEDICAL HISTORY: Past Medical History  Diagnosis Date  . Diabetes mellitus without complication   . Arthritis   . Hot flashes   . Cancer   .  Breast cancer   . Wears glasses   . Wears partial dentures     bottom  . Radiation 10/18/14-12/07/14    Invasive ductal carcinoma    PAST SURGICAL HISTORY: Past Surgical History  Procedure Laterality Date  . Bladder repair w/ cesarean section    . Colonoscopy    . Portacath placement N/A 04/11/2014    Procedure: INSERTION PORT-A-CATH;  Surgeon: Joyice Faster. Cornett, MD;  Location: [facility];  Service: General;  Laterality: N/A;  . Re-excision of breast lumpectomy Right 06/06/2014    Procedure: RE-EXCISION OF BREAST LUMPECTOMY;  Surgeon: Marcello Moores A. Cornett, MD;  Location: [facility];  Service: General;  Laterality: Right;    FAMILY HISTORY No family history on file.  GYNECOLOGIC HISTORY: menarche at age 64, G48 P3, went through menopause at age 30.  No h/o of HRT, no h/o abnormal pap smears, or sexually transmitted infections.  Does have frequent vaginal candida infections due to diabetes that she follows with her gynecologist for.    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 Her husband [name] is retired from [employer]. He is a cancer survivor himself. The patient has 3 sons, one of whom is studying to be a pediatrician. All her children are married. She has 6 grandchildren.    ADVANCED DIRECTIVES: not in place.    HEALTH MAINTENANCE: History  Substance Use Topics  . Smoking status: Former Smoker -- 1.00 packs/day    Quit  date: 03/08/1986  . Smokeless tobacco: Never Used  . Alcohol Use: No     Colonoscopy: Bone Density Scan: Obtained 12/21/2014 shows a normal T score Pap Smear:  Vitamin D Level:   Lipid Panel:    Allergies  Allergen Reactions  . Sulfa Antibiotics Rash    Current Outpatient Prescriptions  Medication Sig Dispense Refill  . calcipotriene (DOVONOX) 0.005 % ointment Apply 1 application topically 2 (two) times daily.   0  . clotrimazole-betamethasone  (LOTRISONE) lotion Apply 1 application topically at bedtime as needed (itching).   0  . emollient (BIAFINE) cream Apply 90 g topically as needed (radiation burn).     Brandy Kitchen lidocaine-prilocaine (EMLA) cream Apply 1 application topically as needed. (Patient taking differently: Apply 1 application topically as needed (chemo). ) 30 g 2  . metFORMIN (GLUCOPHAGE) 500 MG tablet Take 500 mg by mouth 2 (two) times daily with a meal.    . TACLONEX external suspension Apply 1 application topically daily. To scalp for sorosis  0  . trastuzumab (HERCEPTIN) 440 MG injection Inject 440 mg into the vein every 21 ( twenty-one) days.    . Wound Dressings (RADIAPLEXRX EX) Apply 170 g topically.     No current facility-administered medications for this visit.    OBJECTIVE: Middle-aged [place] American woman who appears stated age 64 Vitals:   01/19/15 1306  BP: 117/62  Pulse: 88  Temp: 98.4 F (36.9 C)  Resp: 18     Body mass index is 32.07 kg/(m^2).      ECOG FS:1 Sclerae unicteric, pupils round and equal Oropharynx clear, no sores or thrush noted No cervical or supraclavicular adenopathy Lungs no rales or rhonchi Heart regular rate and rhythm Abd soft, obese, nontender, positive bowel sounds MSK no focal spinal tenderness, no upper extremity lymphedema Neuro: nonfocal, well oriented, appropriate affect Breasts: The right breast is status post lumpectomy and radiation. There is minimal hyperpigmentation. There is some tenderness to palpation which is not unusual at this stage. There is no evidence of local recurrence. The right axilla is benign per the left breast is unremarkable.   LAB RESULTS:  CMP     Component Value Date/Time   NA 143 11/29/2014 1335   NA 142 04/07/2014 1320   K 3.8 11/29/2014 1335   K 4.2 04/07/2014 1320   CL 103 04/07/2014 1320   CO2 25 11/29/2014 1335   CO2 25 04/07/2014 1320   GLUCOSE 134 11/29/2014 1335   GLUCOSE 126* 04/07/2014 1320   BUN 12.1 11/29/2014 1335    BUN 16 04/07/2014 1320   CREATININE 0.8 11/29/2014 1335   CREATININE 0.56 04/07/2014 1320   CALCIUM 9.6 11/29/2014 1335   CALCIUM 9.4 04/07/2014 1320   PROT 7.1 11/29/2014 1335   PROT 7.8 04/07/2014 1320   ALBUMIN 3.8 11/29/2014 1335   ALBUMIN 3.9 04/07/2014 1320   AST 20 11/29/2014 1335   AST 20 04/07/2014 1320   ALT 17 11/29/2014 1335   ALT 19 04/07/2014 1320   ALKPHOS 60 11/29/2014 1335   ALKPHOS 68 04/07/2014 1320   BILITOT <0.20 11/29/2014 1335   BILITOT 0.2* 04/07/2014 1320   GFRNONAA >90 04/07/2014 1320   GFRAA >90 04/07/2014 1320    I No results found for: SPEP  Lab Results  Component Value Date   WBC 4.8 01/19/2015   NEUTROABS 3.2 01/19/2015   HGB 12.5 01/19/2015   HCT 40.3 01/19/2015   MCV 84.1 01/19/2015   PLT 207 01/19/2015  Chemistry      Component Value Date/Time   NA 143 11/29/2014 1335   NA 142 04/07/2014 1320   K 3.8 11/29/2014 1335   K 4.2 04/07/2014 1320   CL 103 04/07/2014 1320   CO2 25 11/29/2014 1335   CO2 25 04/07/2014 1320   BUN 12.1 11/29/2014 1335   BUN 16 04/07/2014 1320   CREATININE 0.8 11/29/2014 1335   CREATININE 0.56 04/07/2014 1320      Component Value Date/Time   CALCIUM 9.6 11/29/2014 1335   CALCIUM 9.4 04/07/2014 1320   ALKPHOS 60 11/29/2014 1335   ALKPHOS 68 04/07/2014 1320   AST 20 11/29/2014 1335   AST 20 04/07/2014 1320   ALT 17 11/29/2014 1335   ALT 19 04/07/2014 1320   BILITOT <0.20 11/29/2014 1335   BILITOT 0.2* 04/07/2014 1320       No results found for: LABCA2  No components found for: LABCA125  No results for input(s): INR in the last 168 hours.  Urinalysis No results found for: COLORURINE  STUDIES: CLINICAL DATA: Postmenopausal. Baseline exam. History of diabetes type 2 psoriatic arthritis. Previous chemotherapy for cancer  EXAM: DUAL X-RAY ABSORPTIOMETRY (DXA) FOR BONE MINERAL DENSITY  FINDINGS: AP LUMBAR SPINE L1, L3 and L4  Bone Mineral Density (BMD): 0.982 g/cm2  Young  Adult T-Score: -0.6  Z-Score: 0.3  LEFT FEMUR NECK  Bone Mineral Density (BMD): 0.884 g/cm2  Young Adult T-Score: 0.3  Z-Score: 0.7  ASSESSMENT: Patient's diagnostic category is NORMAL by WHO Criteria.  FRACTURE RISK: Not significantly increased  FRAX: Not assessed, all T-scores at or above -1.0.  COMPARISON: None.   ASSESSMENT: 64 y.o. [place],woman status post right lumpectomy and sentinel lymph node sampling 04/11/2014 for a pT2 pN1, stage IIB invasive ductal carcinoma, grade 3, estrogen receptor strongly positive, progesterone receptor negative, with an MIB-1 of 94% and HER-2 amplification  (1) additional surgery in 06/06/2014 for margin clearance showed no evidence of residual malignancy  (2) received the first of six planned cycles of adjuvant chemotherapy with carboplatin docetaxel and trastuzumab 05/02/2014, with chemotherapy interrupted because of her intervening surgery; chemo resumed 06/23/2014, completing 6 cycles 09/15/2014  (3) continuing trastuzumab through May 2016 to complete 1 year  (a) most recent echo 10/18/2014 showed a well preserved ejection fraction.  (4) adjuvant radiation completed 12/07/2014 Right breast (high tangents)/ 50.4 Gray @ 1.8 Gray per fraction x 28 fractions Right breast boost / 10 Gray at [employer] per fraction x 5 fractions  (5) anastrozole started 01/19/2015  PLAN:  Jodelle has completed her local treatment with surgery and radiation, and will complete her anti-HER-2 treatments mid May. She is now ready to start her antiestrogen pills  We discussed the difference between tamoxifen and anastrozole and she has a good understanding of the possible toxicities side effects and complications of both these agents. Given her good bone density, I think anastrozole would be the better choice for her, and I went ahead and place a prescription in for her. I warned her regarding cost issues and if the cost turns out to be more than  about $12 a month she will let us know.  She is due for repeat echocardiogram and that order was entered. She she will receive Herceptin today and every 3 weeks through mid May. She will see [place] again in about 3 months, at which time we will assess her tolerance of anastrozole. If she tolerates it well the plan will be to continue that for 5 years  Brandy Jackson has a good understanding of the overall plan. She agrees with it. She knows the goal of treatment in her case is cure. She will call with any problems that may develop before her next visit here.    Brandy Jackson, [place] [phone] 01/19/2015 1:07 PM

## 2015-01-19 NOTE — Addendum Note (Signed)
Addended by: Laureen Abrahams on: 01/19/2015 04:56 PM   Modules accepted: Orders, Medications

## 2015-01-19 NOTE — Patient Instructions (Signed)
[facility] Discharge Instructions for Patients Receiving Chemotherapy  Today you received the following chemotherapy agents Herceptin  To help prevent nausea and vomiting after your treatment, we encourage you to take your nausea medication     If you develop nausea and vomiting that is not controlled by your nausea medication, call the clinic.   BELOW ARE SYMPTOMS THAT SHOULD BE REPORTED IMMEDIATELY:  *FEVER GREATER THAN 100.5 F  *CHILLS WITH OR WITHOUT FEVER  NAUSEA AND VOMITING THAT IS NOT CONTROLLED WITH YOUR NAUSEA MEDICATION  *UNUSUAL SHORTNESS OF BREATH  *UNUSUAL BRUISING OR BLEEDING  TENDERNESS IN MOUTH AND THROAT WITH OR WITHOUT PRESENCE OF ULCERS  *URINARY PROBLEMS  *BOWEL PROBLEMS  UNUSUAL RASH Items with * indicate a potential emergency and should be followed up as soon as possible.  Feel free to call the clinic you have any questions or concerns. The clinic phone number is [phone].    

## 2015-01-23 ENCOUNTER — Telehealth (HOSPITAL_COMMUNITY): Payer: Self-pay | Admitting: Unknown Physician Specialty

## 2015-01-23 ENCOUNTER — Telehealth: Payer: Self-pay | Admitting: Oncology

## 2015-01-23 NOTE — Telephone Encounter (Signed)
Confirm appointment for ECHO. °

## 2015-01-24 ENCOUNTER — Ambulatory Visit (HOSPITAL_COMMUNITY): Payer: BLUE CROSS/BLUE SHIELD | Attending: Internal Medicine

## 2015-01-24 ENCOUNTER — Telehealth: Payer: Self-pay | Admitting: Oncology

## 2015-01-24 NOTE — Telephone Encounter (Signed)
per pof to sch pt appt-cld & left pt a message of appt times & date

## 2015-01-26 NOTE — Telephone Encounter (Signed)
none

## 2015-02-02 ENCOUNTER — Other Ambulatory Visit: Payer: BC Managed Care – PPO

## 2015-02-09 ENCOUNTER — Other Ambulatory Visit (HOSPITAL_BASED_OUTPATIENT_CLINIC_OR_DEPARTMENT_OTHER): Payer: BLUE CROSS/BLUE SHIELD

## 2015-02-09 ENCOUNTER — Ambulatory Visit (HOSPITAL_BASED_OUTPATIENT_CLINIC_OR_DEPARTMENT_OTHER): Payer: BLUE CROSS/BLUE SHIELD

## 2015-02-09 ENCOUNTER — Telehealth: Payer: Self-pay | Admitting: *Deleted

## 2015-02-09 ENCOUNTER — Ambulatory Visit (HOSPITAL_COMMUNITY)
Admission: RE | Admit: 2015-02-09 | Discharge: 2015-02-09 | Disposition: A | Discharge status: Home / Self Care | Payer: BLUE CROSS/BLUE SHIELD | Source: Ambulatory Visit | Attending: Oncology | Admitting: Oncology

## 2015-02-09 DIAGNOSIS — Z853 Personal history of malignant neoplasm of breast: Secondary | ICD-10-CM | POA: Insufficient documentation

## 2015-02-09 DIAGNOSIS — C50411 Malignant neoplasm of upper-outer quadrant of right female breast: Secondary | ICD-10-CM

## 2015-02-09 DIAGNOSIS — Z87891 Personal history of nicotine dependence: Secondary | ICD-10-CM | POA: Insufficient documentation

## 2015-02-09 DIAGNOSIS — R0602 Shortness of breath: Secondary | ICD-10-CM | POA: Insufficient documentation

## 2015-02-09 DIAGNOSIS — Z5112 Encounter for antineoplastic immunotherapy: Secondary | ICD-10-CM

## 2015-02-09 LAB — CBC WITH DIFFERENTIAL/PLATELET
BASO%: 0.8 % (ref 0.0–2.0)
BASOS ABS: 0 10*3/uL (ref 0.0–0.1)
EOS ABS: 0 10*3/uL (ref 0.0–0.5)
EOS%: 0.1 % (ref 0.0–7.0)
HEMATOCRIT: 37.8 % (ref 34.8–46.6)
HGB: 11.9 g/dL (ref 11.6–15.9)
LYMPH%: 22.5 % (ref 14.0–49.7)
MCH: 26.3 pg (ref 25.1–34.0)
MCHC: 31.5 g/dL (ref 31.5–36.0)
MCV: 83.4 fL (ref 79.5–101.0)
MONO#: 0.4 10*3/uL (ref 0.1–0.9)
MONO%: 6.9 % (ref 0.0–14.0)
NEUT#: 3.7 10*3/uL (ref 1.5–6.5)
NEUT%: 69.7 % (ref 38.4–76.8)
Platelets: 234 10*3/uL (ref 145–400)
RBC: 4.53 10*6/uL (ref 3.70–5.45)
RDW: 13.7 % (ref 11.2–14.5)
WBC: 5.3 10*3/uL (ref 3.9–10.3)
lymph#: 1.2 10*3/uL (ref 0.9–3.3)

## 2015-02-09 LAB — COMPREHENSIVE METABOLIC PANEL (CC13)
ALT: 18 U/L (ref 0–55)
ANION GAP: 13 meq/L — AB (ref 3–11)
AST: 19 U/L (ref 5–34)
Albumin: 3.8 g/dL (ref 3.5–5.0)
Alkaline Phosphatase: 65 U/L (ref 40–150)
BUN: 17.6 mg/dL (ref 7.0–26.0)
CALCIUM: 9.5 mg/dL (ref 8.4–10.4)
CO2: 22 mEq/L (ref 22–29)
CREATININE: 0.8 mg/dL (ref 0.6–1.1)
Chloride: 108 mEq/L (ref 98–109)
EGFR: >90 mL/min/{1.73_m2} (ref >90)
GLUCOSE: 137 mg/dL (ref 70–140)
Potassium: 3.7 mEq/L (ref 3.5–5.1)
SODIUM: 144 meq/L (ref 136–145)
TOTAL PROTEIN: 7.2 g/dL (ref 6.4–8.3)
Total Bilirubin: 0.24 mg/dL (ref 0.20–1.20)

## 2015-02-09 MED ORDER — DIPHENHYDRAMINE HCL 25 MG PO CAPS
25.0000 mg | ORAL_CAPSULE | Freq: Once | ORAL | Status: AC
  Administered 2015-02-09: 25 mg via ORAL

## 2015-02-09 MED ORDER — SODIUM CHLORIDE 0.9 % IV SOLN
Freq: Once | INTRAVENOUS | Status: AC
  Administered 2015-02-09: 16:00:00 via INTRAVENOUS

## 2015-02-09 MED ORDER — ACETAMINOPHEN 325 MG PO TABS
ORAL_TABLET | ORAL | Status: AC
  Filled 2015-02-09: qty 2

## 2015-02-09 MED ORDER — TRASTUZUMAB CHEMO INJECTION 440 MG
6.0000 mg/kg | Freq: Once | INTRAVENOUS | Status: AC
  Administered 2015-02-09: 567 mg via INTRAVENOUS
  Filled 2015-02-09: qty 27

## 2015-02-09 MED ORDER — SODIUM CHLORIDE 0.9 % IJ SOLN
10.0000 mL | INTRAMUSCULAR | Status: DC | PRN
  Administered 2015-02-09: 10 mL
  Filled 2015-02-09: qty 10

## 2015-02-09 MED ORDER — ACETAMINOPHEN 325 MG PO TABS
650.0000 mg | ORAL_TABLET | Freq: Once | ORAL | Status: AC
Start: 2015-02-09 — End: 2015-02-09
  Administered 2015-02-09: 650 mg via ORAL

## 2015-02-09 MED ORDER — HEPARIN SOD (PORK) LOCK FLUSH 100 UNIT/ML IV SOLN
500.0000 [IU] | Freq: Once | INTRAVENOUS | Status: AC | PRN
  Administered 2015-02-09: 500 [IU]
  Filled 2015-02-09: qty 5

## 2015-02-09 MED ORDER — DIPHENHYDRAMINE HCL 25 MG PO CAPS
ORAL_CAPSULE | ORAL | Status: AC
  Filled 2015-02-09: qty 1

## 2015-02-09 NOTE — Telephone Encounter (Signed)
This RN was contacted by RN in treatment room.  RN states pt is complaining of onset of coughing since starting the arimidex- pt states to nurse " Dr Jana Hakim said if I had any issues he would change my medication "  Plan per above is pt will proceed to obtain an XRAY today - she will hold her medication at present.  Post MD review of XRAY this RN will call pt on Monday with recommendations.  Pt made aware of above request.

## 2015-02-09 NOTE — Patient Instructions (Signed)
[facility] Discharge Instructions for Patients Receiving Chemotherapy  Today you received the following chemotherapy agents Herceptin.  To help prevent nausea and vomiting after your treatment, we encourage you to take your nausea medication as prescribed.   If you develop nausea and vomiting that is not controlled by your nausea medication, call the clinic.   BELOW ARE SYMPTOMS THAT SHOULD BE REPORTED IMMEDIATELY:  *FEVER GREATER THAN 100.5 F  *CHILLS WITH OR WITHOUT FEVER  NAUSEA AND VOMITING THAT IS NOT CONTROLLED WITH YOUR NAUSEA MEDICATION  *UNUSUAL SHORTNESS OF BREATH  *UNUSUAL BRUISING OR BLEEDING  TENDERNESS IN MOUTH AND THROAT WITH OR WITHOUT PRESENCE OF ULCERS  *URINARY PROBLEMS  *BOWEL PROBLEMS  UNUSUAL RASH Items with * indicate a potential emergency and should be followed up as soon as possible.  Feel free to call the clinic you have any questions or concerns. The clinic phone number is [phone].    

## 2015-02-14 ENCOUNTER — Telehealth: Payer: Self-pay | Admitting: Oncology

## 2015-02-14 ENCOUNTER — Other Ambulatory Visit: Payer: Self-pay | Admitting: Oncology

## 2015-02-14 DIAGNOSIS — C50919 Malignant neoplasm of unspecified site of unspecified female breast: Secondary | ICD-10-CM

## 2015-02-14 NOTE — Telephone Encounter (Signed)
per pof to sch pt [facility] -will sch after reply

## 2015-02-23 ENCOUNTER — Other Ambulatory Visit: Payer: BC Managed Care – PPO

## 2015-03-01 ENCOUNTER — Telehealth: Payer: Self-pay | Admitting: Oncology

## 2015-03-01 ENCOUNTER — Other Ambulatory Visit: Payer: Self-pay | Admitting: *Deleted

## 2015-03-01 DIAGNOSIS — C50411 Malignant neoplasm of upper-outer quadrant of right female breast: Secondary | ICD-10-CM

## 2015-03-01 NOTE — Telephone Encounter (Signed)
Called and left a message to call and reschedule her echo,number given    anne

## 2015-03-02 ENCOUNTER — Ambulatory Visit (HOSPITAL_BASED_OUTPATIENT_CLINIC_OR_DEPARTMENT_OTHER): Payer: BLUE CROSS/BLUE SHIELD

## 2015-03-02 ENCOUNTER — Ambulatory Visit: Payer: BLUE CROSS/BLUE SHIELD

## 2015-03-02 ENCOUNTER — Other Ambulatory Visit: Payer: Self-pay | Admitting: *Deleted

## 2015-03-02 ENCOUNTER — Other Ambulatory Visit: Payer: Self-pay | Admitting: Nurse Practitioner

## 2015-03-02 ENCOUNTER — Other Ambulatory Visit (HOSPITAL_BASED_OUTPATIENT_CLINIC_OR_DEPARTMENT_OTHER): Payer: BLUE CROSS/BLUE SHIELD

## 2015-03-02 DIAGNOSIS — C50411 Malignant neoplasm of upper-outer quadrant of right female breast: Secondary | ICD-10-CM

## 2015-03-02 DIAGNOSIS — Z95828 Presence of other vascular implants and grafts: Secondary | ICD-10-CM

## 2015-03-02 DIAGNOSIS — Z5112 Encounter for antineoplastic immunotherapy: Secondary | ICD-10-CM

## 2015-03-02 DIAGNOSIS — E559 Vitamin D deficiency, unspecified: Secondary | ICD-10-CM

## 2015-03-02 LAB — COMPREHENSIVE METABOLIC PANEL (CC13)
ALBUMIN: 3.6 g/dL (ref 3.5–5.0)
ALT: 17 U/L (ref 0–55)
ANION GAP: 12 meq/L — AB (ref 3–11)
AST: 16 U/L (ref 5–34)
Alkaline Phosphatase: 66 U/L (ref 40–150)
BILIRUBIN TOTAL: 0.2 mg/dL (ref 0.20–1.20)
BUN: 16.3 mg/dL (ref 7.0–26.0)
CO2: 24 meq/L (ref 22–29)
Calcium: 9.1 mg/dL (ref 8.4–10.4)
Chloride: 108 mEq/L (ref 98–109)
Creatinine: 0.8 mg/dL (ref 0.6–1.1)
Glucose: 117 mg/dl (ref 70–140)
Potassium: 3.7 mEq/L (ref 3.5–5.1)
Sodium: 143 mEq/L (ref 136–145)
Total Protein: 7.1 g/dL (ref 6.4–8.3)

## 2015-03-02 LAB — CBC WITH DIFFERENTIAL/PLATELET
BASO%: 0.9 % (ref 0.0–2.0)
Basophils Absolute: 0.1 10*3/uL (ref 0.0–0.1)
EOS%: 0 % (ref 0.0–7.0)
Eosinophils Absolute: 0 10*3/uL (ref 0.0–0.5)
HCT: 38 % (ref 34.8–46.6)
HEMOGLOBIN: 12.1 g/dL (ref 11.6–15.9)
LYMPH#: 1.2 10*3/uL (ref 0.9–3.3)
LYMPH%: 21.8 % (ref 14.0–49.7)
MCH: 26.2 pg (ref 25.1–34.0)
MCHC: 31.9 g/dL (ref 31.5–36.0)
MCV: 82.2 fL (ref 79.5–101.0)
MONO#: 0.5 10*3/uL (ref 0.1–0.9)
MONO%: 8.4 % (ref 0.0–14.0)
NEUT#: 3.9 10*3/uL (ref 1.5–6.5)
NEUT%: 68.9 % (ref 38.4–76.8)
Platelets: 236 10*3/uL (ref 145–400)
RBC: 4.62 10*6/uL (ref 3.70–5.45)
RDW: 14.3 % (ref 11.2–14.5)
WBC: 5.6 10*3/uL (ref 3.9–10.3)

## 2015-03-02 MED ORDER — SODIUM CHLORIDE 0.9 % IV SOLN
Freq: Once | INTRAVENOUS | Status: AC
  Administered 2015-03-02: 16:00:00 via INTRAVENOUS

## 2015-03-02 MED ORDER — ACETAMINOPHEN 325 MG PO TABS
650.0000 mg | ORAL_TABLET | Freq: Once | ORAL | Status: AC
  Administered 2015-03-02: 650 mg via ORAL

## 2015-03-02 MED ORDER — ACETAMINOPHEN 325 MG PO TABS
ORAL_TABLET | ORAL | Status: AC
  Filled 2015-03-02: qty 2

## 2015-03-02 MED ORDER — DIPHENHYDRAMINE HCL 25 MG PO CAPS
ORAL_CAPSULE | ORAL | Status: AC
  Filled 2015-03-02: qty 1

## 2015-03-02 MED ORDER — SODIUM CHLORIDE 0.9 % IJ SOLN
10.0000 mL | INTRAMUSCULAR | Status: DC | PRN
  Administered 2015-03-02: 10 mL via INTRAVENOUS
  Filled 2015-03-02: qty 10

## 2015-03-02 MED ORDER — SODIUM CHLORIDE 0.9 % IJ SOLN
10.0000 mL | INTRAMUSCULAR | Status: DC | PRN
  Administered 2015-03-02: 10 mL
  Filled 2015-03-02: qty 10

## 2015-03-02 MED ORDER — HEPARIN SOD (PORK) LOCK FLUSH 100 UNIT/ML IV SOLN
500.0000 [IU] | Freq: Once | INTRAVENOUS | Status: AC | PRN
  Administered 2015-03-02: 500 [IU]
  Filled 2015-03-02: qty 5

## 2015-03-02 MED ORDER — DIPHENHYDRAMINE HCL 25 MG PO CAPS
25.0000 mg | ORAL_CAPSULE | Freq: Once | ORAL | Status: AC
  Administered 2015-03-02: 25 mg via ORAL

## 2015-03-02 MED ORDER — SODIUM CHLORIDE 0.9 % IV SOLN
6.0000 mg/kg | Freq: Once | INTRAVENOUS | Status: AC
  Administered 2015-03-02: 567 mg via INTRAVENOUS
  Filled 2015-03-02: qty 27

## 2015-03-02 NOTE — Progress Notes (Signed)
Last echo 10/2014 EF 68%. Patient missed recent echo, will reschedule, new orders placed and patient to reschedule with cardiology. OK to treat today with Herceptin per Gentry Fitz, NP.   Patient comes to infusion appt with many questions today, including: wanting to know results of recent chest xray; discussing tamoxifen therapy due to stopping anastrazole; asking for Vitamin D rx; wanting to take biotin for brittle nails; inquiring about whether it is normal for breast and back to hurt after radiation; and when is last dose of herceptin scheduled?  Per Gentry Fitz, NP: CXR showed no obvious infection or pneumonia that would cause cough, likely a combination of factors including weather changes. NP suggests adding an appointment on April 8 to discuss beginning new therapies such as tamoxifen and vitamin D after drawing vit D level, patient agrees to this. POF to be placed. NP states it is normal for breast and XRT sites to be painful after radiation for undefined period of time, it differs for each patient. Patient informed of the above and verbalizes understanding.   Brandy Jackson wants to know last dose of Herceptin. Per schedule and per pharmacy, last planned dose is 05/25/15 but may be subject to change due to missed doses early in treatment plan. She understands the end date is not set in stone.

## 2015-03-02 NOTE — Patient Instructions (Signed)

## 2015-03-02 NOTE — Patient Instructions (Addendum)
[facility] Discharge Instructions for Patients Receiving Chemotherapy  Today you received the following chemotherapy agents Herceptin To help prevent nausea and vomiting after your treatment, we encourage you to take your nausea medication as prescribed.  If you develop nausea and vomiting that is not controlled by your nausea medication, call the clinic.   BELOW ARE SYMPTOMS THAT SHOULD BE REPORTED IMMEDIATELY:  *FEVER GREATER THAN 100.5 F  *CHILLS WITH OR WITHOUT FEVER  NAUSEA AND VOMITING THAT IS NOT CONTROLLED WITH YOUR NAUSEA MEDICATION  *UNUSUAL SHORTNESS OF BREATH  *UNUSUAL BRUISING OR BLEEDING  TENDERNESS IN MOUTH AND THROAT WITH OR WITHOUT PRESENCE OF ULCERS  *URINARY PROBLEMS  *BOWEL PROBLEMS  UNUSUAL RASH Items with * indicate a potential emergency and should be followed up as soon as possible.  Feel free to call the clinic you have any questions or concerns. The clinic phone number is [phone].   Your Appointment for April as been scheduled, they will not call you  To  Remind you.

## 2015-03-05 ENCOUNTER — Telehealth: Payer: Self-pay | Admitting: Nurse Practitioner

## 2015-03-05 NOTE — Telephone Encounter (Signed)
Called and left a message with 4/8 and 4/11 appointments

## 2015-03-14 ENCOUNTER — Telehealth: Payer: Self-pay | Admitting: Nurse Practitioner

## 2015-03-14 NOTE — Telephone Encounter (Signed)
pt left vm thinking missed appt-cld & left message to adv appt is 4/8 gave tim

## 2015-03-16 ENCOUNTER — Other Ambulatory Visit (HOSPITAL_BASED_OUTPATIENT_CLINIC_OR_DEPARTMENT_OTHER): Payer: BLUE CROSS/BLUE SHIELD

## 2015-03-16 ENCOUNTER — Encounter: Payer: Self-pay | Admitting: Nurse Practitioner

## 2015-03-16 ENCOUNTER — Ambulatory Visit (HOSPITAL_BASED_OUTPATIENT_CLINIC_OR_DEPARTMENT_OTHER): Payer: BLUE CROSS/BLUE SHIELD | Admitting: Nurse Practitioner

## 2015-03-16 ENCOUNTER — Other Ambulatory Visit: Payer: BC Managed Care – PPO

## 2015-03-16 VITALS — BP 127/54 | HR 87 | Temp 97.0°F | Resp 18 | Ht 67.0 in | Wt 204.8 lb

## 2015-03-16 DIAGNOSIS — C50411 Malignant neoplasm of upper-outer quadrant of right female breast: Secondary | ICD-10-CM

## 2015-03-16 DIAGNOSIS — G4700 Insomnia, unspecified: Secondary | ICD-10-CM | POA: Diagnosis not present

## 2015-03-16 DIAGNOSIS — F39 Unspecified mood [affective] disorder: Secondary | ICD-10-CM

## 2015-03-16 DIAGNOSIS — R4586 Emotional lability: Secondary | ICD-10-CM

## 2015-03-16 DIAGNOSIS — E559 Vitamin D deficiency, unspecified: Secondary | ICD-10-CM

## 2015-03-16 LAB — CBC WITH DIFFERENTIAL/PLATELET
BASO%: 0.5 % (ref 0.0–2.0)
Basophils Absolute: 0 10*3/uL (ref 0.0–0.1)
EOS ABS: 0 10*3/uL (ref 0.0–0.5)
EOS%: 0 % (ref 0.0–7.0)
HCT: 37.5 % (ref 34.8–46.6)
HGB: 11.7 g/dL (ref 11.6–15.9)
LYMPH#: 1.1 10*3/uL (ref 0.9–3.3)
LYMPH%: 22.2 % (ref 14.0–49.7)
MCH: 25.8 pg (ref 25.1–34.0)
MCHC: 31.2 g/dL — AB (ref 31.5–36.0)
MCV: 82.6 fL (ref 79.5–101.0)
MONO#: 0.3 10*3/uL (ref 0.1–0.9)
MONO%: 6.2 % (ref 0.0–14.0)
NEUT#: 3.7 10*3/uL (ref 1.5–6.5)
NEUT%: 71.1 % (ref 38.4–76.8)
Platelets: 196 10*3/uL (ref 145–400)
RBC: 4.54 10*6/uL (ref 3.70–5.45)
RDW: 14.5 % (ref 11.2–14.5)
WBC: 5.1 10*3/uL (ref 3.9–10.3)

## 2015-03-16 LAB — COMPREHENSIVE METABOLIC PANEL (CC13)
ALT: 18 U/L (ref 0–55)
AST: 18 U/L (ref 5–34)
Albumin: 3.7 g/dL (ref 3.5–5.0)
Alkaline Phosphatase: 70 U/L (ref 40–150)
Anion Gap: 13 mEq/L — ABNORMAL HIGH (ref 3–11)
BUN: 18 mg/dL (ref 7.0–26.0)
CALCIUM: 9 mg/dL (ref 8.4–10.4)
CO2: 22 meq/L (ref 22–29)
Chloride: 108 mEq/L (ref 98–109)
Creatinine: 0.7 mg/dL (ref 0.6–1.1)
EGFR: >90 mL/min/{1.73_m2} (ref >90)
Glucose: 140 mg/dl (ref 70–140)
Potassium: 3.6 mEq/L (ref 3.5–5.1)
SODIUM: 144 meq/L (ref 136–145)
Total Bilirubin: 0.3 mg/dL (ref 0.20–1.20)
Total Protein: 6.9 g/dL (ref 6.4–8.3)

## 2015-03-16 NOTE — Progress Notes (Signed)
ID: [name] OB: 12-01-1951  MR#: [id number]  YHC#:[id number]  PCP: Ricke Hey, MD GYN:   SU: Dr. Erroll Luna OTHER MD:  CHIEF COMPLAINT:  Right-sided breast cancer  CURRENT THERAPY: Trastuzumab, and anastrozole  BREAST CANCER HISTORY: From the original consult note:  "Brandy Jackson is a 64 y.o. female. Patient palpated a right breast mass. She had a mammogram performed and she was noted to have a mass in the upper outer quadrant measuring 2.1 x 2.0 x 1.7 cm. 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 Tumor was ER positive PR positive HER-2/neu positive (triple positive) with a proliferation marker Ki-67 at 90%. She had MRI of the breasts performed which showed a solitary mass in the upper outer quadrant measuring 2.5 cm. Her case was discussed at the multidisciplinary breast conference."  Her subsequent treatment is as detailed below.   INTERVAL HISTORY:  Brandy Jackson returns today for follow up of her breast cancer, accompanied by her husband. She is here discuss her brief time on anastrozole. She only took the drug for 1 week in mid February. She complained of insomnia and one major mood swing where she yelled at her husband and random episodes of crying after this. She is convinced that this is due to starting anastrozole, but neither insomnia nor mood changes are true side effects of this drug, and insomnia was a problem she actually had before her cancer developed.   REVIEW OF SYSTEMS:  Brandy Jackson denies fevers, chills, nausea, vomiting, or changes in bowel or bladder habits. She has no shortness of breath, chest pain, cough, or palpitations. She has a history of psoriasis, and has a new outbreak to her right leg.she does not believe her talconex is managing this well. She has a history of arthritis and this has been no worse lately. A detailed review of systems is otherwise stable.   PAST MEDICAL HISTORY: Past Medical History  Diagnosis Date   . Diabetes mellitus without complication   . Arthritis   . Hot flashes   . Cancer   . Breast cancer   . Wears glasses   . Wears partial dentures     bottom  . Radiation 10/18/14-12/07/14    Invasive ductal carcinoma    PAST SURGICAL HISTORY: Past Surgical History  Procedure Laterality Date  . Bladder repair w/ cesarean section    . Colonoscopy    . Portacath placement N/A 04/11/2014    Procedure: INSERTION PORT-A-CATH;  Surgeon: Joyice Faster. Cornett, MD;  Location: [facility];  Service: General;  Laterality: N/A;  . Re-excision of breast lumpectomy Right 06/06/2014    Procedure: RE-EXCISION OF BREAST LUMPECTOMY;  Surgeon: Marcello Moores A. Cornett, MD;  Location: [facility];  Service: General;  Laterality: Right;    FAMILY HISTORY No family history on file.  GYNECOLOGIC HISTORY: menarche at age 60, G65 P3, went through menopause at age 67.  No h/o of HRT, no h/o abnormal pap smears, or sexually transmitted infections.  Does have frequent vaginal candida infections due to diabetes that she follows with her gynecologist for.    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 Her husband [name] is retired from [employer]. He is a cancer survivor himself. The patient has 3 sons, one of whom is studying to be a pediatrician. All her children are married. She has 6 grandchildren.    ADVANCED DIRECTIVES: not in place.  HEALTH MAINTENANCE: History  Substance Use Topics  . Smoking status: Former Smoker -- 1.00 packs/day    Quit date: 03/08/1986  . Smokeless tobacco: Never Used  . Alcohol Use: No     Colonoscopy: Bone Density Scan: Obtained 12/21/2014 shows a normal T score Pap Smear:  Vitamin D Level:   Lipid Panel:    Allergies  Allergen Reactions  . Sulfa Antibiotics Rash    Current Outpatient Prescriptions  Medication Sig Dispense Refill  . lidocaine-prilocaine (EMLA) cream  Apply 1 application topically as needed. (Patient taking differently: Apply 1 application topically as needed (chemo). ) 30 g 2  . metFORMIN (GLUCOPHAGE) 500 MG tablet Take 500 mg by mouth 2 (two) times daily with a meal.    . TACLONEX external suspension Apply 1 application topically daily. To scalp for sorosis  0  . trastuzumab (HERCEPTIN) 440 MG injection Inject 440 mg into the vein every 21 ( twenty-one) days.    Brandy Kitchen anastrozole (ARIMIDEX) 1 MG tablet Take 1 tablet (1 mg total) by mouth daily. (Patient not taking: Reported on 03/16/2015) 90 tablet 4  . clotrimazole-betamethasone (LOTRISONE) lotion Apply 1 application topically at bedtime as needed (itching).   0   No current facility-administered medications for this visit.    OBJECTIVE: Middle-aged [place] American woman who appears stated age 89 Vitals:   03/16/15 1338  BP: 127/54  Pulse: 87  Temp: 97 F (36.1 C)  Resp: 18     Body mass index is 32.07 kg/(m^2).      ECOG FS:1  Skin: warm, dry  HEENT: sclerae anicteric, conjunctivae pink, oropharynx clear. No thrush or mucositis.  Lymph Nodes: No cervical or supraclavicular lymphadenopathy  Lungs: clear to auscultation bilaterally, no rales, wheezes, or rhonci  Heart: regular rate and rhythm  Abdomen: round, soft, non tender, positive bowel sounds  Musculoskeletal: No focal spinal tenderness, no peripheral edema  Neuro: non focal, well oriented, positive affect  Breasts: deferred  LAB RESULTS:  CMP     Component Value Date/Time   NA 144 03/16/2015 1323   NA 142 04/07/2014 1320   K 3.6 03/16/2015 1323   K 4.2 04/07/2014 1320   CL 103 04/07/2014 1320   CO2 22 03/16/2015 1323   CO2 25 04/07/2014 1320   GLUCOSE 140 03/16/2015 1323   GLUCOSE 126* 04/07/2014 1320   BUN 18.0 03/16/2015 1323   BUN 16 04/07/2014 1320   CREATININE 0.7 03/16/2015 1323   CREATININE 0.56 04/07/2014 1320   CALCIUM 9.0 03/16/2015 1323   CALCIUM 9.4 04/07/2014 1320   PROT 6.9 03/16/2015 1323    PROT 7.8 04/07/2014 1320   ALBUMIN 3.7 03/16/2015 1323   ALBUMIN 3.9 04/07/2014 1320   AST 18 03/16/2015 1323   AST 20 04/07/2014 1320   ALT 18 03/16/2015 1323   ALT 19 04/07/2014 1320   ALKPHOS 70 03/16/2015 1323   ALKPHOS 68 04/07/2014 1320   BILITOT 0.30 03/16/2015 1323   BILITOT 0.2* 04/07/2014 1320   GFRNONAA >90 04/07/2014 1320   GFRAA >90 04/07/2014 1320    I No results found for: SPEP  Lab Results  Component Value Date   WBC 5.1 03/16/2015   NEUTROABS 3.7 03/16/2015   HGB 11.7 03/16/2015   HCT 37.5 03/16/2015   MCV 82.6 03/16/2015   PLT 196 03/16/2015      Chemistry      Component Value Date/Time   NA 144 03/16/2015 1323   NA 142 04/07/2014 1320   K 3.6 03/16/2015 1323  K 4.2 04/07/2014 1320   CL 103 04/07/2014 1320   CO2 22 03/16/2015 1323   CO2 25 04/07/2014 1320   BUN 18.0 03/16/2015 1323   BUN 16 04/07/2014 1320   CREATININE 0.7 03/16/2015 1323   CREATININE 0.56 04/07/2014 1320      Component Value Date/Time   CALCIUM 9.0 03/16/2015 1323   CALCIUM 9.4 04/07/2014 1320   ALKPHOS 70 03/16/2015 1323   ALKPHOS 68 04/07/2014 1320   AST 18 03/16/2015 1323   AST 20 04/07/2014 1320   ALT 18 03/16/2015 1323   ALT 19 04/07/2014 1320   BILITOT 0.30 03/16/2015 1323   BILITOT 0.2* 04/07/2014 1320       No results found for: LABCA2  No components found for: LABCA125  No results for input(s): INR in the last 168 hours.  Urinalysis No results found for: COLORURINE  STUDIES: Most recent bone density scan showed a t-score of -0.6 (normal).  ASSESSMENT: 64 y.o. [place],woman status post right lumpectomy and sentinel lymph node sampling 04/11/2014 for a pT2 pN1, stage IIB invasive ductal carcinoma, grade 3, estrogen receptor strongly positive, progesterone receptor negative, with an MIB-1 of 94% and HER-2 amplification  (1) additional surgery in 06/06/2014 for margin clearance showed no evidence of residual malignancy  (2) received the first  of six planned cycles of adjuvant chemotherapy with carboplatin docetaxel and trastuzumab 05/02/2014, with chemotherapy interrupted because of her intervening surgery; chemo resumed 06/23/2014, completing 6 cycles 09/15/2014  (3) continuing trastuzumab through May 2016 to complete 1 year  (a) most recent echo 10/18/2014 showed a well preserved ejection fraction.  (4) adjuvant radiation completed 12/07/2014 Right breast (high tangents)/ 50.4 Gray @ 1.8 Gray per fraction x 28 fractions Right breast boost / 10 Gray at [employer] per fraction x 5 fractions  (5) anastrozole started 01/19/2015. Stopped after 1 week because of mood changes. Restarted on 03/17/15  PLAN:  Elveria and I spent about 30 minutes discussing the concept of antiestrogen therapy specifically centering around tamoxifen vs anastrozole. I gave her a handout that delineated the common side effects of both of these drugs from common menopausal symptoms. As discussed above mood changes, weight gain, and insomnia are going to have more to do with her post menopausal state than it is with the anastrozole. 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 Some women are on antidepressants during this time, which we discussed, but she is currently not interested. Tamelia was not on the anastrozole long enough, in my opinion, to truly feel one way or another about it. I encouraged her to try the drug at least one more time to determine if her symptoms were mere coincidence with starting this new drug, or if there is truly a connection. We discussed tamoxifen in detail in the event that this is the case. She will call the let the [facility] know that she is stopping the anastrozole, and I will send her a new prescription in for tamoxifen instead.   Zakyra is already scheduled for a follow up visit with me in 4 weeks on 5/6. She is to have a repeat echocardiogram on 4/11 before resuming trastuzumab every 3 weeks. She  understands and agrees with this plan. She knows the goal of treatment in her case is cure. She has been encouraged to call with any issues that might arise before her next visit here. Brandy Kitchen Brandy Panda, NP 03/16/2015 3:00 PM

## 2015-03-17 LAB — VITAMIN D 25 HYDROXY (VIT D DEFICIENCY, FRACTURES): VIT D 25 HYDROXY: 19 ng/mL — AB (ref 30–100)

## 2015-03-19 ENCOUNTER — Ambulatory Visit (HOSPITAL_COMMUNITY)
Admission: RE | Admit: 2015-03-19 | Discharge: 2015-03-19 | Disposition: A | Discharge status: Home / Self Care | Payer: BLUE CROSS/BLUE SHIELD | Source: Ambulatory Visit | Attending: Cardiology | Admitting: Cardiology

## 2015-03-19 ENCOUNTER — Ambulatory Visit (HOSPITAL_BASED_OUTPATIENT_CLINIC_OR_DEPARTMENT_OTHER)
Admission: RE | Admit: 2015-03-19 | Discharge: 2015-03-19 | Disposition: A | Discharge status: Home / Self Care | Payer: BLUE CROSS/BLUE SHIELD | Source: Ambulatory Visit | Attending: Cardiology | Admitting: Cardiology

## 2015-03-19 VITALS — BP 130/64 | HR 75 | Wt 203.8 lb

## 2015-03-19 DIAGNOSIS — Z5111 Encounter for antineoplastic chemotherapy: Secondary | ICD-10-CM | POA: Diagnosis not present

## 2015-03-19 DIAGNOSIS — C50411 Malignant neoplasm of upper-outer quadrant of right female breast: Secondary | ICD-10-CM | POA: Diagnosis present

## 2015-03-19 DIAGNOSIS — Z79899 Other long term (current) drug therapy: Secondary | ICD-10-CM | POA: Diagnosis not present

## 2015-03-19 NOTE — Progress Notes (Signed)
Patient ID: Brandy Jackson, female   DOB: Mar 08, 1951, 64 y.o.   MRN: [id number] Cardio-oncology Clinic  HPI: Brandy Jackson is a 64 y.o. woman with psoriasis. No cardiac history.  Referred by Dr. Humphrey Rolls to the cardio-oncology clinic. 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 Tumor was ER positive PR positive HER-2/neu positive (triple positive) with a proliferation marker Ki-67 at 90%. She had MRI of the breasts performed which showed a solitary mass in the upper outer quadrant measuring 2.5 cm.  She has received Taxotere/carboplatinum for a total of 6 cycles and completed radiation in 12/15.  She is getting Herceptin every 3 weeks to finish out a total of one year, she should finish in 5/16.  Doing fine. Denies CP, SOB. Occasional edema but resolves overnight. BP looks good.  Echo 04/05/14; EF 60-65% Lat s ' 10.2 GLS -17.1% Echo 07/03/14: EF 65-70% Lat s ' 10.8 GLS -18.8% Echo 10/18/14: EF 65-70% Lat s' 11.6 (hard to see) GLS -20.8% Echo 4/16: EF 60-65%, lateral s' 11.7, GLS -21.5%  PHYSICAL EXAM: Filed Vitals:   03/19/15 0855  BP: 130/64  Pulse: 75  Weight: 203 lb 12.8 oz (92.443 kg)  SpO2: 98%    General:  Well appearing. No respiratory difficulty HEENT: normal Neck: supple. no JVD. Carotids 2+ bilat; no bruits. No lymphadenopathy or thryomegaly appreciated. Cor: PMI nondisplaced. Regular rate & rhythm. No rubs, gallops or murmurs. Lungs: clear Abdomen: soft, nontender, nondistended. No hepatosplenomegaly. No bruits or masses. Good bowel sounds. Extremities: no cyanosis, clubbing, rash, tr edema. Psoriatic patch on RLE Neuro: alert & oriented x 3, cranial nerves grossly intact. moves all 4 extremities w/o difficulty. Affect pleasant.   ASSESSMENT & PLAN: 1. Breast CA:  I reviewed echos personally. EF and Doppler parameters stable. No HF on exam. Continue Herceptin. F/u with echo in 3 months, this should be her last echo as she finishes Herceptin in  5/16.  2. Edema: Resolved.   Tomoya Ringwald,MD 03/19/2015

## 2015-03-19 NOTE — Patient Instructions (Signed)
We will contact you in 3 months to schedule your next appointment and echocardiogram  

## 2015-03-19 NOTE — Progress Notes (Signed)
  Echocardiogram 2D Echocardiogram has been performed.  Brandy Jackson FRANCES 03/19/2015, 8:55 AM

## 2015-03-22 ENCOUNTER — Other Ambulatory Visit: Payer: Self-pay | Admitting: *Deleted

## 2015-03-22 DIAGNOSIS — C50411 Malignant neoplasm of upper-outer quadrant of right female breast: Secondary | ICD-10-CM

## 2015-03-23 ENCOUNTER — Ambulatory Visit (HOSPITAL_BASED_OUTPATIENT_CLINIC_OR_DEPARTMENT_OTHER): Payer: BLUE CROSS/BLUE SHIELD

## 2015-03-23 ENCOUNTER — Other Ambulatory Visit (HOSPITAL_COMMUNITY)
Admission: RE | Admit: 2015-03-23 | Discharge: 2015-03-23 | Disposition: A | Discharge status: Home / Self Care | Payer: BLUE CROSS/BLUE SHIELD | Source: Ambulatory Visit | Attending: Oncology | Admitting: Oncology

## 2015-03-23 ENCOUNTER — Other Ambulatory Visit (HOSPITAL_BASED_OUTPATIENT_CLINIC_OR_DEPARTMENT_OTHER): Payer: BLUE CROSS/BLUE SHIELD

## 2015-03-23 ENCOUNTER — Ambulatory Visit: Payer: BLUE CROSS/BLUE SHIELD

## 2015-03-23 VITALS — BP 124/51 | HR 83 | Temp 98.1°F

## 2015-03-23 DIAGNOSIS — D0592 Unspecified type of carcinoma in situ of left breast: Secondary | ICD-10-CM

## 2015-03-23 DIAGNOSIS — C50411 Malignant neoplasm of upper-outer quadrant of right female breast: Secondary | ICD-10-CM | POA: Diagnosis not present

## 2015-03-23 DIAGNOSIS — Z5112 Encounter for antineoplastic immunotherapy: Secondary | ICD-10-CM

## 2015-03-23 LAB — CBC WITH DIFFERENTIAL/PLATELET
BASO%: 0.2 % (ref 0.0–2.0)
Basophils Absolute: 0 10*3/uL (ref 0.0–0.1)
EOS%: 0 % (ref 0.0–7.0)
Eosinophils Absolute: 0 10*3/uL (ref 0.0–0.5)
HEMATOCRIT: 35.6 % (ref 34.8–46.6)
HGB: 11.8 g/dL (ref 11.6–15.9)
LYMPH%: 23.9 % (ref 14.0–49.7)
MCH: 27.1 pg (ref 25.1–34.0)
MCHC: 33.1 g/dL (ref 31.5–36.0)
MCV: 81.8 fL (ref 79.5–101.0)
MONO#: 0.4 10*3/uL (ref 0.1–0.9)
MONO%: 7.2 % (ref 0.0–14.0)
NEUT%: 68.7 % (ref 38.4–76.8)
NEUTROS ABS: 3.9 10*3/uL (ref 1.5–6.5)
PLATELETS: 215 10*3/uL (ref 145–400)
RBC: 4.35 10*6/uL (ref 3.70–5.45)
RDW: 14.5 % (ref 11.2–14.5)
WBC: 5.7 10*3/uL (ref 3.9–10.3)
lymph#: 1.4 10*3/uL (ref 0.9–3.3)
nRBC: 0 % (ref 0–0)

## 2015-03-23 LAB — COMPREHENSIVE METABOLIC PANEL (CC13)
ALT: 20 U/L (ref 0–55)
AST: 18 U/L (ref 5–34)
Albumin: 3.7 g/dL (ref 3.5–5.0)
Alkaline Phosphatase: 66 U/L (ref 40–150)
Anion Gap: 12 mEq/L — ABNORMAL HIGH (ref 3–11)
BUN: 16.6 mg/dL (ref 7.0–26.0)
CALCIUM: 7.4 mg/dL — AB (ref 8.4–10.4)
CO2: 22 meq/L (ref 22–29)
CREATININE: 0.7 mg/dL (ref 0.6–1.1)
Chloride: 108 mEq/L (ref 98–109)
EGFR: >90 mL/min/{1.73_m2} (ref >90)
Glucose: 199 mg/dl — ABNORMAL HIGH (ref 70–140)
Potassium: 3.9 mEq/L (ref 3.5–5.1)
Sodium: 142 mEq/L (ref 136–145)
Total Bilirubin: 0.27 mg/dL (ref 0.20–1.20)
Total Protein: 6.9 g/dL (ref 6.4–8.3)

## 2015-03-23 MED ORDER — SODIUM CHLORIDE 0.9 % IJ SOLN
10.0000 mL | INTRAMUSCULAR | Status: DC | PRN
  Administered 2015-03-23: 10 mL
  Filled 2015-03-23: qty 10

## 2015-03-23 MED ORDER — DIPHENHYDRAMINE HCL 25 MG PO CAPS
25.0000 mg | ORAL_CAPSULE | Freq: Once | ORAL | Status: AC
  Administered 2015-03-23: 25 mg via ORAL

## 2015-03-23 MED ORDER — HEPARIN SOD (PORK) LOCK FLUSH 100 UNIT/ML IV SOLN
500.0000 [IU] | Freq: Once | INTRAVENOUS | Status: AC | PRN
  Administered 2015-03-23: 500 [IU]
  Filled 2015-03-23: qty 5

## 2015-03-23 MED ORDER — ACETAMINOPHEN 325 MG PO TABS
650.0000 mg | ORAL_TABLET | Freq: Once | ORAL | Status: AC
  Administered 2015-03-23: 650 mg via ORAL

## 2015-03-23 MED ORDER — DIPHENHYDRAMINE HCL 25 MG PO CAPS
ORAL_CAPSULE | ORAL | Status: AC
  Filled 2015-03-23: qty 1

## 2015-03-23 MED ORDER — ACETAMINOPHEN 325 MG PO TABS
ORAL_TABLET | ORAL | Status: AC
  Filled 2015-03-23: qty 2

## 2015-03-23 MED ORDER — HEPARIN SOD (PORK) LOCK FLUSH 100 UNIT/ML IV SOLN
500.0000 [IU] | Freq: Once | INTRAVENOUS | Status: AC
  Administered 2015-03-23: 500 [IU] via INTRAVENOUS
  Filled 2015-03-23: qty 5

## 2015-03-23 MED ORDER — TRASTUZUMAB CHEMO INJECTION 440 MG
6.0000 mg/kg | Freq: Once | INTRAVENOUS | Status: AC
  Administered 2015-03-23: 567 mg via INTRAVENOUS
  Filled 2015-03-23: qty 27

## 2015-03-23 MED ORDER — SODIUM CHLORIDE 0.9 % IJ SOLN
10.0000 mL | INTRAMUSCULAR | Status: DC | PRN
  Administered 2015-03-23: 10 mL via INTRAVENOUS
  Filled 2015-03-23: qty 10

## 2015-03-23 MED ORDER — SODIUM CHLORIDE 0.9 % IV SOLN
Freq: Once | INTRAVENOUS | Status: AC
  Administered 2015-03-23: 16:00:00 via INTRAVENOUS

## 2015-03-23 NOTE — Patient Instructions (Signed)
[facility] Discharge Instructions for Patients Receiving Chemotherapy  Today you received the following chemotherapy agents:  Herceptin  To help prevent nausea and vomiting after your treatment, we encourage you to take your nausea medication as prescribed.   If you develop nausea and vomiting that is not controlled by your nausea medication, call the clinic.   BELOW ARE SYMPTOMS THAT SHOULD BE REPORTED IMMEDIATELY:  *FEVER GREATER THAN 100.5 F  *CHILLS WITH OR WITHOUT FEVER  NAUSEA AND VOMITING THAT IS NOT CONTROLLED WITH YOUR NAUSEA MEDICATION  *UNUSUAL SHORTNESS OF BREATH  *UNUSUAL BRUISING OR BLEEDING  TENDERNESS IN MOUTH AND THROAT WITH OR WITHOUT PRESENCE OF ULCERS  *URINARY PROBLEMS  *BOWEL PROBLEMS  UNUSUAL RASH Items with * indicate a potential emergency and should be followed up as soon as possible.  Feel free to call the clinic you have any questions or concerns. The clinic phone number is [phone].  Please show the CHEMO ALERT CARD at check-in to the Emergency Department and triage nurse.   

## 2015-03-23 NOTE — Patient Instructions (Signed)

## 2015-04-06 ENCOUNTER — Other Ambulatory Visit: Payer: BC Managed Care – PPO

## 2015-04-12 ENCOUNTER — Other Ambulatory Visit: Payer: Self-pay | Admitting: *Deleted

## 2015-04-13 ENCOUNTER — Ambulatory Visit (HOSPITAL_BASED_OUTPATIENT_CLINIC_OR_DEPARTMENT_OTHER): Payer: BLUE CROSS/BLUE SHIELD | Admitting: Nurse Practitioner

## 2015-04-13 ENCOUNTER — Encounter: Payer: Self-pay | Admitting: Nurse Practitioner

## 2015-04-13 ENCOUNTER — Ambulatory Visit: Payer: BLUE CROSS/BLUE SHIELD

## 2015-04-13 ENCOUNTER — Other Ambulatory Visit (HOSPITAL_BASED_OUTPATIENT_CLINIC_OR_DEPARTMENT_OTHER): Payer: BLUE CROSS/BLUE SHIELD

## 2015-04-13 ENCOUNTER — Ambulatory Visit (HOSPITAL_BASED_OUTPATIENT_CLINIC_OR_DEPARTMENT_OTHER): Payer: BLUE CROSS/BLUE SHIELD

## 2015-04-13 VITALS — BP 148/66 | HR 66 | Temp 98.0°F | Resp 18 | Wt 202.2 lb

## 2015-04-13 DIAGNOSIS — E559 Vitamin D deficiency, unspecified: Secondary | ICD-10-CM

## 2015-04-13 DIAGNOSIS — C50411 Malignant neoplasm of upper-outer quadrant of right female breast: Secondary | ICD-10-CM

## 2015-04-13 DIAGNOSIS — Z5112 Encounter for antineoplastic immunotherapy: Secondary | ICD-10-CM

## 2015-04-13 DIAGNOSIS — C50911 Malignant neoplasm of unspecified site of right female breast: Secondary | ICD-10-CM

## 2015-04-13 DIAGNOSIS — G4700 Insomnia, unspecified: Secondary | ICD-10-CM

## 2015-04-13 DIAGNOSIS — Z95828 Presence of other vascular implants and grafts: Secondary | ICD-10-CM

## 2015-04-13 HISTORY — DX: Vitamin D deficiency, unspecified: E55.9

## 2015-04-13 LAB — COMPREHENSIVE METABOLIC PANEL (CC13)
ALT: 18 U/L (ref 0–55)
ANION GAP: 13 meq/L — AB (ref 3–11)
AST: 18 U/L (ref 5–34)
Albumin: 3.7 g/dL (ref 3.5–5.0)
Alkaline Phosphatase: 63 U/L (ref 40–150)
BILIRUBIN TOTAL: 0.2 mg/dL (ref 0.20–1.20)
BUN: 11 mg/dL (ref 7.0–26.0)
CALCIUM: 9.3 mg/dL (ref 8.4–10.4)
CHLORIDE: 109 meq/L (ref 98–109)
CO2: 24 meq/L (ref 22–29)
Creatinine: 0.7 mg/dL (ref 0.6–1.1)
Glucose: 136 mg/dl (ref 70–140)
Potassium: 3.8 mEq/L (ref 3.5–5.1)
Sodium: 146 mEq/L — ABNORMAL HIGH (ref 136–145)
Total Protein: 6.7 g/dL (ref 6.4–8.3)

## 2015-04-13 LAB — CBC WITH DIFFERENTIAL/PLATELET
BASO%: 0.8 % (ref 0.0–2.0)
BASOS ABS: 0 10*3/uL (ref 0.0–0.1)
EOS ABS: 0 10*3/uL (ref 0.0–0.5)
EOS%: 0.1 % (ref 0.0–7.0)
HCT: 35.6 % (ref 34.8–46.6)
HEMOGLOBIN: 11.4 g/dL — AB (ref 11.6–15.9)
LYMPH#: 1.2 10*3/uL (ref 0.9–3.3)
LYMPH%: 23.6 % (ref 14.0–49.7)
MCH: 26.4 pg (ref 25.1–34.0)
MCHC: 31.9 g/dL (ref 31.5–36.0)
MCV: 82.7 fL (ref 79.5–101.0)
MONO#: 0.4 10*3/uL (ref 0.1–0.9)
MONO%: 8.6 % (ref 0.0–14.0)
NEUT%: 66.9 % (ref 38.4–76.8)
NEUTROS ABS: 3.3 10*3/uL (ref 1.5–6.5)
Platelets: 211 10*3/uL (ref 145–400)
RBC: 4.31 10*6/uL (ref 3.70–5.45)
RDW: 14.5 % (ref 11.2–14.5)
WBC: 5 10*3/uL (ref 3.9–10.3)

## 2015-04-13 MED ORDER — TRASTUZUMAB CHEMO INJECTION 440 MG
6.0000 mg/kg | Freq: Once | INTRAVENOUS | Status: AC
  Administered 2015-04-13: 567 mg via INTRAVENOUS
  Filled 2015-04-13: qty 27

## 2015-04-13 MED ORDER — HEPARIN SOD (PORK) LOCK FLUSH 100 UNIT/ML IV SOLN
500.0000 [IU] | Freq: Once | INTRAVENOUS | Status: AC | PRN
  Administered 2015-04-13: 500 [IU]
  Filled 2015-04-13: qty 5

## 2015-04-13 MED ORDER — DIPHENHYDRAMINE HCL 25 MG PO CAPS
ORAL_CAPSULE | ORAL | Status: AC
  Filled 2015-04-13: qty 1

## 2015-04-13 MED ORDER — SODIUM CHLORIDE 0.9 % IJ SOLN
10.0000 mL | INTRAMUSCULAR | Status: DC | PRN
  Filled 2015-04-13: qty 10

## 2015-04-13 MED ORDER — SODIUM CHLORIDE 0.9 % IJ SOLN
10.0000 mL | INTRAMUSCULAR | Status: DC | PRN
  Administered 2015-04-13: 10 mL
  Filled 2015-04-13: qty 10

## 2015-04-13 MED ORDER — DIPHENHYDRAMINE HCL 25 MG PO CAPS
25.0000 mg | ORAL_CAPSULE | Freq: Once | ORAL | Status: AC
  Administered 2015-04-13: 25 mg via ORAL

## 2015-04-13 MED ORDER — ACETAMINOPHEN 325 MG PO TABS
650.0000 mg | ORAL_TABLET | Freq: Once | ORAL | Status: AC
  Administered 2015-04-13: 650 mg via ORAL

## 2015-04-13 MED ORDER — ACETAMINOPHEN 325 MG PO TABS
ORAL_TABLET | ORAL | Status: AC
  Filled 2015-04-13: qty 2

## 2015-04-13 MED ORDER — SODIUM CHLORIDE 0.9 % IV SOLN
Freq: Once | INTRAVENOUS | Status: AC
  Administered 2015-04-13: 14:00:00 via INTRAVENOUS

## 2015-04-13 NOTE — Patient Instructions (Signed)

## 2015-04-13 NOTE — Progress Notes (Signed)
ID: Carney Bern OB: 05-18-1951  MR#: [id number]  QKT#:[id number]  PCP: Billee Cashing, MD GYN:   SU: Dr. Harriette Bouillon OTHER MD:  CHIEF COMPLAINT:  Right-sided breast cancer  CURRENT THERAPY: Trastuzumab, and anastrozole  BREAST CANCER HISTORY: From the original consult note:  "[name] is a 64 y.o. female. Patient palpated a right breast mass. She had a mammogram performed and she was noted to have a mass in the upper outer quadrant measuring 2.1 x 2.0 x 1.7 cm. 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 Tumor was ER positive PR positive HER-2/neu positive (triple positive) with a proliferation marker Ki-67 at 90%. She had MRI of the breasts performed which showed a solitary mass in the upper outer quadrant measuring 2.5 cm. Her case was discussed at the multidisciplinary breast conference."  Her subsequent treatment is as detailed below.   INTERVAL HISTORY:  Brandy Jackson returns today for follow up of her breast cancer, accompanied by her husband. She is due for her last dose of trastuzumab today. She was to restart anastrozole after our talk at her last visit, but she never did because she was "too scared." To review, 1 week after starting anastrozole, she complained of insomnia and one major mood swing where she yelled at her husband. She has had no mood swings since this incident, but insomnia is a long standing issue of hers, even before her cancer diagnosis.  REVIEW OF SYSTEMS:  [name] denies fevers, chills, nausea or vomiting. She had some diarrhea yesterday and the day before, but none today. She never took any imodium. She has no shortness of breath, chest pain, cough, or palpitations. She has a history of arthritis and this has been no worse lately. She denies headaches, dizziness, weakness, or vision changes. A detailed review of systems is otherwise stable.   PAST MEDICAL HISTORY: Past Medical History  Diagnosis Date  . Diabetes  mellitus without complication   . Arthritis   . Hot flashes   . Cancer   . Breast cancer   . Wears glasses   . Wears partial dentures     bottom  . Radiation 10/18/14-12/07/14    Invasive ductal carcinoma    PAST SURGICAL HISTORY: Past Surgical History  Procedure Laterality Date  . Bladder repair w/ cesarean section    . Colonoscopy    . Portacath placement N/A 04/11/2014    Procedure: INSERTION PORT-A-CATH;  Surgeon: Clovis Pu. Cornett, MD;  Location: [facility];  Service: General;  Laterality: N/A;  . Re-excision of breast lumpectomy Right 06/06/2014    Procedure: RE-EXCISION OF BREAST LUMPECTOMY;  Surgeon: Maisie Fus A. Cornett, MD;  Location: [facility];  Service: General;  Laterality: Right;    FAMILY HISTORY No family history on file.  GYNECOLOGIC HISTORY: menarche at age 41, G110 P3, went through menopause at age 74.  No h/o of HRT, no h/o abnormal pap smears, or sexually transmitted infections.  Does have frequent vaginal candida infections due to diabetes that she follows with her gynecologist for.    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 Her husband Casimiro Needle is retired from [employer]. He is a cancer survivor himself. The patient has 3 sons, one of whom is studying to be a pediatrician. All her children are married. She has 6 grandchildren.    ADVANCED DIRECTIVES: not in place.    HEALTH MAINTENANCE: History  Substance Use Topics  . Smoking status: Former Smoker -- 1.00 packs/day    Quit date: 03/08/1986  . Smokeless tobacco: Never Used  . Alcohol Use: No     Colonoscopy: Bone Density Scan: Obtained 12/21/2014 shows a normal T score Pap Smear:  Vitamin D Level:   Lipid Panel:    Allergies  Allergen Reactions  . Sulfa Antibiotics Rash    Current Outpatient Prescriptions  Medication Sig Dispense Refill  . clotrimazole-betamethasone (LOTRISONE) lotion  Apply 1 application topically at bedtime as needed (itching).   0  . lidocaine-prilocaine (EMLA) cream Apply 1 application topically as needed. (Patient taking differently: Apply 1 application topically as needed (chemo). ) 30 g 2  . metFORMIN (GLUCOPHAGE) 500 MG tablet Take 500 mg by mouth 2 (two) times daily with a meal.    . TACLONEX external suspension Apply 1 application topically daily. To scalp for sorosis  0  . trastuzumab (HERCEPTIN) 440 MG injection Inject 440 mg into the vein every 21 ( twenty-one) days.    Marland Kitchen anastrozole (ARIMIDEX) 1 MG tablet Take 1 tablet (1 mg total) by mouth daily. (Patient not taking: Reported on 03/19/2015) 90 tablet 4   No current facility-administered medications for this visit.    OBJECTIVE: Middle-aged [place] American woman who appears stated age 64 Vitals:   04/13/15 1346  BP: 148/66  Pulse: 66  Temp: 98 F (36.7 C)  Resp: 18     Body mass index is 31.66 kg/(m^2).      ECOG FS:1  Skin: warm, dry  HEENT: sclerae anicteric, conjunctivae pink, oropharynx clear. No thrush or mucositis.  Lymph Nodes: No cervical or supraclavicular lymphadenopathy  Lungs: clear to auscultation bilaterally, no rales, wheezes, or rhonci  Heart: regular rate and rhythm  Abdomen: round, soft, non tender, positive bowel sounds  Musculoskeletal: No focal spinal tenderness, no peripheral edema  Neuro: non focal, well oriented, positive affect  Breasts: deferred  LAB RESULTS:  CMP     Component Value Date/Time   NA 146* 04/13/2015 1312   NA 142 04/07/2014 1320   K 3.8 04/13/2015 1312   K 4.2 04/07/2014 1320   CL 103 04/07/2014 1320   CO2 24 04/13/2015 1312   CO2 25 04/07/2014 1320   GLUCOSE 136 04/13/2015 1312   GLUCOSE 126* 04/07/2014 1320   BUN 11.0 04/13/2015 1312   BUN 16 04/07/2014 1320   CREATININE 0.7 04/13/2015 1312   CREATININE 0.56 04/07/2014 1320   CALCIUM 9.3 04/13/2015 1312   CALCIUM 9.4 04/07/2014 1320   PROT 6.7 04/13/2015 1312   PROT 7.8  04/07/2014 1320   ALBUMIN 3.7 04/13/2015 1312   ALBUMIN 3.9 04/07/2014 1320   AST 18 04/13/2015 1312   AST 20 04/07/2014 1320   ALT 18 04/13/2015 1312   ALT 19 04/07/2014 1320   ALKPHOS 63 04/13/2015 1312   ALKPHOS 68 04/07/2014 1320   BILITOT 0.20 04/13/2015 1312   BILITOT 0.2* 04/07/2014 1320   GFRNONAA >90 04/07/2014 1320   GFRAA >90 04/07/2014 1320    I No results found for: SPEP  Lab Results  Component Value Date   WBC 5.0 04/13/2015   NEUTROABS 3.3 04/13/2015   HGB 11.4* 04/13/2015   HCT 35.6 04/13/2015   MCV 82.7 04/13/2015   PLT 211 04/13/2015      Chemistry      Component Value Date/Time   NA 146* 04/13/2015 1312   NA 142 04/07/2014 1320   K 3.8 04/13/2015 1312   K 4.2  04/07/2014 1320   CL 103 04/07/2014 1320   CO2 24 04/13/2015 1312   CO2 25 04/07/2014 1320   BUN 11.0 04/13/2015 1312   BUN 16 04/07/2014 1320   CREATININE 0.7 04/13/2015 1312   CREATININE 0.56 04/07/2014 1320      Component Value Date/Time   CALCIUM 9.3 04/13/2015 1312   CALCIUM 9.4 04/07/2014 1320   ALKPHOS 63 04/13/2015 1312   ALKPHOS 68 04/07/2014 1320   AST 18 04/13/2015 1312   AST 20 04/07/2014 1320   ALT 18 04/13/2015 1312   ALT 19 04/07/2014 1320   BILITOT 0.20 04/13/2015 1312   BILITOT 0.2* 04/07/2014 1320       No results found for: LABCA2  No components found for: LABCA125  No results for input(s): INR in the last 168 hours.  Urinalysis No results found for: COLORURINE  STUDIES: Most recent bone density scan showed a t-score of -0.6 (normal).  ASSESSMENT: 64 y.o. [place],woman status post right lumpectomy and sentinel lymph node sampling 04/11/2014 for a pT2 pN1, stage IIB invasive ductal carcinoma, grade 3, estrogen receptor strongly positive, progesterone receptor negative, with an MIB-1 of 94% and HER-2 amplification  (1) additional surgery in 06/06/2014 for margin clearance showed no evidence of residual malignancy  (2) received the first of six  planned cycles of adjuvant chemotherapy with carboplatin docetaxel and trastuzumab 05/02/2014, with chemotherapy interrupted because of her intervening surgery; chemo resumed 06/23/2014, completing 6 cycles 09/15/2014  (3) continuing trastuzumab completed 04/13/15  (a) most recent echo 03/19/2015 showed a well preserved ejection fraction.  (4) adjuvant radiation completed 12/07/2014 Right breast (high tangents)/ 50.4 Gray @ 1.8 Gray per fraction x 28 fractions Right breast boost / 10 Gray at [employer] per fraction x 5 fractions  (5) anastrozole started 01/19/2015. Stopped after 1 week because of mood changes. To restart 05/09/15  PLAN:  Once again, I stressed the importance of antiestrogen therapy to [facility]. It is unlikely that the singular mood swing she experienced in February would have anything to do with anastrozole. At our last visit we spent a significant amount of time delineating common menopausal side effects from antiestrogen side effects. In addition, we reviewed that she is likely undergoing a fair amount of post traumatic stress. She will give the drug one more try, but wants to wait until the first of June when she is off from work for the summer.   Her vitamin D levels returned deficient at 19, so she will begin a 1000 unit supplement daily. We will recheck this level in 3 months. The CBC and CMET were reviewed in detail and were entirely stable. She will proceed with her last dose of trastuzumab as planned today. She had a repeat echo in early April that was entirely stable. She would like her port out soon, only from interventional radiology instead via surgery, so I have placed orders accordingly.   Cherity will return in 3 months for labs and a follow up visit. She understands and agrees with this plan. She knows the goal of treatment in her case is cure. She has been encouraged to call with any issues that might arise before her next visit here.  Marland Kitchen Laurie Panda, NP 04/13/2015 2:17  PM

## 2015-04-17 ENCOUNTER — Telehealth: Payer: Self-pay | Admitting: *Deleted

## 2015-04-17 NOTE — Telephone Encounter (Signed)
Pt's husband called to find out if it was okay for pt to get port removed. During her last visit NP put the order in and pt was called from the doctor's office to set up the date and time, but she wanted to make sure we didn't need any more scans to be done at the current time. I reassured her that we don't need anything right now and it's okay for her to schedule her port to be removed. Message to be forwarded to Gentry Fitz, NP.

## 2015-04-30 ENCOUNTER — Other Ambulatory Visit: Payer: Self-pay | Admitting: Physician Assistant

## 2015-04-30 ENCOUNTER — Other Ambulatory Visit: Payer: Self-pay | Admitting: Radiology

## 2015-05-01 ENCOUNTER — Ambulatory Visit (HOSPITAL_COMMUNITY): Admission: RE | Admit: 2015-05-01 | Payer: BLUE CROSS/BLUE SHIELD | Source: Ambulatory Visit

## 2015-05-01 ENCOUNTER — Ambulatory Visit (HOSPITAL_COMMUNITY)
Admission: RE | Admit: 2015-05-01 | Discharge: 2015-05-01 | Disposition: A | Discharge status: Home / Self Care | Payer: BLUE CROSS/BLUE SHIELD | Source: Ambulatory Visit | Attending: Nurse Practitioner | Admitting: Nurse Practitioner

## 2015-05-01 ENCOUNTER — Encounter (HOSPITAL_COMMUNITY): Payer: Self-pay

## 2015-05-01 DIAGNOSIS — C50919 Malignant neoplasm of unspecified site of unspecified female breast: Secondary | ICD-10-CM | POA: Diagnosis not present

## 2015-05-01 DIAGNOSIS — C50411 Malignant neoplasm of upper-outer quadrant of right female breast: Secondary | ICD-10-CM

## 2015-05-01 LAB — GLUCOSE, CAPILLARY: Glucose-Capillary: 109 mg/dL — ABNORMAL HIGH (ref 65–99)

## 2015-05-01 MED ORDER — LIDOCAINE HCL 1 % IJ SOLN
INTRAMUSCULAR | Status: AC
  Filled 2015-05-01: qty 20

## 2015-05-01 MED ORDER — SODIUM CHLORIDE 0.9 % IV SOLN
INTRAVENOUS | Status: DC
  Administered 2015-05-01: 10:00:00 via INTRAVENOUS

## 2015-05-01 MED ORDER — CEFAZOLIN SODIUM-DEXTROSE 2-3 GM-% IV SOLR
2.0000 g | INTRAVENOUS | Status: AC
  Administered 2015-05-01: 2 g via INTRAVENOUS

## 2015-05-01 MED ORDER — CEFAZOLIN SODIUM-DEXTROSE 2-3 GM-% IV SOLR
INTRAVENOUS | Status: AC
  Administered 2015-05-01: 2 g via INTRAVENOUS
  Filled 2015-05-01: qty 50

## 2015-05-01 NOTE — Procedures (Signed)
Procedure:  Removal of port Findings:  Entire right IJ single lumen port and catheter removed.  No complications.  Venetia Night. Kathlene Cote, M.D. Pager:  [phone]

## 2015-05-01 NOTE — Progress Notes (Signed)
Patient does not want sedation for port removal. Checked with Dr. Kathlene Cote he stated did not need to have any labs drawn as no sedation. Only IV start needed for procedure.

## 2015-05-01 NOTE — Discharge Instructions (Signed)
Leave bandage on for 24 hours. May remove tomorrow and shower. Let skin glue peel off. Call MD for any redness, drainage, swelling, or fever. Call for increased pain.

## 2015-05-04 ENCOUNTER — Other Ambulatory Visit: Payer: BLUE CROSS/BLUE SHIELD

## 2015-05-09 ENCOUNTER — Telehealth: Payer: Self-pay | Admitting: *Deleted

## 2015-05-09 NOTE — Telephone Encounter (Signed)
Called to remind pt to start her anastrozole today. No answer but left a detailed message on VM about starting the anastrozole today (June 1 ), this was the agreed upon date. I also told her the importance of starting this medication on time so we can be able to monitor her tolerance as she returns to see Dr. Jana Hakim in August. Message to be forwarded to Gentry Fitz, NP.

## 2015-05-14 ENCOUNTER — Telehealth: Payer: Self-pay | Admitting: *Deleted

## 2015-05-14 NOTE — Telephone Encounter (Signed)
Pt's husband called concerning the phone call I left on June 1st reminding the pt to start her anastrozole. Husband said his wife(the patient) is now requesting if she can start after June 10th since school will be ending for the year in case she would have any side effects like she experienced first time she took this medication. Message to be forwarded to Gentry Fitz, NP for review.

## 2015-05-18 ENCOUNTER — Telehealth: Payer: Self-pay | Admitting: *Deleted

## 2015-05-18 NOTE — Telephone Encounter (Signed)
Called pt to f/u to make sure she will start the anastrazole today. Pt was a little hesitant at first but did agree to start this evening. I will call pt next week to see how she is tolerating medication. Message to be forwarded to Gentry Fitz, NP.

## 2015-05-25 ENCOUNTER — Telehealth: Payer: Self-pay | Admitting: *Deleted

## 2015-05-25 NOTE — Telephone Encounter (Signed)
Called to f/u with pt to make sure everything is going well with her since starting back on the anastrazole. No answer but left a detailed message on VM for pt to call this nurse back @ [phone]. Message to be forwarded to Gentry Fitz, NP.

## 2015-05-28 ENCOUNTER — Telehealth: Payer: Self-pay

## 2015-05-28 ENCOUNTER — Other Ambulatory Visit: Payer: Self-pay | Admitting: Oncology

## 2015-05-28 DIAGNOSIS — C50411 Malignant neoplasm of upper-outer quadrant of right female breast: Secondary | ICD-10-CM

## 2015-05-28 MED ORDER — TAMOXIFEN CITRATE 20 MG PO TABS: 20.0000 mg | ORAL_TABLET | Freq: Every day | ORAL | Status: DC

## 2015-05-28 NOTE — Telephone Encounter (Signed)
Fine to switch to tamoxifen, please call in #90, 1 po QD, refills 1 yr  Thank you!

## 2015-05-28 NOTE — Telephone Encounter (Signed)
Returning pt call re: change in medication and change in prescription. SE from anastrazole. Severe depression, mood swings, joint pain, and coughing in AM. Went to [place]. [facility]. Can she try tamoxifen? Pt uses [facility] on [address]. Next appt Aug 23.

## 2015-05-28 NOTE — Telephone Encounter (Addendum)
E-scribed tamoxifen to [facility] per Dr Virgie Dad message. lvm on pt home phone.

## 2015-05-28 NOTE — Addendum Note (Signed)
Addended by: Janace Hoard on: 05/28/2015 01:06 PM   Modules accepted: Orders

## 2015-05-29 ENCOUNTER — Other Ambulatory Visit: Payer: Self-pay | Admitting: Nurse Practitioner

## 2015-07-04 ENCOUNTER — Telehealth: Payer: Self-pay | Admitting: *Deleted

## 2015-07-04 NOTE — Telephone Encounter (Signed)
Returned call to pt to inform her to stop the anastrozole, she didn't take it today. Last day taken was 07/03/15. Advised pt to let all joint pain issues resolved whether that be a week or 2 weeks. After all pain is gone then she can start Tamoxifen. Pt still has her script for this medication to be filled. I also told pt I will be calling her the end of next week to see if all pain issues have resolved if they haven't I will call again the end of the 2nd week. Pt agrees to the plan. No further questions or concerns. Message to be fwd to Gentry Fitz, NP.

## 2015-07-04 NOTE — Telephone Encounter (Signed)
Pt called with concerns about what she thinks is side effects from taking the anastrozole. Pt has been experiencing severe joint pain. She has a hx of arthritis as well. She also said her thumb is swollen and both feet are numb and painful. Pt said she did have to take Ibuprofen after her walk yesterday b/c she was in so much pain. Pt did not take anastrozole this am and  wanted to know what direction she should go in concerning this med or should she switch to tamoxifen. I told pt I will talk to Towana Badger about this and get back in touch with her. Message to be fwd to Gentry Fitz, NP.

## 2015-07-09 IMAGING — XA IR CENTRAL VENOUS CATHETER
3 series · 7 of 7 positions shown · non-contrast
Comparison: 08/18/2014

CLINICAL DATA: Breast cancer, receiving chemotherapy, port catheter
malfunction

EXAM:
CENTRAL VENOUS CATHETER

[Series 2: care single · 1 of 1 slices shown]
[im 1/1]
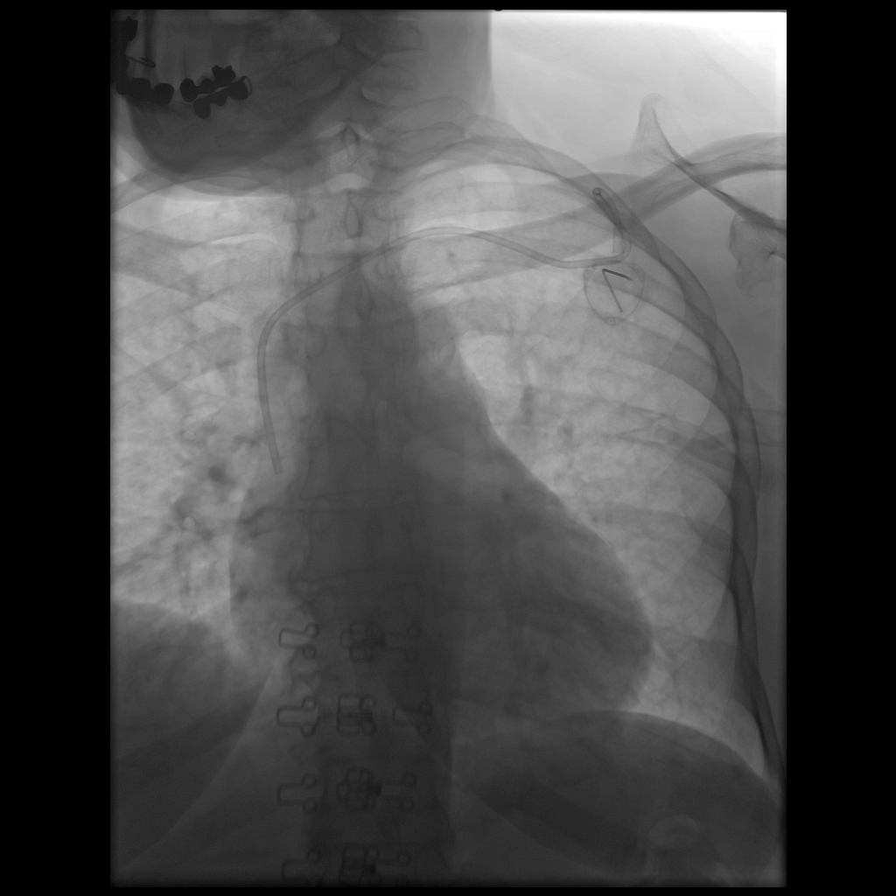

[Series 4: care body 4 · 4 of 17 frames shown]
[frame 3/17]
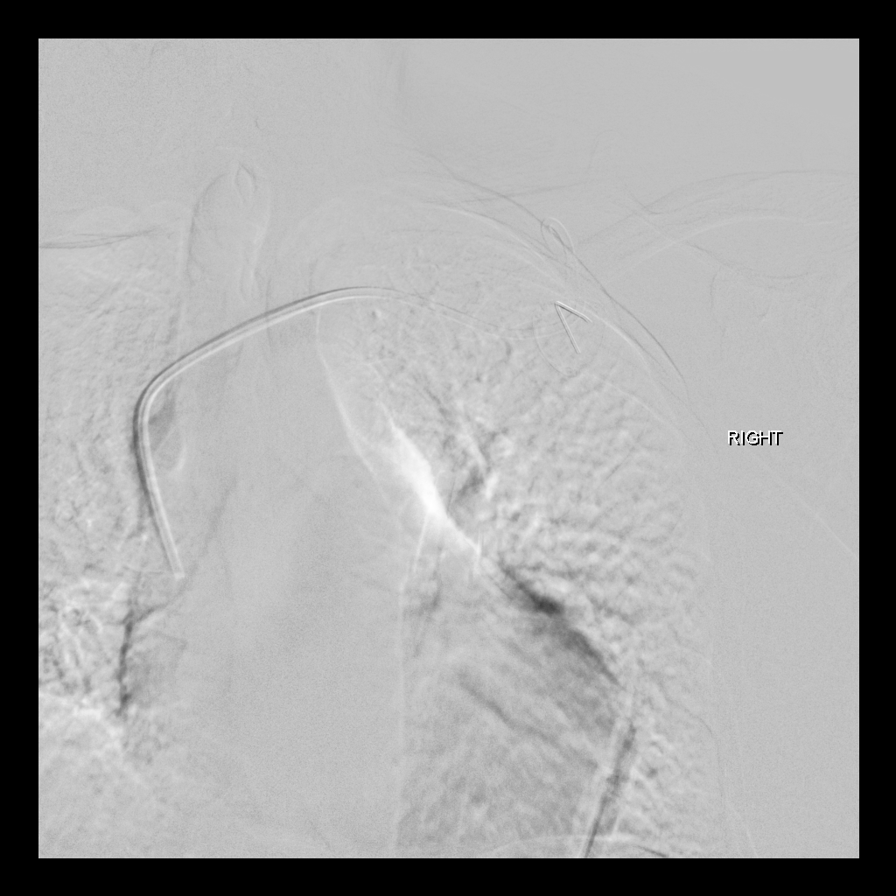
[frame 9/17]
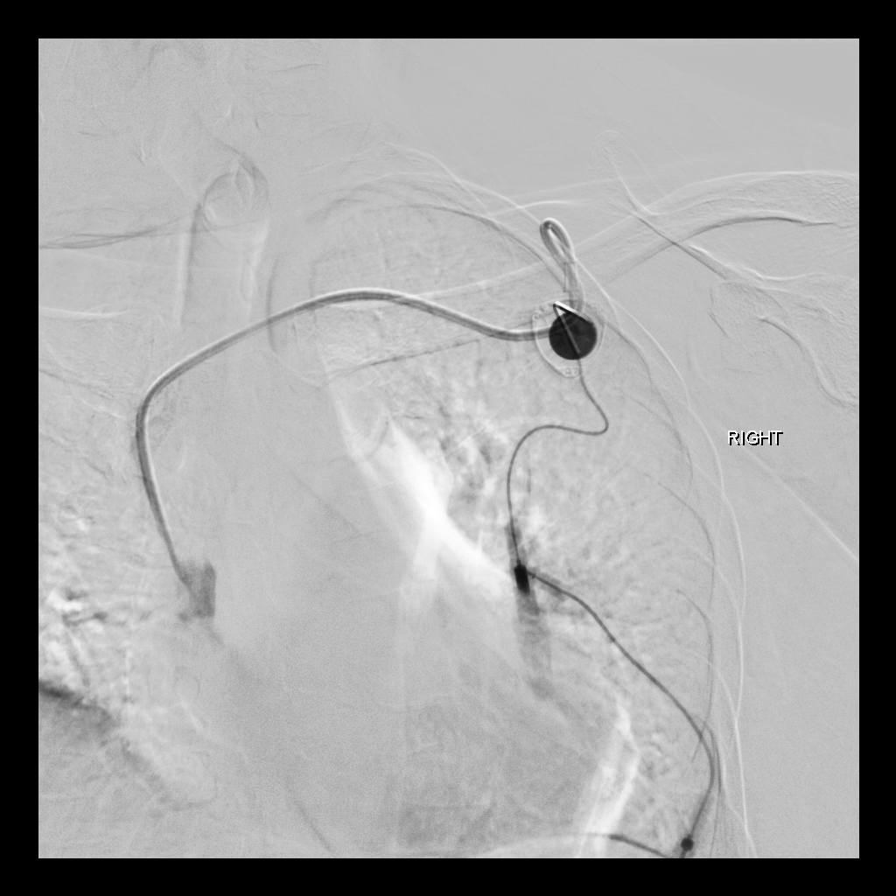
[frame 15/17]
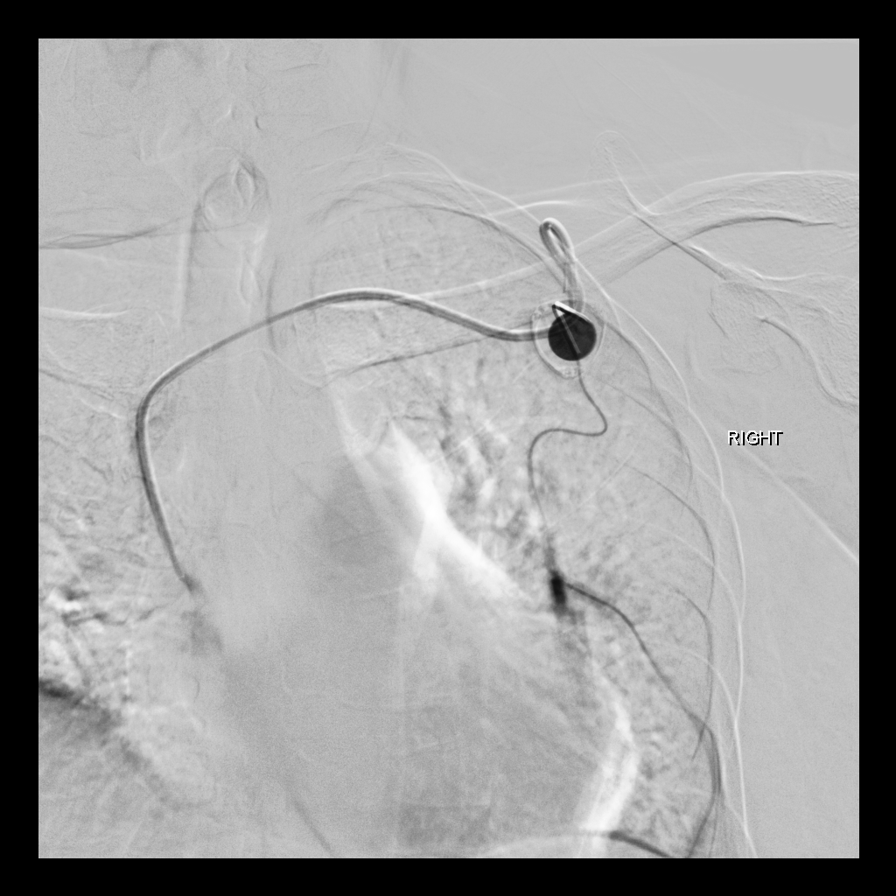
[frame 16/17]
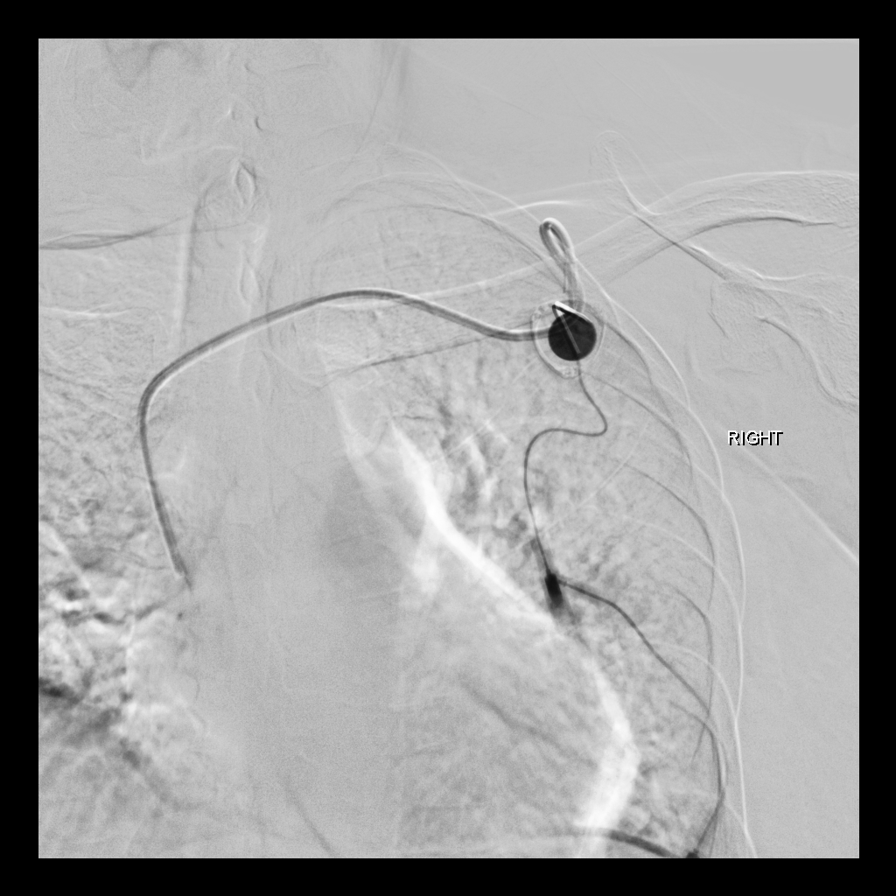

[Series 300: ir fluoro guide cv line*r* · 2 of 2 slices shown]
[im 1/2]
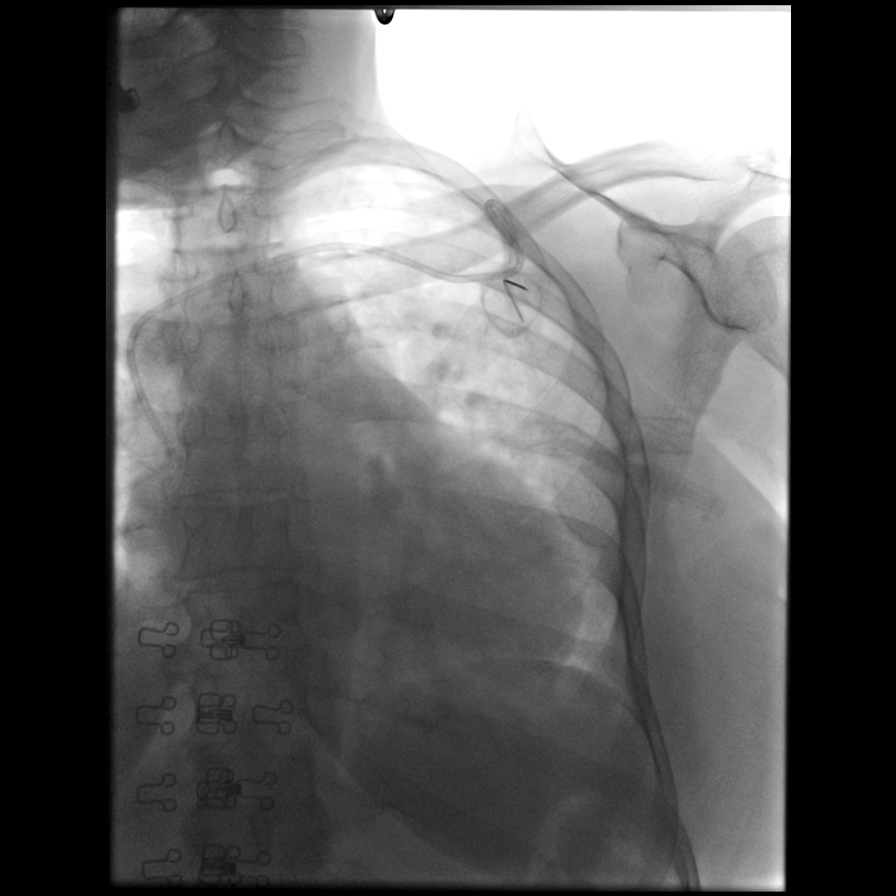
[im 2/2]
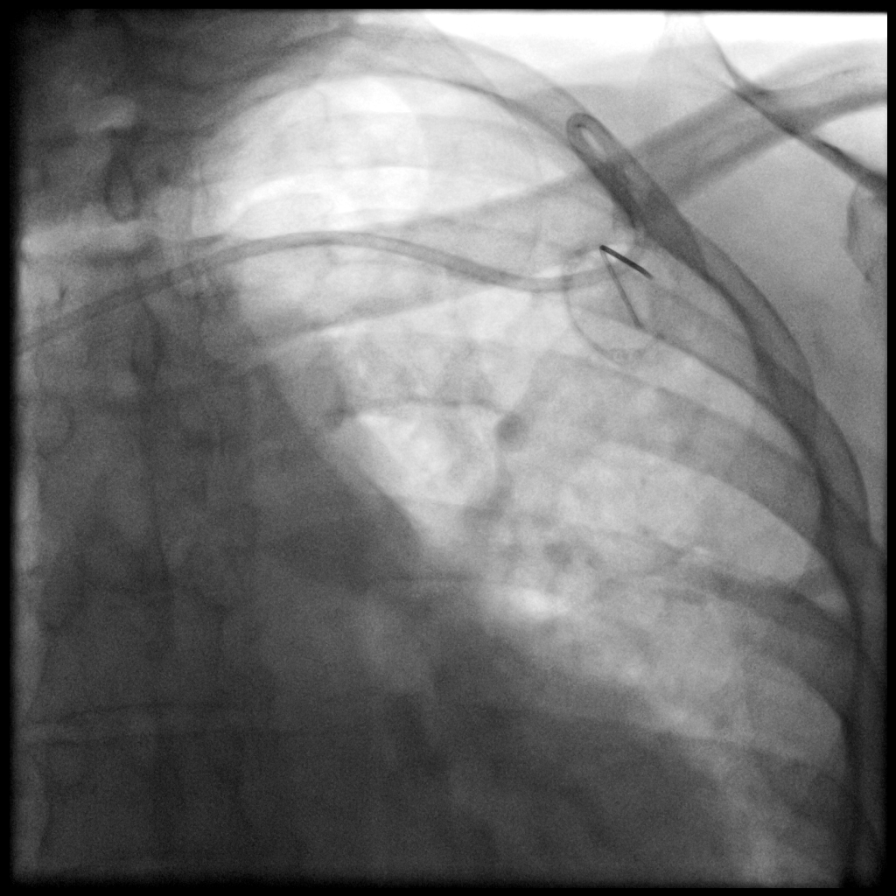

[7 of 7 positions shown; findings below may reference images not displayed]

FINDINGS: Under sterile conditions, the existing left chest port catheter was
injected with contrast. Fluoroscopic imaging performed. Similar kink
of the port catheter tubing in the mid clavicle soft tissue region.
Port catheter remains patent with the tip in the lower SVC. Lumen
remains patent. No significant thrombus or fiber sheath.
IMPRESSION: Stable appearance of the port catheter with a kink in the mid
clavicle soft tissues. Tubing remains patent. Tip in the lower SVC.
Okay to use.

## 2015-07-12 ENCOUNTER — Telehealth: Payer: Self-pay | Admitting: *Deleted

## 2015-07-12 NOTE — Telephone Encounter (Signed)
Returned pt's call concerning request for pain  medication . Pt called and left a message that she is having pain and stiffness from  the anastrozole that she was on. Pt discontinued anastrozole on 07/04/15. No answer but left a detailed message for pt to call this nurse back @ [phone]. Message to be fwd to [employer].

## 2015-07-13 ENCOUNTER — Telehealth: Payer: Self-pay | Admitting: *Deleted

## 2015-07-13 NOTE — Telephone Encounter (Signed)
Pt returned my phone call from earlier. She was in agreement with seeing her primary care doctor to rule out whether she has a pilonidal cyst. Pt said as far as her stiffness goes , she will continue to move more often to prevent stiffness from becoming worse. I asked her if she has been able to walk lately with the stiffness. Pt said she tries to walk 30-45 minutes on the treadmill daily. Pt mentioned she will have a mammogram done at [facility] on Aug 11. I asked her to have a copy sent to Dr. Jana Hakim. She will continue to take extra strength Tylenol as well and will see Dr. Jana Hakim on Jul 31, 2015. Message to be fwd to [employer]

## 2015-07-13 NOTE — Telephone Encounter (Signed)
Pt called earlier with complaints of joint pain and stiffness and swelling to the left thumb which has been ongoing for almost 2 weeks. Pt is no longer on the anastrozole which she thinks caused these issues. This am she had a new problem, pt thinks she may have a Pilonidal cyst. She is having pain above the sacral area when she sits down. She has had this many years ago. Pt has been taking extra strength Tylenol and hot showers which she says is giving her some relief. I advised pt to see her primary doctor or if pain becomes intolerable to go to the nearest ED. Pt will see Dr. Jana Hakim on Aug 23rd. Message to be fwd to Gentry Fitz, NP.

## 2015-07-31 ENCOUNTER — Telehealth: Payer: Self-pay | Admitting: Oncology

## 2015-07-31 ENCOUNTER — Ambulatory Visit (HOSPITAL_BASED_OUTPATIENT_CLINIC_OR_DEPARTMENT_OTHER): Payer: BLUE CROSS/BLUE SHIELD | Admitting: Oncology

## 2015-07-31 VITALS — BP 132/73 | HR 76 | Temp 98.6°F | Resp 18 | Ht 67.0 in | Wt 207.3 lb

## 2015-07-31 DIAGNOSIS — Z5111 Encounter for antineoplastic chemotherapy: Secondary | ICD-10-CM

## 2015-07-31 DIAGNOSIS — M545 Low back pain: Secondary | ICD-10-CM

## 2015-07-31 DIAGNOSIS — C50411 Malignant neoplasm of upper-outer quadrant of right female breast: Secondary | ICD-10-CM | POA: Diagnosis not present

## 2015-07-31 DIAGNOSIS — L4050 Arthropathic psoriasis, unspecified: Secondary | ICD-10-CM

## 2015-07-31 HISTORY — DX: Arthropathic psoriasis, unspecified: L40.50

## 2015-07-31 MED ORDER — TRAMADOL HCL 50 MG PO TABS: 50.0000 mg | ORAL_TABLET | Freq: Four times a day (QID) | ORAL | Status: DC | PRN

## 2015-07-31 NOTE — Telephone Encounter (Signed)
Gave patient avs report and appointments for November. Central radiology schedulers will contact patient re ct-scan once pre-auth has been obtained - patient aware.

## 2015-07-31 NOTE — Progress Notes (Signed)
ID: [name] OB: July 18, 1951  MR#: [id number]  KGS#:[id number]  PCP: Ricke Hey, MD GYN:   SU: Dr. Erroll Luna OTHER MD:  CHIEF COMPLAINT:  Right-sided breast cancer  CURRENT THERAPY: Tamoxifen  BREAST CANCER HISTORY: From the original consult note:  "Brandy Jackson is a 64 y.o. female. Patient palpated a right breast mass. She had a mammogram performed and she was noted to have a mass in the upper outer quadrant measuring 2.1 x 2.0 x 1.7 cm. 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 Tumor was ER positive PR positive HER-2/neu positive (triple positive) with a proliferation marker Ki-67 at 90%. She had MRI of the breasts performed which showed a solitary mass in the upper outer quadrant measuring 2.5 cm. Her case was discussed at the multidisciplinary breast conference."  Her subsequent treatment is as detailed below.   INTERVAL HISTORY:  Brandy Jackson returns today for follow up of her breast cancer she had been on anastrozole for about 2 months before she had to discontinue the medication. She had overwhelming arthralgias and myalgias. She does have a history of psoriatic arthritis, which she had not mentioned before. She says this felt just like that. After going off the medication the aches and pains have improved. We had prescribed tamoxifen for her but she has not started it yet.  REVIEW OF SYSTEMS:  Brandy Jackson  Presented to urgent care with back pain and plain films were obtained about a month ago which she tells me were completely normal. Nevertheless she is concerned that there his persistent pain in her midthoracic and lower lumbar spine areas. She wonders if she could be having hidden cancer in that area. At the [facility] they started her on diclofenac. This is during upper stomach, she says. She is having problems with hemorrhoids. A detailed review of systems today was otherwise stable  PAST MEDICAL HISTORY: Past Medical History   Diagnosis Date  . Diabetes mellitus without complication   . Arthritis   . Hot flashes   . Cancer   . Breast cancer   . Wears glasses   . Wears partial dentures     bottom  . Radiation 10/18/14-12/07/14    Invasive ductal carcinoma    PAST SURGICAL HISTORY: Past Surgical History  Procedure Laterality Date  . Bladder repair w/ cesarean section    . Colonoscopy    . Portacath placement N/A 04/11/2014    Procedure: INSERTION PORT-A-CATH;  Surgeon: Joyice Faster. Cornett, MD;  Location: [place];  Service: General;  Laterality: N/A;  . Re-excision of breast lumpectomy Right 06/06/2014    Procedure: RE-EXCISION OF BREAST LUMPECTOMY;  Surgeon: Marcello Moores A. Cornett, MD;  Location: [place];  Service: General;  Laterality: Right;    FAMILY HISTORY No family history on file.  GYNECOLOGIC HISTORY: menarche at age 41, G17 P3, went through menopause at age 61.  No h/o of HRT, no h/o abnormal pap smears, or sexually transmitted infections.  Does have frequent vaginal candida infections due to diabetes that she follows with her gynecologist for.    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 Her husband Brandy Jackson is retired from [employer]. He is a cancer survivor himself. The patient has 3 sons, one of whom was studying to be a pediatrician But now has switched to health management. All her children are married. She has 6 grandchildren.  ADVANCED DIRECTIVES: not in place.    HEALTH MAINTENANCE: Social History  Substance Use Topics  . Smoking status: Former Smoker -- 1.00 packs/day    Quit date: 03/08/1986  . Smokeless tobacco: Never Used  . Alcohol Use: No     Colonoscopy: Bone Density Scan: Obtained 12/21/2014 shows a normal T score Pap Smear:  Lipid Panel:    Allergies  Allergen Reactions  . Sulfa Antibiotics Rash    Current Outpatient Prescriptions  Medication Sig Dispense  Refill  . anastrozole (ARIMIDEX) 1 MG tablet Take 1 tablet (1 mg total) by mouth daily. 90 tablet 4  . calcipotriene (DOVONOX) 0.005 % cream Apply 1 application topically 2 (two) times daily.    . cholecalciferol (VITAMIN D) 1000 UNITS tablet Take 1,000 Units by mouth daily.    . clobetasol (TEMOVATE) 0.05 % external solution Apply 1 application topically 2 (two) times daily.   0  . metFORMIN (GLUCOPHAGE) 500 MG tablet Take 500 mg by mouth 2 (two) times daily with a meal.    . naproxen sodium (ANAPROX) 220 MG tablet Take 440 mg by mouth daily.    . Omega 3 340 MG CPDR Take 340 mg by mouth daily.    . tamoxifen (NOLVADEX) 20 MG tablet Take 1 tablet (20 mg total) by mouth daily. 90 tablet 3  . triamcinolone cream (KENALOG) 0.1 % Apply 1 application topically 2 (two) times daily.   0   No current facility-administered medications for this visit.    OBJECTIVE: Middle-aged [place] American woman  In no acute distress Filed Vitals:   07/31/15 1316  BP: 132/73  Pulse: 76  Temp: 98.6 F (37 C)  Resp: 18     Body mass index is 32.46 kg/(m^2).      ECOG FS:1  Sclerae unicteric, EOMs intact Oropharynx clear and moist No cervical or supraclavicular adenopathy Lungs no rales or rhonchi Heart regular rate and rhythm Abd soft, nontender, positive bowel sounds MSK no focal spinal tenderness, no upper extremity lymphedema Neuro: nonfocal, well oriented, appropriate affect Breasts:  The right breast is status post lumpectomy and radiation. There is no evidence of local recurrence. The right axilla is benign. The left breast is unremarkable.   LAB RESULTS:  CMP     Component Value Date/Time   NA 146* 04/13/2015 1312   NA 142 04/07/2014 1320   K 3.8 04/13/2015 1312   K 4.2 04/07/2014 1320   CL 103 04/07/2014 1320   CO2 24 04/13/2015 1312   CO2 25 04/07/2014 1320   GLUCOSE 136 04/13/2015 1312   GLUCOSE 126* 04/07/2014 1320   BUN 11.0 04/13/2015 1312   BUN 16 04/07/2014 1320    CREATININE 0.7 04/13/2015 1312   CREATININE 0.56 04/07/2014 1320   CALCIUM 9.3 04/13/2015 1312   CALCIUM 9.4 04/07/2014 1320   PROT 6.7 04/13/2015 1312   PROT 7.8 04/07/2014 1320   ALBUMIN 3.7 04/13/2015 1312   ALBUMIN 3.9 04/07/2014 1320   AST 18 04/13/2015 1312   AST 20 04/07/2014 1320   ALT 18 04/13/2015 1312   ALT 19 04/07/2014 1320   ALKPHOS 63 04/13/2015 1312   ALKPHOS 68 04/07/2014 1320   BILITOT 0.20 04/13/2015 1312   BILITOT 0.2* 04/07/2014 1320   GFRNONAA >90 04/07/2014 1320   GFRAA >90 04/07/2014 1320    I No results found for: SPEP  Lab Results  Component Value Date   WBC 5.0 04/13/2015   NEUTROABS 3.3 04/13/2015   HGB 11.4* 04/13/2015  HCT 35.6 04/13/2015   MCV 82.7 04/13/2015   PLT 211 04/13/2015      Chemistry      Component Value Date/Time   NA 146* 04/13/2015 1312   NA 142 04/07/2014 1320   K 3.8 04/13/2015 1312   K 4.2 04/07/2014 1320   CL 103 04/07/2014 1320   CO2 24 04/13/2015 1312   CO2 25 04/07/2014 1320   BUN 11.0 04/13/2015 1312   BUN 16 04/07/2014 1320   CREATININE 0.7 04/13/2015 1312   CREATININE 0.56 04/07/2014 1320      Component Value Date/Time   CALCIUM 9.3 04/13/2015 1312   CALCIUM 9.4 04/07/2014 1320   ALKPHOS 63 04/13/2015 1312   ALKPHOS 68 04/07/2014 1320   AST 18 04/13/2015 1312   AST 20 04/07/2014 1320   ALT 18 04/13/2015 1312   ALT 19 04/07/2014 1320   BILITOT 0.20 04/13/2015 1312   BILITOT 0.2* 04/07/2014 1320       No results found for: LABCA2  No components found for: LABCA125  No results for input(s): INR in the last 168 hours.  Urinalysis No results found for: COLORURINE  STUDIES: No results found.   ASSESSMENT: 64 y.o. [place],woman status post right lumpectomy and sentinel lymph node sampling 04/11/2014 for a pT2 pN1, stage IIB invasive ductal carcinoma, grade 3, estrogen receptor strongly positive, progesterone receptor negative, with an MIB-1 of 94% and HER-2 amplification  (1)  additional surgery in 06/06/2014 for margin clearance showed no evidence of residual malignancy  (2) received the first of six planned cycles of adjuvant chemotherapy with carboplatin docetaxel and trastuzumab 05/02/2014, with chemotherapy interrupted because of her intervening surgery; chemo resumed 06/23/2014, completing 6 cycles 09/15/2014  (3) a year of trastuzumab completed 04/13/15  (a) most recent echo 03/19/2015 showed a well preserved ejection fraction.  (4) adjuvant radiation completed 12/07/2014 Right breast (high tangents)/ 50.4 Gray @ 1.8 Gray per fraction x 28 fractions Right breast boost / 10 Gray at [employer] per fraction x 5 fractions  (5) anastrozole started 01/19/2015. Stopped after 1 week because of mood changes.  Restarted 05/09/2015, discontinued after 7 weeks with recurrent arthralgias myalgias   (6) starting tamoxifen 07/31/2015  PLAN:   Tynlee remains very concerned about the pain in her back. I have schedule her for noncontrast CT scans of the thoracic and lumbar spines. However she is very concerned about cost and if it turns out that they will be excessive out-of-pocket costs she will plan to cancel those studies. It is reassuring that plain films obtained elsewhere (we do not have copies) per her report were unremarkable    she is having some problems from the diclofenac. I wrote her a prescription for tramadol to take up to 4 times a day as needed. Hopefully that will be a little easier on her stomach. It may however cause her to be constipated. She will let us know if she has any problems from that medication.  She is now ready to give tamoxifen a try. We reviewed the possible toxicities, side effects and complications of this agent. She is going to see as again in 3 months. If she tolerates tamoxifen well we will start seeing her on an every 6 month basis from that visit.  I am delighted that her son is feeling better. He has changed careers and apparently finds a new  1 much less stressful   She knows to call for any problems that may develop before her next visit here.  Chauncey Cruel, MD 07/31/2015 1:28 PM

## 2015-08-06 ENCOUNTER — Telehealth (HOSPITAL_COMMUNITY): Payer: Self-pay | Admitting: Radiology

## 2015-08-06 NOTE — Telephone Encounter (Signed)
Mailed pt a letter to call centralized scheduling to schedule CT scans.  Called 3x with no response.  Sent in-basket message to Dr. Jana Hakim and Audie Clear.

## 2015-08-14 ENCOUNTER — Ambulatory Visit (HOSPITAL_COMMUNITY): Payer: BLUE CROSS/BLUE SHIELD

## 2015-09-19 IMAGING — CR DG CHEST 2V
2 series · 2 of 2 positions shown · non-contrast
Comparison: 04/11/2014

CLINICAL DATA: Initial evaluation for shortness of breath and
cough, former smoker, current history of breast cancer

EXAM:
CHEST  2 VIEW

[w chest pa *]
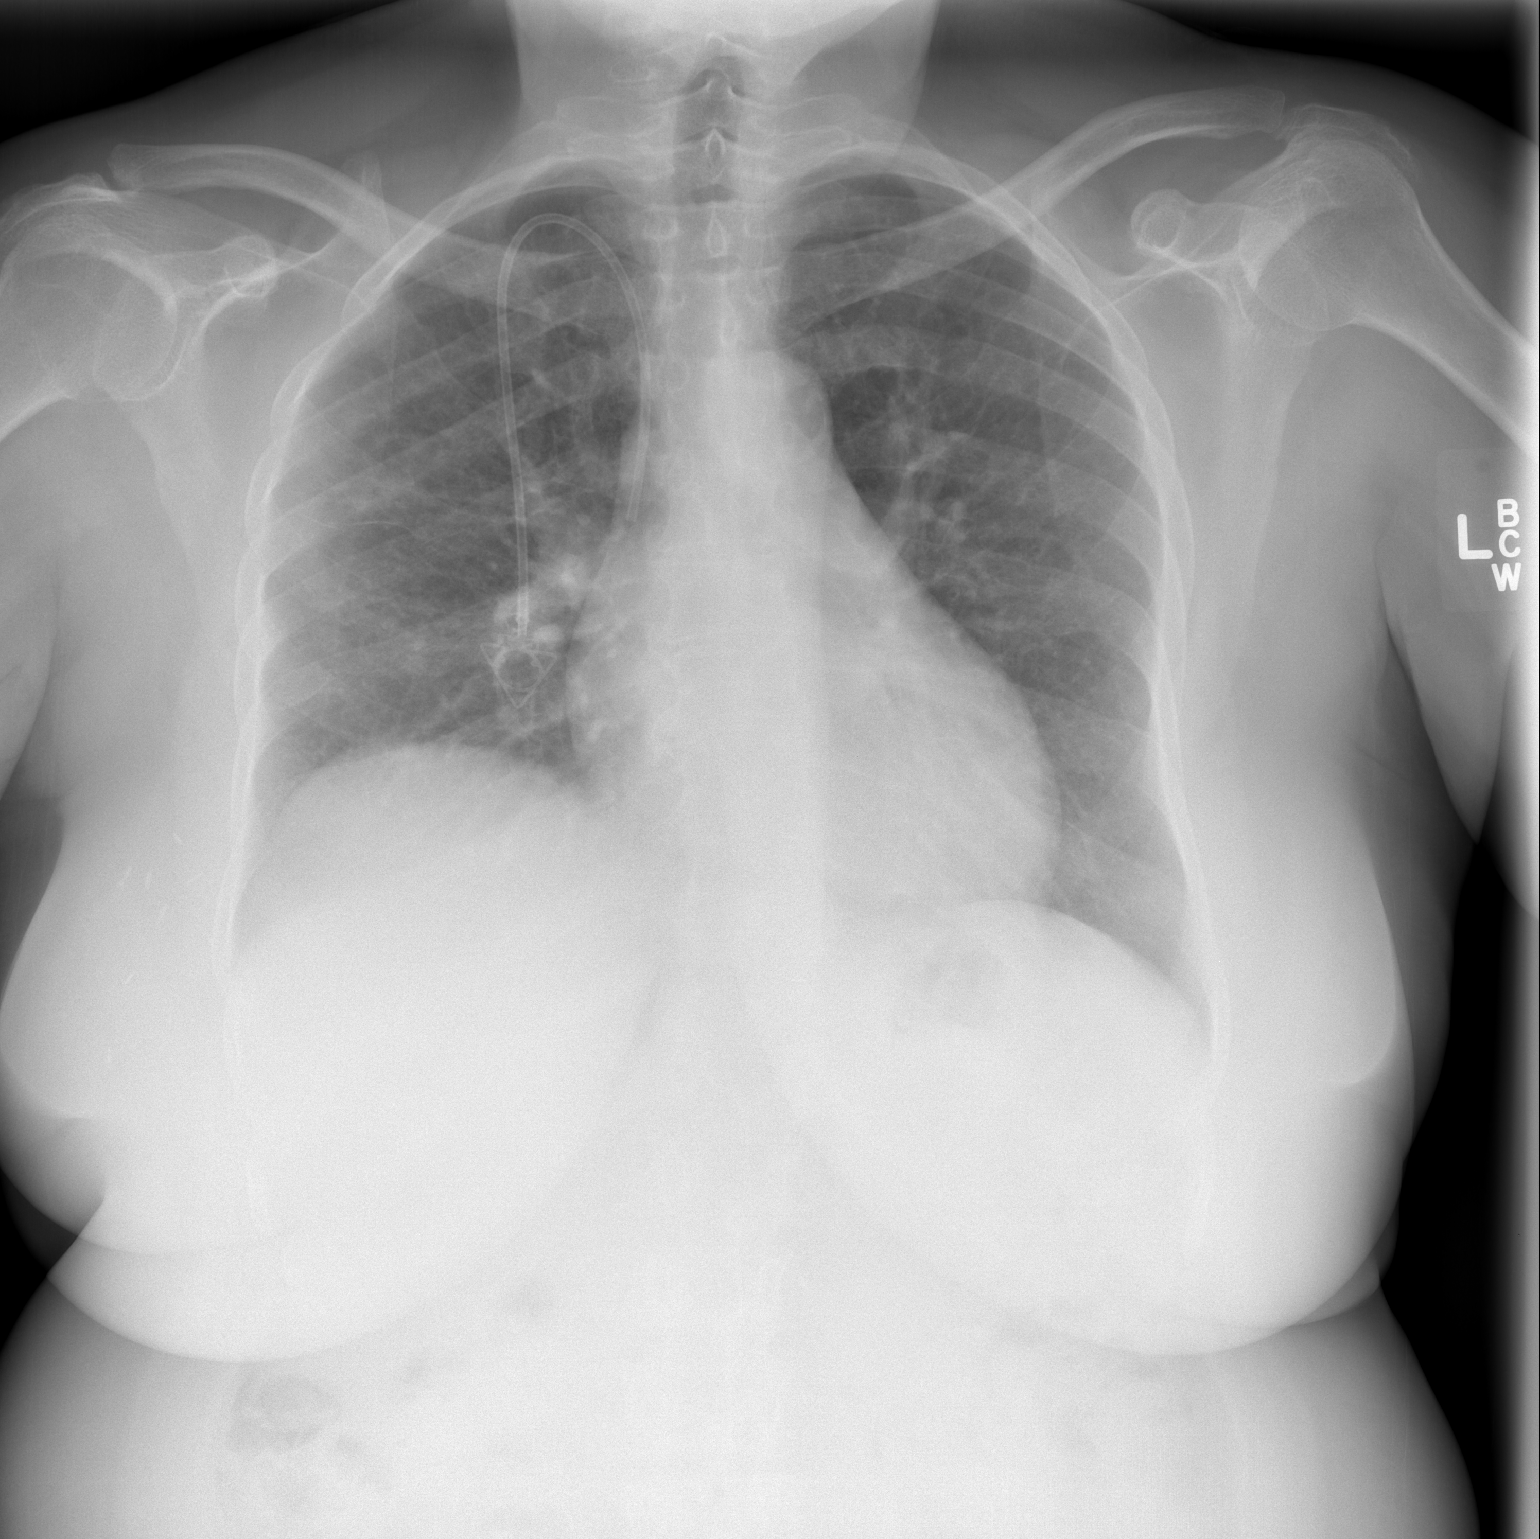

[w chest lat]
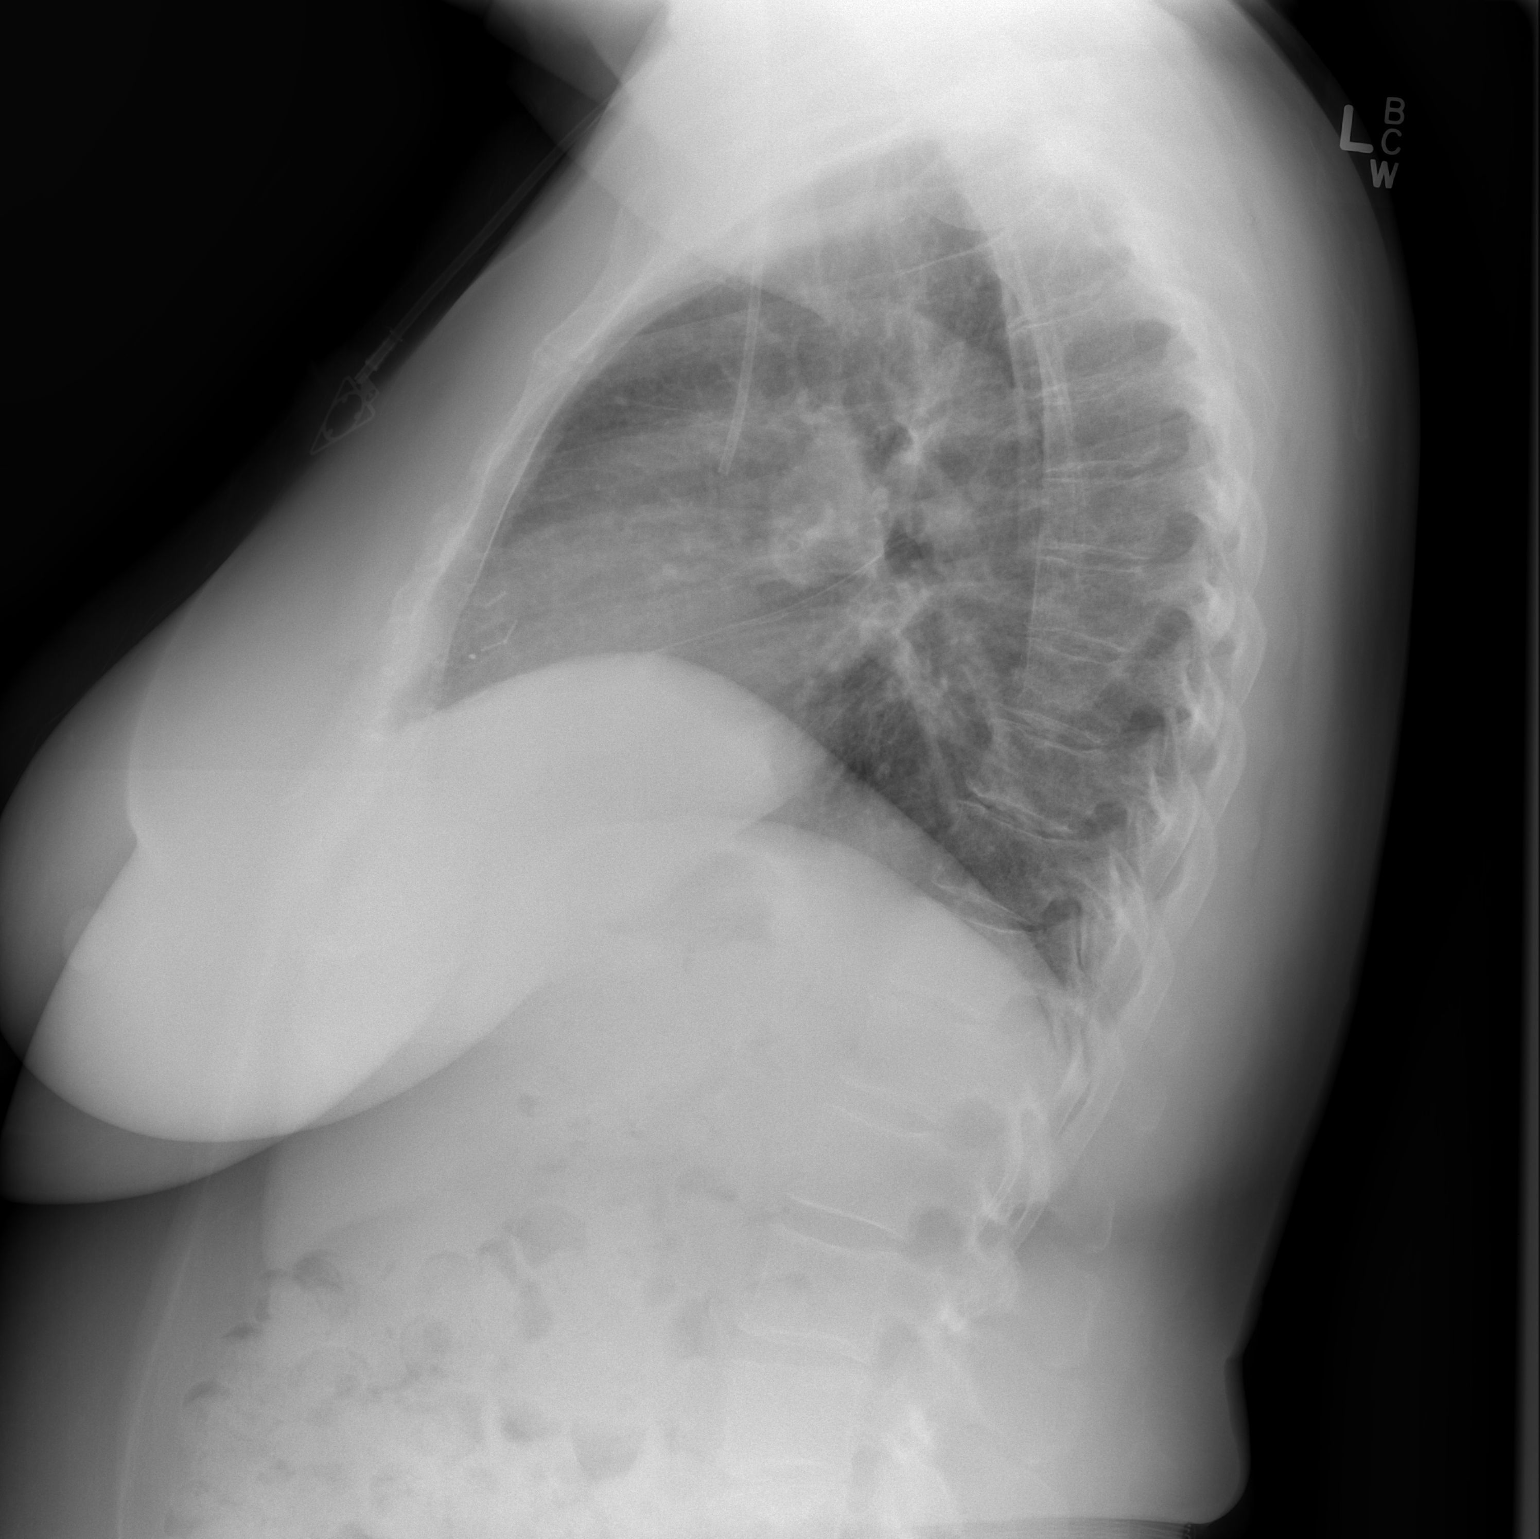

[2 of 2 positions shown; findings below may reference images not displayed]

FINDINGS: Heart size and vascular pattern are normal. Right Port-A-Cath noted
with tip over the cavoatrial junction. Mild elevation right
diaphragm. No pleural effusion.

Mild bilateral perihilar bronchial wall thickening with mildly
increased perihilar markings bilaterally.
IMPRESSION: Bilateral bronchitic change.

## 2015-09-21 ENCOUNTER — Telehealth: Payer: Self-pay | Admitting: *Deleted

## 2015-09-21 NOTE — Telephone Encounter (Signed)
Brandy Jackson called requesting she "come in to talk with Dr. Jana Hakim about treatment.  Severe adverse side effects from Tamoxifen to joints, back, ribs and eye pain.  1987 I had arthritis, psoriasis and was wheelchair bound.  I was changed to tamoxifen 07-31-2015 after similar problems with anastrozole.  These medicines make the arthritis flare up and I can't walk.  I took it upon myself to take dexamethasone Dr. Jana Hakim prescribed.  It helped. [facility] instructed me to tell Dr. Jana Hakim they want to start me on Methotrexate 25 mg and Prednisone to prevent these flare ups or I won't be able to take Tamoxifen.  I'll receive these meds next week. I also read that progesterone is good to take to block estrogen.  I prefer to take something like this instead of tamoxifen.  Return number [phone], I can be reached after 4:00 pm, it is okay to leave a message."

## 2015-09-24 ENCOUNTER — Other Ambulatory Visit: Payer: Self-pay | Admitting: *Deleted

## 2015-09-24 ENCOUNTER — Telehealth: Payer: Self-pay | Admitting: *Deleted

## 2015-09-24 NOTE — Telephone Encounter (Signed)
Call Documentation      Harmon Pier, LPN at 29/79/8921 19:41 AM     Status: Signed       Expand All Collapse All   Pt's husband called with complaints that wife is having a difficult time time tolerating the tamoxifen. Pt saw her rhematologist at the [facility] [place] and they think she may be having an arthritic crisis exacerbated by the use of tamoxifen.Husband said her rheumatologist would like to discuss if this medication could be changed. Pt will stop tamoxifen and see Dr. Jana Hakim on Nov. 2. Message to be fwd to Dr. Jana Hakim.

## 2015-09-24 NOTE — Telephone Encounter (Signed)
Pt's husband called with complaints that wife is having a difficult time time tolerating the tamoxifen. Pt saw her rhematologist at the [facility] and they think she may be having an arthritic crisis exacerbated by the use of tamoxifen.Husband said her rheumatologist would like to discuss if this medication could be changed. Pt will stop tamoxifen and see Dr. Jana Hakim on Nov. 2. Message to be fwd to Dr. Jana Hakim.

## 2015-09-25 ENCOUNTER — Telehealth: Payer: Self-pay | Admitting: Oncology

## 2015-09-25 NOTE — Telephone Encounter (Signed)
Called and left a message with 11/2 appointment per pof

## 2015-10-09 NOTE — Progress Notes (Signed)
ID: [name] OB: 1951/02/26  MR#: [id number]  MGN#:[id number]  PCP: Ricke Hey, MD GYN:   SU: Dr. Erroll Luna OTHER MD:  CHIEF COMPLAINT:  Right-sided breast cancer   CURRENT THERAPY: Observation  BREAST CANCER HISTORY: From the original consult note:  "Brandy Jackson is a 64 y.o. female. Patient palpated a right breast mass. She had a mammogram performed and she was noted to have a mass in the upper outer quadrant measuring 2.1 x 2.0 x 1.7 cm. 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 Tumor was ER positive PR positive HER-2/neu positive (triple positive) with a proliferation marker Ki-67 at 90%. She had MRI of the breasts performed which showed a solitary mass in the upper outer quadrant measuring 2.5 cm. Her case was discussed at the multidisciplinary breast conference."  Her subsequent treatment is as detailed below.   INTERVAL HISTORY:  Brandy Jackson returns today for follow up of her breast cancer accompaniedby her husband [name]. At the last visit here we set her up for an MRI of the spine through the [facility] hospital and this showed degenerative changes with small disc protrusions at L1-L2 and L5-S1, mild to moderate right neural foraminal narrowing at right L5-S1, and a fibroid uterus. There was no evidence of cancer. This is very favorable.  At that last visit we also started heron tamoxifen. She recalls starting at the fourth week in August. She took it for about 4 weeks, until the fourth week in September. By then though she was having a variety of symptoms including swelling of the right second digit, which continues; feeling short of breath; and worsening of her psoriasis and psoriatic arthritis in general. She also had very high blood sugars when started on steroids. Although she was told at the [facility] of the symptoms are not due to her tamoxifen she is convinced that they are and she says symptoms are now better since she went off  the tamoxifen.  REVIEW OF SYSTEMS:  Aside from the problems just mentioned she feels very fatigued. She has had some headaches but she says there are not more persistent frequent or intense than her usual. There have been no sudden visual changes, no nausea or vomiting, no dizziness or gait imbalance. Though she feels short of breath frequently she doesn't have a persistent cough, pleurisy, or purulent sputum production. Her bowel movements are very variable. In addition to the psoriasis she has some skin fungal infections particularly under the breasts which she finds hard to get rid of. A detailed review of systems today was otherwise stable  PAST MEDICAL HISTORY: Past Medical History  Diagnosis Date  . Diabetes mellitus without complication   . Arthritis   . Hot flashes   . Cancer   . Breast cancer   . Wears glasses   . Wears partial dentures     bottom  . Radiation 10/18/14-12/07/14    Invasive ductal carcinoma    PAST SURGICAL HISTORY: Past Surgical History  Procedure Laterality Date  . Bladder repair w/ cesarean section    . Colonoscopy    . Portacath placement N/A 04/11/2014    Procedure: INSERTION PORT-A-CATH;  Surgeon: Joyice Faster. Cornett, MD;  Location: [place];  Service: General;  Laterality: N/A;  . Re-excision of breast lumpectomy Right 06/06/2014    Procedure: RE-EXCISION OF BREAST LUMPECTOMY;  Surgeon: Marcello Moores A. Cornett, MD;  Location: [place];  Service: General;  Laterality: Right;  FAMILY HISTORY No family history on file.  GYNECOLOGIC HISTORY: menarche at age 89, G78 P3, went through menopause at age 41.  No h/o of HRT, no h/o abnormal pap smears, or sexually transmitted infections.  Does have frequent vaginal candida infections due to diabetes that she follows with her gynecologist for.    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 Her husband [name] is  retired from [employer]. He is a cancer survivor himself. The patient has 3 sons, one of whom was studying to be a pediatrician But now has switched to health management. All her children are married. She has 6 grandchildren.    ADVANCED DIRECTIVES: not in place.    HEALTH MAINTENANCE: Social History  Substance Use Topics  . Smoking status: Former Smoker -- 1.00 packs/day    Quit date: 03/08/1986  . Smokeless tobacco: Never Used  . Alcohol Use: No     Colonoscopy: Bone Density Scan: Obtained 12/21/2014 shows a normal T score Pap Smear:  Lipid Panel:    Allergies  Allergen Reactions  . Sulfa Antibiotics Rash    Current Outpatient Prescriptions  Medication Sig Dispense Refill  . anastrozole (ARIMIDEX) 1 MG tablet Take 1 tablet (1 mg total) by mouth daily. 90 tablet 4  . calcipotriene (DOVONOX) 0.005 % cream Apply 1 application topically 2 (two) times daily.    . cholecalciferol (VITAMIN D) 1000 UNITS tablet Take 1,000 Units by mouth daily.    . clobetasol (TEMOVATE) 0.05 % external solution Apply 1 application topically 2 (two) times daily.   0  . diclofenac (VOLTAREN) 75 MG EC tablet Take 1 tablet (75 mg total) by mouth 2 (two) times daily as needed.    . metFORMIN (GLUCOPHAGE) 500 MG tablet Take 500 mg by mouth 2 (two) times daily with a meal.    . Omega 3 340 MG CPDR Take 340 mg by mouth daily.    . tamoxifen (NOLVADEX) 20 MG tablet Take 1 tablet (20 mg total) by mouth daily. 90 tablet 3  . traMADol (ULTRAM) 50 MG tablet Take 1 tablet (50 mg total) by mouth every 6 (six) hours as needed. 60 tablet 2  . triamcinolone cream (KENALOG) 0.1 % Apply 1 application topically 2 (two) times daily.   0   No current facility-administered medications for this visit.    OBJECTIVE: Middle-aged [place] American woman who appears stated age  24 Vitals:   10/10/15 1629  BP: 151/69  Pulse: 86  Temp: 98 F (36.7 C)  Resp: 18     Body mass index is 32.62 kg/(m^2).      ECOG  FS:1  Sclerae unicteric, pupils round and equal Oropharynx clear and moist-- no thrush or other lesions No cervical or supraclavicular adenopathy Lungs no rales or rhonchi Heart regular rate and rhythm Abd soft, nontender, positive bowel sounds MSK no focal spinal tenderness, no upper extremity lymphedema, significant swelling of the PIP joint of the right index finger Neuro: nonfocal, well oriented, appropriate affect Breasts: the right breast is status post lumpectomy and radiation. The there are no skin or nipple changes of concern. The right axilla is benign. The left breast is unremarkable Skin: In addition to psoriatic lesions chiefly noted along the backthere is what appears to be a candidal rash in the left breast inframammary fold   LAB RESULTS:  CMP     Component Value Date/Time   NA 146* 04/13/2015 1312  NA 142 04/07/2014 1320   K 3.8 04/13/2015 1312   K 4.2 04/07/2014 1320   CL 103 04/07/2014 1320   CO2 24 04/13/2015 1312   CO2 25 04/07/2014 1320   GLUCOSE 136 04/13/2015 1312   GLUCOSE 126* 04/07/2014 1320   BUN 11.0 04/13/2015 1312   BUN 16 04/07/2014 1320   CREATININE 0.7 04/13/2015 1312   CREATININE 0.56 04/07/2014 1320   CALCIUM 9.3 04/13/2015 1312   CALCIUM 9.4 04/07/2014 1320   PROT 6.7 04/13/2015 1312   PROT 7.8 04/07/2014 1320   ALBUMIN 3.7 04/13/2015 1312   ALBUMIN 3.9 04/07/2014 1320   AST 18 04/13/2015 1312   AST 20 04/07/2014 1320   ALT 18 04/13/2015 1312   ALT 19 04/07/2014 1320   ALKPHOS 63 04/13/2015 1312   ALKPHOS 68 04/07/2014 1320   BILITOT 0.20 04/13/2015 1312   BILITOT 0.2* 04/07/2014 1320   GFRNONAA >90 04/07/2014 1320   GFRAA >90 04/07/2014 1320    I No results found for: SPEP  Lab Results  Component Value Date   WBC 5.0 04/13/2015   NEUTROABS 3.3 04/13/2015   HGB 11.4* 04/13/2015   HCT 35.6 04/13/2015   MCV 82.7 04/13/2015   PLT 211 04/13/2015      Chemistry      Component Value Date/Time   NA 146* 04/13/2015 1312    NA 142 04/07/2014 1320   K 3.8 04/13/2015 1312   K 4.2 04/07/2014 1320   CL 103 04/07/2014 1320   CO2 24 04/13/2015 1312   CO2 25 04/07/2014 1320   BUN 11.0 04/13/2015 1312   BUN 16 04/07/2014 1320   CREATININE 0.7 04/13/2015 1312   CREATININE 0.56 04/07/2014 1320      Component Value Date/Time   CALCIUM 9.3 04/13/2015 1312   CALCIUM 9.4 04/07/2014 1320   ALKPHOS 63 04/13/2015 1312   ALKPHOS 68 04/07/2014 1320   AST 18 04/13/2015 1312   AST 20 04/07/2014 1320   ALT 18 04/13/2015 1312   ALT 19 04/07/2014 1320   BILITOT 0.20 04/13/2015 1312   BILITOT 0.2* 04/07/2014 1320       No results found for: LABCA2  No components found for: LABCA125  No results for input(s): INR in the last 168 hours.  Urinalysis No results found for: COLORURINE  STUDIES: Outside studies reviewed with the patient  ASSESSMENT: 64 y.o. [place],woman status post right lumpectomy and sentinel lymph node sampling 04/11/2014 for a pT2 pN1, stage IIB invasive ductal carcinoma, grade 3, estrogen receptor strongly positive, progesterone receptor negative, with an MIB-1 of 94% and HER-2 amplification  (1) additional surgery in 06/06/2014 for margin clearance showed no evidence of residual malignancy  (2) received the first of six planned cycles of adjuvant chemotherapy with carboplatin docetaxel and trastuzumab 05/02/2014, with chemotherapy interrupted because of her intervening surgery; chemo resumed 06/23/2014, completing 6 cycles 09/15/2014  (3) a year of trastuzumab completed 04/13/15  (a) most recent echo 03/19/2015 showed a well preserved ejection fraction.  (4) adjuvant radiation completed 12/07/2014 Right breast (high tangents)/ 50.4 Gray @ 1.8 Gray per fraction x 28 fractions Right breast boost / 10 Gray at [employer] per fraction x 5 fractions  (5) anastrozole started 01/19/2015. Stopped after 1 week because of mood changes.  Restarted 05/09/2015, discontinued after 7 weeks with recurrent  arthralgias myalgias   (6) started tamoxifen 07/31/2015, stopped 09/03/2015 with multiple symptoms  PLAN:  I am not convinced that the problems Liany experienced while on tamoxifen actually  were due to tamoxifen. She was told the same at the [facility] hospital. However she is convinced of this tamoxifen is the cause of her arthritis, back pain, fatigue, and shortness of breath.  I don't think starting a third antiestrogen at this point is going to be effective. Likely she will continue to have the same symptoms, which are in my opinion not due to the drugs. A better alternative I think is for her to be off anti-estrogens for 3 months and keep a symptom diary. That way when we do start her again on anti-estrogens she can compare and see whether the symptoms are the same with and without the drug area did hopefully this will allow [place] to give her the extra protection that anti-estrogens wouldn't afford as far are breast cancer is concerned.  She is going to see [place] again in early February. Before that visit she will have lab work and a chest x-ray. At that visit we will probably start her on letrozole. She would then see me again in 3 months later to see how she tolerated it.   Today I did review the MRI of the spine that she brought [place] from the [facility]. There were also some labs obtained which they will be faxing but I did not receive yet. I did reassure her that I don't see anything in the MRI in particular suggestive of active cancer. She is now a year and a half out from her definitive surgerywith no evidence of recurrence, and that is favorable.   She wanted something different for pain then the diclofenac, which she says "tears up my stomach". She has Ultram on the medication list but has not been taking that. I was glad to refill that for her. She also wanted something for the candidal rash and I wrote her for ketoconazole cream. She will let me know if these are not effective.  She knows to  call for any problems that may develop before her next visit here. Chauncey Cruel, MD 10/10/2015 5:07 PM

## 2015-10-10 ENCOUNTER — Ambulatory Visit (HOSPITAL_BASED_OUTPATIENT_CLINIC_OR_DEPARTMENT_OTHER): Payer: BLUE CROSS/BLUE SHIELD | Admitting: Oncology

## 2015-10-10 ENCOUNTER — Other Ambulatory Visit: Payer: BLUE CROSS/BLUE SHIELD

## 2015-10-10 VITALS — BP 151/69 | HR 86 | Temp 98.0°F | Resp 18 | Ht 67.0 in | Wt 208.3 lb

## 2015-10-10 DIAGNOSIS — E118 Type 2 diabetes mellitus with unspecified complications: Secondary | ICD-10-CM

## 2015-10-10 DIAGNOSIS — R21 Rash and other nonspecific skin eruption: Secondary | ICD-10-CM | POA: Diagnosis not present

## 2015-10-10 DIAGNOSIS — R0602 Shortness of breath: Secondary | ICD-10-CM | POA: Diagnosis not present

## 2015-10-10 DIAGNOSIS — M545 Low back pain: Secondary | ICD-10-CM | POA: Diagnosis not present

## 2015-10-10 DIAGNOSIS — T451X5A Adverse effect of antineoplastic and immunosuppressive drugs, initial encounter: Secondary | ICD-10-CM

## 2015-10-10 DIAGNOSIS — C50411 Malignant neoplasm of upper-outer quadrant of right female breast: Secondary | ICD-10-CM

## 2015-10-10 DIAGNOSIS — L4050 Arthropathic psoriasis, unspecified: Secondary | ICD-10-CM

## 2015-10-10 DIAGNOSIS — G620 Drug-induced polyneuropathy: Secondary | ICD-10-CM

## 2015-10-10 MED ORDER — KETOCONAZOLE 2 % EX CREA: 1.0000 "application " | TOPICAL_CREAM | Freq: Every day | CUTANEOUS | Status: DC

## 2015-10-10 MED ORDER — TRAMADOL HCL 50 MG PO TABS: 50.0000 mg | ORAL_TABLET | Freq: Four times a day (QID) | ORAL | Status: DC | PRN

## 2015-10-11 ENCOUNTER — Telehealth: Payer: Self-pay | Admitting: Nurse Practitioner

## 2015-10-11 NOTE — Telephone Encounter (Signed)
Called and left a message with next years appointments

## 2015-10-22 ENCOUNTER — Telehealth (HOSPITAL_COMMUNITY): Payer: Self-pay | Admitting: Vascular Surgery

## 2015-10-22 NOTE — Telephone Encounter (Signed)
Left message make pt appt w/ ECHO

## 2015-10-30 ENCOUNTER — Ambulatory Visit: Payer: BLUE CROSS/BLUE SHIELD | Admitting: Nurse Practitioner

## 2015-10-30 ENCOUNTER — Other Ambulatory Visit: Payer: BLUE CROSS/BLUE SHIELD

## 2015-11-16 ENCOUNTER — Telehealth (HOSPITAL_COMMUNITY): Payer: Self-pay | Admitting: Vascular Surgery

## 2015-11-16 NOTE — Telephone Encounter (Signed)
Left pt message to call office for f/u w/ ECHO

## 2015-11-28 ENCOUNTER — Encounter (HOSPITAL_COMMUNITY): Payer: Self-pay | Admitting: Vascular Surgery

## 2015-11-30 ENCOUNTER — Other Ambulatory Visit: Payer: Self-pay

## 2015-11-30 DIAGNOSIS — C50411 Malignant neoplasm of upper-outer quadrant of right female breast: Secondary | ICD-10-CM

## 2015-11-30 MED ORDER — KETOCONAZOLE 2 % EX CREA: 1.0000 "application " | TOPICAL_CREAM | Freq: Every day | CUTANEOUS | Status: DC

## 2015-12-05 ENCOUNTER — Other Ambulatory Visit: Payer: Self-pay | Admitting: *Deleted

## 2016-01-02 ENCOUNTER — Other Ambulatory Visit: Payer: Self-pay | Admitting: *Deleted

## 2016-01-02 DIAGNOSIS — E559 Vitamin D deficiency, unspecified: Secondary | ICD-10-CM

## 2016-01-02 DIAGNOSIS — C50411 Malignant neoplasm of upper-outer quadrant of right female breast: Secondary | ICD-10-CM

## 2016-01-03 ENCOUNTER — Ambulatory Visit (HOSPITAL_COMMUNITY)
Admission: RE | Admit: 2016-01-03 | Discharge: 2016-01-03 | Disposition: A | Discharge status: Home / Self Care | Payer: BLUE CROSS/BLUE SHIELD | Source: Ambulatory Visit | Attending: Oncology | Admitting: Oncology

## 2016-01-03 ENCOUNTER — Other Ambulatory Visit (HOSPITAL_BASED_OUTPATIENT_CLINIC_OR_DEPARTMENT_OTHER): Payer: BLUE CROSS/BLUE SHIELD

## 2016-01-03 DIAGNOSIS — R079 Chest pain, unspecified: Secondary | ICD-10-CM | POA: Diagnosis not present

## 2016-01-03 DIAGNOSIS — C50411 Malignant neoplasm of upper-outer quadrant of right female breast: Secondary | ICD-10-CM

## 2016-01-03 DIAGNOSIS — Z87891 Personal history of nicotine dependence: Secondary | ICD-10-CM | POA: Insufficient documentation

## 2016-01-03 DIAGNOSIS — E559 Vitamin D deficiency, unspecified: Secondary | ICD-10-CM

## 2016-01-03 LAB — COMPREHENSIVE METABOLIC PANEL
ALT: 22 U/L (ref 0–55)
AST: 22 U/L (ref 5–34)
Albumin: 3.8 g/dL (ref 3.5–5.0)
Alkaline Phosphatase: 76 U/L (ref 40–150)
Anion Gap: 10 mEq/L (ref 3–11)
BUN: 13.6 mg/dL (ref 7.0–26.0)
CHLORIDE: 106 meq/L (ref 98–109)
CO2: 26 meq/L (ref 22–29)
CREATININE: 0.7 mg/dL (ref 0.6–1.1)
Calcium: 9.8 mg/dL (ref 8.4–10.4)
Glucose: 167 mg/dl — ABNORMAL HIGH (ref 70–140)
Potassium: 4 mEq/L (ref 3.5–5.1)
Sodium: 142 mEq/L (ref 136–145)
TOTAL PROTEIN: 7.6 g/dL (ref 6.4–8.3)

## 2016-01-03 LAB — CBC WITH DIFFERENTIAL/PLATELET
BASO%: 0.8 % (ref 0.0–2.0)
Basophils Absolute: 0 10*3/uL (ref 0.0–0.1)
EOS ABS: 0 10*3/uL (ref 0.0–0.5)
EOS%: 0.4 % (ref 0.0–7.0)
HEMATOCRIT: 35.6 % (ref 34.8–46.6)
HGB: 11.3 g/dL — ABNORMAL LOW (ref 11.6–15.9)
LYMPH#: 1.6 10*3/uL (ref 0.9–3.3)
LYMPH%: 25.3 % (ref 14.0–49.7)
MCH: 25.2 pg (ref 25.1–34.0)
MCHC: 31.7 g/dL (ref 31.5–36.0)
MCV: 79.5 fL (ref 79.5–101.0)
MONO#: 0.4 10*3/uL (ref 0.1–0.9)
MONO%: 7 % (ref 0.0–14.0)
NEUT#: 4.1 10*3/uL (ref 1.5–6.5)
NEUT%: 66.5 % (ref 38.4–76.8)
Platelets: 257 10*3/uL (ref 145–400)
RBC: 4.48 10*6/uL (ref 3.70–5.45)
RDW: 15.1 % — ABNORMAL HIGH (ref 11.2–14.5)
WBC: 6.2 10*3/uL (ref 3.9–10.3)

## 2016-01-04 LAB — VITAMIN D 25 HYDROXY (VIT D DEFICIENCY, FRACTURES): VIT D 25 HYDROXY: 29.1 ng/mL — AB (ref 30.0–100.0)

## 2016-01-10 ENCOUNTER — Telehealth: Payer: Self-pay | Admitting: Oncology

## 2016-01-10 ENCOUNTER — Ambulatory Visit (HOSPITAL_BASED_OUTPATIENT_CLINIC_OR_DEPARTMENT_OTHER): Payer: BLUE CROSS/BLUE SHIELD | Admitting: Nurse Practitioner

## 2016-01-10 ENCOUNTER — Other Ambulatory Visit: Payer: Self-pay | Admitting: *Deleted

## 2016-01-10 ENCOUNTER — Encounter: Payer: Self-pay | Admitting: Nurse Practitioner

## 2016-01-10 VITALS — BP 159/75 | HR 88 | Temp 97.8°F | Resp 18 | Ht 67.0 in | Wt 199.3 lb

## 2016-01-10 DIAGNOSIS — C50411 Malignant neoplasm of upper-outer quadrant of right female breast: Secondary | ICD-10-CM | POA: Diagnosis not present

## 2016-01-10 DIAGNOSIS — E559 Vitamin D deficiency, unspecified: Secondary | ICD-10-CM

## 2016-01-10 MED ORDER — KETOCONAZOLE 2 % EX CREA: 1.0000 "application " | TOPICAL_CREAM | Freq: Every day | CUTANEOUS | Status: DC

## 2016-01-10 MED ORDER — LETROZOLE 2.5 MG PO TABS: 2.5000 mg | ORAL_TABLET | Freq: Every day | ORAL | Status: DC

## 2016-01-10 MED ORDER — CLOBETASOL PROPIONATE 0.05 % EX SOLN: 1.0000 "application " | Freq: Two times a day (BID) | CUTANEOUS | Status: DC

## 2016-01-10 NOTE — Progress Notes (Signed)
ID: Brandy Jackson OB: 01-24-1951  MR#: [id number]  JOA#:[id number]  PCP: Ricke Hey, MD GYN:   SU: Dr. Erroll Luna OTHER MD:  CHIEF COMPLAINT:  Right-sided breast cancer   CURRENT THERAPY: letrozole  BREAST CANCER HISTORY: From the original consult note:  "Brandy Jackson is a 65 y.o. female. Patient palpated a right breast mass. She had a mammogram performed and she was noted to have a mass in the upper outer quadrant measuring 2.1 x 2.0 x 1.7 cm. 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 Tumor was ER positive PR positive HER-2/neu positive (triple positive) with a proliferation marker Ki-67 at 90%. She had MRI of the breasts performed which showed a solitary mass in the upper outer quadrant measuring 2.5 cm. Her case was discussed at the multidisciplinary breast conference."  Her subsequent treatment is as detailed below.   INTERVAL HISTORY:  Brandy Jackson returns today for follow up of her breast cancer, alone (her husband remained in the lobby. At her last visit, all antiestrogen therapy was stopped to allow for a 3 month "washout" to allow all side effects to resolve and for her to return to her baseline. She started several supplements including vitamins E, B6, and B12, as well as chondroitin and glucosamine. She is full of energy and denies pain. She states she hasn't "felt this good since age 5!" She is ready to discuss starting letrozole now.  REVIEW OF SYSTEMS:  Brandy Jackson has some shooting pains to the right breast, but she understands these to be normal. She is getting over sinus congestion and has a mild non-productive cough. She denies shortness of breath or chest pain. Her skin is stable with her usual psoriasis lesions, but no fungal infection under her breasts. She switched to a new deodorant that makes her axillae almost too dry, so she is going to pick something else next time. The glucosamine makes her somewhat constipated, but she  drinks plenty of water. A detailed review of systems is otherwise stable.  PAST MEDICAL HISTORY: Past Medical History  Diagnosis Date  . Diabetes mellitus without complication   . Arthritis   . Hot flashes   . Cancer   . Breast cancer   . Wears glasses   . Wears partial dentures     bottom  . Radiation 10/18/14-12/07/14    Invasive ductal carcinoma    PAST SURGICAL HISTORY: Past Surgical History  Procedure Laterality Date  . Bladder repair w/ cesarean section    . Colonoscopy    . Portacath placement N/A 04/11/2014    Procedure: INSERTION PORT-A-CATH;  Surgeon: Joyice Faster. Cornett, MD;  Location: [place];  Service: General;  Laterality: N/A;  . Re-excision of breast lumpectomy Right 06/06/2014    Procedure: RE-EXCISION OF BREAST LUMPECTOMY;  Surgeon: Marcello Moores A. Cornett, MD;  Location: [place];  Service: General;  Laterality: Right;    FAMILY HISTORY No family history on file.  GYNECOLOGIC HISTORY: menarche at age 39, G77 P3, went through menopause at age 40.  No h/o of HRT, no h/o abnormal pap smears, or sexually transmitted infections.  Does have frequent vaginal candida infections due to diabetes that she follows with her gynecologist for.    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 Her husband [name] is retired from [employer]. He is a cancer survivor himself. The patient has 3 sons, one of whom  was studying to be a pediatrician But now has switched to health management. All her children are married. She has 6 grandchildren.    ADVANCED DIRECTIVES: not in place.    HEALTH MAINTENANCE: Social History  Substance Use Topics  . Smoking status: Former Smoker -- 1.00 packs/day    Quit date: 03/08/1986  . Smokeless tobacco: Never Used  . Alcohol Use: No     Colonoscopy: Bone Density Scan: Obtained 12/21/2014 shows a normal T score Pap Smear:  Lipid Panel:     Allergies  Allergen Reactions  . Sulfa Antibiotics Rash    Current Outpatient Prescriptions  Medication Sig Dispense Refill  . calcipotriene (DOVONOX) 0.005 % cream Apply 1 application topically 2 (two) times daily.    . cholecalciferol (VITAMIN D) 1000 UNITS tablet Take 1,000 Units by mouth daily.    . diclofenac (VOLTAREN) 75 MG EC tablet Take 1 tablet (75 mg total) by mouth 2 (two) times daily as needed.    . metFORMIN (GLUCOPHAGE) 500 MG tablet Take 500 mg by mouth 2 (two) times daily with a meal.    . Omega 3 340 MG CPDR Take 340 mg by mouth daily.    . Pyridoxine HCl (VITAMIN B-6) 250 MG tablet Take 250 mg by mouth daily.    . traMADol (ULTRAM) 50 MG tablet Take 1 tablet (50 mg total) by mouth every 6 (six) hours as needed. 60 tablet 2  . vitamin B-12 (CYANOCOBALAMIN) 500 MCG tablet Take 500 mcg by mouth daily.    . vitamin E 400 UNIT capsule Take 400 Units by mouth daily.    . clobetasol (TEMOVATE) 0.05 % external solution Apply 1 application topically 2 (two) times daily. 50 mL 0  . ketoconazole (NIZORAL) 2 % cream Apply 1 application topically daily. 30 g 0  . letrozole (FEMARA) 2.5 MG tablet Take 1 tablet (2.5 mg total) by mouth daily. 30 tablet 4  . triamcinolone cream (KENALOG) 0.1 % Apply 1 application topically 2 (two) times daily. Reported on 01/10/2016  0   No current facility-administered medications for this visit.    OBJECTIVE: Middle-aged [place] American woman who appears stated age  54 Vitals:   01/10/16 1511  BP: 159/75  Pulse: 88  Temp: 97.8 F (36.6 C)  Resp: 18     Body mass index is 31.21 kg/(m^2).      ECOG FS:1  Skin: warm, dry  HEENT: sclerae anicteric, conjunctivae pink, oropharynx clear. No thrush or mucositis.  Lymph Nodes: No cervical or supraclavicular lymphadenopathy  Lungs: clear to auscultation bilaterally, no rales, wheezes, or rhonci  Heart: regular rate and rhythm  Abdomen: round, soft, non tender, positive bowel sounds   Musculoskeletal: No focal spinal tenderness, no peripheral edema  Neuro: non focal, well oriented, positive affect  Breasts: right breast status pot lumpectomy and radiation. No evidence of recurrent disease. Right axilla benign. Left breast unremarkable.  LAB RESULTS:  CMP     Component Value Date/Time   NA 142 01/03/2016 1553   NA 142 04/07/2014 1320   K 4.0 01/03/2016 1553   K 4.2 04/07/2014 1320   CL 103 04/07/2014 1320   CO2 26 01/03/2016 1553   CO2 25 04/07/2014 1320   GLUCOSE 167* 01/03/2016 1553   GLUCOSE 126* 04/07/2014 1320   BUN 13.6 01/03/2016 1553   BUN 16 04/07/2014 1320   CREATININE 0.7 01/03/2016 1553   CREATININE 0.56 04/07/2014 1320   CALCIUM 9.8 01/03/2016 1553   CALCIUM 9.4 04/07/2014 1320  PROT 7.6 01/03/2016 1553   PROT 7.8 04/07/2014 1320   ALBUMIN 3.8 01/03/2016 1553   ALBUMIN 3.9 04/07/2014 1320   AST 22 01/03/2016 1553   AST 20 04/07/2014 1320   ALT 22 01/03/2016 1553   ALT 19 04/07/2014 1320   ALKPHOS 76 01/03/2016 1553   ALKPHOS 68 04/07/2014 1320   BILITOT <0.30 01/03/2016 1553   BILITOT 0.2* 04/07/2014 1320   GFRNONAA >90 04/07/2014 1320   GFRAA >90 04/07/2014 1320    I No results found for: SPEP  Lab Results  Component Value Date   WBC 6.2 01/03/2016   NEUTROABS 4.1 01/03/2016   HGB 11.3* 01/03/2016   HCT 35.6 01/03/2016   MCV 79.5 01/03/2016   PLT 257 01/03/2016      Chemistry      Component Value Date/Time   NA 142 01/03/2016 1553   NA 142 04/07/2014 1320   K 4.0 01/03/2016 1553   K 4.2 04/07/2014 1320   CL 103 04/07/2014 1320   CO2 26 01/03/2016 1553   CO2 25 04/07/2014 1320   BUN 13.6 01/03/2016 1553   BUN 16 04/07/2014 1320   CREATININE 0.7 01/03/2016 1553   CREATININE 0.56 04/07/2014 1320      Component Value Date/Time   CALCIUM 9.8 01/03/2016 1553   CALCIUM 9.4 04/07/2014 1320   ALKPHOS 76 01/03/2016 1553   ALKPHOS 68 04/07/2014 1320   AST 22 01/03/2016 1553   AST 20 04/07/2014 1320   ALT 22 01/03/2016  1553   ALT 19 04/07/2014 1320   BILITOT <0.30 01/03/2016 1553   BILITOT 0.2* 04/07/2014 1320       No results found for: LABCA2  No components found for: LABCA125  No results for input(s): INR in the last 168 hours.  Urinalysis No results found for: COLORURINE  STUDIES: Dg Chest 2 View  01/03/2016  CLINICAL DATA:  Right-sided chest pain for couple weeks. Recent cold. History of right-sided breast cancer and lumpectomy in June of 2015. Former smoker. EXAM: CHEST  2 VIEW COMPARISON:  Chest x-ray dated 02/09/2015. FINDINGS: Heart size is normal. Overall cardiomediastinal silhouette is stable in size and configuration. Right chest wall Port-A-Cath has been removed in the interval. Lungs are clear. No evidence of pneumonia. No pulmonary nodule or mass seen. No pleural effusion. No pneumothorax seen. Surgical clips are seen along the right lateral chest wall consistent with the given history of lumpectomy. There is slight elevation/eventration of the right hemidiaphragm, unchanged. Osseous and soft tissue structures about the chest are otherwise unremarkable. IMPRESSION: Stable chest x-ray, except for the interval removal of a Port-A-Cath. Lungs are clear and there is no evidence of acute cardiopulmonary abnormality. Electronically Signed   By: Franki Cabot M.D.   On: 01/03/2016 16:54    ASSESSMENT: 65 y.o. [place],woman status post right lumpectomy and sentinel lymph node sampling 04/11/2014 for a pT2 pN1, stage IIB invasive ductal carcinoma, grade 3, estrogen receptor strongly positive, progesterone receptor negative, with an MIB-1 of 94% and HER-2 amplification  (1) additional surgery in 06/06/2014 for margin clearance showed no evidence of residual malignancy  (2) received the first of six planned cycles of adjuvant chemotherapy with carboplatin docetaxel and trastuzumab 05/02/2014, with chemotherapy interrupted because of her intervening surgery; chemo resumed 06/23/2014, completing 6  cycles 09/15/2014  (3) a year of trastuzumab completed 04/13/15  (a) most recent echo 03/19/2015 showed a well preserved ejection fraction.  (4) adjuvant radiation completed 12/07/2014 Right breast (high tangents)/ 50.4 Gray @  1.8 Gray per fraction x 28 fractions Right breast boost / 10 Gray at [employer] per fraction x 5 fractions  (5) anastrozole started 01/19/2015. Stopped after 1 week because of mood changes.  Restarted 05/09/2015, discontinued after 7 weeks with recurrent arthralgias myalgias  (6) started tamoxifen 07/31/2015, stopped 09/03/2015 with multiple symptoms  (7) started letrozole 01/11/2016  PLAN:  Minsa feels "revitalized" today. She is in no pain and feels the best she has in months. Now would be a good time to start letrozole. We reviewed potential side effects of this drug including hot flashes, vaginal dryness, and arthralgias/myalgias. She is hesitant, given her experience with past antiestrogen drugs, but she is will to start this one immediately.   She had a chest xray last week that was negative. The labs were reviewed in detail and were stable. Her vitamin D level should improve now that she is on a steady supplement.   Reta will return in 3 months for a follow up visit to assess her tolerance. She understands and agrees with this plan. She knows the goal of treatment in her case is cure. She has been encouraged to call with any issues that might arise before her next visit here.   Laurie Panda, NP 01/10/2016 3:56 PM

## 2016-01-10 NOTE — Telephone Encounter (Signed)
Appointments made and avs printed for patient °

## 2016-02-08 ENCOUNTER — Telehealth: Payer: Self-pay | Admitting: *Deleted

## 2016-02-08 NOTE — Telephone Encounter (Signed)
Pt's husband, [name] called. He said pt had recently went for a follow up appt at the [facility]. Labs were drawn and her doctor said her liver enzymes were slightly elevated than before. His recommendation to pt was to perhaps stop the letrozole and see Dr. Jana Hakim to determine why her enzymes are elevated. Pt's husband seem to think the letrozole may be the cause b/c this is the only new medication she has started recently. Pt saw Heather in Feb and started letrozole then. I asked pt's husband if he can fax those labs to [place] ASAP. In the meantime, I can schedule her for labs and to see Towana Badger. Pt has an appt with Dr. Jana Hakim in May. Pt was not at home during this telephone call, she is at work. Message to be fwd to [employer].

## 2016-02-11 ENCOUNTER — Other Ambulatory Visit: Payer: Self-pay | Admitting: *Deleted

## 2016-02-11 ENCOUNTER — Telehealth: Payer: Self-pay | Admitting: Oncology

## 2016-02-11 NOTE — Telephone Encounter (Signed)
Left message for patient lab only appt on 3/8 at 4 pm per 3/6 pof

## 2016-02-13 ENCOUNTER — Telehealth: Payer: Self-pay | Admitting: *Deleted

## 2016-02-13 ENCOUNTER — Other Ambulatory Visit (HOSPITAL_BASED_OUTPATIENT_CLINIC_OR_DEPARTMENT_OTHER): Payer: BLUE CROSS/BLUE SHIELD

## 2016-02-13 DIAGNOSIS — E559 Vitamin D deficiency, unspecified: Secondary | ICD-10-CM

## 2016-02-13 DIAGNOSIS — C50411 Malignant neoplasm of upper-outer quadrant of right female breast: Secondary | ICD-10-CM | POA: Diagnosis not present

## 2016-02-13 LAB — COMPREHENSIVE METABOLIC PANEL
ALT: 19 U/L (ref 0–55)
ANION GAP: 10 meq/L (ref 3–11)
AST: 19 U/L (ref 5–34)
Albumin: 3.9 g/dL (ref 3.5–5.0)
Alkaline Phosphatase: 70 U/L (ref 40–150)
BUN: 16.7 mg/dL (ref 7.0–26.0)
CO2: 24 mEq/L (ref 22–29)
Calcium: 9.5 mg/dL (ref 8.4–10.4)
Chloride: 106 mEq/L (ref 98–109)
Creatinine: 0.7 mg/dL (ref 0.6–1.1)
EGFR: >90 mL/min/{1.73_m2} (ref >90)
Glucose: 111 mg/dl (ref 70–140)
Potassium: 3.8 mEq/L (ref 3.5–5.1)
Sodium: 140 mEq/L (ref 136–145)
TOTAL PROTEIN: 7.6 g/dL (ref 6.4–8.3)

## 2016-02-13 LAB — CBC WITH DIFFERENTIAL/PLATELET
BASO%: 0.9 % (ref 0.0–2.0)
Basophils Absolute: 0.1 10*3/uL (ref 0.0–0.1)
EOS%: 0.3 % (ref 0.0–7.0)
Eosinophils Absolute: 0 10*3/uL (ref 0.0–0.5)
HCT: 34.6 % — ABNORMAL LOW (ref 34.8–46.6)
HEMOGLOBIN: 11.1 g/dL — AB (ref 11.6–15.9)
LYMPH#: 1.5 10*3/uL (ref 0.9–3.3)
LYMPH%: 20.8 % (ref 14.0–49.7)
MCH: 25.4 pg (ref 25.1–34.0)
MCHC: 32 g/dL (ref 31.5–36.0)
MCV: 79.3 fL — ABNORMAL LOW (ref 79.5–101.0)
MONO#: 0.5 10*3/uL (ref 0.1–0.9)
MONO%: 7.5 % (ref 0.0–14.0)
NEUT#: 5 10*3/uL (ref 1.5–6.5)
NEUT%: 70.5 % (ref 38.4–76.8)
PLATELETS: 276 10*3/uL (ref 145–400)
RBC: 4.36 10*6/uL (ref 3.70–5.45)
RDW: 15.5 % — AB (ref 11.2–14.5)
WBC: 7.1 10*3/uL (ref 3.9–10.3)

## 2016-02-13 NOTE — Telephone Encounter (Signed)
Called pt to review labs drawn on 02/13/16. No answer but left detailed message. Communicated thru VM to let her know all labs were within normal limits. There was no elevation in liver enzymes which she was most worried about since labs at [facility] showed an elevation. [facility] never faxed those results over to this office. I sent over request twice. I advised pt to restart her letrozole and if there is a reason she does not want to take this medication she will need to discuss this matter at her upcoming appt. I will call her back to try to talk with her. Message to be fwd to [employer].

## 2016-02-14 LAB — VITAMIN D 25 HYDROXY (VIT D DEFICIENCY, FRACTURES): VIT D 25 HYDROXY: 32.5 ng/mL (ref 30.0–100.0)

## 2016-04-16 ENCOUNTER — Other Ambulatory Visit: Payer: Self-pay | Admitting: *Deleted

## 2016-04-16 DIAGNOSIS — C50411 Malignant neoplasm of upper-outer quadrant of right female breast: Secondary | ICD-10-CM

## 2016-04-17 ENCOUNTER — Other Ambulatory Visit (HOSPITAL_BASED_OUTPATIENT_CLINIC_OR_DEPARTMENT_OTHER): Payer: BLUE CROSS/BLUE SHIELD

## 2016-04-17 ENCOUNTER — Encounter: Payer: Self-pay | Admitting: Oncology

## 2016-04-17 ENCOUNTER — Ambulatory Visit (HOSPITAL_BASED_OUTPATIENT_CLINIC_OR_DEPARTMENT_OTHER): Payer: BLUE CROSS/BLUE SHIELD | Admitting: Oncology

## 2016-04-17 ENCOUNTER — Telehealth: Payer: Self-pay | Admitting: Oncology

## 2016-04-17 VITALS — BP 133/62 | HR 86 | Temp 98.6°F | Resp 18 | Ht 67.0 in | Wt 194.0 lb

## 2016-04-17 DIAGNOSIS — K649 Unspecified hemorrhoids: Secondary | ICD-10-CM | POA: Diagnosis not present

## 2016-04-17 DIAGNOSIS — C50411 Malignant neoplasm of upper-outer quadrant of right female breast: Secondary | ICD-10-CM

## 2016-04-17 LAB — COMPREHENSIVE METABOLIC PANEL
ALT: 21 U/L (ref 0–55)
ANION GAP: 8 meq/L (ref 3–11)
AST: 15 U/L (ref 5–34)
Albumin: 3.7 g/dL (ref 3.5–5.0)
Alkaline Phosphatase: 70 U/L (ref 40–150)
BUN: 16.8 mg/dL (ref 7.0–26.0)
CHLORIDE: 106 meq/L (ref 98–109)
CO2: 26 meq/L (ref 22–29)
CREATININE: 0.8 mg/dL (ref 0.6–1.1)
Calcium: 9.7 mg/dL (ref 8.4–10.4)
GLUCOSE: 176 mg/dL — AB (ref 70–140)
Potassium: 3.8 mEq/L (ref 3.5–5.1)
Sodium: 140 mEq/L (ref 136–145)
TOTAL PROTEIN: 7.4 g/dL (ref 6.4–8.3)

## 2016-04-17 LAB — CBC WITH DIFFERENTIAL/PLATELET
BASO%: 0.3 % (ref 0.0–2.0)
Basophils Absolute: 0 10*3/uL (ref 0.0–0.1)
EOS%: 0.6 % (ref 0.0–7.0)
Eosinophils Absolute: 0 10*3/uL (ref 0.0–0.5)
HEMATOCRIT: 34.3 % — AB (ref 34.8–46.6)
HGB: 11.1 g/dL — ABNORMAL LOW (ref 11.6–15.9)
LYMPH#: 2.2 10*3/uL (ref 0.9–3.3)
LYMPH%: 29.9 % (ref 14.0–49.7)
MCH: 25.1 pg (ref 25.1–34.0)
MCHC: 32.4 g/dL (ref 31.5–36.0)
MCV: 77.6 fL — ABNORMAL LOW (ref 79.5–101.0)
MONO#: 0.4 10*3/uL (ref 0.1–0.9)
MONO%: 5.1 % (ref 0.0–14.0)
NEUT%: 64.1 % (ref 38.4–76.8)
NEUTROS ABS: 4.6 10*3/uL (ref 1.5–6.5)
Platelets: 266 10*3/uL (ref 145–400)
RBC: 4.42 10*6/uL (ref 3.70–5.45)
RDW: 15.6 % — ABNORMAL HIGH (ref 11.2–14.5)
WBC: 7.2 10*3/uL (ref 3.9–10.3)

## 2016-04-17 NOTE — Telephone Encounter (Signed)
appt made and avs printed °

## 2016-04-17 NOTE — Progress Notes (Signed)
ID: [name] OB: 04/08/1951  MR#: [id number]  JZP#:[id number]  PCP: Ricke Hey, MD GYN:   SU: Dr. Erroll Luna OTHER MD:  CHIEF COMPLAINT:  Right-sided breast cancer   CURRENT THERAPY: observation  BREAST CANCER HISTORY: From the original consult note:  "Brandy Jackson is a 65 y.o. female. Patient palpated a right breast mass. She had a mammogram performed and she was noted to have a mass in the upper outer quadrant measuring 2.1 x 2.0 x 1.7 cm. 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 Tumor was ER positive PR positive HER-2/neu positive (triple positive) with a proliferation marker Ki-67 at 90%. She had MRI of the breasts performed which showed a solitary mass in the upper outer quadrant measuring 2.5 cm. Her case was discussed at the multidisciplinary breast conference."  Her subsequent treatment is as detailed below.   INTERVAL HISTORY:  Brandy Jackson  Returns today or follow-up of her estrogen receptor positve breast caner accompanied by herhusband. [facility] she was tarted on letrozole. She finds "thispill was he worst of all of tem'.Whens take that he cnot wak.. If she takes the evening she cannot walk the next day. She stopped the medication and she now feels like she is back to norml.  REVIEW OF SYSTEMS:  Brandy Jackson was started on prednisone as wlll as methotreate for her psoriatic arthritis. The prednisone has shot up her sugar to greater than 200 and she has stopped that medication. She sleeps poorly. This is not a new problem. She has pain in the right arm area and this worries her. She describes the pain as constant. She has some hearing loss. She has had a little bit of rectal bleeding and had what sounds like a proctoscopy at the [facility] which showed some her hemorrhoids. She is very bothered by her psoriatic arthritis. It is quite painful. She is having hot flashes. Aside from these issues a detailed review of systems  today was noncontributory  PAST MEDICAL HISTORY: Past Medical History  Diagnosis Date  . Diabetes mellitus without complication ([place])   . Psoriatic arthritis ([place])   . Hot flashes   . Cancer ([place])   . Breast cancer ([place])   . Wears glasses   . Wears partial dentures     bottom  . Radiation 10/18/14-12/07/14    Invasive ductal carcinoma    PAST SURGICAL HISTORY: Past Surgical History  Procedure Laterality Date  . Bladder repair w/ cesarean section    . Colonoscopy    . Portacath placement N/A 04/11/2014    Procedure: INSERTION PORT-A-CATH;  Surgeon: Joyice Faster. Cornett, MD;  Location: [place];  Service: General;  Laterality: N/A;  . Re-excision of breast lumpectomy Right 06/06/2014    Procedure: RE-EXCISION OF BREAST LUMPECTOMY;  Surgeon: Marcello Moores A. Cornett, MD;  Location: [place];  Service: General;  Laterality: Right;    FAMILY HISTORY No family history on file.  GYNECOLOGIC HISTORY: menarche at age 81, G59 P3, went through menopause at age 76.  No h/o of HRT, no h/o abnormal pap smears, or sexually transmitted infections.  Does have frequent vaginal candida infections due to diabetes that she follows with her gynecologist for.    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 Her husband [name] is retired from [employer]. He is a cancer survivor himself. The patient has 3 sons, one  of whom was studying to be a pediatrician But now has switched to health management. All her children are married. She has 6 grandchildren.    ADVANCED DIRECTIVES: not in place.    HEALTH MAINTENANCE: Social History  Substance Use Topics  . Smoking status: Former Smoker -- 1.00 packs/day    Quit date: 03/08/1986  . Smokeless tobacco: Never Used  . Alcohol Use: No     Colonoscopy: Bone Density Scan: Obtained 12/21/2014 shows a normal T score Pap Smear:  Lipid Panel:    Allergies   Allergen Reactions  . Sulfa Antibiotics Rash    Current Outpatient Prescriptions  Medication Sig Dispense Refill  . calcipotriene (DOVONOX) 0.005 % cream Apply 1 application topically 2 (two) times daily.    . cholecalciferol (VITAMIN D) 1000 UNITS tablet Take 1,000 Units by mouth daily.    . clobetasol (TEMOVATE) 0.05 % external solution Apply 1 application topically 2 (two) times daily. 50 mL 0  . diclofenac (VOLTAREN) 75 MG EC tablet Take 1 tablet (75 mg total) by mouth 2 (two) times daily as needed.    Marland Kitchen ketoconazole (NIZORAL) 2 % cream Apply 1 application topically daily. 30 g 0  . letrozole (FEMARA) 2.5 MG tablet Take 1 tablet (2.5 mg total) by mouth daily. 30 tablet 4  . metFORMIN (GLUCOPHAGE) 500 MG tablet Take 500 mg by mouth 2 (two) times daily with a meal.    . Omega 3 340 MG CPDR Take 340 mg by mouth daily.    . Pyridoxine HCl (VITAMIN B-6) 250 MG tablet Take 250 mg by mouth daily.    . traMADol (ULTRAM) 50 MG tablet Take 1 tablet (50 mg total) by mouth every 6 (six) hours as needed. 60 tablet 2  . triamcinolone cream (KENALOG) 0.1 % Apply 1 application topically 2 (two) times daily. Reported on 01/10/2016  0  . vitamin B-12 (CYANOCOBALAMIN) 500 MCG tablet Take 500 mcg by mouth daily.    . vitamin E 400 UNIT capsule Take 400 Units by mouth daily.     No current facility-administered medications for this visit.    OBJECTIVE: Middle-aged African American woman  Filed Vitals:   04/17/16 1551  BP: 133/62  Pulse: 86  Temp: 98.6 F (37 C)  Resp: 18     Body mass index is 30.38 kg/(m^2).      ECOG FS:1  Sclerae unicteric, pupils round and equal Oropharynx clear and moist-- no thrush or other lesions No cervical or supraclavicular adenopathy Lungs no rales or rhonchi Heart regular rate and rhythm Abd soft,Obese, nontender, positive bowel sounds MSK no focal spinal tenderness, no upper extremity lymphedema Neuro: nonfocal, well oriented, appropriate affect Breasts: The  right breast is status post lumpectomy and radiation. In the lateral aspect of the breast where she is having some pain, there is some scar tissue but no evidence of local recurrence. The right axilla is otherwise benign. The left breast is unremarkable    LAB RESULTS:  CMP     Component Value Date/Time   NA 140 04/17/2016 1532   NA 142 04/07/2014 1320   K 3.8 04/17/2016 1532   K 4.2 04/07/2014 1320   CL 103 04/07/2014 1320   CO2 26 04/17/2016 1532   CO2 25 04/07/2014 1320   GLUCOSE 176* 04/17/2016 1532   GLUCOSE 126* 04/07/2014 1320   BUN 16.8 04/17/2016 1532   BUN 16 04/07/2014 1320   CREATININE 0.8 04/17/2016 1532   CREATININE 0.56 04/07/2014 1320   CALCIUM  9.7 04/17/2016 1532   CALCIUM 9.4 04/07/2014 1320   PROT 7.4 04/17/2016 1532   PROT 7.8 04/07/2014 1320   ALBUMIN 3.7 04/17/2016 1532   ALBUMIN 3.9 04/07/2014 1320   AST 15 04/17/2016 1532   AST 20 04/07/2014 1320   ALT 21 04/17/2016 1532   ALT 19 04/07/2014 1320   ALKPHOS 70 04/17/2016 1532   ALKPHOS 68 04/07/2014 1320   BILITOT <0.30 04/17/2016 1532   BILITOT 0.2* 04/07/2014 1320   GFRNONAA >90 04/07/2014 1320   GFRAA >90 04/07/2014 1320    I No results found for: SPEP  Lab Results  Component Value Date   WBC 7.2 04/17/2016   NEUTROABS 4.6 04/17/2016   HGB 11.1* 04/17/2016   HCT 34.3* 04/17/2016   MCV 77.6* 04/17/2016   PLT 266 04/17/2016      Chemistry      Component Value Date/Time   NA 140 04/17/2016 1532   NA 142 04/07/2014 1320   K 3.8 04/17/2016 1532   K 4.2 04/07/2014 1320   CL 103 04/07/2014 1320   CO2 26 04/17/2016 1532   CO2 25 04/07/2014 1320   BUN 16.8 04/17/2016 1532   BUN 16 04/07/2014 1320   CREATININE 0.8 04/17/2016 1532   CREATININE 0.56 04/07/2014 1320      Component Value Date/Time   CALCIUM 9.7 04/17/2016 1532   CALCIUM 9.4 04/07/2014 1320   ALKPHOS 70 04/17/2016 1532   ALKPHOS 68 04/07/2014 1320   AST 15 04/17/2016 1532   AST 20 04/07/2014 1320   ALT 21 04/17/2016  1532   ALT 19 04/07/2014 1320   BILITOT <0.30 04/17/2016 1532   BILITOT 0.2* 04/07/2014 1320       No results found for: LABCA2  No components found for: LABCA125  No results for input(s): INR in the last 168 hours.  Urinalysis No results found for: COLORURINE  STUDIES: No results found.  ASSESSMENT: 65 y.o. [place],woman status post right lumpectomy and sentinel lymph node sampling 04/11/2014 for a pT2 pN1, stage IIB invasive ductal carcinoma, grade 3, estrogen receptor strongly positive, progesterone receptor negative, with an MIB-1 of 94% and HER-2 amplification  (1) additional surgery in 06/06/2014 for margin clearance showed no evidence of residual malignancy  (2) received the first of six planned cycles of adjuvant chemotherapy with carboplatin docetaxel and trastuzumab 05/02/2014, with chemotherapy interrupted because of her intervening surgery; chemo resumed 06/23/2014, completing 6 cycles 09/15/2014  (3) a year of trastuzumab completed 04/13/15  (a) most recent echo 03/19/2015 showed a well preserved ejection fraction.  (4) adjuvant radiation completed 12/07/2014 Right breast (high tangents)/ 50.4 Gray @ 1.8 Gray per fraction x 28 fractions Right breast boost / 10 Gray at [employer] per fraction x 5 fractions  (5) anastrozole started 01/19/2015. Stopped after 1 week because of mood changes.  Restarted 05/09/2015, discontinued after 7 weeks with arthralgias myalgias  (6) started tamoxifen 07/31/2015, stopped 09/03/2015 with multiple symptoms  (7) started letrozole 01/11/2016, discontinued April 2017 with significant side effects.  PLAN:  Nashonda he has not been able to tolerate 3 of the four available anti-estrogens. I think if we tried exemestane at this point we would not be successful. A better plan is to wait a few months, let's things settle down, and then give exemestane trial. She is agreeable to this.  I gave her a copy of her CBCs which show a steadily  declining MCV. This means your red cells are getting smaller and the reason for that  is usually low iron and the general cause alarm his bleeding. She does have some hemorrhoidal bleeding but there may be more bleeding somewhere else and this really does need to be evaluated. Her husband is already scheduled for colonoscopy and she can discuss this with that physician. She will need to be there in any case to drive him home. I gave her a copy of this with the notes or she can share it with them.  Otherwise she will return to see me in 6 months. She knows to call for any problems that may develop before then.  Chauncey Cruel, MD 04/17/2016 4:20 PM

## 2016-08-14 DIAGNOSIS — M069 Rheumatoid arthritis, unspecified: Secondary | ICD-10-CM | POA: Insufficient documentation

## 2016-08-14 HISTORY — DX: Rheumatoid arthritis, unspecified: M06.9

## 2016-09-18 ENCOUNTER — Ambulatory Visit: Payer: Self-pay | Admitting: Podiatry

## 2016-10-02 ENCOUNTER — Other Ambulatory Visit: Payer: Self-pay | Admitting: *Deleted

## 2016-10-02 ENCOUNTER — Encounter (INDEPENDENT_AMBULATORY_CARE_PROVIDER_SITE_OTHER): Payer: No Typology Code available for payment source | Admitting: Podiatry

## 2016-10-02 NOTE — Progress Notes (Signed)
   Subjective:    Patient ID: Brandy Jackson, female    DOB: 1951-09-02, 65 y.o.   MRN: [id number]  HPI    Review of Systems  All other systems reviewed and are negative.      Objective:   Physical Exam        Assessment & Plan:    This encounter was created in error - please disregard.

## 2016-10-21 ENCOUNTER — Other Ambulatory Visit: Payer: BLUE CROSS/BLUE SHIELD

## 2016-10-21 ENCOUNTER — Ambulatory Visit: Payer: BLUE CROSS/BLUE SHIELD | Admitting: Oncology

## 2016-10-22 ENCOUNTER — Encounter: Payer: Self-pay | Admitting: Oncology

## 2016-11-07 ENCOUNTER — Telehealth: Payer: Self-pay | Admitting: *Deleted

## 2016-11-07 ENCOUNTER — Other Ambulatory Visit: Payer: Self-pay | Admitting: *Deleted

## 2016-11-07 MED ORDER — EXEMESTANE 25 MG PO TABS: 25.0000 mg | ORAL_TABLET | Freq: Every day | ORAL | 12 refills | Status: DC

## 2016-11-07 NOTE — Telephone Encounter (Signed)
This RN received call from pt stating she was to be seen by Dr Jannifer Rodney in November to get a new anti estrogen prescription.  " but I have had to cancel due to I lost my job and do not have insurance at this time "  " I have [facility] benefits so I will be going there for my medical care and would like a prescription to take with me. "  " I had planned on retiring in 6 months and proceed to medicare and will reschedule my appointment with Dr Jannifer Rodney when I have it "  This RN reviewed last dictation and noted MD wanted to hold antiestrogens until November visit due to intolerance with plan to start exemestane at this time.  This RN discussed above with pt including prescription will be mailed to her with last MD dictation for her to take to her visit at the [facility] to establish care,  This RN also discussed possible resources per our hospital facility that may benefit her at this time with request to send her information to our Managed Care Dept. De[place] stated she would appreciate the above action.  This note will be sent to MD and to Managed Care for communication as well as contacting pt for financial concerns.

## 2016-11-12 ENCOUNTER — Encounter: Payer: Self-pay | Admitting: Oncology

## 2016-11-12 NOTE — Progress Notes (Signed)
Called pt regarding assistance for Aromasin.  Pt stated she's going to take the Rx to the [place] to see if they will cover it if not she will call me back.

## 2016-12-13 ENCOUNTER — Other Ambulatory Visit: Payer: Self-pay | Admitting: Nurse Practitioner

## 2016-12-30 ENCOUNTER — Ambulatory Visit (INDEPENDENT_AMBULATORY_CARE_PROVIDER_SITE_OTHER): Payer: No Typology Code available for payment source

## 2016-12-30 ENCOUNTER — Other Ambulatory Visit: Payer: Self-pay | Admitting: Podiatry

## 2016-12-30 ENCOUNTER — Ambulatory Visit (INDEPENDENT_AMBULATORY_CARE_PROVIDER_SITE_OTHER): Payer: No Typology Code available for payment source | Admitting: Podiatry

## 2016-12-30 ENCOUNTER — Encounter: Payer: Self-pay | Admitting: Podiatry

## 2016-12-30 VITALS — BP 132/75 | HR 81 | Resp 16 | Ht 66.0 in | Wt 187.0 lb

## 2016-12-30 DIAGNOSIS — M2010 Hallux valgus (acquired), unspecified foot: Secondary | ICD-10-CM

## 2016-12-30 DIAGNOSIS — M79672 Pain in left foot: Secondary | ICD-10-CM | POA: Diagnosis not present

## 2016-12-30 DIAGNOSIS — M79671 Pain in right foot: Secondary | ICD-10-CM

## 2016-12-30 DIAGNOSIS — L603 Nail dystrophy: Secondary | ICD-10-CM | POA: Diagnosis not present

## 2016-12-30 NOTE — Progress Notes (Signed)
   Subjective:    Patient ID: Brandy Jackson, female    DOB: 09/14/1951, 66 y.o.   MRN: [id number]  HPI she presents today as a new patient with a chief complaint of pain to the bilateral foot and thickening of the toenails. She states that she has arthritis in both feet with her feet being flat they're painful on the top of the feet. She also relates that she has had breast cancer in the past and is has a chemotherapy which resulted in discoloration of her toenails and changes in her nail plates. She states that she has tried terbinafine but never finished the medication. She is complaining of painful first metatarsophalangeal joints bilaterally.    Review of Systems  All other systems reviewed and are negative.      Objective:   Physical Exam: Vital signs are stable she is alert and oriented 3. Pulses are palpable. Neurologic sensorium is intact deep tendon reflexes are intact muscle strength appears to be normal. Pes planus with hallux abductovalgus deformity and pain on frontal plane range of motion particular Lisfranc's joints associated with palpable arthritis and spurring dorsally. 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 At this point I feel it is necessary to discuss surgical intervention so we discussed that today. Cutaneous evaluation doesn't demonstrate thickening of the hallux nails with discoloration I took a sample of nails and sent for pathologic evaluation.        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  Plan: Discussed etiology pathology conservative versus surgical therapies. Discussed pros and cons of surgery today. She will follow up with me in a month for consult. Took samples of the nails today to be sent for pathologic  evaluation to evaluate for fungus or dermatophytic growth. We'll follow up with her once the report has right.

## 2017-01-27 ENCOUNTER — Ambulatory Visit (INDEPENDENT_AMBULATORY_CARE_PROVIDER_SITE_OTHER): Payer: No Typology Code available for payment source | Admitting: Podiatry

## 2017-01-27 ENCOUNTER — Encounter: Payer: Self-pay | Admitting: Podiatry

## 2017-01-27 DIAGNOSIS — Z79899 Other long term (current) drug therapy: Secondary | ICD-10-CM

## 2017-01-27 DIAGNOSIS — L603 Nail dystrophy: Secondary | ICD-10-CM | POA: Diagnosis not present

## 2017-01-27 DIAGNOSIS — M2010 Hallux valgus (acquired), unspecified foot: Secondary | ICD-10-CM

## 2017-01-27 NOTE — Patient Instructions (Addendum)

## 2017-01-28 NOTE — Progress Notes (Signed)
She presents today for follow-up of her pathology reports from her toenail samples taken last visit. She denies any changes. Let to have her nails trimmed.  Objective: Vital signs are stable alert and oriented 3. Pulses are palpable. Pathology report does demonstrate nail dystrophy as opposed to nail infection.  Assessment: Pain in limb secondary to onychomycosis and nail dystrophy..  Plan: Debridement of toenails bilateral.

## 2017-04-28 ENCOUNTER — Ambulatory Visit: Payer: No Typology Code available for payment source | Admitting: Podiatry

## 2017-05-12 ENCOUNTER — Encounter: Payer: Self-pay | Admitting: Podiatry

## 2017-05-12 ENCOUNTER — Ambulatory Visit (INDEPENDENT_AMBULATORY_CARE_PROVIDER_SITE_OTHER): Payer: No Typology Code available for payment source | Admitting: Podiatry

## 2017-05-12 DIAGNOSIS — M79676 Pain in unspecified toe(s): Secondary | ICD-10-CM

## 2017-05-12 DIAGNOSIS — B351 Tinea unguium: Secondary | ICD-10-CM | POA: Diagnosis not present

## 2017-05-12 DIAGNOSIS — Q6689 Other  specified congenital deformities of feet: Secondary | ICD-10-CM | POA: Diagnosis not present

## 2017-05-13 NOTE — Progress Notes (Signed)
She presents today with chief complaint of painful elongated toenails and corns and calluses.  Objective: Vital signs are stable alert and oriented 3. Pulses are palpable. Neurologic sensorium is intact. Deep tendon reflexes are intact. Muscle strength is normal. Orthopedic evaluation of his result is full range of motion. Porokeratotic lesion plantar aspect of the medial longitudinal arch and the medial first metatarsophalangeal joint hallux. She also has painful elongated toenails bilateral.  Assessment: Porokeratosis plantar aspect of bilateral foot and painful mycotic nails.  Plan: Debridement of all reactive hyperkeratotic tissue and debridement of nails bilateral.

## 2017-08-20 ENCOUNTER — Encounter: Payer: Self-pay | Admitting: Podiatry

## 2017-08-20 ENCOUNTER — Ambulatory Visit (INDEPENDENT_AMBULATORY_CARE_PROVIDER_SITE_OTHER): Payer: No Typology Code available for payment source | Admitting: Podiatry

## 2017-08-20 DIAGNOSIS — Q828 Other specified congenital malformations of skin: Secondary | ICD-10-CM

## 2017-08-20 DIAGNOSIS — B351 Tinea unguium: Secondary | ICD-10-CM | POA: Diagnosis not present

## 2017-08-20 DIAGNOSIS — M79676 Pain in unspecified toe(s): Secondary | ICD-10-CM

## 2017-08-20 NOTE — Progress Notes (Signed)
She presents today with chief complaint of painful elongated toenails toes 1 through 5 of the bilateral foot. She states they're painful when walking and with shoe gear.  Objective: Pulses are palpable no open lesions or wounds. Neurologic sensorium is intact. Deep tendon reflexes are intact. Muscle strength is normal symmetrical bilateral. Cutaneous evaluation does demonstrate thick yellow dystrophic onychomycotic nails sharp rated nail margins for palpation as well as debridement.  Assessment: Pain in limb secondary to onychomycosis and ingrown nails.  Plan: Debridement of toenails 1 through 5 bilaterally is covered service secondary to pain.

## 2017-10-04 ENCOUNTER — Encounter (HOSPITAL_COMMUNITY): Payer: Self-pay

## 2017-10-04 ENCOUNTER — Emergency Department (HOSPITAL_COMMUNITY): Payer: Non-veteran care

## 2017-10-04 ENCOUNTER — Observation Stay (HOSPITAL_COMMUNITY)
Admission: EM | Admit: 2017-10-04 | Discharge: 2017-10-05 | Disposition: A | Discharge status: Home / Self Care | Payer: Non-veteran care | Attending: Internal Medicine | Admitting: Internal Medicine

## 2017-10-04 DIAGNOSIS — I5033 Acute on chronic diastolic (congestive) heart failure: Secondary | ICD-10-CM | POA: Diagnosis not present

## 2017-10-04 DIAGNOSIS — R7611 Nonspecific reaction to tuberculin skin test without active tuberculosis: Secondary | ICD-10-CM

## 2017-10-04 DIAGNOSIS — E559 Vitamin D deficiency, unspecified: Secondary | ICD-10-CM | POA: Insufficient documentation

## 2017-10-04 DIAGNOSIS — I422 Other hypertrophic cardiomyopathy: Secondary | ICD-10-CM | POA: Diagnosis not present

## 2017-10-04 DIAGNOSIS — E118 Type 2 diabetes mellitus with unspecified complications: Secondary | ICD-10-CM | POA: Diagnosis not present

## 2017-10-04 DIAGNOSIS — Z227 Latent tuberculosis: Secondary | ICD-10-CM

## 2017-10-04 DIAGNOSIS — C50411 Malignant neoplasm of upper-outer quadrant of right female breast: Secondary | ICD-10-CM | POA: Diagnosis not present

## 2017-10-04 DIAGNOSIS — I700 Atherosclerosis of aorta: Secondary | ICD-10-CM | POA: Insufficient documentation

## 2017-10-04 DIAGNOSIS — Z7982 Long term (current) use of aspirin: Secondary | ICD-10-CM | POA: Insufficient documentation

## 2017-10-04 DIAGNOSIS — Z79899 Other long term (current) drug therapy: Secondary | ICD-10-CM | POA: Diagnosis not present

## 2017-10-04 DIAGNOSIS — R0789 Other chest pain: Secondary | ICD-10-CM | POA: Diagnosis not present

## 2017-10-04 DIAGNOSIS — M545 Low back pain: Secondary | ICD-10-CM | POA: Insufficient documentation

## 2017-10-04 DIAGNOSIS — M069 Rheumatoid arthritis, unspecified: Secondary | ICD-10-CM | POA: Diagnosis not present

## 2017-10-04 DIAGNOSIS — R0609 Other forms of dyspnea: Secondary | ICD-10-CM

## 2017-10-04 DIAGNOSIS — I5032 Chronic diastolic (congestive) heart failure: Secondary | ICD-10-CM | POA: Diagnosis present

## 2017-10-04 DIAGNOSIS — W19XXXA Unspecified fall, initial encounter: Secondary | ICD-10-CM

## 2017-10-04 DIAGNOSIS — Z87891 Personal history of nicotine dependence: Secondary | ICD-10-CM | POA: Diagnosis not present

## 2017-10-04 DIAGNOSIS — Z7984 Long term (current) use of oral hypoglycemic drugs: Secondary | ICD-10-CM | POA: Insufficient documentation

## 2017-10-04 DIAGNOSIS — E119 Type 2 diabetes mellitus without complications: Secondary | ICD-10-CM | POA: Diagnosis not present

## 2017-10-04 HISTORY — DX: Other hypertrophic cardiomyopathy: I42.2

## 2017-10-04 HISTORY — DX: Latent tuberculosis: Z22.7

## 2017-10-04 HISTORY — DX: Acute on chronic diastolic (congestive) heart failure: I50.33

## 2017-10-04 LAB — BASIC METABOLIC PANEL
Anion gap: 14 (ref 5–15)
BUN: 14 mg/dL (ref 6–20)
CO2: 20 mmol/L — ABNORMAL LOW (ref 22–32)
CREATININE: 0.55 mg/dL (ref 0.44–1.00)
Calcium: 9.4 mg/dL (ref 8.9–10.3)
Chloride: 102 mmol/L (ref 101–111)
GFR calc Af Amer: >60 mL/min (ref >60)
GLUCOSE: 289 mg/dL — AB (ref 65–99)
Potassium: 3.9 mmol/L (ref 3.5–5.1)
Sodium: 136 mmol/L (ref 135–145)

## 2017-10-04 LAB — I-STAT TROPONIN, ED
Troponin i, poc: 0.01 ng/mL (ref 0.00–0.08)
Troponin i, poc: 0 ng/mL (ref 0.00–0.08)

## 2017-10-04 LAB — CBC
HCT: 38.3 % (ref 36.0–46.0)
Hemoglobin: 12.5 g/dL (ref 12.0–15.0)
MCH: 27.7 pg (ref 26.0–34.0)
MCHC: 32.6 g/dL (ref 30.0–36.0)
MCV: 84.9 fL (ref 78.0–100.0)
PLATELETS: 296 10*3/uL (ref 150–400)
RBC: 4.51 MIL/uL (ref 3.87–5.11)
RDW: 15.7 % — AB (ref 11.5–15.5)
WBC: 10.3 10*3/uL (ref 4.0–10.5)

## 2017-10-04 LAB — D-DIMER, QUANTITATIVE (NOT AT ARMC): D DIMER QUANT: 0.86 ug{FEU}/mL — AB (ref 0.00–0.50)

## 2017-10-04 LAB — BRAIN NATRIURETIC PEPTIDE: B Natriuretic Peptide: 16.2 pg/mL (ref 0.0–100.0)

## 2017-10-04 MED ORDER — ENOXAPARIN SODIUM 40 MG/0.4ML ~~LOC~~ SOLN
40.0000 mg | Freq: Every day | SUBCUTANEOUS | Status: DC
  Administered 2017-10-05: 40 mg via SUBCUTANEOUS
  Filled 2017-10-04: qty 0.4

## 2017-10-04 MED ORDER — FUROSEMIDE 20 MG PO TABS
20.0000 mg | ORAL_TABLET | Freq: Once | ORAL | Status: AC
  Administered 2017-10-04: 20 mg via ORAL
  Filled 2017-10-04: qty 1

## 2017-10-04 MED ORDER — FOLIC ACID 1 MG PO TABS
1.0000 mg | ORAL_TABLET | ORAL | Status: DC
  Administered 2017-10-05: 1 mg via ORAL
  Filled 2017-10-04: qty 1

## 2017-10-04 MED ORDER — INSULIN ASPART 100 UNIT/ML ~~LOC~~ SOLN
0.0000 [IU] | Freq: Three times a day (TID) | SUBCUTANEOUS | Status: DC
  Administered 2017-10-05: 5 [IU] via SUBCUTANEOUS
  Administered 2017-10-05 (×2): 7 [IU] via SUBCUTANEOUS

## 2017-10-04 MED ORDER — SODIUM CHLORIDE 0.9% FLUSH: 3.0000 mL | INTRAVENOUS | Status: DC | PRN

## 2017-10-04 MED ORDER — TRAMADOL HCL 50 MG PO TABS
50.0000 mg | ORAL_TABLET | Freq: Four times a day (QID) | ORAL | Status: DC | PRN
  Administered 2017-10-05: 50 mg via ORAL
  Filled 2017-10-04: qty 1

## 2017-10-04 MED ORDER — KETOROLAC TROMETHAMINE 10 MG PO TABS
10.0000 mg | ORAL_TABLET | Freq: Once | ORAL | Status: AC
  Administered 2017-10-04: 10 mg via ORAL
  Filled 2017-10-04: qty 1

## 2017-10-04 MED ORDER — DICLOFENAC SODIUM 1 % TD GEL: 1.0000 "application " | Freq: Two times a day (BID) | TRANSDERMAL | Status: DC | PRN

## 2017-10-04 MED ORDER — SODIUM CHLORIDE 0.9% FLUSH
3.0000 mL | Freq: Two times a day (BID) | INTRAVENOUS | Status: DC
  Administered 2017-10-05 (×2): 3 mL via INTRAVENOUS

## 2017-10-04 MED ORDER — SODIUM CHLORIDE 0.9 % IV SOLN: 250.0000 mL | INTRAVENOUS | Status: DC | PRN

## 2017-10-04 MED ORDER — ACETAMINOPHEN 325 MG PO TABS: 650.0000 mg | ORAL_TABLET | ORAL | Status: DC | PRN

## 2017-10-04 MED ORDER — INSULIN ASPART 100 UNIT/ML ~~LOC~~ SOLN
0.0000 [IU] | Freq: Every day | SUBCUTANEOUS | Status: DC
  Administered 2017-10-05: 3 [IU] via SUBCUTANEOUS

## 2017-10-04 MED ORDER — IOPAMIDOL (ISOVUE-370) INJECTION 76%
INTRAVENOUS | Status: AC
  Administered 2017-10-04: 100 mL
  Filled 2017-10-04: qty 100

## 2017-10-04 MED ORDER — KETOROLAC TROMETHAMINE 30 MG/ML IJ SOLN: 15.0000 mg | Freq: Once | INTRAMUSCULAR | Status: DC

## 2017-10-04 MED ORDER — TAMOXIFEN CITRATE 10 MG PO TABS
10.0000 mg | ORAL_TABLET | Freq: Every day | ORAL | Status: DC
  Administered 2017-10-05: 10 mg via ORAL
  Filled 2017-10-04: qty 1

## 2017-10-04 MED ORDER — VITAMIN D 1000 UNITS PO TABS
1000.0000 [IU] | ORAL_TABLET | Freq: Every day | ORAL | Status: DC
  Administered 2017-10-05: 1000 [IU] via ORAL
  Filled 2017-10-04: qty 1

## 2017-10-04 MED ORDER — NAPROXEN 250 MG PO TABS
500.0000 mg | ORAL_TABLET | Freq: Every day | ORAL | Status: DC | PRN
  Filled 2017-10-04: qty 2

## 2017-10-04 MED ORDER — PREDNISONE 5 MG PO TABS
5.0000 mg | ORAL_TABLET | Freq: Every day | ORAL | Status: DC
Start: 2017-10-05 — End: 2017-10-05
  Administered 2017-10-05: 5 mg via ORAL
  Filled 2017-10-04: qty 1

## 2017-10-04 MED ORDER — FUROSEMIDE 10 MG/ML IJ SOLN
20.0000 mg | Freq: Two times a day (BID) | INTRAMUSCULAR | Status: DC
  Administered 2017-10-05: 20 mg via INTRAVENOUS
  Filled 2017-10-04: qty 2

## 2017-10-04 MED ORDER — ONDANSETRON HCL 4 MG/2ML IJ SOLN
4.0000 mg | Freq: Four times a day (QID) | INTRAMUSCULAR | Status: DC | PRN
Start: 2017-10-04 — End: 2017-10-05

## 2017-10-04 MED ORDER — VITAMIN B-6 50 MG PO TABS
50.0000 mg | ORAL_TABLET | Freq: Every day | ORAL | Status: DC
  Administered 2017-10-05: 50 mg via ORAL
  Filled 2017-10-04: qty 1

## 2017-10-04 MED ORDER — ISONIAZID 300 MG PO TABS
300.0000 mg | ORAL_TABLET | Freq: Every day | ORAL | Status: DC
  Administered 2017-10-05: 300 mg via ORAL
  Filled 2017-10-04: qty 1

## 2017-10-04 MED ORDER — ASPIRIN 81 MG PO CHEW
324.0000 mg | CHEWABLE_TABLET | Freq: Once | ORAL | Status: AC
  Administered 2017-10-04: 324 mg via ORAL
  Filled 2017-10-04: qty 4

## 2017-10-04 NOTE — ED Triage Notes (Signed)
Patient complains of 2-3 days of chest tightness, SOB, and right leg pain from hip to foot., states that she fell in bath tub several days ago but had pain prior to fall. States that her heart feels like it is pounding with any exertion

## 2017-10-04 NOTE — ED Provider Notes (Signed)
Brandy Jackson   CSN: [id number] Arrival date & time: 10/04/17  1435     History   Chief Complaint Chief Complaint  Patient presents with  . Chest Pain/SOB/ hip pain    HPI    Blood pressure 129/83, pulse (!) 106, temperature 98.4 F (36.9 C), temperature source Oral, resp. rate 18, SpO2 92 %.  Brandy Jackson is a 66 y.o. female with past medical history significant for non-insulin-dependent diabetes, breast cancer (last mammogram in September 2018 clear) complaining of a right low back pain radiating down the posterior right gluteus and right leg onset over a week ago.  She states she was in her shower approximately 6 days ago and her foot came into contact with soap she slipped and fell onto her face.  There was no loss of consciousness, she is not anticoagulated.  She states that starting today after she took a tramadol for pain she had palpitations, shortness of breath, dyspnea on exertion.  She has a dry cough over the last several weeks.  She has not noticed any peripheral edema she has no history of DVT/PE, recent surgeries but she travels regularly to [place] to visit family.  She denies any orthopnea, PND.  She states that she has a history of hypertrophic obstructive cardiomyopathy and congestive heart failure.  She does not take any of her medication as prescribed (metoprolol and lisinopril because she feels like it would stop her from being active. FH of ACS in 63's  Past Medical History:  Diagnosis Date  . Breast cancer ([place])   . Cancer ([place])   . Diabetes mellitus without complication ([place])   . Hot flashes   . Psoriatic arthritis ([place])   . 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 Wears glasses   . Wears partial dentures    bottom    Patient Active Problem List   Diagnosis Date Noted  . Hypertrophic cardiomyopathy ([place]) 10/04/2017  . Acute on chronic diastolic CHF (congestive heart failure) ([place])  10/04/2017  . 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 Psoriatic arthritis ([place]) 07/31/2015  . Vitamin D deficiency 04/13/2015  . Mood changes 03/16/2015  . 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 Bilateral lower extremity edema 09/15/2014  . Malfunction of device 08/28/2014  . Laryngitis 08/18/2014  . Type II diabetes mellitus with manifestations ([place]) 08/04/2014  . 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 Diarrhea 08/04/2014  . Fatigue due to treatment 08/04/2014  . Rectal bleed 06/30/2014  . Post-operative state 04/24/2014  . Breast cancer of upper-outer quadrant of right female breast ([place]) 03/03/2014    Past Surgical History:  Procedure Laterality Date  . BLADDER REPAIR W/ CESAREAN SECTION    . COLONOSCOPY    . PORTACATH PLACEMENT N/A 04/11/2014   Procedure: INSERTION PORT-A-CATH;  Surgeon: Joyice Faster. Cornett, MD;  Location: [facility];  Service: General;  Laterality: N/A;  . RE-EXCISION OF BREAST LUMPECTOMY Right 06/06/2014   Procedure: RE-EXCISION OF BREAST LUMPECTOMY;  Surgeon: Marcello Moores A. Cornett, MD;  Location: [facility];  Service: General;  Laterality: Right;    OB History    Obstetric Comments   Menarche 72 First live  Birth age 46. Has 3 boys LMP age 89 No hormonal  Replacement therapy       Home Medications    Prior to Admission medications   Medication Sig Start Date End Date Taking? Authorizing Provider  calcipotriene (DOVONOX) 0.005 % cream  Apply 1 application topically 2 (two) times daily as needed (itching from psoriasis).    Yes [provider]  cholecalciferol (VITAMIN D) 1000 UNITS tablet Take 1,000 Units by mouth daily.   Yes [provider]  clobetasol (TEMOVATE) 0.05 % external solution Apply 1 application topically 2 (two) times daily. Patient taking differently: Apply 1 application topically 2 (two) times daily as needed (itching from psoriasis).  01/10/16  Yes Boelter, Genelle Gather, NP    Cyanocobalamin (VITAMIN B-12 PO) Take 1 tablet by mouth daily.   Yes [provider]  diclofenac sodium (VOLTAREN) 1 % GEL Apply 1 application topically 2 (two) times daily as needed (neck pain).   Yes [provider]  folic acid (FOLVITE) 1 MG tablet Take 1 mg by mouth See admin instructions. Take 1 tablet (1 mg) by mouth every day except Saturday (do not take on the same day as methotrexate)   Yes [provider]  ISONIAZID PO Take 5 tablets by mouth daily. Started February 26, 2017, end date approx December 07, 2017 (until last bottle is finished   Yes [provider]  ketoconazole (NIZORAL) 2 % cream Apply 1 application topically daily. Patient taking differently: Apply 1 application topically daily as needed (rash under breasts).  01/10/16  Yes Boelter, Genelle Gather, NP  metFORMIN (GLUCOPHAGE) 500 MG tablet Take 500 mg by mouth daily.   Yes [provider]  METHOTREXATE PO Take 4 tablets by mouth See admin instructions. Take 4 tablets by mouth Saturday morning and take 4 tablets Saturday night   Yes [provider]  naproxen (NAPROSYN) 500 MG tablet Take 500 mg by mouth daily as needed (pain).   Yes [provider]  predniSONE (DELTASONE) 5 MG tablet Take 5 mg by mouth daily with breakfast.   Yes [provider]  Pyridoxine HCl (VITAMIN B-6 PO) Take 1 tablet by mouth daily.   Yes [provider]  TAMOXIFEN CITRATE PO Take 1 tablet by mouth daily.   Yes [provider]  traMADol (ULTRAM) 50 MG tablet Take 1 tablet (50 mg total) by mouth every 6 (six) hours as needed. Patient taking differently: Take 50 mg by mouth every 6 (six) hours as needed (pain).  10/10/15  Yes Magrinat, Virgie Dad, MD    Family History History reviewed. No pertinent family history.  Social History Social History  Substance Use Topics  . Smoking status: Former Smoker    Packs/day: 1.00    Quit date: 03/08/1986  . Smokeless tobacco: Never  Used  . Alcohol use No     Allergies   Sulfa antibiotics   Review of Systems Review of Systems  A complete review of systems was obtained and all systems are negative except as noted in the HPI and PMH.   Physical Exam Updated Vital Signs BP 111/74 (BP Location: Right Arm)   Pulse (!) 102   Temp 98.1 F (36.7 C) (Oral)   Resp 20   Ht 5\' 6"  (1.676 m)   Wt 90.3 kg (199 lb)   SpO2 100%   BMI 32.12 kg/m   Physical Exam  Constitutional: She is oriented to person, place, and time. She appears well-developed and well-nourished. No distress.  HENT:  Head: Normocephalic and atraumatic.  Mouth/Throat: Oropharynx is clear and moist.  Eyes: Pupils are equal, round, and reactive to light. Conjunctivae and EOM are normal.  Neck: Normal range of motion. No JVD present. No tracheal deviation present.  Cardiovascular: Normal rate, regular rhythm  and intact distal pulses.   Tachycardic, regular  Pulmonary/Chest: Effort normal and breath sounds normal. No stridor. No respiratory distress. She has no wheezes. She has no rales. She exhibits no tenderness.  Abdominal: Soft. She exhibits no distension and no mass. There is no tenderness. There is no rebound and no guarding. No hernia.  Musculoskeletal: Normal range of motion. She exhibits no edema or tenderness.  No calf asymmetry, superficial collaterals, palpable cords, edema, Homans sign negative bilaterally.    Neurological: She is alert and oriented to person, place, and time.  Skin: Skin is warm. Capillary refill takes less than 2 seconds. She is not diaphoretic.  Psychiatric: She has a normal mood and affect.  Nursing Jackson and vitals reviewed.    ED Treatments / Results  Labs (all labs ordered are listed, but only abnormal results are displayed) Labs Reviewed  BASIC METABOLIC PANEL - Abnormal; Notable for the following:       Result Value   CO2 20 (*)    Glucose, Bld 289 (*)    All other components within normal limits  CBC -  Abnormal; Notable for the following:    RDW 15.7 (*)    All other components within normal limits  D-DIMER, QUANTITATIVE (NOT AT [facility]) - Abnormal; Notable for the following:    D-Dimer, Quant 0.86 (*)    All other components within normal limits  GLUCOSE, CAPILLARY - Abnormal; Notable for the following:    Glucose-Capillary 257 (*)    All other components within normal limits  BRAIN NATRIURETIC PEPTIDE  BASIC METABOLIC PANEL  I-STAT TROPONIN, ED  I-STAT TROPONIN, ED  I-STAT TROPONIN, ED    EKG  EKG Interpretation  Date/Time:  Sunday October 04 2017 14:51:43 EDT Ventricular Rate:  110 PR Interval:  120 QRS Duration: 78 QT Interval:  338 QTC Calculation: 457 R Axis:   12 Text Interpretation:  Sinus tachycardia Possible Left atrial enlargement Borderline ECG SINCE LAST TRACING HEART RATE HAS INCREASED Confirmed by [name] [phone]) on 10/04/2017 9:56:27 PM       Radiology Dg Chest 2 View  Result Date: 10/04/2017 CLINICAL DATA:  Chest pain and shortness of breath for 3 days. EXAM: CHEST  2 VIEW COMPARISON:  None. FINDINGS: The heart size and mediastinal contours are within normal limits. Aortic atherosclerosis. Both lungs are clear. Stable mild elevation of right hemidiaphragm. The visualized skeletal structures are unremarkable. IMPRESSION: Stable exam.  No active cardiopulmonary disease. Electronically Signed   By: Earle Gell M.D.   On: 10/04/2017 16:11   Dg Lumbar Spine Complete  Result Date: 10/04/2017 CLINICAL DATA:  Right hip and lower back pain since fall nearly a week ago. EXAM: LUMBAR SPINE - COMPLETE 4+ VIEW COMPARISON:  None. FINDINGS: No fracture or traumatic malalignment. Multilevel degenerative changes most marked at L5-S1. No other acute abnormalities are noted. IMPRESSION: Degenerative changes.  No fracture or traumatic malalignment. Electronically Signed   By: Dorise Bullion III M.D   On: 10/04/2017 17:32   Ct Angio Chest Pe W And/or Wo  Contrast  Addendum Date: 10/04/2017   ADDENDUM REPORT: 10/04/2017 20:57 ADDENDUM: Focal scarring in the lower right breast consistent with surgical changes Electronically Signed   By: Donavan Foil M.D.   On: 10/04/2017 20:57   Result Date: 10/04/2017 CLINICAL DATA:  Chest tightness with positive D-dimer EXAM: CT ANGIOGRAPHY CHEST WITH CONTRAST TECHNIQUE: Multidetector CT imaging of the chest was performed using the standard protocol during bolus administration of intravenous contrast. Multiplanar CT  image reconstructions and MIPs were obtained to evaluate the vascular anatomy. CONTRAST:  100 mL Isovue 370 intravenous COMPARISON:  10/04/2017 FINDINGS: Cardiovascular: Satisfactory opacification of the pulmonary arteries to the segmental level. No evidence of pulmonary embolism. Aortic atherosclerosis. No dissection. Normal heart size. No large pericardial effusion. Mediastinum/Nodes: Midline trachea. No thyroid mass. No significantly enlarged mediastinal lymph nodes. The esophagus is within normal limits. Lungs/Pleura: Multifocal diffuse bilateral ground-glass density. No pleural effusion. No pneumothorax. Upper Abdomen: No acute abnormality. Musculoskeletal: Degenerative changes. No acute or suspicious bone lesion Review of the MIP images confirms the above findings. IMPRESSION: 1. Negative for acute pulmonary embolus or aortic dissection 2. Moderate diffuse bilateral ground-glass density is suspicious for pulmonary edema. Aortic Atherosclerosis (ICD10-I70.0). Electronically Signed: By: Donavan Foil M.D. On: 10/04/2017 20:38   Dg Hip Unilat With Pelvis 2-3 Views Right  Result Date: 10/04/2017 CLINICAL DATA:  Fall nearly a week ago. EXAM: DG HIP (WITH OR WITHOUT PELVIS) 2-3V RIGHT COMPARISON:  None. FINDINGS: The well corticated calcification is seen adjacent to the greater trochanter, likely nonacute. No acute fracture seen to explain the patient's symptoms. No other acute abnormalities are noted.  IMPRESSION: The well corticated calcification adjacent to the greater trochanter is likely nonacute. No acute fracture seen. Electronically Signed   By: Dorise Bullion III M.D   On: 10/04/2017 17:34    Procedures Procedures (including critical care time)  Medications Ordered in ED Medications  diclofenac sodium (VOLTAREN) 1 % transdermal gel 1 application (not administered)  isoniazid 50 MG/5ML syrup 300 mg (not administered)  naproxen (NAPROSYN) tablet 500 mg (not administered)  predniSONE (DELTASONE) tablet 5 mg (not administered)  pyridOXINE (VITAMIN B-6) tablet 50 mg (not administered)  tamoxifen (NOLVADEX) tablet 10 mg (not administered)  folic acid (FOLVITE) tablet 1 mg (not administered)  traMADol (ULTRAM) tablet 50 mg (not administered)  cholecalciferol (VITAMIN D) tablet 1,000 Units (not administered)  sodium chloride flush (NS) 0.9 % injection 3 mL (3 mLs Intravenous Given 10/05/17 0057)  sodium chloride flush (NS) 0.9 % injection 3 mL (not administered)  0.9 %  sodium chloride infusion (not administered)  acetaminophen (TYLENOL) tablet 650 mg (not administered)  ondansetron (ZOFRAN) injection 4 mg (not administered)  enoxaparin (LOVENOX) injection 40 mg (not administered)  furosemide (LASIX) injection 20 mg (not administered)  insulin aspart (novoLOG) injection 0-9 Units (not administered)  insulin aspart (novoLOG) injection 0-5 Units (3 Units Subcutaneous Given 10/05/17 0054)  ketorolac (TORADOL) tablet 10 mg (10 mg Oral Given 10/04/17 1831)  iopamidol (ISOVUE-370) 76 % injection (100 mLs  Contrast Given 10/04/17 1947)  furosemide (LASIX) tablet 20 mg (20 mg Oral Given 10/04/17 2231)  aspirin chewable tablet 324 mg (324 mg Oral Given 10/04/17 2231)     Initial Impression / Assessment and Plan / ED Course  I have reviewed the triage vital signs and the nursing notes.  Pertinent labs & imaging results that were available during my care of the patient were reviewed by me  and considered in my medical decision making (see chart for details).     Vitals:   10/04/17 2300 10/04/17 2330 10/05/17 0011 10/05/17 0030  BP: (!) 153/73 (!) 165/81 (!) 147/78 111/74  Pulse: 92 91 92 (!) 102  Resp: (!) 25 (!) 24 (!) 22 20  Temp:    98.1 F (36.7 C)  TempSrc:    Oral  SpO2: 93% 96% 94% 100%  Weight:    90.3 kg (199 lb)  Height:    5\' 6"  (1.676  m)    Medications  diclofenac sodium (VOLTAREN) 1 % transdermal gel 1 application (not administered)  isoniazid 50 MG/5ML syrup 300 mg (not administered)  naproxen (NAPROSYN) tablet 500 mg (not administered)  predniSONE (DELTASONE) tablet 5 mg (not administered)  pyridOXINE (VITAMIN B-6) tablet 50 mg (not administered)  tamoxifen (NOLVADEX) tablet 10 mg (not administered)  folic acid (FOLVITE) tablet 1 mg (not administered)  traMADol (ULTRAM) tablet 50 mg (not administered)  cholecalciferol (VITAMIN D) tablet 1,000 Units (not administered)  sodium chloride flush (NS) 0.9 % injection 3 mL (3 mLs Intravenous Given 10/05/17 0057)  sodium chloride flush (NS) 0.9 % injection 3 mL (not administered)  0.9 %  sodium chloride infusion (not administered)  acetaminophen (TYLENOL) tablet 650 mg (not administered)  ondansetron (ZOFRAN) injection 4 mg (not administered)  enoxaparin (LOVENOX) injection 40 mg (not administered)  furosemide (LASIX) injection 20 mg (not administered)  insulin aspart (novoLOG) injection 0-9 Units (not administered)  insulin aspart (novoLOG) injection 0-5 Units (3 Units Subcutaneous Given 10/05/17 0054)  ketorolac (TORADOL) tablet 10 mg (10 mg Oral Given 10/04/17 1831)  iopamidol (ISOVUE-370) 76 % injection (100 mLs  Contrast Given 10/04/17 1947)  furosemide (LASIX) tablet 20 mg (20 mg Oral Given 10/04/17 2231)  aspirin chewable tablet 324 mg (324 mg Oral Given 10/04/17 2231)    SHURONDA SANTINO is 66 y.o. female presenting with low back pain radiating down the right leg, she also has some shortness of  breath and dyspnea on exertion, patient tachycardic on my exam, she has a history of breast cancer but that appears to be in full remission.  There are no fractures or pathologic fracture seen on the x-ray of the lumbar spine of the hip.  D-dimer is elevated, there is been a significant delay in obtaining IV access for this patient she has an upper extremity that is restricted.  EKG with no ischemic findings, initial troponin negative, chest x-ray is without infiltrate or florid pulmonary edema.  CTA without PE however there are groundglass opacities consistent with pulmonary edema.  This is not appear to be florid CHF.  On further discussion with this patient she states that she has a secret and she would like me not to tell anybody that she has hypertrophic obstructive cardiomyopathy and CHF.  She is noncompliant with these medications after reading about them and the fact they might have on her lifestyle.  I have encouraged her to follow closely with her cardiologist on this and that she needs to disclose this to all of her caretakers.  Given her dyspnea on exertion, extensive family history of ACS, age and diabetes and hyperlipidemia will need admission for chest pain rule out.  This is a shared visit with the attending physician who personally evaluated the patient and agrees with the care plan.   Unassigned admission to  hospitalist.   Final Clinical Impressions(s) / ED Diagnoses   Final diagnoses:  DOE (dyspnea on exertion)    New Prescriptions Current Discharge Medication List       Waynetta Pean 10/05/17 0113    [name], MD 10/08/17 [phone]

## 2017-10-04 NOTE — ED Provider Notes (Signed)
Complains of chest tightness and shortness of breath onset today.  Shortness of breath is worse with exertion and improved with rest.  She is presently asymptomatic.Brandy Jackson  She also complains of low back pain radiating to her right foot and numbness in her right foot for 9 days.  She reports she fell in the bathtub several days ago.  She denies cough denies fever.  No other associated symptoms.  On exam no distress ENT exam no facial asymmetry neck supple no JVD no bruit lungs clear to auscultation heart regular rate and rhythm abdomen nondistended nontender all 4 extremities without redness swelling or tenderness neurovascular intact.  The back is without point tenderness.  She has no pain on internal or external rotation of either thigh.   [name], MD 10/04/17 2213

## 2017-10-04 NOTE — H&P (Addendum)
History and Physical    Brandy Jackson ZOX:[id number] DOB: 08-21-1951 DOA: 10/04/2017  PCP: Ricke Hey, MD   Patient coming from: Home  Chief Complaint: SOB, chest tightness, LBP   HPI: Brandy Jackson is a 66 y.o. female with medical history significant for type 2 diabetes mellitus, latent tuberculosis currently under treatment, and recent diagnosis of hypertrophic cardiomyopathy and diastolic heart failure, now presenting to the emergency department with shortness of breath, chest tightness, and low back pain with radiation down the right leg.  Patient reports that she has been having dyspnea with exertion and chest tightness for several weeks and has been evaluated by her cardiologist with the [facility] recently.  She underwent echocardiogram and was diagnosed with hypertrophic cardiomyopathy and chronic diastolic dysfunction.  She was recommended to start losartan and metoprolol, but after reading the package inserts, has not started them.  Her symptoms have been worsening, particularly over the last 2-3 days.  She denies fevers or chills and denies calf tenderness or swelling.  Reports some mild orthopnea.  ED Course: Upon arrival to the ED, patient is found to be afebrile, saturating well on room air, slightly tachycardic, and with stable blood pressure.  EKG features a sinus tachycardia with rate 110 and chest x-ray is negative for acute cardia pulmonary disease.  Lumbar spine radiographs are notable for degenerative changes, but no acute pathology.  Right hip radiographs are negative for fracture.  Chemistry panel is notable for a glucose of 289 and CBC is unremarkable.  Troponin is negative at 0.01, and repeat troponin, more than 6 hours later, is undetectable.  D-dimer is elevated to 0.86 and CTA chest is negative for PE or dissection, but notable for moderate diffuse groundglass densities consistent with edema.  Patient was treated with 324 mg of aspirin, Toradol, and 20 mg oral Lasix.   She remained hemodynamically stable in the ED, is not in acute respiratory distress, and will be observed on the telemetry unit for ongoing evaluation and management of acute on chronic diastolic CHF.  Review of Systems:  All other systems reviewed and apart from HPI, are negative.  Past Medical History:  Diagnosis Date  . Breast cancer ([place])   . Cancer ([place])   . Diabetes mellitus without complication ([place])   . Hot flashes   . Psoriatic arthritis ([place])   . 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 Wears glasses   . Wears partial dentures    bottom    Past Surgical History:  Procedure Laterality Date  . BLADDER REPAIR W/ CESAREAN SECTION    . COLONOSCOPY    . PORTACATH PLACEMENT N/A 04/11/2014   Procedure: INSERTION PORT-A-CATH;  Surgeon: Joyice Faster. Cornett, MD;  Location: [place] [facility];  Service: General;  Laterality: N/A;  . RE-EXCISION OF BREAST LUMPECTOMY Right 06/06/2014   Procedure: RE-EXCISION OF BREAST LUMPECTOMY;  Surgeon: Marcello Moores A. Cornett, MD;  Location: [facility];  Service: General;  Laterality: Right;     reports that she quit smoking about 31 years ago. She smoked 1.00 pack per day. She has never used smokeless tobacco. She reports that she does not drink alcohol or use drugs.  Allergies  Allergen Reactions  . Sulfa Antibiotics Rash    History reviewed. No pertinent family history.   Prior to Admission medications   Medication Sig Start Date End Date Taking? Authorizing Provider  calcipotriene (DOVONOX) 0.005 % cream Apply 1 application topically 2 (two) times daily as needed (itching from  psoriasis).    Yes [provider]  cholecalciferol (VITAMIN D) 1000 UNITS tablet Take 1,000 Units by mouth daily.   Yes [provider]  clobetasol (TEMOVATE) 0.05 % external solution Apply 1 application topically 2 (two) times daily. Patient taking differently: Apply 1 application topically 2 (two) times daily as  needed (itching from psoriasis).  01/10/16  Yes Boelter, Genelle Gather, NP  Cyanocobalamin (VITAMIN B-12 PO) Take 1 tablet by mouth daily.   Yes [provider]  diclofenac sodium (VOLTAREN) 1 % GEL Apply 1 application topically 2 (two) times daily as needed (neck pain).   Yes [provider]  folic acid (FOLVITE) 1 MG tablet Take 1 mg by mouth See admin instructions. Take 1 tablet (1 mg) by mouth every day except Saturday (do not take on the same day as methotrexate)   Yes [provider]  ISONIAZID PO Take 5 tablets by mouth daily. Started February 26, 2017, end date approx December 07, 2017 (until last bottle is finished   Yes [provider]  ketoconazole (NIZORAL) 2 % cream Apply 1 application topically daily. Patient taking differently: Apply 1 application topically daily as needed (rash under breasts).  01/10/16  Yes Boelter, Genelle Gather, NP  metFORMIN (GLUCOPHAGE) 500 MG tablet Take 500 mg by mouth daily.   Yes [provider]  METHOTREXATE PO Take 4 tablets by mouth See admin instructions. Take 4 tablets by mouth Saturday morning and take 4 tablets Saturday night   Yes [provider]  naproxen (NAPROSYN) 500 MG tablet Take 500 mg by mouth daily as needed (pain).   Yes [provider]  predniSONE (DELTASONE) 5 MG tablet Take 5 mg by mouth daily with breakfast.   Yes [provider]  Pyridoxine HCl (VITAMIN B-6 PO) Take 1 tablet by mouth daily.   Yes [provider]  TAMOXIFEN CITRATE PO Take 1 tablet by mouth daily.   Yes [provider]  traMADol (ULTRAM) 50 MG tablet Take 1 tablet (50 mg total) by mouth every 6 (six) hours as needed. Patient taking differently: Take 50 mg by mouth every 6 (six) hours as needed (pain).  10/10/15  Yes Magrinat, Virgie Dad, MD  exemestane (AROMASIN) 25 MG tablet Take 1 tablet (25 mg total) by mouth daily after breakfast. Patient not taking: Reported on 10/04/2017 11/07/16   Chauncey Cruel, MD    Physical Exam: Vitals:   10/04/17 2100 10/04/17 2130 10/04/17 2245 10/04/17 2300  BP: (!) 148/78 132/70 (!) 159/75 (!) 153/73  Pulse: 93 90 93 92  Resp:   (!) 24 (!) 25  Temp:      TempSrc:      SpO2: 97% 97% 97% 93%      Constitutional: NAD, calm  Eyes: PERTLA, lids and conjunctivae normal ENMT: Mucous membranes are moist. Posterior pharynx clear of any exudate or lesions.   Neck: normal, supple, no masses, no thyromegaly Respiratory: Dyspnea with speech, no wheezing, fine rales bilaterally. No accessory muscle use.  Cardiovascular: Rate ~100 and regular. Trace bilateral pretibial edema. JVP not well-visualized. Abdomen: No distension, no tenderness, no masses palpated. Bowel sounds normal.  Musculoskeletal: no clubbing / cyanosis. Left finger deformities.  Skin: no significant rashes, lesions, ulcers. Warm, dry, well-perfused. Neurologic: CN 2-12 grossly intact. Sensation intact. Strength 5/5 in all 4 limbs.  Psychiatric:Alert and oriented x 3. Normal mood and affect.     Labs on Admission: I have personally reviewed following labs and imaging studies  CBC:  Recent Labs Lab 10/04/17 1447  WBC 10.3  HGB 12.5  HCT 38.3  MCV 84.9  PLT 627   Basic Metabolic Panel:  Recent Labs Lab 10/04/17 1447  NA 136  K 3.9  CL 102  CO2 20*  GLUCOSE 289*  BUN 14  CREATININE 0.55  CALCIUM 9.4   GFR: CrCl cannot be calculated (Unknown ideal weight.). Liver Function Tests: No results for input(s): AST, ALT, ALKPHOS, BILITOT, PROT, ALBUMIN in the last 168 hours. No results for input(s): LIPASE, AMYLASE in the last 168 hours. No results for input(s): AMMONIA in the last 168 hours. Coagulation Profile: No results for input(s): INR, PROTIME in the last 168 hours. Cardiac Enzymes: No results for input(s): CKTOTAL, CKMB, CKMBINDEX, TROPONINI in the last 168 hours. BNP (last 3 results) No results for input(s): PROBNP in the last 8760 hours. HbA1C: No results  for input(s): HGBA1C in the last 72 hours. CBG: No results for input(s): GLUCAP in the last 168 hours. Lipid Profile: No results for input(s): CHOL, HDL, LDLCALC, TRIG, CHOLHDL, LDLDIRECT in the last 72 hours. Thyroid Function Tests: No results for input(s): TSH, T4TOTAL, FREET4, T3FREE, THYROIDAB in the last 72 hours. Anemia Panel: No results for input(s): VITAMINB12, FOLATE, FERRITIN, TIBC, IRON, RETICCTPCT in the last 72 hours. Urine analysis:    Component Value Date/Time   LABSPEC 1.015 09/01/2014 1334   PHURINE 6.0 09/01/2014 1334   GLUCOSEU Negative 09/01/2014 1334   HGBUR Negative 09/01/2014 1334   BILIRUBINUR Negative 09/01/2014 1334   KETONESUR Negative 09/01/2014 1334   PROTEINUR Negative 09/01/2014 1334   UROBILINOGEN 0.2 09/01/2014 1334   NITRITE Negative 09/01/2014 1334   LEUKOCYTESUR Negative 09/01/2014 1334   Sepsis Labs: @LABRCNTIP (procalcitonin:4,lacticidven:4) )No results found for this or any previous visit (from the past 240 hour(s)).   Radiological Exams on Admission: Dg Chest 2 View  Result Date: 10/04/2017 CLINICAL DATA:  Chest pain and shortness of breath for 3 days. EXAM: CHEST  2 VIEW COMPARISON:  None. FINDINGS: The heart size and mediastinal contours are within normal limits. Aortic atherosclerosis. Both lungs are clear. Stable mild elevation of right hemidiaphragm. The visualized skeletal structures are unremarkable. IMPRESSION: Stable exam.  No active cardiopulmonary disease. Electronically Signed   By: Earle Gell M.D.   On: 10/04/2017 16:11   Dg Lumbar Spine Complete  Result Date: 10/04/2017 CLINICAL DATA:  Right hip and lower back pain since fall nearly a week ago. EXAM: LUMBAR SPINE - COMPLETE 4+ VIEW COMPARISON:  None. FINDINGS: No fracture or traumatic malalignment. Multilevel degenerative changes most marked at L5-S1. No other acute abnormalities are noted. IMPRESSION: Degenerative changes.  No fracture or traumatic malalignment. Electronically  Signed   By: Dorise Bullion III M.D   On: 10/04/2017 17:32   Ct Angio Chest Pe W And/or Wo Contrast  Addendum Date: 10/04/2017   ADDENDUM REPORT: 10/04/2017 20:57 ADDENDUM: Focal scarring in the lower right breast consistent with surgical changes Electronically Signed   By: Donavan Foil M.D.   On: 10/04/2017 20:57   Result Date: 10/04/2017 CLINICAL DATA:  Chest tightness with positive D-dimer EXAM: CT ANGIOGRAPHY CHEST WITH CONTRAST TECHNIQUE: Multidetector CT imaging of the chest was performed using the standard protocol during bolus administration of intravenous contrast. Multiplanar CT image reconstructions and MIPs were obtained to evaluate the vascular anatomy. CONTRAST:  100 mL Isovue 370 intravenous COMPARISON:  10/04/2017 FINDINGS: Cardiovascular: Satisfactory opacification of the pulmonary arteries to the segmental level. No evidence of pulmonary embolism. Aortic atherosclerosis. No dissection. Normal  heart size. No large pericardial effusion. Mediastinum/Nodes: Midline trachea. No thyroid mass. No significantly enlarged mediastinal lymph nodes. The esophagus is within normal limits. Lungs/Pleura: Multifocal diffuse bilateral ground-glass density. No pleural effusion. No pneumothorax. Upper Abdomen: No acute abnormality. Musculoskeletal: Degenerative changes. No acute or suspicious bone lesion Review of the MIP images confirms the above findings. IMPRESSION: 1. Negative for acute pulmonary embolus or aortic dissection 2. Moderate diffuse bilateral ground-glass density is suspicious for pulmonary edema. Aortic Atherosclerosis (ICD10-I70.0). Electronically Signed: By: Donavan Foil M.D. On: 10/04/2017 20:38   Dg Hip Unilat With Pelvis 2-3 Views Right  Result Date: 10/04/2017 CLINICAL DATA:  Fall nearly a week ago. EXAM: DG HIP (WITH OR WITHOUT PELVIS) 2-3V RIGHT COMPARISON:  None. FINDINGS: The well corticated calcification is seen adjacent to the greater trochanter, likely nonacute. No acute  fracture seen to explain the patient's symptoms. No other acute abnormalities are noted. IMPRESSION: The well corticated calcification adjacent to the greater trochanter is likely nonacute. No acute fracture seen. Electronically Signed   By: Dorise Bullion III M.D   On: 10/04/2017 17:34    EKG: Independently reviewed. Sinus tachycardia (rate 110).   Assessment/Plan  1. Acute on chronic diastolic CHF  - Pt presents with progressive dyspnea, orthopnea, noted to have pulm edema on imaging  - Troponin neg x2 and decreasing, PE ruled-out with CTA chest  - Treated in ED with Lasix 20 PO, will give another 20 mg IV now, continue q12h  - Continue cardiac monitoring, SLIV, follow I/O's and daily wts; just [facility], was prescribed losartan and metoprolol by cardiologist, but doesn't want to take after reading package insert   2. Type II DM  - No A1c on file - Managed at home with metformin only, held on admission  - Start a low-intensity SSI with Novolog   3. Chronic low back pain with radiculopathy  - Pt reports low back with pain radiating down right leg  - Lumbar-spine and right hip radiographs reviewed and negative for acute pathology  - Continue home regimen with Naprosyn and tramadol    4. Latent TB  - Continue isoniazid with pyridoxine   5. Hx of breast cancer  - Continue tamoxifen and outpatient follow-up  6. Rheumatoid arthritis  - Managed with methotrexate and prednisone  - Continue daily prednisone 5 mg   DVT prophylaxis: Lovenox   Code Status: Full  Family Communication: Husband updated at bedside Disposition Plan: Observe on telemetry Consults called: None Admission status: Observation    Vianne Bulls, MD [facility] Pager [phone]  If 7PM-7AM, please contact night-coverage [email or web address] Password [email or web address]  10/04/2017, 11:34 PM

## 2017-10-05 ENCOUNTER — Encounter (HOSPITAL_COMMUNITY): Payer: BLUE CROSS/BLUE SHIELD

## 2017-10-05 DIAGNOSIS — E118 Type 2 diabetes mellitus with unspecified complications: Secondary | ICD-10-CM | POA: Diagnosis not present

## 2017-10-05 DIAGNOSIS — R0609 Other forms of dyspnea: Secondary | ICD-10-CM

## 2017-10-05 DIAGNOSIS — I5033 Acute on chronic diastolic (congestive) heart failure: Secondary | ICD-10-CM | POA: Diagnosis not present

## 2017-10-05 DIAGNOSIS — R7611 Nonspecific reaction to tuberculin skin test without active tuberculosis: Secondary | ICD-10-CM | POA: Diagnosis not present

## 2017-10-05 LAB — BASIC METABOLIC PANEL
ANION GAP: 9 (ref 5–15)
BUN: 14 mg/dL (ref 6–20)
CALCIUM: 9.1 mg/dL (ref 8.9–10.3)
CO2: 23 mmol/L (ref 22–32)
Chloride: 104 mmol/L (ref 101–111)
Creatinine, Ser: 0.6 mg/dL (ref 0.44–1.00)
GFR calc Af Amer: >60 mL/min (ref >60)
GFR calc non Af Amer: >60 mL/min (ref >60)
GLUCOSE: 295 mg/dL — AB (ref 65–99)
Potassium: 3.5 mmol/L (ref 3.5–5.1)
Sodium: 136 mmol/L (ref 135–145)

## 2017-10-05 LAB — GLUCOSE, CAPILLARY
GLUCOSE-CAPILLARY: 315 mg/dL — AB (ref 65–99)
GLUCOSE-CAPILLARY: 351 mg/dL — AB (ref 65–99)
Glucose-Capillary: 257 mg/dL — ABNORMAL HIGH (ref 65–99)
Glucose-Capillary: 294 mg/dL — ABNORMAL HIGH (ref 65–99)

## 2017-10-05 MED ORDER — FERROUS SULFATE 325 (65 FE) MG PO TABS
325.0000 mg | ORAL_TABLET | Freq: Every day | ORAL | Status: DC
  Administered 2017-10-05: 325 mg via ORAL
  Filled 2017-10-05: qty 1

## 2017-10-05 MED ORDER — POTASSIUM CHLORIDE CRYS ER 20 MEQ PO TBCR
40.0000 meq | EXTENDED_RELEASE_TABLET | Freq: Every day | ORAL | Status: DC
  Administered 2017-10-05: 40 meq via ORAL
  Filled 2017-10-05: qty 2

## 2017-10-05 MED ORDER — FUROSEMIDE 10 MG/ML IJ SOLN: 40.0000 mg | Freq: Two times a day (BID) | INTRAMUSCULAR | Status: DC

## 2017-10-05 MED ORDER — PANTOPRAZOLE SODIUM 40 MG PO TBEC
40.0000 mg | DELAYED_RELEASE_TABLET | Freq: Every day | ORAL | Status: DC
  Administered 2017-10-05: 40 mg via ORAL
  Filled 2017-10-05 (×2): qty 1

## 2017-10-05 MED ORDER — LOSARTAN POTASSIUM 25 MG PO TABS
25.0000 mg | ORAL_TABLET | Freq: Every day | ORAL | Status: DC
  Administered 2017-10-05: 25 mg via ORAL
  Filled 2017-10-05 (×2): qty 1

## 2017-10-05 MED ORDER — METOPROLOL TARTRATE 25 MG PO TABS
25.0000 mg | ORAL_TABLET | Freq: Two times a day (BID) | ORAL | Status: DC
  Administered 2017-10-05: 25 mg via ORAL
  Filled 2017-10-05: qty 1

## 2017-10-05 MED ORDER — METOPROLOL TARTRATE 25 MG PO TABS: 12.5000 mg | ORAL_TABLET | Freq: Two times a day (BID) | ORAL | 0 refills | Status: AC

## 2017-10-05 MED ORDER — CYCLOBENZAPRINE HCL 10 MG PO TABS
10.0000 mg | ORAL_TABLET | Freq: Three times a day (TID) | ORAL | Status: DC | PRN
Start: 2017-10-05 — End: 2017-10-05

## 2017-10-05 MED ORDER — SIMVASTATIN 10 MG PO TABS
10.0000 mg | ORAL_TABLET | Freq: Every day | ORAL | Status: DC
  Administered 2017-10-05: 10 mg via ORAL
  Filled 2017-10-05: qty 1

## 2017-10-05 MED ORDER — ISONIAZID 300 MG PO TABS: 500.0000 mg | ORAL_TABLET | Freq: Every day | ORAL | Status: DC

## 2017-10-05 MED ORDER — FUROSEMIDE 40 MG PO TABS: 40.0000 mg | ORAL_TABLET | Freq: Every day | ORAL | Status: DC

## 2017-10-05 MED ORDER — CYCLOBENZAPRINE HCL 5 MG PO TABS
5.0000 mg | ORAL_TABLET | Freq: Three times a day (TID) | ORAL | Status: DC | PRN
Start: 2017-10-05 — End: 2017-10-05
  Administered 2017-10-05: 5 mg via ORAL
  Filled 2017-10-05: qty 1

## 2017-10-05 NOTE — Progress Notes (Signed)
Inpatient Diabetes Program Recommendations  AACE/ADA: New Consensus Statement on Inpatient Glycemic Control (2015)  Target Ranges:  Prepandial:   less than 140 mg/dL      Peak postprandial:   less than 180 mg/dL (1-2 hours)      Critically ill patients:  140 - 180 mg/dL   Lab Results  Component Value Date   GLUCAP 315 (H) 10/05/2017    Review of Glycemic Control  Results for BERRY, GALLACHER (MRN [id number]) as of 10/05/2017 10:06  Ref. Range 10/05/2017 00:37 10/05/2017 05:56 10/05/2017 07:57  Glucose-Capillary Latest Ref Range: 65 - 99 mg/dL 257 (H)  315 (H)    Diabetes history: type 2   Outpatient Diabetes medications: metformin 500 mg daily  Current orders for Inpatient glycemic control: Sensitive correction (not to be held if NPO) tidwc and HS correction and 4 units meal coverage tidwc  Inpatient Diabetes Program Recommendations:    Please consider addition of basal lantus starting with 10 units Please order HgbA1C.  Thank you Rosita Kea, RN, MSN, CDE  Diabetes Inpatient Program Office: [phone] Pager: [phone] 8:00 am to 5:00 pmh 10 units daily or HS

## 2017-10-05 NOTE — Discharge Summary (Addendum)
Physician Discharge Summary  Brandy Jackson YNW:[id number] DOB: October 25, 1951 DOA: 10/04/2017  PCP: Ricke Hey, MD  Admit date: 10/04/2017 Discharge date: 10/05/2017   Recommendations for Outpatient Follow-Up:   HgbA1C- -strict diabetic control Close cardiology follow up Has not been taking BP Meds at home (BB/ARB) nor following diabetic diet--- need further education  Discharge Diagnosis:   Principal Problem:   Acute on chronic diastolic CHF (congestive heart failure) ([place]) Active Problems:   Breast cancer of upper-outer quadrant of right female breast ([place])   Type II diabetes mellitus with manifestations ([place])   Hypertrophic cardiomyopathy ([place])   Latent tuberculosis   Discharge disposition:  Home.  Discharge Condition: Improved.  Diet recommendation: Low sodium, heart healthy.  Carbohydrate-modified  Wound care: None.   History of Present Illness:   Brandy Jackson is a 66 y.o. female with medical history significant for type 2 diabetes mellitus, latent tuberculosis currently under treatment, and recent diagnosis of hypertrophic cardiomyopathy and diastolic heart failure, now presenting to the emergency department with shortness of breath, chest tightness, and low back pain with radiation down the right leg.  Patient reports that she has been having dyspnea with exertion and chest tightness for several weeks and has been evaluated by her cardiologist with the [facility] recently.  She underwent echocardiogram and was diagnosed with hypertrophic cardiomyopathy and chronic diastolic dysfunction.  She was recommended to start losartan and metoprolol, but after reading the package inserts, has not started them.  Her symptoms have been worsening, particularly over the last 2-3 days.  She denies fevers or chills and denies calf tenderness or swelling.  Reports some mild orthopnea.   Hospital Course by Problem:    Acute on chronic diastolic CHF  - improved with IV  diuresis -cardiology follow up  Type II DM  - No A1c on file - Managed at home with metformin only -close outpatient follow up  Chronic low back pain with radiculopathy  - improved with muscle relaxer  Latent TB  - Continue home meds   Hx of breast cancer  - Continue tamoxifen and outpatient follow-up -no PE   Rheumatoid arthritis  - Managed with methotrexate and prednisone  - Continue daily prednisone 5 mg   Medical Consultants:    None.   Discharge Exam:   Vitals:   10/05/17 0507 10/05/17 1100  BP: (!) 134/59 (!) 141/83  Pulse: 95 99  Resp: 18   Temp: 98.2 F (36.8 C) 98.2 F (36.8 C)  SpO2: 97% 97%   Vitals:   10/05/17 0011 10/05/17 0030 10/05/17 0507 10/05/17 1100  BP: (!) 147/78 111/74 (!) 134/59 (!) 141/83  Pulse: 92 (!) 102 95 99  Resp: (!) 22 20 18    Temp:  98.1 F (36.7 C) 98.2 F (36.8 C) 98.2 F (36.8 C)  TempSrc:  Oral Oral Oral  SpO2: 94% 100% 97% 97%  Weight:  90.3 kg (199 lb)    Height:  5\' 6"  (1.676 m)      Gen:  NAD    The results of significant diagnostics from this hospitalization (including imaging, microbiology, ancillary and laboratory) are listed below for reference.     Procedures and Diagnostic Studies:   Dg Chest 2 View  Result Date: 10/04/2017 CLINICAL DATA:  Chest pain and shortness of breath for 3 days. EXAM: CHEST  2 VIEW COMPARISON:  None. FINDINGS: The heart size and mediastinal contours are within normal limits. Aortic atherosclerosis. Both lungs are clear. Stable mild elevation of right hemidiaphragm.  The visualized skeletal structures are unremarkable. IMPRESSION: Stable exam.  No active cardiopulmonary disease. Electronically Signed   By: Earle Gell M.D.   On: 10/04/2017 16:11   Dg Lumbar Spine Complete  Result Date: 10/04/2017 CLINICAL DATA:  Right hip and lower back pain since fall nearly a week ago. EXAM: LUMBAR SPINE - COMPLETE 4+ VIEW COMPARISON:  None. FINDINGS: No fracture or traumatic  malalignment. Multilevel degenerative changes most marked at L5-S1. No other acute abnormalities are noted. IMPRESSION: Degenerative changes.  No fracture or traumatic malalignment. Electronically Signed   By: Dorise Bullion III M.D   On: 10/04/2017 17:32   Ct Angio Chest Pe W And/or Wo Contrast  Addendum Date: 10/04/2017   ADDENDUM REPORT: 10/04/2017 20:57 ADDENDUM: Focal scarring in the lower right breast consistent with surgical changes Electronically Signed   By: Donavan Foil M.D.   On: 10/04/2017 20:57   Result Date: 10/04/2017 CLINICAL DATA:  Chest tightness with positive D-dimer EXAM: CT ANGIOGRAPHY CHEST WITH CONTRAST TECHNIQUE: Multidetector CT imaging of the chest was performed using the standard protocol during bolus administration of intravenous contrast. Multiplanar CT image reconstructions and MIPs were obtained to evaluate the vascular anatomy. CONTRAST:  100 mL Isovue 370 intravenous COMPARISON:  10/04/2017 FINDINGS: Cardiovascular: Satisfactory opacification of the pulmonary arteries to the segmental level. No evidence of pulmonary embolism. Aortic atherosclerosis. No dissection. Normal heart size. No large pericardial effusion. Mediastinum/Nodes: Midline trachea. No thyroid mass. No significantly enlarged mediastinal lymph nodes. The esophagus is within normal limits. Lungs/Pleura: Multifocal diffuse bilateral ground-glass density. No pleural effusion. No pneumothorax. Upper Abdomen: No acute abnormality. Musculoskeletal: Degenerative changes. No acute or suspicious bone lesion Review of the MIP images confirms the above findings. IMPRESSION: 1. Negative for acute pulmonary embolus or aortic dissection 2. Moderate diffuse bilateral ground-glass density is suspicious for pulmonary edema. Aortic Atherosclerosis (ICD10-I70.0). Electronically Signed: By: Donavan Foil M.D. On: 10/04/2017 20:38   Dg Hip Unilat With Pelvis 2-3 Views Right  Result Date: 10/04/2017 CLINICAL DATA:  Fall  nearly a week ago. EXAM: DG HIP (WITH OR WITHOUT PELVIS) 2-3V RIGHT COMPARISON:  None. FINDINGS: The well corticated calcification is seen adjacent to the greater trochanter, likely nonacute. No acute fracture seen to explain the patient's symptoms. No other acute abnormalities are noted. IMPRESSION: The well corticated calcification adjacent to the greater trochanter is likely nonacute. No acute fracture seen. Electronically Signed   By: Dorise Bullion III M.D   On: 10/04/2017 17:34     Labs:   Basic Metabolic Panel:  Recent Labs Lab 10/04/17 1447 10/05/17 0556  NA 136 136  K 3.9 3.5  CL 102 104  CO2 20* 23  GLUCOSE 289* 295*  BUN 14 14  CREATININE 0.55 0.60  CALCIUM 9.4 9.1   GFR Estimated Creatinine Clearance: 78.3 mL/min (by C-G formula based on SCr of 0.6 mg/dL). Liver Function Tests: No results for input(s): AST, ALT, ALKPHOS, BILITOT, PROT, ALBUMIN in the last 168 hours. No results for input(s): LIPASE, AMYLASE in the last 168 hours. No results for input(s): AMMONIA in the last 168 hours. Coagulation profile No results for input(s): INR, PROTIME in the last 168 hours.  CBC:  Recent Labs Lab 10/04/17 1447  WBC 10.3  HGB 12.5  HCT 38.3  MCV 84.9  PLT 296   Cardiac Enzymes: No results for input(s): CKTOTAL, CKMB, CKMBINDEX, TROPONINI in the last 168 hours. BNP: Invalid input(s): POCBNP CBG:  Recent Labs Lab 10/05/17 0037 10/05/17 0757 10/05/17 1151  GLUCAP  257* 315* 351*   D-Dimer  Recent Labs  10/04/17 1614  DDIMER 0.86*   Hgb A1c No results for input(s): HGBA1C in the last 72 hours. Lipid Profile No results for input(s): CHOL, HDL, LDLCALC, TRIG, CHOLHDL, LDLDIRECT in the last 72 hours. Thyroid function studies No results for input(s): TSH, T4TOTAL, T3FREE, THYROIDAB in the last 72 hours.  Invalid input(s): FREET3 Anemia work up No results for input(s): VITAMINB12, FOLATE, FERRITIN, TIBC, IRON, RETICCTPCT in the last 72  hours. Microbiology No results found for this or any previous visit (from the past 240 hour(s)).   Discharge Instructions:   Discharge Instructions    Diet - low sodium heart healthy    Complete by:  As directed    Diet Carb Modified    Complete by:  As directed    Discharge instructions    Complete by:  As directed    Resume home BB and ARB Monitor blood sugars closely- bring log to PCP BMP 1 week   Increase activity slowly    Complete by:  As directed      Allergies as of 10/05/2017      Reactions   Sulfa Antibiotics Rash      Medication List    TAKE these medications   calcipotriene 0.005 % cream Commonly known as:  DOVONOX Apply 1 application topically 2 (two) times daily as needed (itching from psoriasis).   cholecalciferol 1000 units tablet Commonly known as:  VITAMIN D Take 1,000 Units by mouth daily.   clobetasol 0.05 % external solution Commonly known as:  TEMOVATE Apply 1 application topically 2 (two) times daily. What changed:  when to take this  reasons to take this   cyclobenzaprine 10 MG tablet Commonly known as:  FLEXERIL Take 10 mg by mouth every 8 (eight) hours as needed for muscle spasms.   ferrous sulfate 324 (65 Fe) MG Tbec Take 1 tablet by mouth daily.   Fish Oil 1000 MG Caps Take 1,000 mg by mouth daily.   folic acid 1 MG tablet Commonly known as:  FOLVITE Take 1 mg by mouth See admin instructions. Take 1 tablet (1 mg) by mouth every day except Saturday (do not take on the same day as methotrexate)   isoniazid 100 MG tablet Commonly known as:  NYDRAZID Take 500 mg by mouth daily.   ketoconazole 2 % cream Commonly known as:  NIZORAL Apply 1 application topically daily. What changed:  when to take this  reasons to take this   losartan 25 MG tablet Commonly known as:  COZAAR Take 25 mg by mouth daily.   metFORMIN 500 MG tablet Commonly known as:  GLUCOPHAGE Take 500 mg by mouth daily.   methotrexate 2.5 MG  tablet Commonly known as:  RHEUMATREX Take 10 mg by mouth 2 (two) times a week. Caution:Chemotherapy. Protect from light  Saturday   METHOTREXATE PO Take 4 tablets by mouth See admin instructions. Take 4 tablets by mouth Saturday morning and take 4 tablets Saturday night   metoprolol tartrate 25 MG tablet Commonly known as:  LOPRESSOR Take 0.5 tablets (12.5 mg total) by mouth 2 (two) times daily.   naproxen 500 MG tablet Commonly known as:  NAPROSYN Take 500 mg by mouth daily as needed (pain).   omeprazole 20 MG capsule Commonly known as:  PRILOSEC Take 40 mg by mouth daily.   predniSONE 5 MG tablet Commonly known as:  DELTASONE Take 5 mg by mouth daily with breakfast.   simvastatin 20  MG tablet Commonly known as:  ZOCOR Take 10 mg by mouth daily.   TAMOXIFEN CITRATE PO Take 1 tablet by mouth daily.   traMADol 50 MG tablet Commonly known as:  ULTRAM Take 1 tablet (50 mg total) by mouth every 6 (six) hours as needed. What changed:  reasons to take this   VITAMIN B-12 PO Take 1 tablet by mouth daily.   VITAMIN B-6 PO Take 1 tablet by mouth daily.   VOLTAREN 1 % Gel Generic drug:  diclofenac sodium Apply 1 application topically 2 (two) times daily as needed (neck pain).      Follow-up Information    Ricke Hey, MD Follow up in 1 week(s).   Specialty:  Family Medicine Contact information: [address] [place] [address] [phone]        Dixie Dials, MD Follow up in 1 week(s).   Specialty:  Cardiology Why:  BMP 1 week patient to resume BB And ARB Contact information: [place] [address] [phone]            Time coordinating discharge: 35 min  Signed:  Chrystel Barefield U Ryen Heitmeyer   [facility] 10/05/2017, 5:03 PM

## 2017-11-06 ENCOUNTER — Ambulatory Visit (INDEPENDENT_AMBULATORY_CARE_PROVIDER_SITE_OTHER): Payer: Non-veteran care | Admitting: Rehabilitative and Restorative Service Providers"

## 2017-11-06 ENCOUNTER — Encounter: Payer: Self-pay | Admitting: Rehabilitative and Restorative Service Providers"

## 2017-11-06 DIAGNOSIS — R2689 Other abnormalities of gait and mobility: Secondary | ICD-10-CM

## 2017-11-06 DIAGNOSIS — M25551 Pain in right hip: Secondary | ICD-10-CM

## 2017-11-06 DIAGNOSIS — R29898 Other symptoms and signs involving the musculoskeletal system: Secondary | ICD-10-CM

## 2017-11-06 DIAGNOSIS — M25562 Pain in left knee: Secondary | ICD-10-CM

## 2017-11-06 NOTE — Therapy (Signed)
[facility], [place], [address] Phone: [phone]   Fax:  [phone]  Physical Therapy Evaluation  Patient Details  Name: Brandy Jackson MRN: [id number] Date of Birth: 09-Apr-1951 Referring Provider: Dr Dianah Field    Encounter Date: 11/06/2017  PT End of Session - 11/06/17 1338    Visit Number  1    Number of Visits  12    Date for PT Re-Evaluation  12/18/17    PT Start Time  1102    PT Stop Time  1205    PT Time Calculation (min)  63 min    Activity Tolerance  Patient tolerated treatment well       Past Medical History:  Diagnosis Date  . Breast cancer ([place])   . Cancer ([place])   . Diabetes mellitus without complication ([place])   . Hot flashes   . Psoriatic arthritis ([place])   . 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 Wears glasses   . Wears partial dentures    bottom    Past Surgical History:  Procedure Laterality Date  . BLADDER REPAIR W/ CESAREAN SECTION    . COLONOSCOPY    . PORTACATH PLACEMENT N/A 04/11/2014   Procedure: INSERTION PORT-A-CATH;  Surgeon: Joyice Faster. Cornett, MD;  Location: [facility];  Service: General;  Laterality: N/A;  . RE-EXCISION OF BREAST LUMPECTOMY Right 06/06/2014   Procedure: RE-EXCISION OF BREAST LUMPECTOMY;  Surgeon: Marcello Moores A. Cornett, MD;  Location: [facility];  Service: General;  Laterality: Right;    There were no vitals filed for this visit.   Subjective Assessment - 11/06/17 1110    Subjective  Patient reports that she fell 09/21/17 sustaining injury to LB. She was in a wheelchair until she saw the PT at the [facility] the first of November. She was treated with DN with significant improvement. She continues to have pain in the LB and Rt hip as well as pain in the Lt knee.     Pertinent History  arthritis     How long can you sit comfortably?  30 min     How long can you stand comfortably?  30 min     How  long can you walk comfortably?  20-30 min     Diagnostic tests  xrays     Patient Stated Goals  to be pain free     Currently in Pain?  Yes    Pain Score  0-No pain    Pain Location  Back    Pain Orientation  Right    Pain Descriptors / Indicators  Aching    Pain Type  Acute pain    Pain Radiating Towards  down into the back of hip and thigh     Pain Onset  More than a month ago    Pain Frequency  Intermittent    Aggravating Factors   prolonged sitting; standing; walking; bending; lifting; household chores; putting shoes on     Pain Relieving Factors  DN; heat; medicine          [facility] PT Assessment - 11/06/17 0001      Assessment   Medical Diagnosis  LBP Rt hip pain     Referring Provider  Dr Dianah Field     Onset Date/Surgical Date  09/21/17    Hand Dominance  Right    Next MD Visit  PRN     [facility]  Precautions   Precautions  None      Balance Screen   Has the patient fallen in the past 6 months  Yes    How many times?  1    Has the patient had a decrease in activity level because of a fear of falling?   No    Is the patient reluctant to leave their home because of a fear of falling?   No      Prior Function   Level of Independence  Independent    Vocation  Retired    Equities trader - retired 2017    Leisure  household chores and some cooking; sedentary       Observation/Other Assessments   Focus on Therapeutic Outcomes (FOTO)   55% limitation       Sensation   Additional Comments  WFL's per pt report       Posture/Postural Control   Posture Comments  head forward; shoudlers rounced; flexed forward at hips; genu valgus bilat knees much greater on Lt than Rt       AROM   Right/Left Hip  -- tight end ranges throughout bilat hips Rt > Lt     Right/Left Knee  -- Rt knee WFL's     Left Knee Extension  -14    Left Knee Flexion  94      Strength   Right/Left Hip  -- Rt hip  4-/5 to 4/5; Lt 4/5 to 4+/5     Right  Knee Flexion  -- 5-/5    Right Knee Extension  5/5    Left Knee Flexion  4-/5    Left Knee Extension  4/5      Flexibility   Hamstrings  tight Rt 85 deg; Lt 75 deg     Quadriceps  tight Rt 100 deg ; Lt 90 deg     ITB  tight Rt     Piriformis  tight Rt       Palpation   Palpation comment  tight Rt> Lt posterior hip musculature including piriformis, glut med/max; Lt hamstrings       Special Tests    Special Tests  -- SLR Fabers (-) bilat       Ambulation/Gait   Gait Comments  abnormal gait pattern with postural changes as previously noted; slowed gait              Objective measurements completed on examination: See above findings.      [facility] Adult PT Treatment/Exercise - 11/06/17 0001      Moist Heat Therapy   Number Minutes Moist Heat  20 Minutes    Moist Heat Location  Hip Rt posterior; Lt hamstrings       Electrical Stimulation   Electrical Stimulation Location  Rt posterior hip     Electrical Stimulation Action  IFC    Electrical Stimulation Parameters  to tolerance    Electrical Stimulation Goals  Pain;Tone       Trigger Point Dry Needling - 11/06/17 1319    Consent Given?  Yes    Education Handout Provided  Yes    Muscles Treated Lower Body  Gluteus minimus;Gluteus maximus;Piriformis;Hamstring Rt posterior hip; Lt hamstrings with estim     Gluteus Maximus Response  Palpable increased muscle length    Gluteus Minimus Response  Palpable increased muscle length    Piriformis Response  Palpable increased muscle length    Hamstring Response  Palpable increased muscle length  PT Education - 11-29-17 1318    Education provided  Yes    Education Details  DN    Person(s) Educated  Patient    Methods  Explanation    Comprehension  Verbalized understanding          PT Long Term Goals - 11/29/2017 1344      PT LONG TERM GOAL #1   Title  Increase strength bilat LE's to =/> 5-/5  - - 12/18/17    Time  6    Period  Weeks    Status  New       PT LONG TERM GOAL #2   Title  Decrease pain Rt hip by 50-75% aloowing patient to return to normal functional activities 12/18/17    Time  6    Period  Weeks    Status  New      PT LONG TERM GOAL #3   Title  Improve gait pattern with patient to ambulate with improved wt shift and wt bearing for functional distances =/> 400-500 ft 12/18/17    Time  6    Period  Weeks    Status  New      PT LONG TERM GOAL #4   Title  Independent in HEP 12/18/17    Time  6    Period  Weeks    Status  New      PT LONG TERM GOAL #5   Title  Improve FOTO to </= 38% limitation 12/18/17    Time  6    Period  Weeks    Status  New             Plan - 11/29/17 1340    Clinical Impression Statement  Brandy Jackson presents with Rt hip pain which is a result of a fall 10/18. She has had radicular pain into the Rt posterior hip and thigh as well as pain in the Lt knee. Patient has limited trunk and LE moblity and ROM; decreased LE strength; abnormal posture and alignment; abnormal gait pattern; muscular tightness to palpation; limited functional activity level. Patient received one treatment of dry needling and responded very well to this modality. She would like to continue wit hadditonal DN treatment. Patient will benefit form PT to address problems identified.      Clinical Presentation  Stable    Clinical Decision Making  Moderate    Rehab Potential  Good    PT Frequency  2x / week    PT Duration  6 weeks    PT Treatment/Interventions  Patient/family education;ADLs/Self Care Home Management;Cryotherapy;Electrical Stimulation;Iontophoresis 4mg /ml Dexamethasone;Moist Heat;Ultrasound;Dry needling;Manual techniques;Neuromuscular re-education;Therapeutic activities;Therapeutic exercise    PT Next Visit Plan  stretching and strengthening exercises Rt hip and Lt knee; DN; manual work; modalities as indicated     Oncologist with Plan of Care  Patient       Patient will benefit from skilled therapeutic  intervention in order to improve the following deficits and impairments:  Postural dysfunction, Improper body mechanics, Increased muscle spasms, Increased fascial restricitons, Decreased strength, Decreased range of motion, Decreased mobility, Decreased activity tolerance, Abnormal gait  Visit Diagnosis: Pain in right hip - Plan: PT plan of care cert/re-cert  Acute pain of left knee - Plan: PT plan of care cert/re-cert  Other symptoms and signs involving the musculoskeletal system - Plan: PT plan of care cert/re-cert  Other abnormalities of gait and mobility - Plan: PT plan of care cert/re-cert  [facility] PT PB G-CODES - 11-29-2017 1510  Functional Assessment Tool Used   Evaluation; FOTO; clinical assessment     Functional Limitations  Mobility: Walking and moving around    Mobility: Walking and Moving Around Current Status  At least 40 percent but less than 60 percent impaired, limited or restricted    Mobility: Walking and Moving Around Goal Status [phone])  At least 20 percent but less than 40 percent impaired, limited or restricted        Problem List Patient Active Problem List   Diagnosis Date Noted  . Hypertrophic cardiomyopathy ([place]) 10/04/2017  . Acute on chronic diastolic CHF (congestive heart failure) ([place]) 10/04/2017  . 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 Psoriatic arthritis ([place]) 07/31/2015  . Vitamin D deficiency 04/13/2015  . Mood changes 03/16/2015  . 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 Bilateral lower extremity edema 09/15/2014  . Malfunction of device 08/28/2014  . Laryngitis 08/18/2014  . Type II diabetes mellitus with manifestations ([place]) 08/04/2014  . Peripheral neuropathy due to chemotherapy ([place]) 08/04/2014  . Diarrhea 08/04/2014  . Fatigue due to treatment 08/04/2014  . Rectal bleed 06/30/2014  . Post-operative state 04/24/2014  . Breast cancer of upper-outer quadrant of right female breast ([place]) 03/03/2014    Ardie Mclennan Nilda Simmer PT, MPH  11/06/2017, 3:23  PM  [facility] [place], [place], [address] Phone: [phone]   Fax:  [phone]  Name: Brandy Jackson MRN: [id number] Date of Birth: 07-Feb-1951

## 2017-11-06 NOTE — Patient Instructions (Signed)

## 2017-11-11 ENCOUNTER — Ambulatory Visit (INDEPENDENT_AMBULATORY_CARE_PROVIDER_SITE_OTHER): Payer: Non-veteran care | Admitting: Physical Therapy

## 2017-11-11 ENCOUNTER — Encounter: Payer: Self-pay | Admitting: Physical Therapy

## 2017-11-11 DIAGNOSIS — R29898 Other symptoms and signs involving the musculoskeletal system: Secondary | ICD-10-CM | POA: Diagnosis not present

## 2017-11-11 DIAGNOSIS — M25551 Pain in right hip: Secondary | ICD-10-CM | POA: Diagnosis not present

## 2017-11-11 DIAGNOSIS — R2689 Other abnormalities of gait and mobility: Secondary | ICD-10-CM

## 2017-11-11 DIAGNOSIS — M25562 Pain in left knee: Secondary | ICD-10-CM | POA: Diagnosis not present

## 2017-11-11 NOTE — Therapy (Signed)
[place], [place], [address] Phone: [phone]   Fax:  [phone]  Physical Therapy Treatment  Patient Details  Name: Brandy Jackson MRN: [id number] Date of Birth: 1951/10/09 Referring Provider: Dr Dianah Field    Encounter Date: 11/11/2017  PT End of Session - 11/11/17 1106    Visit Number  2    Number of Visits  12    Date for PT Re-Evaluation  12/18/17    PT Start Time  1106    PT Stop Time  1154    PT Time Calculation (min)  48 min    Activity Tolerance  Patient tolerated treatment well       Past Medical History:  Diagnosis Date  . Breast cancer ([place])   . Cancer ([place])   . Diabetes mellitus without complication ([place])   . Hot flashes   . Psoriatic arthritis ([place])   . 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 Wears glasses   . Wears partial dentures    bottom    Past Surgical History:  Procedure Laterality Date  . BLADDER REPAIR W/ CESAREAN SECTION    . COLONOSCOPY    . PORTACATH PLACEMENT N/A 04/11/2014   Procedure: INSERTION PORT-A-CATH;  Surgeon: Joyice Faster. Cornett, MD;  Location: [facility];  Service: General;  Laterality: N/A;  . RE-EXCISION OF BREAST LUMPECTOMY Right 06/06/2014   Procedure: RE-EXCISION OF BREAST LUMPECTOMY;  Surgeon: Marcello Moores A. Cornett, MD;  Location: [facility];  Service: General;  Laterality: Right;    There were no vitals filed for this visit.  Subjective Assessment - 11/11/17 1106    Subjective  Pt reports the DN helps her a lot, the Lt knee is sore today.  Her pain there returned this week.  Also has numbness into the rt foot.     Currently in Pain?  Yes    Pain Score  6     Pain Location  Knee    Pain Orientation  Left    Pain Descriptors / Indicators  Dull;Aching    Pain Type  Chronic pain    Aggravating Factors   walking    Pain Relieving Factors  resting    Effect of Pain on Daily Activities   does have tightness in the middle of her low back when she exercised this AM                       [facility] Adult PT Treatment/Exercise - 11/11/17 0001      Modalities   Modalities  Electrical Stimulation;Moist Heat      Moist Heat Therapy   Number Minutes Moist Heat  20 Minutes    Moist Heat Location  Hip;Other (comment) gastroc Rt      Electrical Stimulation   Electrical Stimulation Location  Rt posterior hip and gastroc    Electrical Stimulation Action  premod    Electrical Stimulation Parameters  to tolerance    Electrical Stimulation Goals  Pain;Tone      Manual Therapy   Manual Therapy  Soft tissue mobilization    Soft tissue mobilization  STM to Rt buttocks, low back, upper hamstrings and gastroc       Trigger Point Dry Needling - 11/11/17 1118    Consent Given?  Yes    Education Handout Provided  No    Muscles Treated Upper Body  Longissimus    Muscles Treated Lower Body  Gluteus  maximus;Gluteus minimus;Gastrocnemius    Longissimus Response  Palpable increased muscle length;Twitch response elicited Rt N0-5    Gluteus Maximus Response  Palpable increased muscle length;Twitch response elicited Rt    Gluteus Minimus Response  Palpable increased muscle length;Twitch response elicited Rt    Gastrocnemius Response  Palpable increased muscle length;Twitch response elicited Rt                PT Long Term Goals - 11/11/17 1138      PT LONG TERM GOAL #1   Title  Increase strength bilat LE's to =/> 5-/5  - - 12/18/17    Status  On-going      PT LONG TERM GOAL #2   Title  Decrease pain Rt hip by 50-75% aloowing patient to return to normal functional activities 12/18/17    Status  On-going      PT LONG TERM GOAL #3   Title  Improve gait pattern with patient to ambulate with improved wt shift and wt bearing for functional distances =/> 400-500 ft 12/18/17    Status  On-going      PT LONG TERM GOAL #4   Title  Independent in HEP 12/18/17    Status   On-going      PT LONG TERM GOAL #5   Title  Improve FOTO to </= 38% limitation 12/18/17    Status  On-going            Plan - 11/11/17 1139    Clinical Impression Statement  This is [name]'s second visit.  She reports good response to DN and wishes to have this again.  She did have some soreness after her last visit. She had some tightness and trigger points in her Rt gluts and gastroc.     PT Frequency  2x / week    PT Duration  6 weeks    PT Treatment/Interventions  Patient/family education;ADLs/Self Care Home Management;Cryotherapy;Electrical Stimulation;Iontophoresis 4mg /ml Dexamethasone;Moist Heat;Ultrasound;Dry needling;Manual techniques;Neuromuscular re-education;Therapeutic activities;Therapeutic exercise    PT Next Visit Plan  stretching and strengthening exercises Rt hip and Lt knee; DN; manual work; modalities as indicated     Oncologist with Plan of Care  Patient       Patient will benefit from skilled therapeutic intervention in order to improve the following deficits and impairments:  Postural dysfunction, Improper body mechanics, Increased muscle spasms, Increased fascial restricitons, Decreased strength, Decreased range of motion, Decreased mobility, Decreased activity tolerance, Abnormal gait  Visit Diagnosis: Pain in right hip  Acute pain of left knee  Other symptoms and signs involving the musculoskeletal system  Other abnormalities of gait and mobility     Problem List Patient Active Problem List   Diagnosis Date Noted  . Hypertrophic cardiomyopathy ([place]) 10/04/2017  . Acute on chronic diastolic CHF (congestive heart failure) ([place]) 10/04/2017  . 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 Psoriatic arthritis ([place]) 07/31/2015  . Vitamin D deficiency 04/13/2015  . Mood changes 03/16/2015  . 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 Bilateral lower extremity edema 09/15/2014  . Malfunction of device 08/28/2014  . Laryngitis 08/18/2014  . Type II  diabetes mellitus with manifestations ([place]) 08/04/2014  . Peripheral neuropathy due to chemotherapy ([place]) 08/04/2014  . Diarrhea 08/04/2014  . Fatigue due to treatment 08/04/2014  . Rectal bleed 06/30/2014  . Post-operative state 04/24/2014  . Breast cancer of upper-outer quadrant of right female breast ([place]) 03/03/2014    Manuela Schwartz Caroll Cunnington PT  11/11/2017, 11:41 AM  [place]  [place], [place], [address] Phone: [phone]   Fax:  [phone]  Name: SHENISE WOLGAMOTT MRN: [id number] Date of Birth: [birth year]

## 2017-11-11 NOTE — Patient Instructions (Signed)

## 2017-11-13 ENCOUNTER — Encounter: Payer: Self-pay | Admitting: Rehabilitative and Restorative Service Providers"

## 2017-11-13 ENCOUNTER — Ambulatory Visit (INDEPENDENT_AMBULATORY_CARE_PROVIDER_SITE_OTHER): Payer: Non-veteran care | Admitting: Rehabilitative and Restorative Service Providers"

## 2017-11-13 DIAGNOSIS — R29898 Other symptoms and signs involving the musculoskeletal system: Secondary | ICD-10-CM | POA: Diagnosis not present

## 2017-11-13 DIAGNOSIS — M25562 Pain in left knee: Secondary | ICD-10-CM

## 2017-11-13 DIAGNOSIS — R2689 Other abnormalities of gait and mobility: Secondary | ICD-10-CM

## 2017-11-13 DIAGNOSIS — M25551 Pain in right hip: Secondary | ICD-10-CM | POA: Diagnosis not present

## 2017-11-13 NOTE — Patient Instructions (Signed)
Gluteal Sets    Tighten buttocks while pressing pelvis to floor. Hold __10__ seconds. Repeat _10___ times per set. Do __1-2__ sets per session. Do __1-2__ sessions per day.   Pelvic Press    Place hands under belly between navel and pubic bone, palms up. Feel pressure on hands. Increase pressure on hands by pressing pelvis down. This is NOT a pelvic tilt. Hold __5-10_ seconds. Relax. Repeat _10__ times.   Hip Flexor, Quadricep Stretch: Belly Down (Strap)    Engage stable pelvic tilt. Hold top of foot with strap. Hold for __30 sec  Repeat __3__ times each leg.

## 2017-11-13 NOTE — Therapy (Signed)
[place], [place], [address] Phone: [phone]   Fax:  [phone]  Physical Therapy Treatment  Patient Details  Name: Brandy Jackson MRN: [id number] Date of Birth: [birth year] Referring Provider: Dr Dianah Field    Encounter Date: 11/13/2017  PT End of Session - 11/13/17 1525    Visit Number  3    Number of Visits  12    Date for PT Re-Evaluation  12/18/17    PT Start Time  1526    PT Stop Time  1622    PT Time Calculation (min)  56 min    Activity Tolerance  Patient tolerated treatment well       Past Medical History:  Diagnosis Date  . Breast cancer ([place])   . Cancer ([place])   . Diabetes mellitus without complication ([place])   . Hot flashes   . Psoriatic arthritis ([place])   . 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 Wears glasses   . Wears partial dentures    bottom    Past Surgical History:  Procedure Laterality Date  . BLADDER REPAIR W/ CESAREAN SECTION    . COLONOSCOPY    . PORTACATH PLACEMENT N/A 04/11/2014   Procedure: INSERTION PORT-A-CATH;  Surgeon: Joyice Faster. Cornett, MD;  Location: [facility];  Service: General;  Laterality: N/A;  . RE-EXCISION OF BREAST LUMPECTOMY Right 06/06/2014   Procedure: RE-EXCISION OF BREAST LUMPECTOMY;  Surgeon: Marcello Moores A. Cornett, MD;  Location: [facility];  Service: General;  Laterality: Right;    There were no vitals filed for this visit.  Subjective Assessment - 11/13/17 1526    Subjective  Patient reports that her hip and back feels better - she is still having Lt knee pain. Knee really hurts  worse with riding to [place] and back today.     Currently in Pain?  Yes    Pain Score  7     Pain Location  Knee    Pain Orientation  Left    Pain Descriptors / Indicators  Aching;Dull                      [facility] Adult PT Treatment/Exercise - 11/13/17 0001      Knee/Hip Exercises:  Stretches   Sports administrator  Left;3 reps prone with strap       Knee/Hip Exercises: Aerobic   Nustep  L5 x 6 min       Knee/Hip Exercises: Prone   Other Prone Exercises  glut set 10 sec x 10; pelvic press 5 sec x 10 (SLR = pain unalble to do)       Moist Heat Therapy   Number Minutes Moist Heat  20 Minutes    Moist Heat Location  Hip bilat hips; Lt hamstring       Electrical Stimulation   Electrical Stimulation Location  bialt posterior hips     Electrical Stimulation Action  IFC    Electrical Stimulation Parameters  to tolerance    Electrical Stimulation Goals  Pain;Tone      Manual Therapy   Manual Therapy  Soft tissue mobilization    Soft tissue mobilization  bilat posterior hips/buttocks, low back,  Lt hamstrings     Myofascial Release  posterior Rt hip/gluts        Trigger Point Dry Needling - 11/13/17 1612    Consent Given?  No    Muscles Treated Lower Body  -- bilat  posterior hips     Gluteus Maximus Response  Palpable increased muscle length    Gluteus Minimus Response  Palpable increased muscle length    Piriformis Response  Palpable increased muscle length           PT Education - 11/13/17 1609    Education provided  Yes    Education Details  HEP     Person(s) Educated  Patient    Methods  Explanation;Demonstration;Tactile cues;Verbal cues;Handout    Comprehension  Verbalized understanding;Returned demonstration;Verbal cues required;Tactile cues required          PT Long Term Goals - 11/11/17 1138      PT LONG TERM GOAL #1   Title  Increase strength bilat LE's to =/> 5-/5  - - 12/18/17    Status  On-going      PT LONG TERM GOAL #2   Title  Decrease pain Rt hip by 50-75% aloowing patient to return to normal functional activities 12/18/17    Status  On-going      PT LONG TERM GOAL #3   Title  Improve gait pattern with patient to ambulate with improved wt shift and wt bearing for functional distances =/> 400-500 ft 12/18/17    Status  On-going      PT  LONG TERM GOAL #4   Title  Independent in HEP 12/18/17    Status  On-going      PT LONG TERM GOAL #5   Title  Improve FOTO to </= 38% limitation 12/18/17    Status  On-going            Plan - 11/13/17 1532    Clinical Impression Statement  Patient reports improvement in the hip and back are feeling better. Her knee is painful. She tolerated gentle exercises well but has difficulty with some exercises requiring lifting against gravity. Brandy Jackson continues to respond well to DN and manual work.     Rehab Potential  Good    PT Frequency  2x / week    PT Duration  6 weeks    PT Treatment/Interventions  Patient/family education;ADLs/Self Care Home Management;Cryotherapy;Electrical Stimulation;Iontophoresis 4mg /ml Dexamethasone;Moist Heat;Ultrasound;Dry needling;Manual techniques;Neuromuscular re-education;Therapeutic activities;Therapeutic exercise    PT Next Visit Plan  stretching and strengthening exercises Rt hip and Lt knee; DN; manual work; modalities as indicated     Oncologist with Plan of Care  Patient       Patient will benefit from skilled therapeutic intervention in order to improve the following deficits and impairments:  Postural dysfunction, Improper body mechanics, Increased muscle spasms, Increased fascial restricitons, Decreased strength, Decreased range of motion, Decreased mobility, Decreased activity tolerance, Abnormal gait  Visit Diagnosis: Pain in right hip  Acute pain of left knee  Other symptoms and signs involving the musculoskeletal system  Other abnormalities of gait and mobility     Problem List Patient Active Problem List   Diagnosis Date Noted  . Hypertrophic cardiomyopathy ([place]) 10/04/2017  . Acute on chronic diastolic CHF (congestive heart failure) ([place]) 10/04/2017  . 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 Psoriatic arthritis ([place]) 07/31/2015  . Vitamin D deficiency 04/13/2015  . Mood changes 03/16/2015  . Anemia in neoplastic disease  09/15/2014  . Bilateral lower extremity edema 09/15/2014  . Malfunction of device 08/28/2014  . Laryngitis 08/18/2014  . Type II diabetes mellitus with manifestations ([place]) 08/04/2014  . Peripheral neuropathy due to chemotherapy ([place]) 08/04/2014  . Diarrhea 08/04/2014  . Fatigue due to treatment 08/04/2014  . Rectal bleed  06/30/2014  . Post-operative state 04/24/2014  . Breast cancer of upper-outer quadrant of right female breast ([place]) 03/03/2014    Celyn Nilda Simmer PT, MPH  11/13/2017, 4:15 PM  [facility] [place], [place], [address] Phone: [phone]   Fax:  [phone]  Name: RESSIE SLEVIN MRN: [id number] Date of Birth: 1951/02/14

## 2017-11-19 ENCOUNTER — Encounter: Payer: Self-pay | Admitting: Podiatry

## 2017-11-19 ENCOUNTER — Ambulatory Visit (INDEPENDENT_AMBULATORY_CARE_PROVIDER_SITE_OTHER): Payer: No Typology Code available for payment source | Admitting: Podiatry

## 2017-11-19 DIAGNOSIS — M79676 Pain in unspecified toe(s): Secondary | ICD-10-CM

## 2017-11-19 DIAGNOSIS — B351 Tinea unguium: Secondary | ICD-10-CM | POA: Diagnosis not present

## 2017-11-19 NOTE — Progress Notes (Signed)
She presents today chief complaint of painful elongated toenails and calluses.  Objective: Vital signs are stable she is alert and oriented x3.  Pulses are palpable.  Neurologic sensorium is intact.  Deep tendon reflexes are intact.  Muscle strength is normal.  Toenails are long thick yellow dystrophic with mycotic multiple calluses to the toes are noted.  No open lesions or wounds are noted.  Assessment: Pain in limb secondary to onychomycosis and reactive hyperkeratosis.  Plan: Debrided toenails 1 through 5 bilateral debrided all reactive hyperkeratosis.  Follow-up with her on an as-needed basis.

## 2017-11-25 ENCOUNTER — Ambulatory Visit (INDEPENDENT_AMBULATORY_CARE_PROVIDER_SITE_OTHER): Payer: Non-veteran care | Admitting: Rehabilitative and Restorative Service Providers"

## 2017-11-25 ENCOUNTER — Encounter: Payer: Self-pay | Admitting: Rehabilitative and Restorative Service Providers"

## 2017-11-25 DIAGNOSIS — R29898 Other symptoms and signs involving the musculoskeletal system: Secondary | ICD-10-CM

## 2017-11-25 DIAGNOSIS — R2689 Other abnormalities of gait and mobility: Secondary | ICD-10-CM

## 2017-11-25 DIAGNOSIS — M25562 Pain in left knee: Secondary | ICD-10-CM | POA: Diagnosis not present

## 2017-11-25 DIAGNOSIS — M25551 Pain in right hip: Secondary | ICD-10-CM

## 2017-11-25 NOTE — Patient Instructions (Signed)
HIP: Hamstrings - Supine  Place strap around foot. Raise leg up, keeping knee straight.  Bend opposite knee to protect back if indicated. Hold 30 seconds. 3 reps per set, 2-3 sets per day   Piriformis Stretch   Lying on back, pull right knee toward opposite shoulder. Hold 30 seconds. Repeat 3 times. Do 2-3 sessions per day.  Quads / HF, Prone KNEE: Quadriceps - Prone    Place strap around ankle. Bring ankle toward buttocks. Press hip into surface. Hold 30 seconds. Repeat 3 times per session. Do 2-3 sessions per day.

## 2017-11-25 NOTE — Therapy (Signed)
[place], [place], [address] Phone: [phone]   Fax:  [phone]  Physical Therapy Treatment  Patient Details  Name: Brandy Jackson MRN: [id number] Date of Birth: August 20, 1951 Referring Provider: Dr Dianah Field   Encounter Date: 11/25/2017  PT End of Session - 11/25/17 0931    Visit Number  4    Number of Visits  12    Date for PT Re-Evaluation  12/18/17    PT Start Time  0931    PT Stop Time  1028    PT Time Calculation (min)  57 min    Activity Tolerance  Patient tolerated treatment well       Past Medical History:  Diagnosis Date  . Breast cancer ([place])   . Cancer ([place])   . Diabetes mellitus without complication ([place])   . Hot flashes   . Psoriatic arthritis ([place])   . 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 Wears glasses   . Wears partial dentures    bottom    Past Surgical History:  Procedure Laterality Date  . BLADDER REPAIR W/ CESAREAN SECTION    . COLONOSCOPY    . PORTACATH PLACEMENT N/A 04/11/2014   Procedure: INSERTION PORT-A-CATH;  Surgeon: Joyice Faster. Cornett, MD;  Location: [facility];  Service: General;  Laterality: N/A;  . RE-EXCISION OF BREAST LUMPECTOMY Right 06/06/2014   Procedure: RE-EXCISION OF BREAST LUMPECTOMY;  Surgeon: Marcello Moores A. Cornett, MD;  Location: [facility];  Service: General;  Laterality: Right;    There were no vitals filed for this visit.  Subjective Assessment - 11/25/17 0932    Subjective  Patient reports that her Rt hip and pain are feeling some better. She has continued pain in the Lt knee > Rt knee. She has ben working on her exercises at home.     Currently in Pain?  Yes    Pain Score  6     Pain Location  Knee    Pain Orientation  Left;Right    Pain Descriptors / Indicators  Aching;Dull    Pain Radiating Towards  knee area bilat          [facility] PT Assessment - 11/25/17 0001       Assessment   Medical Diagnosis  LBP Rt hip pain     Referring Provider  Dr Dianah Field    Onset Date/Surgical Date  09/21/17    Hand Dominance  Right    Next MD Visit  PRN     [facility]  [facility]       AROM   Right/Left Hip  -- end range tightness bilat hips     Right/Left Knee  -- Rt knee WFL's     Left Knee Extension  -12    Left Knee Flexion  100      Strength   Left Knee Flexion  4/5    Left Knee Extension  4+/5      Flexibility   Hamstrings  tight Rt 85 deg; Lt 75 deg       Palpation   Palpation comment  tight Rt> Lt posterior hip musculature including piriformis, glut med/max; Lt hamstrings                   [facility] Adult PT Treatment/Exercise - 11/25/17 0001      Knee/Hip Exercises: Stretches   Passive Hamstring Stretch  Right;Left;2 reps;30 seconds supine with strap  Quad Stretch  Left;3 reps prone with strap       Knee/Hip Exercises: Aerobic   Nustep  L5 x 7 min       Knee/Hip Exercises: Supine   Quad Sets Limitations  leg lengthener tightening quads/hips with LE in extension 10 sec x 10 reps       Knee/Hip Exercises: Prone   Hip Extension  AROM;Strengthening;Right;Left;10 reps    Other Prone Exercises  glut set 10 sec x 10; pelvic press 5 sec x 10      Moist Heat Therapy   Number Minutes Moist Heat  20 Minutes    Moist Heat Location  Hip bilat hips; Lt hamstring       Electrical Stimulation   Electrical Stimulation Location  bialt posterior hips     Electrical Stimulation Action  IFC    Electrical Stimulation Parameters  to tolerance    Electrical Stimulation Goals  Pain;Tone      Manual Therapy   Manual Therapy  Soft tissue mobilization    Soft tissue mobilization  bilat posterior hips/buttocks, low back,  Lt hamstrings     Myofascial Release  posterior Rt hip/gluts        Trigger Point Dry Needling - 11/25/17 1015    Consent Given?  Yes    Muscles Treated Lower Body  -- bilat hips with estim     Gluteus Maximus  Response  Palpable increased muscle length    Gluteus Minimus Response  Palpable increased muscle length    Piriformis Response  Palpable increased muscle length           PT Education - 11/25/17 1016    Education provided  Yes    Education Details  HEP     Person(s) Educated  Patient    Methods  Explanation;Demonstration;Tactile cues;Verbal cues;Handout    Comprehension  Verbalized understanding;Returned demonstration;Verbal cues required          PT Long Term Goals - 11/25/17 0931      PT LONG TERM GOAL #1   Title  Increase strength bilat LE's to =/> 5-/5  - - 12/18/17    Time  6    Period  Weeks    Status  On-going      PT LONG TERM GOAL #2   Title  Decrease pain Rt hip by 50-75% aloowing patient to return to normal functional activities 12/18/17    Time  6    Period  Weeks    Status  On-going      PT LONG TERM GOAL #3   Title  Improve gait pattern with patient to ambulate with improved wt shift and wt bearing for functional distances =/> 400-500 ft 12/18/17    Time  6    Period  Weeks    Status  On-going      PT LONG TERM GOAL #4   Title  Independent in HEP 12/18/17    Time  6    Period  Weeks    Status  On-going      PT LONG TERM GOAL #5   Title  Improve FOTO to </= 38% limitation 12/18/17    Time  6    Period  Weeks    Status  On-going            Plan - 11/25/17 0936    Clinical Impression Statement  Panhia demonstrates improvement in gait pattern but continues to have significant valgus deformity at bilat knees. She has difficulty tolerating much  exercise - does very well with Nustep. She has some improvement in muscular tightness to palpation through the Rt hip.     Rehab Potential  Good    PT Frequency  2x / week    PT Duration  6 weeks    PT Treatment/Interventions  Patient/family education;ADLs/Self Care Home Management;Cryotherapy;Electrical Stimulation;Iontophoresis 4mg /ml Dexamethasone;Moist Heat;Ultrasound;Dry needling;Manual  techniques;Neuromuscular re-education;Therapeutic activities;Therapeutic exercise    PT Next Visit Plan  stretching and strengthening exercises Rt hip and Lt knee; DN; manual work; modalities as indicated     Oncologist with Plan of Care  Patient       Patient will benefit from skilled therapeutic intervention in order to improve the following deficits and impairments:  Postural dysfunction, Improper body mechanics, Increased muscle spasms, Increased fascial restricitons, Decreased strength, Decreased range of motion, Decreased mobility, Decreased activity tolerance, Abnormal gait  Visit Diagnosis: Pain in right hip  Acute pain of left knee  Other symptoms and signs involving the musculoskeletal system  Other abnormalities of gait and mobility     Problem List Patient Active Problem List   Diagnosis Date Noted  . Hypertrophic cardiomyopathy ([place]) 10/04/2017  . Acute on chronic diastolic CHF (congestive heart failure) ([place]) 10/04/2017  . 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 Psoriatic arthritis ([place]) 07/31/2015  . Vitamin D deficiency 04/13/2015  . Mood changes 03/16/2015  . 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 Bilateral lower extremity edema 09/15/2014  . Malfunction of device 08/28/2014  . Laryngitis 08/18/2014  . Type II diabetes mellitus with manifestations ([place]) 08/04/2014  . Peripheral neuropathy due to chemotherapy ([place]) 08/04/2014  . Diarrhea 08/04/2014  . Fatigue due to treatment 08/04/2014  . Rectal bleed 06/30/2014  . Post-operative state 04/24/2014  . Breast cancer of upper-outer quadrant of right female breast ([place]) 03/03/2014    Celyn Nilda Simmer PT, MPH  11/25/2017, 10:17 AM  [facility] [place], [place], [address] Phone: [phone]   Fax:  [phone]  Name: Brandy Jackson MRN: [id number] Date of Birth: November 29, 1951

## 2017-11-27 ENCOUNTER — Ambulatory Visit (INDEPENDENT_AMBULATORY_CARE_PROVIDER_SITE_OTHER): Payer: Non-veteran care | Admitting: Rehabilitative and Restorative Service Providers"

## 2017-11-27 ENCOUNTER — Encounter: Payer: Self-pay | Admitting: Rehabilitative and Restorative Service Providers"

## 2017-11-27 DIAGNOSIS — R29898 Other symptoms and signs involving the musculoskeletal system: Secondary | ICD-10-CM | POA: Diagnosis not present

## 2017-11-27 DIAGNOSIS — M25562 Pain in left knee: Secondary | ICD-10-CM | POA: Diagnosis not present

## 2017-11-27 DIAGNOSIS — R2689 Other abnormalities of gait and mobility: Secondary | ICD-10-CM | POA: Diagnosis not present

## 2017-11-27 DIAGNOSIS — M25551 Pain in right hip: Secondary | ICD-10-CM

## 2017-11-27 NOTE — Patient Instructions (Addendum)
Strengthening: Hip Abductor - Resisted    With band looped around both legs above knees, hold one knee still and move the opposite knee out to the side, pushing thigh out to the side. Repeat __10_ times per set. Do __2-3__ sets per session. Do __1__ sessions per day.   Abduction: Clam (Eccentric) - Side-Lying    Lie on side with knees bent. Lift top knee, keeping feet together. With green band secured above knees. Keep trunk steady. Slowly lower for 3-5 seconds. __10_ reps per set, _2-3__ sets  1 time/per day

## 2017-11-27 NOTE — Therapy (Signed)
[place], [place], [address] Phone: [phone]   Fax:  [phone]  Physical Therapy Treatment  Patient Details  Name: Brandy Jackson MRN: [id number] Date of Birth: October 03, 1951 Referring Provider: Dr Dianah Field   Encounter Date: 11/27/2017  PT End of Session - 11/27/17 1118    Visit Number  5    Number of Visits  12    Date for PT Re-Evaluation  12/18/17    PT Start Time  1116    PT Stop Time  1209    PT Time Calculation (min)  53 min    Activity Tolerance  Patient tolerated treatment well       Past Medical History:  Diagnosis Date  . Breast cancer ([facility])   . Cancer ([place])   . Diabetes mellitus without complication ([place])   . Hot flashes   . Psoriatic arthritis ([place])   . 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 Wears glasses   . Wears partial dentures    bottom    Past Surgical History:  Procedure Laterality Date  . BLADDER REPAIR W/ CESAREAN SECTION    . COLONOSCOPY    . PORTACATH PLACEMENT N/A 04/11/2014   Procedure: INSERTION PORT-A-CATH;  Surgeon: Joyice Faster. Cornett, MD;  Location: [facility];  Service: General;  Laterality: N/A;  . RE-EXCISION OF BREAST LUMPECTOMY Right 06/06/2014   Procedure: RE-EXCISION OF BREAST LUMPECTOMY;  Surgeon: Marcello Moores A. Cornett, MD;  Location: [facility];  Service: General;  Laterality: Right;    There were no vitals filed for this visit.  Subjective Assessment - 11/27/17 1119    Subjective  Patient reports that she feels the DN was very effective but she is still somewhat sore from the needling. Wants to work on exercise and do the heat today. Brandy Jackson loves the [employer]. She tried to find one to buy for home yesterday but was unable to find one. Her husband checked out the Nustep in the clinic today and will try to find one for home.     Currently in Pain?  Yes    Pain Score  7     Pain Location  Knee     Pain Orientation  Left    Pain Descriptors / Indicators  Aching;Dull    Pain Type  Chronic pain    Pain Onset  More than a month ago    Pain Frequency  Intermittent    Aggravating Factors   walking; standing after having been sitting     Pain Relieving Factors  resting                       [facility] Adult PT Treatment/Exercise - 11/27/17 0001      Knee/Hip Exercises: Stretches   Passive Hamstring Stretch  Right;Left;2 reps;30 seconds supine with strap     Quad Stretch  Left;3 reps prone with strap       Knee/Hip Exercises: Aerobic   Nustep  L5 x 12 min       Knee/Hip Exercises: Supine   Quad Sets Limitations  quad sets with LE extended 10 sec x 10    Other Supine Knee/Hip Exercises  clam holding one leg still and moving opposite 10 reps each LE green TB       Knee/Hip Exercises: Sidelying   Clams  green TB x 10 reps each side       Knee/Hip Exercises: Prone  Hip Extension  AROM;Strengthening;Right;Left;10 reps    Other Prone Exercises  glut set 10 sec x 10; pelvic press 5 sec x 10      Moist Heat Therapy   Number Minutes Moist Heat  20 Minutes    Moist Heat Location  Hip bilat hips; Lt hamstring       Electrical Stimulation   Electrical Stimulation Location  bialt posterior hips     Electrical Stimulation Action  IFC    Electrical Stimulation Parameters  to tolerance    Electrical Stimulation Goals  Pain;Tone             PT Education - 11/27/17 1144    Education provided  Yes    Education Details  HEP     Person(s) Educated  Patient    Methods  Explanation;Demonstration;Tactile cues;Verbal cues;Handout    Comprehension  Verbalized understanding;Returned demonstration;Verbal cues required;Tactile cues required          PT Long Term Goals - 11/25/17 0931      PT LONG TERM GOAL #1   Title  Increase strength bilat LE's to =/> 5-/5  - - 12/18/17    Time  6    Period  Weeks    Status  On-going      PT LONG TERM GOAL #2   Title  Decrease  pain Rt hip by 50-75% aloowing patient to return to normal functional activities 12/18/17    Time  6    Period  Weeks    Status  On-going      PT LONG TERM GOAL #3   Title  Improve gait pattern with patient to ambulate with improved wt shift and wt bearing for functional distances =/> 400-500 ft 12/18/17    Time  6    Period  Weeks    Status  On-going      PT LONG TERM GOAL #4   Title  Independent in HEP 12/18/17    Time  6    Period  Weeks    Status  On-going      PT LONG TERM GOAL #5   Title  Improve FOTO to </= 38% limitation 12/18/17    Time  6    Period  Weeks    Status  On-going            Plan - 11/27/17 1121    Clinical Impression Statement  Brandy Jackson does well with Nustep and loves this exercise. She has some difficulty with tolerating other LE exercises. She has greatest pain in lateral Lt knee. Structural changes in LE alignment contribute to pain and dysfunction.     Rehab Potential  Good    PT Frequency  2x / week    PT Duration  6 weeks    PT Treatment/Interventions  Patient/family education;ADLs/Self Care Home Management;Cryotherapy;Electrical Stimulation;Iontophoresis 4mg /ml Dexamethasone;Moist Heat;Ultrasound;Dry needling;Manual techniques;Neuromuscular re-education;Therapeutic activities;Therapeutic exercise    PT Next Visit Plan  stretching and strengthening exercises Rt hip and Lt knee; DN; manual work; modalities as indicated     Oncologist with Plan of Care  Patient       Patient will benefit from skilled therapeutic intervention in order to improve the following deficits and impairments:  Postural dysfunction, Improper body mechanics, Increased muscle spasms, Increased fascial restricitons, Decreased strength, Decreased range of motion, Decreased mobility, Decreased activity tolerance, Abnormal gait  Visit Diagnosis: Pain in right hip  Acute pain of left knee  Other symptoms and signs involving the musculoskeletal system  Other  abnormalities  of gait and mobility     Problem List Patient Active Problem List   Diagnosis Date Noted  . Hypertrophic cardiomyopathy ([facility]) 10/04/2017  . Acute on chronic diastolic CHF (congestive heart failure) ([place]) 10/04/2017  . 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 Psoriatic arthritis ([facility]) 07/31/2015  . Vitamin D deficiency 04/13/2015  . Mood changes 03/16/2015  . 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 Bilateral lower extremity edema 09/15/2014  . Malfunction of device 08/28/2014  . Laryngitis 08/18/2014  . Type II diabetes mellitus with manifestations ([facility]) 08/04/2014  . Peripheral neuropathy due to chemotherapy ([facility] [facility]) 08/04/2014  . Diarrhea 08/04/2014  . Fatigue due to treatment 08/04/2014  . Rectal bleed 06/30/2014  . Post-operative state 04/24/2014  . Breast cancer of upper-outer quadrant of right female breast ([place]) 03/03/2014    Brandy Jackson Nilda Simmer PT, MPH  11/27/2017, 12:00 PM  [facility] [place], [place], [address] Phone: [phone]   Fax:  [phone]  Name: Brandy Jackson MRN: [id number] Date of Birth: [birth year]

## 2017-12-03 ENCOUNTER — Encounter: Payer: No Typology Code available for payment source | Admitting: Rehabilitative and Restorative Service Providers"

## 2017-12-09 ENCOUNTER — Ambulatory Visit (INDEPENDENT_AMBULATORY_CARE_PROVIDER_SITE_OTHER): Payer: Non-veteran care | Admitting: Rehabilitative and Restorative Service Providers"

## 2017-12-09 ENCOUNTER — Encounter: Payer: Self-pay | Admitting: Rehabilitative and Restorative Service Providers"

## 2017-12-09 DIAGNOSIS — R29898 Other symptoms and signs involving the musculoskeletal system: Secondary | ICD-10-CM | POA: Diagnosis not present

## 2017-12-09 DIAGNOSIS — M25551 Pain in right hip: Secondary | ICD-10-CM

## 2017-12-09 DIAGNOSIS — M25562 Pain in left knee: Secondary | ICD-10-CM

## 2017-12-09 DIAGNOSIS — R2689 Other abnormalities of gait and mobility: Secondary | ICD-10-CM | POA: Diagnosis not present

## 2017-12-09 NOTE — Therapy (Signed)
[place], [place], [address] Phone: [phone]   Fax:  [phone]  Physical Therapy Treatment  Patient Details  Name: Brandy Jackson MRN: [id number] Date of Birth: 1951/11/14 Referring Provider: Dr Dianah Field    Encounter Date: 12/09/2017  PT End of Session - 12/09/17 1117    Visit Number  6    Number of Visits  12    Date for PT Re-Evaluation  12/18/17    PT Start Time  1114    PT Stop Time  1218    PT Time Calculation (min)  64 min    Activity Tolerance  Patient tolerated treatment well       Past Medical History:  Diagnosis Date  . Breast cancer ([facility])   . Cancer ([facility] [place])   . Diabetes mellitus without complication ([place])   . Hot flashes   . Psoriatic arthritis ([place])   . 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 Wears glasses   . Wears partial dentures    bottom    Past Surgical History:  Procedure Laterality Date  . BLADDER REPAIR W/ CESAREAN SECTION    . COLONOSCOPY    . PORTACATH PLACEMENT N/A 04/11/2014   Procedure: INSERTION PORT-A-CATH;  Surgeon: Joyice Faster. Cornett, MD;  Location: [facility];  Service: General;  Laterality: N/A;  . RE-EXCISION OF BREAST LUMPECTOMY Right 06/06/2014   Procedure: RE-EXCISION OF BREAST LUMPECTOMY;  Surgeon: Marcello Moores A. Cornett, MD;  Location: [facility];  Service: General;  Laterality: Right;    There were no vitals filed for this visit.  Subjective Assessment - 12/09/17 1119    Subjective  Patient reports that she is trying to walk more and stretch. She did OK without the dry needling but can tell it's time to do the dry needling again. Lt knee does not hurt all the time. She feels it when she has been sitting and gets up to walk. She has some soreness in the Rt hip but not pain.     Currently in Pain?  Yes    Pain Score  4     Pain Location  Knee    Pain Orientation  Left    Pain Descriptors /  Indicators  Aching;Dull    Pain Type  Chronic pain    Pain Onset  More than a month ago    Pain Frequency  Intermittent         [facility] PT Assessment - 12/09/17 0001      Assessment   Medical Diagnosis  LBP Rt hip pain     Referring Provider  Dr Dianah Field     Onset Date/Surgical Date  09/21/17    Hand Dominance  Right    Next MD Visit  PRN     Prior Therapy  yes [facility] x 1 visit       AROM   Right/Left Hip  -- end range tightness bilat hips     Right/Left Knee  -- Rt knee WFL's     Left Knee Extension  -10    Left Knee Flexion  102      Strength   Left Knee Flexion  4/5    Left Knee Extension  4+/5      Palpation   Palpation comment  tight Rt> Lt posterior hip musculature including piriformis, glut med/max; Lt hamstrings  [facility] Adult PT Treatment/Exercise - 12/09/17 0001      Knee/Hip Exercises: Aerobic   Nustep  L5 x 10 min - arms at 10       Knee/Hip Exercises: Standing   Hip ADduction  AROM;Strengthening;Right;Left;2 sets;10 reps    Forward Step Up  Right;Left;2 sets;10 reps;Hand Hold: 2;Step Height: 4"      Moist Heat Therapy   Number Minutes Moist Heat  20 Minutes    Moist Heat Location  Hip bilat hips; Lt hamstring       Electrical Stimulation   Electrical Stimulation Location  bialt posterior hips     Electrical Stimulation Action  IFC    Electrical Stimulation Parameters  to tolerance    Electrical Stimulation Goals  Pain;Tone      Manual Therapy   Manual Therapy  Soft tissue mobilization    Manual therapy comments  pt prone     Soft tissue mobilization  bilat posterior hips/buttocks,  Lt hamstrings     Myofascial Release  posterior hip/gluts        Trigger Point Dry Needling - 12/09/17 1201    Consent Given?  Yes    Muscles Treated Lower Body  -- bilat hips with estim Lt ITB     Gluteus Maximus Response  Palpable increased muscle length    Gluteus Minimus Response  Palpable increased muscle length    Piriformis  Response  Palpable increased muscle length                PT Long Term Goals - 12/09/17 1118      PT LONG TERM GOAL #1   Title  Increase strength bilat LE's to =/> 5-/5  - - 12/18/17    Time  6    Period  Weeks    Status  On-going      PT LONG TERM GOAL #2   Title  Decrease pain Rt hip by 50-75% aloowing patient to return to normal functional activities 12/18/17    Time  6    Period  Weeks    Status  On-going      PT LONG TERM GOAL #3   Title  Improve gait pattern with patient to ambulate with improved wt shift and wt bearing for functional distances =/> 400-500 ft 12/18/17    Time  6    Period  Weeks    Status  On-going      PT LONG TERM GOAL #4   Title  Independent in HEP 12/18/17    Time  6    Period  Weeks    Status  On-going      PT LONG TERM GOAL #5   Title  Improve FOTO to </= 38% limitation 12/18/17    Time  6    Period  Weeks    Status  On-going            Plan - 12/09/17 1122    Clinical Impression Statement  Continued improvement in functional activity level. Patient also reports that pain is improving. She is working on exercises at home. Patient is progressing gradually toward stated goals of therapy. Full resolution of pain is not a realistic goal due to chronic nature of symptoms and structural changes through LE's     Rehab Potential  Good    PT Frequency  2x / week    PT Duration  6 weeks    PT Treatment/Interventions  Patient/family education;ADLs/Self Care Home Management;Cryotherapy;Electrical Stimulation;Iontophoresis 4mg /ml Dexamethasone;Moist Heat;Ultrasound;Dry needling;Manual techniques;Neuromuscular  re-education;Therapeutic activities;Therapeutic exercise    PT Next Visit Plan  stretching and strengthening exercises Rt hip and Lt knee; DN; manual work; modalities as indicated     Oncologist with Plan of Care  Patient       Patient will benefit from skilled therapeutic intervention in order to improve the following deficits  and impairments:  Postural dysfunction, Improper body mechanics, Increased muscle spasms, Increased fascial restricitons, Decreased strength, Decreased range of motion, Decreased mobility, Decreased activity tolerance, Abnormal gait  Visit Diagnosis: Pain in right hip  Acute pain of left knee  Other symptoms and signs involving the musculoskeletal system  Other abnormalities of gait and mobility     Problem List Patient Active Problem List   Diagnosis Date Noted  . Hypertrophic cardiomyopathy ([place]) 10/04/2017  . Acute on chronic diastolic CHF (congestive heart failure) ([place]) 10/04/2017  . 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 Psoriatic arthritis ([place]) 07/31/2015  . Vitamin D deficiency 04/13/2015  . Mood changes 03/16/2015  . 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 Bilateral lower extremity edema 09/15/2014  . Malfunction of device 08/28/2014  . Laryngitis 08/18/2014  . Type II diabetes mellitus with manifestations ([place]) 08/04/2014  . Peripheral neuropathy due to chemotherapy ([place]) 08/04/2014  . Diarrhea 08/04/2014  . Fatigue due to treatment 08/04/2014  . Rectal bleed 06/30/2014  . Post-operative state 04/24/2014  . Breast cancer of upper-outer quadrant of right female breast ([place]) 03/03/2014    Chadric Kimberley Nilda Simmer PT, MPH  12/09/2017, 12:07 PM  [facility] [place], [place], [address] Phone: [phone]   Fax:  [phone]  Name: Brandy Jackson MRN: [id number] Date of Birth: August 06, 1951

## 2017-12-11 ENCOUNTER — Ambulatory Visit (INDEPENDENT_AMBULATORY_CARE_PROVIDER_SITE_OTHER): Payer: Non-veteran care | Admitting: Rehabilitative and Restorative Service Providers"

## 2017-12-11 ENCOUNTER — Encounter: Payer: Self-pay | Admitting: Rehabilitative and Restorative Service Providers"

## 2017-12-11 DIAGNOSIS — M25551 Pain in right hip: Secondary | ICD-10-CM | POA: Diagnosis not present

## 2017-12-11 DIAGNOSIS — M25562 Pain in left knee: Secondary | ICD-10-CM

## 2017-12-11 DIAGNOSIS — R29898 Other symptoms and signs involving the musculoskeletal system: Secondary | ICD-10-CM | POA: Diagnosis not present

## 2017-12-11 DIAGNOSIS — R2689 Other abnormalities of gait and mobility: Secondary | ICD-10-CM | POA: Diagnosis not present

## 2017-12-11 NOTE — Therapy (Signed)
[place], [place], [address] Phone: [phone]   Fax:  [phone]  Physical Therapy Treatment  Patient Details  Name: Brandy Jackson MRN: [id number] Date of Birth: [birth year] Referring Provider: Dr Dianah Field    Encounter Date: 12/11/2017  PT End of Session - 12/11/17 1105    Visit Number  7    Number of Visits  12    Date for PT Re-Evaluation  12/18/17    PT Start Time  1100    PT Stop Time  1158    PT Time Calculation (min)  58 min    Activity Tolerance  Patient tolerated treatment well       Past Medical History:  Diagnosis Date  . Breast cancer ([place])   . Cancer ([place])   . Diabetes mellitus without complication ([place])   . Hot flashes   . Psoriatic arthritis ([place])   . 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 Wears glasses   . Wears partial dentures    bottom    Past Surgical History:  Procedure Laterality Date  . BLADDER REPAIR W/ CESAREAN SECTION    . COLONOSCOPY    . PORTACATH PLACEMENT N/A 04/11/2014   Procedure: INSERTION PORT-A-CATH;  Surgeon: Joyice Faster. Cornett, MD;  Location: [facility];  Service: General;  Laterality: N/A;  . RE-EXCISION OF BREAST LUMPECTOMY Right 06/06/2014   Procedure: RE-EXCISION OF BREAST LUMPECTOMY;  Surgeon: Marcello Moores A. Cornett, MD;  Location: [facility];  Service: General;  Laterality: Right;    There were no vitals filed for this visit.  Subjective Assessment - 12/11/17 1108    Subjective  Brandy Jackson reports that she has incresaed stiffness in the Lt knee today. Feel the DN helps.     Currently in Pain?  Yes    Pain Score  6     Pain Location  Knee    Pain Orientation  Left    Pain Descriptors / Indicators  Aching;Dull    Pain Type  Chronic pain    Pain Onset  More than a month ago    Pain Frequency  Intermittent                      [facility] Adult PT Treatment/Exercise - 12/11/17 0001       Knee/Hip Exercises: Stretches   Passive Hamstring Stretch  Right;Left;2 reps;30 seconds supine with strap    Quad Stretch  Left;3 reps prone with strap       Knee/Hip Exercises: Aerobic   Nustep  L5 x 10 min - arms at 10       Knee/Hip Exercises: Standing   Hip ADduction  AROM;Strengthening;Right;Left;2 sets;10 reps    Forward Step Up  Right;Left;2 sets;10 reps;Hand Hold: 2;Step Height: 4"      Moist Heat Therapy   Number Minutes Moist Heat  20 Minutes    Moist Heat Location  Hip bilat hips; Lt hamstring       Electrical Stimulation   Electrical Stimulation Location  bialt posterior hips     Electrical Stimulation Action  IFC    Electrical Stimulation Parameters  to tolerance    Electrical Stimulation Goals  Pain;Tone      Manual Therapy   Manual Therapy  Soft tissue mobilization    Manual therapy comments  pt prone     Soft tissue mobilization  bilat posterior hips/buttocks,  Lt hamstrings     Myofascial Release  posterior hip/gluts                   PT Long Term Goals - 12/09/17 1118      PT LONG TERM GOAL #1   Title  Increase strength bilat LE's to =/> 5-/5  - - 12/18/17    Time  6    Period  Weeks    Status  On-going      PT LONG TERM GOAL #2   Title  Decrease pain Rt hip by 50-75% aloowing patient to return to normal functional activities 12/18/17    Time  6    Period  Weeks    Status  On-going      PT LONG TERM GOAL #3   Title  Improve gait pattern with patient to ambulate with improved wt shift and wt bearing for functional distances =/> 400-500 ft 12/18/17    Time  6    Period  Weeks    Status  On-going      PT LONG TERM GOAL #4   Title  Independent in HEP 12/18/17    Time  6    Period  Weeks    Status  On-going      PT LONG TERM GOAL #5   Title  Improve FOTO to </= 38% limitation 12/18/17    Time  6    Period  Weeks    Status  On-going            Plan - 12/11/17 1105    Clinical Impression Statement  Continued pain in the Lt  knee. Symptoms are long standing in the Rt knee. She has received an injection in the Rt knee but that didn't help. DN is helpful for the hip and thigh. She demonstrates improving strength and function.     Rehab Potential  Good    PT Frequency  2x / week    PT Duration  6 weeks    PT Treatment/Interventions  Patient/family education;ADLs/Self Care Home Management;Cryotherapy;Electrical Stimulation;Iontophoresis 4mg /ml Dexamethasone;Moist Heat;Ultrasound;Dry needling;Manual techniques;Neuromuscular re-education;Therapeutic activities;Therapeutic exercise    PT Next Visit Plan  stretching and strengthening exercises Rt hip and Lt knee; DN; manual work; modalities as indicated     Oncologist with Plan of Care  Patient       Patient will benefit from skilled therapeutic intervention in order to improve the following deficits and impairments:  Postural dysfunction, Improper body mechanics, Increased muscle spasms, Increased fascial restricitons, Decreased strength, Decreased range of motion, Decreased mobility, Decreased activity tolerance, Abnormal gait  Visit Diagnosis: Pain in right hip  Acute pain of left knee  Other symptoms and signs involving the musculoskeletal system  Other abnormalities of gait and mobility     Problem List Patient Active Problem List   Diagnosis Date Noted  . Hypertrophic cardiomyopathy ([place]) 10/04/2017  . Acute on chronic diastolic CHF (congestive heart failure) ([place]) 10/04/2017  . 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 Psoriatic arthritis ([place]) 07/31/2015  . Vitamin D deficiency 04/13/2015  . Mood changes 03/16/2015  . 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 Bilateral lower extremity edema 09/15/2014  . Malfunction of device 08/28/2014  . Laryngitis 08/18/2014  . Type II diabetes mellitus with manifestations ([place]) 08/04/2014  . Peripheral neuropathy due to chemotherapy ([place]) 08/04/2014  . Diarrhea 08/04/2014  . Fatigue due to treatment  08/04/2014  . Rectal bleed 06/30/2014  . Post-operative state 04/24/2014  . Breast cancer of upper-outer quadrant of right female breast ([place]) 03/03/2014  Celyn Nilda Simmer PT, MPH  12/11/2017, 11:13 AM  [facility] [place], [place], [address] Phone: [phone]   Fax:  [phone]  Name: Brandy Jackson MRN: [id number] Date of Birth: Sep 18, 1951

## 2017-12-17 ENCOUNTER — Ambulatory Visit (INDEPENDENT_AMBULATORY_CARE_PROVIDER_SITE_OTHER): Payer: No Typology Code available for payment source | Admitting: Rehabilitative and Restorative Service Providers"

## 2017-12-17 ENCOUNTER — Encounter: Payer: Self-pay | Admitting: Rehabilitative and Restorative Service Providers"

## 2017-12-17 DIAGNOSIS — R2689 Other abnormalities of gait and mobility: Secondary | ICD-10-CM | POA: Diagnosis not present

## 2017-12-17 DIAGNOSIS — M25551 Pain in right hip: Secondary | ICD-10-CM

## 2017-12-17 DIAGNOSIS — M25562 Pain in left knee: Secondary | ICD-10-CM | POA: Diagnosis not present

## 2017-12-17 DIAGNOSIS — R29898 Other symptoms and signs involving the musculoskeletal system: Secondary | ICD-10-CM

## 2017-12-17 NOTE — Therapy (Signed)
[place], [place], [address] Phone: [phone]   Fax:  [phone]  Physical Therapy Treatment  Patient Details  Name: Brandy Jackson MRN: [id number] Date of Birth: [birth year] Referring Provider: Dr Dianah Field    Encounter Date: 12/17/2017  PT End of Session - 12/17/17 1616    Visit Number  8    Number of Visits  12    Date for PT Re-Evaluation  12/18/17    PT Start Time  3474    PT Stop Time  1712    PT Time Calculation (min)  58 min    Activity Tolerance  Patient tolerated treatment well       Past Medical History:  Diagnosis Date  . Breast cancer ([place])   . Cancer ([place])   . Diabetes mellitus without complication ([place])   . Hot flashes   . Psoriatic arthritis ([place])   . 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 Wears glasses   . Wears partial dentures    bottom    Past Surgical History:  Procedure Laterality Date  . BLADDER REPAIR W/ CESAREAN SECTION    . COLONOSCOPY    . PORTACATH PLACEMENT N/A 04/11/2014   Procedure: INSERTION PORT-A-CATH;  Surgeon: Joyice Faster. Cornett, MD;  Location: [facility];  Service: General;  Laterality: N/A;  . RE-EXCISION OF BREAST LUMPECTOMY Right 06/06/2014   Procedure: RE-EXCISION OF BREAST LUMPECTOMY;  Surgeon: Marcello Moores A. Cornett, MD;  Location: [facility];  Service: General;  Laterality: Right;    There were no vitals filed for this visit.  Subjective Assessment - 12/17/17 1617    Subjective  [name] reports that her Lt knee is stiff after she does a lot of sitting. She did a lot of walking yesterday. Rt hip is about a 1/10 - not much pain. Excited that she found a stepper to order on line so she cn use the stepper at home.     Currently in Pain?  Yes    Pain Score  4     Pain Location  Knee    Pain Orientation  Left    Pain Descriptors / Indicators  Aching;Dull    Pain Type  Chronic pain    Pain  Onset  More than a month ago    Pain Frequency  Intermittent                      [facility] Adult PT Treatment/Exercise - 12/17/17 0001      Knee/Hip Exercises: Stretches   Gastroc Stretch  Left;Right;3 reps;30 seconds      Knee/Hip Exercises: Aerobic   Nustep  L5 x 15 min - arms at 10       Knee/Hip Exercises: Standing   Hip ADduction  AROM;Strengthening;Right;Left;2 sets;10 reps    Hip Extension  AROM;Stengthening;Right;Left;10 reps;Knee straight hand hold assist for safety     Forward Step Up  Right;Left;2 sets;10 reps;Hand Hold: 2;Step Height: 4"    Wall Squat  1 set;10 reps with ball pt reports knee pain with this exercise       Moist Heat Therapy   Number Minutes Moist Heat  20 Minutes    Moist Heat Location  Hip bilat hips; Lt hamstring       Electrical Stimulation   Electrical Stimulation Location  bialt posterior hips     Electrical Stimulation Action  IFC    Electrical Stimulation Parameters  to tolerance  Electrical Stimulation Goals  Pain;Tone      Manual Therapy   Manual Therapy  Soft tissue mobilization    Manual therapy comments  pt prone     Soft tissue mobilization  bilat posterior hips/buttocks,  Lt hamstrings     Myofascial Release  posterior hip/gluts        Trigger Point Dry Needling - 12/17/17 1701    Consent Given?  Yes    Muscles Treated Lower Body  -- bilat hips estim Rt only DN bilat Lt 2 needles only     Gluteus Maximus Response  Palpable increased muscle length    Gluteus Minimus Response  Palpable increased muscle length    Piriformis Response  Palpable increased muscle length                PT Long Term Goals - 12/17/17 1621      PT LONG TERM GOAL #1   Title  Increase strength bilat LE's to =/> 5-/5  - - 12/18/17    Time  6    Period  Weeks    Status  On-going      PT LONG TERM GOAL #2   Title  Decrease pain Rt hip by 50-75% aloowing patient to return to normal functional activities 12/18/17    Time  6    Period   Weeks    Status  On-going      PT LONG TERM GOAL #3   Title  Improve gait pattern with patient to ambulate with improved wt shift and wt bearing for functional distances =/> 400-500 ft 12/18/17    Time  6    Period  Weeks    Status  On-going      PT LONG TERM GOAL #4   Title  Independent in HEP 12/18/17    Time  6    Period  Weeks    Status  On-going      PT LONG TERM GOAL #5   Title  Improve FOTO to </= 38% limitation 12/18/17    Time  6    Period  Weeks    Status  On-going            Plan - 12/17/17 1619    Clinical Impression Statement  Lt knee pain persists. Hip is improving. Patient is more active and is walking more at home and in the community. She is not sure that the injection in the Lt knee has improved anything.     Rehab Potential  Good    PT Frequency  2x / week    PT Duration  6 weeks    PT Treatment/Interventions  Patient/family education;ADLs/Self Care Home Management;Cryotherapy;Electrical Stimulation;Iontophoresis 4mg /ml Dexamethasone;Moist Heat;Ultrasound;Dry needling;Manual techniques;Neuromuscular re-education;Therapeutic activities;Therapeutic exercise    PT Next Visit Plan  stretching and strengthening exercises Rt hip and Lt knee; DN; manual work; modalities as indicated; trial of hip abductors in supine and sidelying with TB    Consulted and Agree with Plan of Care  Patient       Patient will benefit from skilled therapeutic intervention in order to improve the following deficits and impairments:  Postural dysfunction, Improper body mechanics, Increased muscle spasms, Increased fascial restricitons, Decreased strength, Decreased range of motion, Decreased mobility, Decreased activity tolerance, Abnormal gait  Visit Diagnosis: Pain in right hip  Acute pain of left knee  Other symptoms and signs involving the musculoskeletal system  Other abnormalities of gait and mobility     Problem List Patient Active Problem List  Diagnosis Date Noted   . Hypertrophic cardiomyopathy ([place]) 10/04/2017  . Acute on chronic diastolic CHF (congestive heart failure) ([place]) 10/04/2017  . 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 Psoriatic arthritis ([place]) 07/31/2015  . Vitamin D deficiency 04/13/2015  . Mood changes 03/16/2015  . 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 Bilateral lower extremity edema 09/15/2014  . Malfunction of device 08/28/2014  . Laryngitis 08/18/2014  . Type II diabetes mellitus with manifestations ([place]) 08/04/2014  . Peripheral neuropathy due to chemotherapy ([place]) 08/04/2014  . Diarrhea 08/04/2014  . Fatigue due to treatment 08/04/2014  . Rectal bleed 06/30/2014  . Post-operative state 04/24/2014  . Breast cancer of upper-outer quadrant of right female breast ([place]) 03/03/2014    Lisbet Busker Nilda Simmer PT, MPH  12/17/2017, 5:04 PM  [facility] [place], [place], [address] Phone: [phone]   Fax:  [phone]  Name: MCKYLA DECKMAN MRN: [id number] Date of Birth: [birth year]

## 2017-12-25 ENCOUNTER — Encounter: Payer: Self-pay | Admitting: Rehabilitative and Restorative Service Providers"

## 2017-12-25 ENCOUNTER — Ambulatory Visit (INDEPENDENT_AMBULATORY_CARE_PROVIDER_SITE_OTHER): Payer: No Typology Code available for payment source | Admitting: Rehabilitative and Restorative Service Providers"

## 2017-12-25 DIAGNOSIS — M25562 Pain in left knee: Secondary | ICD-10-CM | POA: Diagnosis not present

## 2017-12-25 DIAGNOSIS — M25551 Pain in right hip: Secondary | ICD-10-CM

## 2017-12-25 DIAGNOSIS — R29898 Other symptoms and signs involving the musculoskeletal system: Secondary | ICD-10-CM | POA: Diagnosis not present

## 2017-12-25 DIAGNOSIS — R2689 Other abnormalities of gait and mobility: Secondary | ICD-10-CM | POA: Diagnosis not present

## 2017-12-25 NOTE — Therapy (Signed)
[place], [place], [address] Phone: [phone]   Fax:  [phone]  Physical Therapy Treatment  Patient Details  Name: Brandy Jackson MRN: [id number] Date of Birth: Mar 11, 1951 Referring Provider: Dr Dianah Field    Encounter Date: 12/25/2017  PT End of Session - 12/25/17 1534    Visit Number  9    Number of Visits  12    Date for PT Re-Evaluation  12/18/17    PT Start Time  5427    PT Stop Time  1629    PT Time Calculation (min)  56 min    Activity Tolerance  Patient tolerated treatment well       Past Medical History:  Diagnosis Date  . Breast cancer ([place])   . Cancer ([place])   . Diabetes mellitus without complication ([place])   . Hot flashes   . Psoriatic arthritis ([place])   . 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 Wears glasses   . Wears partial dentures    bottom    Past Surgical History:  Procedure Laterality Date  . BLADDER REPAIR W/ CESAREAN SECTION    . COLONOSCOPY    . PORTACATH PLACEMENT N/A 04/11/2014   Procedure: INSERTION PORT-A-CATH;  Surgeon: Joyice Faster. Cornett, MD;  Location: [facility];  Service: General;  Laterality: N/A;  . RE-EXCISION OF BREAST LUMPECTOMY Right 06/06/2014   Procedure: RE-EXCISION OF BREAST LUMPECTOMY;  Surgeon: Marcello Moores A. Cornett, MD;  Location: [facility];  Service: General;  Laterality: Right;    There were no vitals filed for this visit.  Subjective Assessment - 12/25/17 1539    Subjective  Brandy Jackson reports tightness in the Lt knee. Lt hip is hurting more than the Rt hip. Wants to decrease frequency to 1x/wk. DN really helps with the hip pain.      Currently in Pain?  Yes    Pain Score  4     Pain Location  Knee    Pain Orientation  Left    Pain Descriptors / Indicators  Aching;Dull    Pain Type  Chronic pain    Pain Onset  More than a month ago    Pain Frequency  Intermittent                       [facility] Adult PT Treatment/Exercise - 12/25/17 0001      Knee/Hip Exercises: Aerobic   Nustep  L5 x 10 min - arms at 10       Knee/Hip Exercises: Standing   Hip ADduction  AROM;Strengthening;Right;Left;2 sets;10 reps    Hip Extension  AROM;Stengthening;Right;Left;10 reps;Knee straight hand hold assist for safety     Forward Step Up  Right;Left;2 sets;10 reps;Hand Hold: 2;Step Height: 4"    Wall Squat  1 set;10 reps with ball pt reports knee pain with this exercise       Moist Heat Therapy   Number Minutes Moist Heat  20 Minutes    Moist Heat Location  Hip bilat hips; Lt hamstring       Electrical Stimulation   Electrical Stimulation Location  bilat posterior hips     Electrical Stimulation Action  IFC    Electrical Stimulation Parameters  to tolerance    Electrical Stimulation Goals  Pain;Tone      Manual Therapy   Manual Therapy  Soft tissue mobilization    Manual therapy comments  pt prone  Soft tissue mobilization  bilat posterior hips/buttocks; Lt hamstrings     Myofascial Release  posterior hip/gluts        Trigger Point Dry Needling - 12/25/17 1544    Consent Given?  Yes    Muscles Treated Lower Body  -- bilat - pt did not tolerate estim today     Gluteus Maximus Response  Palpable increased muscle length    Gluteus Minimus Response  Palpable increased muscle length    Piriformis Response  Palpable increased muscle length                PT Long Term Goals - 12/25/17 1536      PT LONG TERM GOAL #1   Title  Increase strength bilat LE's to =/> 5-/5  - - 12/18/17    Time  6    Period  Weeks    Status  On-going      PT LONG TERM GOAL #2   Title  Decrease pain Rt hip by 50-75% allowing patient to return to normal functional activities 12/18/17    Time  6    Period  Weeks    Status  On-going      PT LONG TERM GOAL #3   Title  Improve gait pattern with patient to ambulate with improved wt shift and wt bearing for functional  distances =/> 400-500 ft 12/18/17    Time  6    Period  Weeks    Status  Achieved      PT LONG TERM GOAL #4   Title  Independent in HEP 12/18/17    Time  6    Period  Weeks    Status  On-going      PT LONG TERM GOAL #5   Title  Improve FOTO to </= 38% limitation 12/18/17    Time  6    Period  Weeks    Status  On-going            Plan - 12/25/17 1537    Clinical Impression Statement  Lt knee is stiff and continues to be painful. Hip continues to improve. [facility] [place] for home. Less palpable tightness noted through bilat posterior hips. Gradually progressing with goals of treatment.     Rehab Potential  Good    PT Frequency  2x / week    PT Duration  6 weeks    PT Treatment/Interventions  Patient/family education;ADLs/Self Care Home Management;Cryotherapy;Electrical Stimulation;Iontophoresis 4mg /ml Dexamethasone;Moist Heat;Ultrasound;Dry needling;Manual techniques;Neuromuscular re-education;Therapeutic activities;Therapeutic exercise    PT Next Visit Plan  stretching and strengthening exercises Rt hip and Lt knee; DN; manual work; modalities as indicated; trial of hip abductors in sidelying with TB    Consulted and Agree with Plan of Care  Patient       Patient will benefit from skilled therapeutic intervention in order to improve the following deficits and impairments:  Postural dysfunction, Improper body mechanics, Increased muscle spasms, Increased fascial restricitons, Decreased strength, Decreased range of motion, Decreased mobility, Decreased activity tolerance, Abnormal gait  Visit Diagnosis: Pain in right hip  Acute pain of left knee  Other symptoms and signs involving the musculoskeletal system  Other abnormalities of gait and mobility     Problem List Patient Active Problem List   Diagnosis Date Noted  . Hypertrophic cardiomyopathy ([place]) 10/04/2017  . Acute on chronic diastolic CHF (congestive heart  failure) ([place]) 10/04/2017  . 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 Psoriatic  arthritis ([place]) 07/31/2015  . Vitamin D deficiency 04/13/2015  . Mood changes 03/16/2015  . 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 Bilateral lower extremity edema 09/15/2014  . Malfunction of device 08/28/2014  . Laryngitis 08/18/2014  . Type II diabetes mellitus with manifestations ([place]) 08/04/2014  . Peripheral neuropathy due to chemotherapy ([place]) 08/04/2014  . Diarrhea 08/04/2014  . Fatigue due to treatment 08/04/2014  . Rectal bleed 06/30/2014  . Post-operative state 04/24/2014  . Breast cancer of upper-outer quadrant of right female breast ([place]) 03/03/2014    Tomothy Eddins Nilda Simmer PT, MPH  12/25/2017, 4:11 PM  [facility] [place], [place], [address] Phone: [phone]   Fax:  [phone]  Name: Brandy Jackson MRN: [id number] Date of Birth: 1951-04-19

## 2017-12-30 ENCOUNTER — Ambulatory Visit (INDEPENDENT_AMBULATORY_CARE_PROVIDER_SITE_OTHER): Payer: No Typology Code available for payment source | Admitting: Physical Therapy

## 2017-12-30 DIAGNOSIS — M25551 Pain in right hip: Secondary | ICD-10-CM

## 2017-12-30 DIAGNOSIS — M25562 Pain in left knee: Secondary | ICD-10-CM | POA: Diagnosis not present

## 2017-12-30 DIAGNOSIS — R2689 Other abnormalities of gait and mobility: Secondary | ICD-10-CM | POA: Diagnosis not present

## 2017-12-30 DIAGNOSIS — R29898 Other symptoms and signs involving the musculoskeletal system: Secondary | ICD-10-CM

## 2017-12-30 NOTE — Therapy (Signed)
[place], [place], [address] Phone: [phone]   Fax:  [phone]  Physical Therapy Treatment  Patient Details  Name: Brandy Jackson MRN: [id number] Date of Birth: Jan 23, 1951 Referring Provider: Dr Darene Lamer   Encounter Date: 12/30/2017  PT End of Session - 12/30/17 1558    Visit Number  10    Number of Visits  15    Date for PT Re-Evaluation  02/10/18    PT Start Time  1600    PT Stop Time  2992    PT Time Calculation (min)  58 min    Activity Tolerance  Patient tolerated treatment well       Past Medical History:  Diagnosis Date  . Breast cancer ([place])   . Cancer ([place])   . Diabetes mellitus without complication ([facility])   . Hot flashes   . Psoriatic arthritis ([employer])   . 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 Wears glasses   . Wears partial dentures    bottom    Past Surgical History:  Procedure Laterality Date  . BLADDER REPAIR W/ CESAREAN SECTION    . COLONOSCOPY    . PORTACATH PLACEMENT N/A 04/11/2014   Procedure: INSERTION PORT-A-CATH;  Surgeon: Joyice Faster. Cornett, MD;  Location: [facility];  Service: General;  Laterality: N/A;  . RE-EXCISION OF BREAST LUMPECTOMY Right 06/06/2014   Procedure: RE-EXCISION OF BREAST LUMPECTOMY;  Surgeon: Marcello Moores A. Cornett, MD;  Location: [facility];  Service: General;  Laterality: Right;    There were no vitals filed for this visit.  Subjective Assessment - 12/30/17 1601    Subjective  No reports that her Lt knee feels a little better today, walked 1/2 today.     Currently in Pain?  Yes    Pain Score  3     Pain Location  Knee    Pain Orientation  Left    Pain Descriptors / Indicators  Tightness    Pain Type  Chronic pain    Pain Onset  More than a month ago    Pain Frequency  Intermittent    Aggravating Factors   weather    Pain Relieving Factors  rest and gentle motion         [facility] PT  Assessment - 12/30/17 0001      Assessment   Medical Diagnosis  LBP Rt hip pain     Referring Provider  Dr T    Onset Date/Surgical Date  09/21/17      Observation/Other Assessments   Focus on Therapeutic Outcomes (FOTO)   43% limited      AROM   Right/Left Knee  -- Rt 0-116    Left Knee Extension  -6    Left Knee Flexion  100      Strength   Right/Left Hip  Left;Right    Right Hip Flexion  -- 5-/5    Right Hip Extension  4+/5    Right Hip ABduction  -- 5-/5    Left Hip Flexion  5/5    Left Hip Extension  4-/5    Left Hip ABduction  4/5    Left Knee Flexion  4+/5    Left Knee Extension  5/5      Flexibility   Quadriceps  prone knee flex Lt 93, Rt 109                  [facility] Adult PT Treatment/Exercise -  12/30/17 0001      Knee/Hip Exercises: Stretches   Sports administrator  Both;30 seconds;3 reps      Knee/Hip Exercises: Supine   Heel Slides  AAROM;Left;10 reps with self mob to stretch knee    Bridges  15 reps    Other Supine Knee/Hip Exercises  bilat clams with green band x 15 reps      Knee/Hip Exercises: Prone   Hip Extension  Strengthening;Right;Left;2 sets;10 reps      Modalities   Modalities  Electrical Stimulation;Moist Heat      Moist Heat Therapy   Number Minutes Moist Heat  20 Minutes    Moist Heat Location  Hip Lt and lateral thigh      Electrical Stimulation   Electrical Stimulation Location  lumbar and Lt lateral/posterior thigh    Electrical Stimulation Action  IFC    Electrical Stimulation Parameters  to tolerance    Electrical Stimulation Goals  Pain;Tone                  PT Long Term Goals - 12/30/17 1607      PT LONG TERM GOAL #1   Title  Increase strength bilat LE's to =/> 5-/5  ( 3/6/ 19)     Period  Weeks    Status  Partially Met      PT LONG TERM GOAL #2   Title  Decrease pain Rt hip by 50-75% allowing patient to return to normal functional activities 12/18/17    Status  Achieved 80% improved      PT LONG TERM  GOAL #3   Title  Improve gait pattern with patient to ambulate with improved wt shift and wt bearing for functional distances =/> 400-500 ft 12/18/17    Status  Achieved      PT LONG TERM GOAL #4   Title  Independent in HEP     Period  Weeks    Status  On-going      PT LONG TERM GOAL #5   Title  Improve FOTO to </= 38% limitation 02/10/18    Time  6    Period  Weeks    Status  On-going 43% limited today      PT LONG TERM GOAL #6   Title  report =/> 75% reduction in pain in the Lt knee to allow her to mile again ( 02/10/18)     Time  6    Period  Weeks    Status  New            Plan - 12/30/17 1619    Clinical Impression Statement  Lorrie has partially met her goals.  She is making slow progress.  She has almost eliminated her Rt hip pain, will still have some LT hip/groin/knee pain and intermittent low back pain.  She has improved LE strenth however still weak in some areas and her core. Sanskriti would benetif from continued treatment to further improve knee motion, strengthen her leg and core.  Will reduce frequency to once a week.     Rehab Potential  Good    PT Frequency  1x / week    PT Duration  6 weeks    PT Treatment/Interventions  Patient/family education;ADLs/Self Care Home Management;Cryotherapy;Electrical Stimulation;Iontophoresis 81m/ml Dexamethasone;Moist Heat;Ultrasound;Dry needling;Manual techniques;Neuromuscular re-education;Therapeutic activities;Therapeutic exercise    PT Next Visit Plan  stretching and strengthening exercises Rt hip and Lt knee; DN; manual work; modalities as indicated;    Consulted and Agree with Plan of Care  Patient       Patient will benefit from skilled therapeutic intervention in order to improve the following deficits and impairments:  Postural dysfunction, Improper body mechanics, Increased muscle spasms, Increased fascial restricitons, Decreased strength, Decreased range of motion, Decreased mobility, Decreased activity tolerance,  Abnormal gait  Visit Diagnosis: Pain in right hip - Plan: PT plan of care cert/re-cert  Acute pain of left knee - Plan: PT plan of care cert/re-cert  Other symptoms and signs involving the musculoskeletal system - Plan: PT plan of care cert/re-cert  Other abnormalities of gait and mobility - Plan: PT plan of care cert/re-cert     Problem List Patient Active Problem List   Diagnosis Date Noted  . Hypertrophic cardiomyopathy ([place]) 10/04/2017  . Acute on chronic diastolic CHF (congestive heart failure) ([place]) 10/04/2017  . 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 Psoriatic arthritis ([place]) 07/31/2015  . Vitamin D deficiency 04/13/2015  . Mood changes 03/16/2015  . 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 Bilateral lower extremity edema 09/15/2014  . Malfunction of device 08/28/2014  . Laryngitis 08/18/2014  . Type II diabetes mellitus with manifestations ([place]) 08/04/2014  . Peripheral neuropathy due to chemotherapy ([place]) 08/04/2014  . Diarrhea 08/04/2014  . Fatigue due to treatment 08/04/2014  . Rectal bleed 06/30/2014  . Post-operative state 04/24/2014  . Breast cancer of upper-outer quadrant of right female breast ([place]) 03/03/2014    Jeral Pinch PT  12/30/2017, 4:41 PM  [facility] [place], [place], [address] Phone: [phone]   Fax:  [phone]  Name: WADIE MATTIE MRN: [id number] Date of Birth: [birth year]

## 2017-12-31 ENCOUNTER — Encounter: Payer: No Typology Code available for payment source | Admitting: Rehabilitative and Restorative Service Providers"

## 2018-01-08 ENCOUNTER — Encounter: Payer: Self-pay | Admitting: Physical Therapy

## 2018-01-08 ENCOUNTER — Ambulatory Visit (INDEPENDENT_AMBULATORY_CARE_PROVIDER_SITE_OTHER): Payer: No Typology Code available for payment source | Admitting: Physical Therapy

## 2018-01-08 DIAGNOSIS — R29898 Other symptoms and signs involving the musculoskeletal system: Secondary | ICD-10-CM | POA: Diagnosis not present

## 2018-01-08 DIAGNOSIS — R2689 Other abnormalities of gait and mobility: Secondary | ICD-10-CM

## 2018-01-08 DIAGNOSIS — M25562 Pain in left knee: Secondary | ICD-10-CM

## 2018-01-08 DIAGNOSIS — M25551 Pain in right hip: Secondary | ICD-10-CM | POA: Diagnosis not present

## 2018-01-08 NOTE — Therapy (Signed)
[place], [place], [address] Phone: [phone]   Fax:  [phone]  Physical Therapy Treatment  Patient Details  Name: Brandy Jackson MRN: [id number] Date of Birth: March 29, 1951 Referring Provider: Dr Darene Lamer   Encounter Date: 01/08/2018  PT End of Session - 01/08/18 0843    Visit Number  11    Number of Visits  15    Date for PT Re-Evaluation  02/10/18    PT Start Time  2409    PT Stop Time  0943    PT Time Calculation (min)  59 min    Activity Tolerance  Patient tolerated treatment well       Past Medical History:  Diagnosis Date  . Breast cancer ([place])   . Cancer ([place])   . Diabetes mellitus without complication ([place])   . Hot flashes   . Psoriatic arthritis ([place])   . 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 Wears glasses   . Wears partial dentures    bottom    Past Surgical History:  Procedure Laterality Date  . BLADDER REPAIR W/ CESAREAN SECTION    . COLONOSCOPY    . PORTACATH PLACEMENT N/A 04/11/2014   Procedure: INSERTION PORT-A-CATH;  Surgeon: Joyice Faster. Cornett, MD;  Location: [facility];  Service: General;  Laterality: N/A;  . RE-EXCISION OF BREAST LUMPECTOMY Right 06/06/2014   Procedure: RE-EXCISION OF BREAST LUMPECTOMY;  Surgeon: Marcello Moores A. Cornett, MD;  Location: [facility];  Service: General;  Laterality: Right;    There were no vitals filed for this visit.  Subjective Assessment - 01/08/18 0846    Subjective  Pt reports she thinks she overdid her exercise because her back is feeling  a little sore today.     Patient Stated Goals  to be pain free     Currently in Pain?  Yes    Pain Score  5     Pain Location  Back    Pain Orientation  Lower    Pain Descriptors / Indicators  Aching    Pain Type  Chronic pain    Pain Onset  More than a month ago    Pain Frequency  Intermittent    Aggravating Factors   overdoing it at the gym    Pain Relieving Factors  gentle motion    Effect of Pain on Daily Activities  Lt knee pain 7/10, sharp at times in the outside of the knee, worse with walking and better with rest.          [facility] PT Assessment - 01/08/18 0001      Assessment   Medical Diagnosis  LBP Rt hip pain       Strength   Left Knee Flexion  -- 5-/5    Left Knee Extension  5/5                  [facility] Adult PT Treatment/Exercise - 01/08/18 0001      Ambulation/Gait   Gait Comments  line drills keeping each foot straight on the line, length of gym 10 reps very difficult for pt, partially d/t LE valgus      Knee/Hip Exercises: Stretches   Passive Hamstring Stretch  Both;60 seconds with strap supine    Quad Stretch  Both;1 rep;60 seconds prone with strap    Other Knee/Hip Stretches  single knee to chest with strap      Knee/Hip Exercises: Aerobic  Nustep  L4 x 10 min - arms at 10  therapist present to discuss symptom changes      Knee/Hip Exercises: Seated   Other Seated Knee/Hip Exercises  seated on blue disc, slight BWD lean, 3x10 FWD press holding 3#, then alt arm/leg lifts    Hamstring Curl  Strengthening;Both;3 sets;10 reps blue band      Modalities   Modalities  Electrical Stimulation;Moist Heat      Moist Heat Therapy   Number Minutes Moist Heat  20 Minutes    Moist Heat Location  Lumbar Spine;Knee Lt knee      Electrical Stimulation   Electrical Stimulation Location  lumbar    Electrical Stimulation Action  IFC    Electrical Stimulation Parameters  to tolerance    Electrical Stimulation Goals  Pain                  PT Long Term Goals - 01/08/18 0848      PT LONG TERM GOAL #1   Title  Increase strength bilat LE's to =/> 5-/5  ( 3/6/ 19)     Status  Partially Met      PT LONG TERM GOAL #2   Title  Decrease pain Rt hip by 50-75% allowing patient to return to normal functional activities 12/18/17    Status  Achieved      PT LONG TERM GOAL #3   Title  Improve gait  pattern with patient to ambulate with improved wt shift and wt bearing for functional distances =/> 400-500 ft 12/18/17    Status  Achieved      PT LONG TERM GOAL #4   Title  Independent in HEP     Status  On-going      PT LONG TERM GOAL #5   Title  Improve FOTO to </= 38% limitation 02/10/18    Status  On-going      PT LONG TERM GOAL #6   Title  report =/> 75% reduction in pain in the Lt knee to allow her to mile again ( 02/10/18)     Status  On-going            Plan - 01/08/18 0917    Clinical Impression Statement  Brandy Jackson reports some increase in soreness in her low back, she thinks she did to much exercise this past week.  Her LE's are getting stronger.  Making progress to her goals, would benefit from continued tx to improve core strenght and bilat hips/knees.     Rehab Potential  Good    PT Frequency  1x / week    PT Duration  6 weeks    PT Treatment/Interventions  Patient/family education;ADLs/Self Care Home Management;Cryotherapy;Electrical Stimulation;Iontophoresis 77m/ml Dexamethasone;Moist Heat;Ultrasound;Dry needling;Manual techniques;Neuromuscular re-education;Therapeutic activities;Therapeutic exercise    PT Next Visit Plan  stretching and strengthening exercises Rt hip and Lt knee; manual work; modalities as indicated;    Consulted and Agree with Plan of Care  Patient       Patient will benefit from skilled therapeutic intervention in order to improve the following deficits and impairments:  Postural dysfunction, Improper body mechanics, Increased muscle spasms, Increased fascial restricitons, Decreased strength, Decreased range of motion, Decreased mobility, Decreased activity tolerance, Abnormal gait  Visit Diagnosis: Pain in right hip  Acute pain of left knee  Other symptoms and signs involving the musculoskeletal system  Other abnormalities of gait and mobility     Problem List Patient Active Problem List   Diagnosis Date Noted  .  Hypertrophic  cardiomyopathy ([place]) 10/04/2017  . Acute on chronic diastolic CHF (congestive heart failure) ([place]) 10/04/2017  . 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 Psoriatic arthritis ([place]) 07/31/2015  . Vitamin D deficiency 04/13/2015  . Mood changes 03/16/2015  . 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 Bilateral lower extremity edema 09/15/2014  . Malfunction of device 08/28/2014  . Laryngitis 08/18/2014  . Type II diabetes mellitus with manifestations ([place]) 08/04/2014  . Peripheral neuropathy due to chemotherapy ([place]) 08/04/2014  . Diarrhea 08/04/2014  . Fatigue due to treatment 08/04/2014  . Rectal bleed 06/30/2014  . Post-operative state 04/24/2014  . Breast cancer of upper-outer quadrant of right female breast ([place]) 03/03/2014    Boneta Lucks rPT  01/08/2018, 9:28 AM  [facility] [place], [place], [address] Phone: [phone]   Fax:  [phone]  Name: Brandy Jackson MRN: [id number] Date of Birth: 1951/09/26

## 2018-01-13 ENCOUNTER — Ambulatory Visit (INDEPENDENT_AMBULATORY_CARE_PROVIDER_SITE_OTHER): Payer: No Typology Code available for payment source | Admitting: Rehabilitative and Restorative Service Providers"

## 2018-01-13 ENCOUNTER — Encounter: Payer: Self-pay | Admitting: Rehabilitative and Restorative Service Providers"

## 2018-01-13 DIAGNOSIS — M25551 Pain in right hip: Secondary | ICD-10-CM | POA: Diagnosis not present

## 2018-01-13 DIAGNOSIS — R29898 Other symptoms and signs involving the musculoskeletal system: Secondary | ICD-10-CM

## 2018-01-13 DIAGNOSIS — R2689 Other abnormalities of gait and mobility: Secondary | ICD-10-CM

## 2018-01-13 DIAGNOSIS — M25562 Pain in left knee: Secondary | ICD-10-CM

## 2018-01-13 NOTE — Therapy (Signed)
[place], [place], [address] Phone: [phone]   Fax:  [phone]  Physical Therapy Treatment  Patient Details  Name: Brandy Jackson MRN: [id number] Date of Birth: 02/23/1951 Referring Provider: Dr Darene Lamer   Encounter Date: 01/13/2018  PT End of Session - 01/13/18 1509    Visit Number  12    Number of Visits  15    Date for PT Re-Evaluation  02/10/18    PT Start Time  9233    PT Stop Time  1605    PT Time Calculation (min)  58 min    Activity Tolerance  Patient tolerated treatment well       Past Medical History:  Diagnosis Date  . Breast cancer ([place])   . Cancer ([place])   . Diabetes mellitus without complication ([place])   . Hot flashes   . Psoriatic arthritis ([place])   . 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 Wears glasses   . Wears partial dentures    bottom    Past Surgical History:  Procedure Laterality Date  . BLADDER REPAIR W/ CESAREAN SECTION    . COLONOSCOPY    . PORTACATH PLACEMENT N/A 04/11/2014   Procedure: INSERTION PORT-A-CATH;  Surgeon: Joyice Faster. Cornett, MD;  Location: [facility];  Service: General;  Laterality: N/A;  . RE-EXCISION OF BREAST LUMPECTOMY Right 06/06/2014   Procedure: RE-EXCISION OF BREAST LUMPECTOMY;  Surgeon: Marcello Moores A. Cornett, MD;  Location: [facility];  Service: General;  Laterality: Right;    There were no vitals filed for this visit.  Subjective Assessment - 01/13/18 1512    Subjective  Brandy Jackson reports that she got a Nustep for home. She is excited to have this exercise equipment for home.  She has stiffness in the morning but the Nustep helps her loosen up. She has pain in the Lt knee but no hip pain.     Currently in Pain?  Yes    Pain Score  5     Pain Location  Knee    Pain Orientation  Left    Pain Descriptors / Indicators  Tightness;Aching    Pain Type  Chronic pain    Pain Onset  More than a month  ago    Pain Frequency  Intermittent    Aggravating Factors   first thing in the morning; after she sits for a while and gets up; overdoing it in the gym; walking      Pain Relieving Factors  Nu-step; rest      Effect of Pain on Daily Activities  Lt knee pain sharp at times up to 7/10 in the outside of the knee                       [facility] Adult PT Treatment/Exercise - 01/13/18 0001      Knee/Hip Exercises: Stretches   Quad Stretch  Right;Left;3 reps;30 seconds    Hip Flexor Stretch  Right;Left;3 reps;20 seconds PT assist pt prone knee flexed hip extended       Knee/Hip Exercises: Aerobic   Nustep  L5 x 10 min - arms at 10  therapist present to discuss symptom changes/HEP       Knee/Hip Exercises: Supine   Bridges  Strengthening;Both;10 reps    Other Supine Knee/Hip Exercises  clams holding one LE still moving opposite 2 sets of 10 then bilat clams with green band x 15 reps  Knee/Hip Exercises: Sidelying   Hip ABduction  AROM;Strengthening;Right;Left;10 reps    Clams  green TB x 10 reps each side       Moist Heat Therapy   Number Minutes Moist Heat  20 Minutes    Moist Heat Location  Lumbar Spine;Knee Lt knee      Cryotherapy   Number Minutes Cryotherapy  15 Minutes    Cryotherapy Location  Knee anterior knee pt prone     Type of Cryotherapy  Ice pack      Electrical Stimulation   Electrical Stimulation Location  Lt posterior lateral hip x2; Lt lateral/medial knee x 2      Electrical Stimulation Action  HVGS     Electrical Stimulation Parameters  to tolerance    Electrical Stimulation Goals  Pain;Tone      Manual Therapy   Manual Therapy  Soft tissue mobilization    Manual therapy comments  patient Rt sidelying     Soft tissue mobilization  bilat posterior hips/buttocks; Lt hamstrings     Myofascial Release  lateral hip and thigh        Trigger Point Dry Needling - 01/13/18 1529    Consent Given?  Yes    Muscles Treated Lower Body  -- ITB/TFL with  estim - ITB proximal Lt thigh     Tensor Fascia Lata Response  Palpable increased muscle length                PT Long Term Goals - 01/13/18 1510      PT LONG TERM GOAL #1   Title  Increase strength bilat LE's to =/> 5-/5  ( 3/6/ 19)     Time  6    Period  Weeks    Status  Partially Met      PT LONG TERM GOAL #2   Title  Decrease pain Rt hip by 50-75% allowing patient to return to normal functional activities 12/18/17    Time  6    Period  Weeks    Status  Achieved      PT LONG TERM GOAL #3   Title  Improve gait pattern with patient to ambulate with improved wt shift and wt bearing for functional distances =/> 400-500 ft 12/18/17    Time  6    Period  Weeks    Status  Achieved      PT LONG TERM GOAL #4   Title  Independent in HEP     Time  6    Period  Weeks    Status  On-going      PT LONG TERM GOAL #5   Title  Improve FOTO to </= 38% limitation 02/10/18    Time  6    Period  Weeks    Status  On-going      PT LONG TERM GOAL #6   Title  report =/> 75% reduction in pain in the Lt knee to allow her to walk a mile again ( 02/10/18)     Time  6    Period  Weeks    Status  On-going            Plan - 01/13/18 1601    Clinical Impression Statement  Brandy Jackson has Nu-step for home and is using this daily. Discussed the importance of continuing her other exercises for home to balance her muscles and strengthen the outside of her hips(abductors). She responded well to DN through lateral hip/thigh with good release of muscle tightness  following DN and manual work. Valgus knee deformity creates problems mechanically for hips and knees. Will continue to work to increase core and hip strength.     Rehab Potential  Good    PT Frequency  1x / week    PT Duration  6 weeks    PT Treatment/Interventions  Patient/family education;ADLs/Self Care Home Management;Cryotherapy;Electrical Stimulation;Iontophoresis 19m/ml Dexamethasone;Moist Heat;Ultrasound;Dry needling;Manual  techniques;Neuromuscular re-education;Therapeutic activities;Therapeutic exercise    PT Next Visit Plan  stretching and strengthening exercises Rt hip and Lt knee; DN, manual work, modalities as indicated;    Consulted and Agree with Plan of Care  Patient       Patient will benefit from skilled therapeutic intervention in order to improve the following deficits and impairments:  Postural dysfunction, Improper body mechanics, Increased muscle spasms, Increased fascial restricitons, Decreased strength, Decreased range of motion, Decreased mobility, Decreased activity tolerance, Abnormal gait  Visit Diagnosis: Pain in right hip  Acute pain of left knee  Other symptoms and signs involving the musculoskeletal system  Other abnormalities of gait and mobility     Problem List Patient Active Problem List   Diagnosis Date Noted  . Hypertrophic cardiomyopathy ([place] 10/04/2017  . Acute on chronic diastolic CHF (congestive heart failure) ([place] 10/04/2017  . 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 Psoriatic arthritis ([place] 07/31/2015  . Vitamin D deficiency 04/13/2015  . Mood changes 03/16/2015  . 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 Bilateral lower extremity edema 09/15/2014  . Malfunction of device 08/28/2014  . Laryngitis 08/18/2014  . Type II diabetes mellitus with manifestations ([place] 08/04/2014  . Peripheral neuropathy due to chemotherapy ([place] 08/04/2014  . Diarrhea 08/04/2014  . Fatigue due to treatment 08/04/2014  . Rectal bleed 06/30/2014  . Post-operative state 04/24/2014  . Breast cancer of upper-outer quadrant of right female breast ([place] 03/03/2014    Brandy Jackson PNilda SimmerPT, MPH  01/13/2018, 4:06 PM  [facility] [address]Phone: [phone]  Fax:  [phone] Name: Brandy LOOMANMRN: [id number] of Birth: 604/21/1952

## 2018-01-22 ENCOUNTER — Encounter: Payer: Self-pay | Admitting: Rehabilitative and Restorative Service Providers"

## 2018-01-22 ENCOUNTER — Ambulatory Visit (INDEPENDENT_AMBULATORY_CARE_PROVIDER_SITE_OTHER): Payer: No Typology Code available for payment source | Admitting: Rehabilitative and Restorative Service Providers"

## 2018-01-22 DIAGNOSIS — M25562 Pain in left knee: Secondary | ICD-10-CM | POA: Diagnosis not present

## 2018-01-22 DIAGNOSIS — R29898 Other symptoms and signs involving the musculoskeletal system: Secondary | ICD-10-CM | POA: Diagnosis not present

## 2018-01-22 DIAGNOSIS — R2689 Other abnormalities of gait and mobility: Secondary | ICD-10-CM

## 2018-01-22 DIAGNOSIS — M25551 Pain in right hip: Secondary | ICD-10-CM | POA: Diagnosis not present

## 2018-01-22 NOTE — Therapy (Signed)
[place], [place], [address] Phone: [phone]   Fax:  [phone]  Physical Therapy Treatment  Patient Details  Name: Brandy Jackson MRN: [id number] Date of Birth: 29-Nov-1951 Referring Provider: Dr Dianah Field    Encounter Date: 01/22/2018  PT End of Session - 01/22/18 1223    Visit Number  13    Number of Visits  15    Date for PT Re-Evaluation  02/10/18    PT Start Time  1146    PT Stop Time  1245    PT Time Calculation (min)  59 min    Activity Tolerance  Patient tolerated treatment well       Past Medical History:  Diagnosis Date  . Breast cancer ([place])   . Cancer ([place])   . Diabetes mellitus without complication ([place])   . Hot flashes   . Psoriatic arthritis ([place])   . 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 Wears glasses   . Wears partial dentures    bottom    Past Surgical History:  Procedure Laterality Date  . BLADDER REPAIR W/ CESAREAN SECTION    . COLONOSCOPY    . PORTACATH PLACEMENT N/A 04/11/2014   Procedure: INSERTION PORT-A-CATH;  Surgeon: Joyice Faster. Cornett, MD;  Location: [facility];  Service: General;  Laterality: N/A;  . RE-EXCISION OF BREAST LUMPECTOMY Right 06/06/2014   Procedure: RE-EXCISION OF BREAST LUMPECTOMY;  Surgeon: Marcello Moores A. Cornett, MD;  Location: [facility];  Service: General;  Laterality: Right;    There were no vitals filed for this visit.  Subjective Assessment - 01/22/18 1158    Subjective  using nustep at home daily. That is helping with her pain and stiffness. On the Nu-step every morning about 20 min to get limbered up. She has some pain in the Lt knee today but not too bad.     Currently in Pain?  Yes    Pain Score  3     Pain Location  Knee    Pain Orientation  Left    Pain Descriptors / Indicators  Aching;Tightness    Pain Type  Chronic pain         [facility] PT Assessment - 01/22/18 0001       Assessment   Medical Diagnosis  LBP Rt hip pain     Referring Provider  Dr Dianah Field     Onset Date/Surgical Date  09/21/17    Hand Dominance  Right    Next MD Visit  PRN     Prior Therapy  [facility] visit       Strength   Left Hip Extension  4/5    Left Hip ABduction  4/5    Left Knee Flexion  -- 5-/5    Left Knee Extension  5/5      Flexibility   Quadriceps  prone knee flex Lt 95, Rt 110      Palpation   Palpation comment  tight Rt> Lt posterior hip musculature including piriformis, glut med/max; Lt hamstrings                   [facility] Adult PT Treatment/Exercise - 01/22/18 0001      Knee/Hip Exercises: Aerobic   Nustep  L5 x 5 min - arms at 10  therapist present to discuss symptom changes/HEP       Knee/Hip Exercises: Standing   Hip Abduction  AROM;Stengthening;Right;Left;2 sets;10 reps  Hip Extension  AROM;Stengthening;Right;Left;10 reps;Knee straight      Knee/Hip Exercises: Seated   Long Arc Quad  AAROM;Left;Right;2 sets;10 reps    Clamshell with TheraBand  Green 10 reps 2 sets     Sit to Sand  10 reps;without UE support      Knee/Hip Exercises: Supine   Bridges  Strengthening;Both;10 reps    Bridges with Clamshell  Strengthening;Both;10 reps    Other Supine Knee/Hip Exercises  clams holding one LE still moving opposite 2 sets of 10 then bilat clams with green band x 15 reps      Shoulder Exercises: Standing   Extension  Strengthening;Both;20 reps;Theraband    Theraband Level (Shoulder Extension)  Level 3 (Green)    Row  Strengthening;Both;20 reps;Theraband    Theraband Level (Shoulder Row)  Level 3 (Green)    Retraction  Strengthening;Both;20 reps;Theraband    Theraband Level (Shoulder Retraction)  Level 3 (Green)    Other Standing Exercises  SASH diagonals sitting with green TB       Moist Heat Therapy   Number Minutes Moist Heat  15 Minutes    Moist Heat Location  -- Lt posterior lateral thigh; Rt foot       Electrical Stimulation    Electrical Stimulation Location  Lt posterior lateral hip x2; Lt lateral/medial knee x 2      Electrical Stimulation Action  IFC    Electrical Stimulation Parameters  to tolerance    Electrical Stimulation Goals  Pain;Tone      Manual Therapy   Manual Therapy  Soft tissue mobilization    Manual therapy comments  pt prone     Soft tissue mobilization  Lt posterior lateral thigh     Myofascial Release  hamstrings/ITB       Trigger Point Dry Needling - 01/22/18 1239    Consent Given?  Yes    Muscles Treated Lower Body  -- Lt LE with estim     Tensor Fascia Lata Response  Palpable increased muscle length    Hamstring Response  Palpable increased muscle length           PT Education - 01/22/18 1220    Education provided  Yes    Education Details  HEP     Person(s) Educated  Patient    Methods  Explanation;Demonstration;Tactile cues;Verbal cues;Handout    Comprehension  Verbalized understanding;Returned demonstration;Verbal cues required;Tactile cues required          PT Long Term Goals - 01/22/18 1245      PT LONG TERM GOAL #1   Title  Increase strength bilat LE's to =/> 5-/5  ( 3/6/ 19)     Time  6    Period  Weeks    Status  Partially Met      PT LONG TERM GOAL #2   Title  Decrease pain Rt hip by 50-75% allowing patient to return to normal functional activities 12/18/17    Time  6    Period  Weeks    Status  Achieved      PT LONG TERM GOAL #3   Title  Improve gait pattern with patient to ambulate with improved wt shift and wt bearing for functional distances =/> 400-500 ft 12/18/17    Time  6    Period  Weeks    Status  Achieved      PT LONG TERM GOAL #4   Title  Independent in HEP     Time  6  Period  Weeks    Status  On-going      PT LONG TERM GOAL #5   Title  Improve FOTO to </= 38% limitation 02/10/18    Time  6    Period  Weeks    Status  On-going      PT LONG TERM GOAL #6   Title  report =/> 75% reduction in pain in the Lt knee to allow her to  walk a mile again ( 02/10/18)     Time  6    Period  Weeks    Status  Partially Met            Plan - 01/22/18 1242    Clinical Impression Statement  continued progress with decreased pain Lt knee. She continues to have some stiffness in the Lt knee especially in the mornings. Brandy Jackson feels the DN helps with muscular tightness. DN to Lt hamstring and ITB mid to proximal thigh with good soft tissue release. Pt is demonstrating increased exercise tolerance working on core stabilization activities. Progressing toward stated goals of therapy.     Rehab Potential  Good    PT Frequency  1x / week    PT Duration  6 weeks    PT Treatment/Interventions  Patient/family education;ADLs/Self Care Home Management;Cryotherapy;Electrical Stimulation;Iontophoresis 8m/ml Dexamethasone;Moist Heat;Ultrasound;Dry needling;Manual techniques;Neuromuscular re-education;Therapeutic activities;Therapeutic exercise    PT Next Visit Plan  stretching and strengthening exercises Rt hip and Lt knee; DN, manual work, modalities as indicated;    Consulted and Agree with Plan of Care  Patient       Patient will benefit from skilled therapeutic intervention in order to improve the following deficits and impairments:  Postural dysfunction, Improper body mechanics, Increased muscle spasms, Increased fascial restricitons, Decreased strength, Decreased range of motion, Decreased mobility, Decreased activity tolerance, Abnormal gait  Visit Diagnosis: Pain in right hip  Acute pain of left knee  Other symptoms and signs involving the musculoskeletal system  Other abnormalities of gait and mobility     Problem List Patient Active Problem List   Diagnosis Date Noted  . Hypertrophic cardiomyopathy ([place] 10/04/2017  . Acute on chronic diastolic CHF (congestive heart failure) ([facility] 10/04/2017  . 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 Psoriatic arthritis ([facility] 07/31/2015  . Vitamin D deficiency 04/13/2015  . Mood changes  03/16/2015  . 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 Bilateral lower extremity edema 09/15/2014  . Malfunction of device 08/28/2014  . Laryngitis 08/18/2014  . Type II diabetes mellitus with manifestations ([facility] 08/04/2014  . Peripheral neuropathy due to chemotherapy ([place] 08/04/2014  . Diarrhea 08/04/2014  . Fatigue due to treatment 08/04/2014  . Rectal bleed 06/30/2014  . Post-operative state 04/24/2014  . Breast cancer of upper-outer quadrant of right female breast ([facility] 03/03/2014    Brandy Jackson PNilda SimmerPT, MPH  01/22/2018, 12:46 PM  [facility] [address]Phone: [phone]  Fax:  [phone] Name: Brandy MANNINAMRN: [id number] of Birth: 6December 09, 1952

## 2018-01-22 NOTE — Patient Instructions (Addendum)
Tighten core with all exercises - hold belly button to back bone  And keep breathing    Resisted External Rotation: in Neutral - Bilateral   PALMS UP Sit or stand, tubing in both hands, elbows at sides, bent to 90, forearms forward. Pinch shoulder blades together and rotate forearms out. Keep elbows at sides. Repeat __10__ times per set. Do _2-3___ sets per session. Do _1__ sessions per day.   Low Row: Standing   Face anchor, feet shoulder width apart. Palms up, pull arms back, squeezing shoulder blades together. Repeat 10__ times per set. Do 2-3__ sets per session. Do 1__ sessions per day   Strengthening: Resisted Extension   Hold tubing in right hand, arm forward. Pull arm back, elbow straight. Repeat _10___ times per set. Do 2-3____ sets per session. Do 1____ sessions per day.  Sash   Can do in sitting instead of lying on your back  On back, knees bent, feet flat, left hand on left hip, right hand above left. Pull right arm DIAGONALLY (hip to shoulder) across chest. Bring right arm along head toward floor. Hold momentarily. Slowly return to starting position. Repeat _10__ times. Do with left arm. Band color _green _____   Strengthening: Hip Abduction - Resisted    extend leg out from side leading with your heel. Repeat __10__ times per set. Do __2-3__ sets per session. Do __1__ sessions per day.    Strengthening: Hip Extension - Resisted    Pull leg straight back. Repeat _10___ times per set. Do ___2-3_ sets per session. Do __1__ sessions per day.

## 2018-02-05 ENCOUNTER — Ambulatory Visit (INDEPENDENT_AMBULATORY_CARE_PROVIDER_SITE_OTHER): Payer: Non-veteran care | Admitting: Rehabilitative and Restorative Service Providers"

## 2018-02-05 ENCOUNTER — Encounter: Payer: Self-pay | Admitting: Rehabilitative and Restorative Service Providers"

## 2018-02-05 DIAGNOSIS — R29898 Other symptoms and signs involving the musculoskeletal system: Secondary | ICD-10-CM

## 2018-02-05 DIAGNOSIS — R2689 Other abnormalities of gait and mobility: Secondary | ICD-10-CM

## 2018-02-05 DIAGNOSIS — M25562 Pain in left knee: Secondary | ICD-10-CM | POA: Diagnosis not present

## 2018-02-05 DIAGNOSIS — M25551 Pain in right hip: Secondary | ICD-10-CM

## 2018-02-05 NOTE — Therapy (Signed)
[facility] [place], [place], [address] Phone: [phone]   Fax:  [phone]  Physical Therapy Treatment  Patient Details  Name: Brandy Jackson MRN: [id number] Date of Birth: 1951/01/07 Referring Provider: Dr Dianah Field    Encounter Date: 02/05/2018  PT End of Session - 02/05/18 1025    Visit Number  14    Number of Visits  15    Date for PT Re-Evaluation  02/10/18    PT Start Time  1016    PT Stop Time  1114    PT Time Calculation (min)  58 min    Activity Tolerance  Patient tolerated treatment well       Past Medical History:  Diagnosis Date  . Breast cancer ([place])   . Cancer ([place])   . Diabetes mellitus without complication ([place])   . Hot flashes   . Psoriatic arthritis ([place])   . 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 Wears glasses   . Wears partial dentures    bottom    Past Surgical History:  Procedure Laterality Date  . BLADDER REPAIR W/ CESAREAN SECTION    . COLONOSCOPY    . PORTACATH PLACEMENT N/A 04/11/2014   Procedure: INSERTION PORT-A-CATH;  Surgeon: Joyice Faster. Cornett, MD;  Location: [facility];  Service: General;  Laterality: N/A;  . RE-EXCISION OF BREAST LUMPECTOMY Right 06/06/2014   Procedure: RE-EXCISION OF BREAST LUMPECTOMY;  Surgeon: Marcello Moores A. Cornett, MD;  Location: [facility];  Service: General;  Laterality: Right;    There were no vitals filed for this visit.  Subjective Assessment - 02/05/18 1028    Subjective  still exercising with stepper at home every day and has returned to walking one mile each day. Hip and back are fine. Still has some discomfort in the Lt knee with some movements.     Pertinent History  arthritis     Currently in Pain?  Yes    Pain Score  3     Pain Location  Knee    Pain Orientation  Left    Pain Descriptors / Indicators  Aching;Tightness    Pain Type  Chronic pain    Pain Onset  More than a  month ago    Pain Frequency  Intermittent    Aggravating Factors   first thing in the morning; after ditting and then when she stands back up; overdoing it in the gym or with walking     Pain Relieving Factors  Nu-step; rest          [facility] PT Assessment - 02/05/18 0001      Assessment   Medical Diagnosis  LBP Rt hip pain     Referring Provider  Dr Dianah Field     Onset Date/Surgical Date  09/21/17    Hand Dominance  Right    Next MD Visit  PRN     [facility]       Observation/Other Assessments   Focus on Therapeutic Outcomes (FOTO)   33% limitation       Strength   Right Hip Flexion  5/5    Right Hip Extension  -- 5-/5     Right Hip ABduction  5/5    Left Hip Flexion  5/5    Left Hip Extension  5/5    Left Hip ABduction  5/5    Right Knee Flexion  5/5    Right  Knee Extension  5/5    Left Knee Flexion  5/5    Left Knee Extension  5/5      Flexibility   Hamstrings  tight ~ 75-80 deg bilat     Quadriceps  prone knee flex Lt 95, Rt 110    ITB  WFL    Piriformis  WFL       Palpation   Palpation comment  min to mod persistewnt tightness Rt> Lt posterior hip musculature including piriformis, glut med/max; Lt hamstrings                   [facility] Adult PT Treatment/Exercise - 02/05/18 0001      Knee/Hip Exercises: Stretches   Quad Stretch  Right;Left;3 reps;30 seconds    Hip Flexor Stretch  Right;Left;3 reps;20 seconds PT assist for hip flexor stretch       Knee/Hip Exercises: Aerobic   Nustep  L6 x 8 min - arms at 10  therapist present to discuss symptom changes/HEP       Knee/Hip Exercises: Standing   Hip Abduction  AROM;Stengthening;Right;Left;2 sets;10 reps    Hip Extension  AROM;Stengthening;Right;Left;10 reps;Knee straight      Knee/Hip Exercises: Supine   Bridges  Strengthening;Both;10 reps    Bridges with Clamshell  Strengthening;Both;10 reps      Knee/Hip Exercises: Sidelying   Hip ABduction   AROM;Strengthening;Right;Left;10 reps             PT Education - 02/05/18 1045    Education provided  Yes    Education Details  HEP review; TENS unit     Person(s) Educated  Patient    Methods  Explanation;Tactile cues;Verbal cues;Handout    Comprehension  Verbalized understanding;Returned demonstration;Verbal cues required;Tactile cues required          PT Long Term Goals - 02/05/18 1025      PT LONG TERM GOAL #1   Title  Increase strength bilat LE's to =/> 5-/5  ( 3/6/ 19)     Time  6    Period  Weeks    Status  Partially Met      PT LONG TERM GOAL #2   Title  Decrease pain Rt hip by 50-75% allowing patient to return to normal functional activities 12/18/17    Time  6    Period  Weeks    Status  Achieved      PT LONG TERM GOAL #3   Title  Improve gait pattern with patient to ambulate with improved wt shift and wt bearing for functional distances =/> 400-500 ft 12/18/17    Time  6    Period  Weeks    Status  Achieved      PT LONG TERM GOAL #4   Title  Independent in HEP     Time  6    Period  Weeks    Status  Achieved      PT LONG TERM GOAL #5   Title  Improve FOTO to </= 38% limitation 02/10/18    Time  6    Period  Weeks    Status  Achieved      PT LONG TERM GOAL #6   Title  report =/> 75% reduction in pain in the Lt knee to allow her to walk a mile again ( 02/10/18)     Time  6    Period  Weeks    Status  Achieved  Plan - 02/05/18 1059    Clinical Impression Statement  Excellent progress overall. Brandy Jackson reports decreased pain and improved strength and mobility. She has significant increase in strength bilat LE. She has returned to normal functional activities and is exerciseing on a daily basis. Goals of therapy have been partially accomplished and patient will continue with independent HEP. She will call with any problems or questions.     Rehab Potential  Good    PT Frequency  1x / week    PT Duration  6 weeks    PT  Treatment/Interventions  Patient/family education;ADLs/Self Care Home Management;Cryotherapy;Electrical Stimulation;Iontophoresis 64m/ml Dexamethasone;Moist Heat;Ultrasound;Dry needling;Manual techniques;Neuromuscular re-education;Therapeutic activities;Therapeutic exercise    PT Next Visit Plan  d/c to independent HEP     Consulted and Agree with Plan of Care  Patient       Patient will benefit from skilled therapeutic intervention in order to improve the following deficits and impairments:  Postural dysfunction, Improper body mechanics, Increased muscle spasms, Increased fascial restricitons, Decreased strength, Decreased range of motion, Decreased mobility, Decreased activity tolerance, Abnormal gait  Visit Diagnosis: Pain in right hip  Acute pain of left knee  Other symptoms and signs involving the musculoskeletal system  Other abnormalities of gait and mobility     Problem List Patient Active Problem List   Diagnosis Date Noted  . Hypertrophic cardiomyopathy ([place] 10/04/2017  . Acute on chronic diastolic CHF (congestive heart failure) ([place] 10/04/2017  . 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 Psoriatic arthritis ([place] 07/31/2015  . Vitamin D deficiency 04/13/2015  . Mood changes 03/16/2015  . 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 Bilateral lower extremity edema 09/15/2014  . Malfunction of device 08/28/2014  . Laryngitis 08/18/2014  . Type II diabetes mellitus with manifestations ([place] 08/04/2014  . Peripheral neuropathy due to chemotherapy ([place] 08/04/2014  . Diarrhea 08/04/2014  . Fatigue due to treatment 08/04/2014  . Rectal bleed 06/30/2014  . Post-operative state 04/24/2014  . Breast cancer of upper-outer quadrant of right female breast ([place] 03/03/2014    Brandy Jackson PNilda SimmerPT, MPH  02/05/2018, 11:02 AM  [facility] [address]Phone: [phone]  Fax:  [phone] Name:  Brandy BOSCHERTMRN: [id number] of Birth: 611/25/1952  PHYSICAL THERAPY DISCHARGE SUMMARY  Visits from Start of Care: 15  Current functional level related to goals / functional outcomes: See progress note for discharge status    Remaining deficits: Lt knee pain - structural changes/deformity; decreased Lt hip extension/knee flexion strength    Education / Equipment: HEP  Plan: Patient agrees to discharge.  Patient goals were partially met. Patient is being discharged due to being pleased with the current functional level.  ?????    Brandy Jackson P. HHelene KelpPT, MPH 02/05/18 11:04 AM

## 2018-02-05 NOTE — Patient Instructions (Signed)

## 2018-02-18 ENCOUNTER — Ambulatory Visit (INDEPENDENT_AMBULATORY_CARE_PROVIDER_SITE_OTHER): Payer: Non-veteran care | Admitting: Podiatry

## 2018-02-18 ENCOUNTER — Encounter: Payer: Self-pay | Admitting: Podiatry

## 2018-02-18 DIAGNOSIS — M79676 Pain in unspecified toe(s): Secondary | ICD-10-CM | POA: Diagnosis not present

## 2018-02-18 DIAGNOSIS — Q828 Other specified congenital malformations of skin: Secondary | ICD-10-CM

## 2018-02-18 DIAGNOSIS — B351 Tinea unguium: Secondary | ICD-10-CM | POA: Diagnosis not present

## 2018-02-20 NOTE — Progress Notes (Signed)
Presents today chief complaint of painful elongated toenails and calluses bilaterally.  Denies any other complications.  Objective: Vital signs are stable alert and oriented x3.  Pulses are palpable.  Neurologic sensorium is intact.  Deep tendon reflexes are intact.  Toenails are long thick yellow dystrophic-like mycotic multiple reactive hyperkeratosis plantar aspect of the bilateral foot.  Assessment: Pain in limb secondary to onychomycosis and tylomas.  Plan: Debridement of all reactive hyperkeratosis bilateral.  Debridement of toenails 1 through 5 bilateral.  Follow-up with her in 3 months.

## 2018-03-22 ENCOUNTER — Telehealth: Payer: Self-pay | Admitting: Podiatry

## 2018-03-22 DIAGNOSIS — G629 Polyneuropathy, unspecified: Secondary | ICD-10-CM

## 2018-03-22 NOTE — Telephone Encounter (Signed)
Patient called and stated that she had seen Dr. Milinda Pointer. She said he had told her that she needed to see a neurologist. Ms. Fore was wondering could Dr. Milinda Pointer send over a referral to [facility] since that have a neurologist there but she needs the referral in order to get seen. Fax number is [phone] /Attention Dr. Karena Addison.

## 2018-03-22 NOTE — Telephone Encounter (Signed)
Diabetic neuropathy

## 2018-03-23 ENCOUNTER — Telehealth: Payer: Self-pay | Admitting: Podiatry

## 2018-03-23 NOTE — Telephone Encounter (Signed)
Patient called and wanted Dr. Milinda Pointer to send a referral out to a Neurologist. She stated he wanted her to see one for the nerve damage she has. She's tried to call and make an apt with them but they wont see her unless they received a referral. If you can call her back to let her know you got this message. The number is [phone]

## 2018-03-23 NOTE — Telephone Encounter (Signed)
Left message informing pt the referral was faxed to the [facility] after I received referral order and dx from Dr. Milinda Pointer.

## 2018-03-23 NOTE — Telephone Encounter (Signed)
Faxed referral to [place], with clinicals and demographics.

## 2018-03-23 NOTE — Addendum Note (Signed)
Addended by: Harriett Sine D on: 03/23/2018 11:00 AM   Modules accepted: Orders

## 2018-05-25 ENCOUNTER — Ambulatory Visit (INDEPENDENT_AMBULATORY_CARE_PROVIDER_SITE_OTHER): Payer: Non-veteran care | Admitting: Podiatry

## 2018-05-25 ENCOUNTER — Other Ambulatory Visit: Payer: Self-pay

## 2018-05-25 ENCOUNTER — Encounter: Payer: Self-pay | Admitting: Podiatry

## 2018-05-25 DIAGNOSIS — M79676 Pain in unspecified toe(s): Secondary | ICD-10-CM | POA: Diagnosis not present

## 2018-05-25 DIAGNOSIS — B351 Tinea unguium: Secondary | ICD-10-CM

## 2018-05-25 NOTE — Progress Notes (Signed)
She presents today chief complaint of painful elongated toenails 1 through 5 bilateral.  Objective: Vital signs are stable alert and oriented x3.  Pulses are palpable.  Neurologic sensorium is intact.  Deep tendon reflexes are intact muscle strength is normal symmetrical.  Toenails are long thick yellow dystrophic with mycotic painful palpation as well as debridement.  Assessment: Pain in limb secondary to onychomycosis.  Plan: Debridement of toenails 1 through 5 bilateral cover service secondary to pain.  Follow-up with me in 3 months

## 2018-08-26 ENCOUNTER — Ambulatory Visit: Payer: Non-veteran care | Admitting: Podiatry

## 2018-12-28 ENCOUNTER — Ambulatory Visit: Payer: Medicare Other | Admitting: Podiatry

## 2019-12-27 ENCOUNTER — Other Ambulatory Visit (HOSPITAL_BASED_OUTPATIENT_CLINIC_OR_DEPARTMENT_OTHER): Payer: Self-pay | Admitting: Family

## 2019-12-27 DIAGNOSIS — Z1231 Encounter for screening mammogram for malignant neoplasm of breast: Secondary | ICD-10-CM

## 2020-01-03 ENCOUNTER — Ambulatory Visit (HOSPITAL_BASED_OUTPATIENT_CLINIC_OR_DEPARTMENT_OTHER): Payer: Non-veteran care

## 2020-11-12 ENCOUNTER — Ambulatory Visit (INDEPENDENT_AMBULATORY_CARE_PROVIDER_SITE_OTHER): Payer: No Typology Code available for payment source | Admitting: Physical Therapy

## 2020-11-12 ENCOUNTER — Encounter: Payer: Self-pay | Admitting: Physical Therapy

## 2020-11-12 ENCOUNTER — Other Ambulatory Visit: Payer: Self-pay

## 2020-11-12 DIAGNOSIS — M25662 Stiffness of left knee, not elsewhere classified: Secondary | ICD-10-CM | POA: Diagnosis not present

## 2020-11-12 DIAGNOSIS — M25562 Pain in left knee: Secondary | ICD-10-CM | POA: Diagnosis not present

## 2020-11-12 DIAGNOSIS — G8929 Other chronic pain: Secondary | ICD-10-CM | POA: Diagnosis not present

## 2020-11-12 NOTE — Patient Instructions (Signed)
Access Code: [email or web address] URL: [email or web address] Date: 11/12/2020 Prepared by: Almyra Free  Exercises Seated Hamstring Stretch with Chair - 2 x daily - 7 x weekly - 3 reps - 1 sets - 30-60 sec hold Supine Piriformis Stretch with Leg Straight - 2 x daily - 7 x weekly - 3 reps - 1 sets - 30 sec hold Supine Heel Slide with Strap - 1 x daily - 7 x weekly - 3 sets - 10 reps

## 2020-11-12 NOTE — Therapy (Signed)
[facility] [place], [place], [address] Phone: [phone]   Fax:  [phone]  Physical Therapy Evaluation  Patient Details  Name: Brandy Jackson MRN: [id number] Date of Birth: 02-20-1951 Referring Provider (PT): Lottie Dawson, MD (Resident); Melynda Keller FNP (PCP in Epic)   Encounter Date: 11/12/2020   PT End of Session - 11/12/20 1346    Visit Number 1    Number of Visits 15    Date for PT Re-Evaluation 01/07/21    Authorization Type [facility]    Authorization - Visit Number 1    Authorization - Number of Visits 15    PT Start Time 9201    PT Stop Time 1436    PT Time Calculation (min) 49 min    Activity Tolerance Patient tolerated treatment well    Behavior During Therapy WFL for tasks assessed/performed           Past Medical History:  Diagnosis Date  . Breast cancer ([place])   . Cancer ([place])   . Diabetes mellitus without complication ([place])   . Hot flashes   . Psoriatic arthritis ([place])   . 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 Wears glasses   . Wears partial dentures    bottom    Past Surgical History:  Procedure Laterality Date  . BLADDER REPAIR W/ CESAREAN SECTION    . COLONOSCOPY    . PORTACATH PLACEMENT N/A 04/11/2014   Procedure: INSERTION PORT-A-CATH;  Surgeon: Joyice Faster. Cornett, MD;  Location: [facility];  Service: General;  Laterality: N/A;  . RE-EXCISION OF BREAST LUMPECTOMY Right 06/06/2014   Procedure: RE-EXCISION OF BREAST LUMPECTOMY;  Surgeon: Marcello Moores A. Cornett, MD;  Location: [facility];  Service: General;  Laterality: Right;    There were no vitals filed for this visit.    Subjective Assessment - 11/12/20 1352    Subjective Patient reporting left knee pain on the outside. Hurts with prolonged sitting (10 min). She must hold onto something when she gets up. It also hurts with walking. Patient also concerned about  left great toenail discoloration.    Pertinent History Breast CA, DM, psoriatic arthritis    How long can you sit comfortably? 10 min    Patient Stated Goals be more mobile so she can be more active, pain free    Currently in Pain? Yes    Pain Score 4     Pain Location Knee    Pain Orientation Left    Pain Descriptors / Indicators Stabbing    Pain Type Chronic pain    Pain Onset More than a month ago    Pain Frequency Constant    Aggravating Factors  sitting, walking    Pain Relieving Factors DN, riding bike, warm tub              [facility] PT Assessment - 11/12/20 0001      Assessment   Medical Diagnosis left knee pain    Referring Provider (PT) Lottie Dawson, MD (Resident); Angelizque Guiteau-Laurent FNP (PCP in Epic)    Onset Date/Surgical Date 10/15/20    Hand Dominance Right    Prior Therapy yes      Precautions   Precautions Other (comment)    Precaution Comments h/o breast CA      Restrictions   Weight Bearing Restrictions No      Balance Screen   Has the patient fallen in the past 6 months No  Has the patient had a decrease in activity level because of a fear of falling?  No    Is the patient reluctant to leave their home because of a fear of falling?  No      Home Ecologist residence    Living Arrangements Spouse/significant other    Additional Comments couple steps to get in; railing; uses step to gait      Prior Function   Level of Independence Independent    Vocation Retired    Leisure stationary bike daily      Observation/Other Assessments   Observations brusing of left great toe nail; no tenderness to palpation; good strength and mobility of great toe    Focus on Therapeutic Outcomes (FOTO)  deferred due to time      Posture/Postural Control   Posture Comments bil hallux valgus, valgus deformity left knee; left hip ADDuction and ER       ROM / Strength   AROM / PROM / Strength AROM;Strength;PROM      AROM   Overall  AROM Comments 0-112 deg right knee flex    AROM Assessment Site Knee    Right/Left Knee Left    Left Knee Extension -15    Left Knee Flexion 95      PROM   PROM Assessment Site Knee    Right/Left Knee Left    Left Knee Extension -10    Left Knee Flexion 96      Strength   Overall Strength Comments left knee 5/5, left hip ABD 4+/5      Flexibility   Soft Tissue Assessment /Muscle Length yes    Hamstrings marked bil Left >Right    Quadriceps marked bil     ITB + ober left    Piriformis marked bil      Palpation   Patella mobility decreased medial and lateral  glide    Palpation comment tender along lateral and posterior left knee      Special Tests   Other special tests neg knee special tests; except + ober left      Ambulation/Gait   Ambulation/Gait Yes    Ambulation/Gait Assistance 7: Independent    Ambulation Distance (Feet) 30 Feet    Assistive device None    Gait Pattern Step-through pattern;Decreased dorsiflexion - right;Decreased dorsiflexion - left;Decreased stance time - left;Left flexed knee in stance;Wide base of support;Poor foot clearance - left;Poor foot clearance - right    Ambulation Surface Level                      Objective measurements completed on examination: See above findings.               PT Education - 11/12/20 1435    Education Details HEP    Person(s) Educated Patient    Methods Explanation;Demonstration;Handout    Comprehension Verbalized understanding;Returned demonstration            PT Short Term Goals - 11/12/20 1558      PT SHORT TERM GOAL #1   Title ind with initial HEP    Time 4    Period Weeks    Status New    Target Date 12/10/20             PT Long Term Goals - 11/12/20 1558      PT LONG TERM GOAL #1   Title decreased pain in left knee by >= 75% with sitting and  walking.    Time 8    Period Weeks    Status New    Target Date 01/07/21      PT LONG TERM GOAL #2   Title Improved left  knee flexion to 5-110 deg or greater to normalize functional mobility.    Time 8    Period Weeks    Status New      PT LONG TERM GOAL #3   Title pt able to demo improved BLE strength to allow reciprocal gait on stairs.    Time 8    Period Weeks    Status New      PT LONG TERM GOAL #4   Title Independent in HEP     Time 8    Period Weeks    Status New                  Plan - 11/12/20 1546    Clinical Impression Statement Patient presents to PT with c/o left knee pain and stiffness of a chronic nature but which has worsened in the past couple of months. She has marked limitations in left knee flexion and extension and flexiblity deficits in BLE. She has tenderness along lateral and post left knee. Her patellar mobility is limited as well. She has some hip weakness as well and has to use a step to gait on steps. She was concerned with her left great toe nail which is bruised. She denies injuring the toe and has no tenderness, but due to hallux valgus may be putting pressure through the nail inside her shoe. PT advised trying a spacer. Patient will benefit from PT to address these deficits. She reported to PT that it was a 50 min drive to this clinic, so our [facility] location was recommended for pt convenience.    Personal Factors and Comorbidities Comorbidity 3+;Past/Current Experience    Comorbidities Breast CA, DM, psoriatic arthritis    Examination-Activity Limitations Sit;Stairs    Stability/Clinical Decision Making Stable/Uncomplicated    Clinical Decision Making Low    Rehab Potential Excellent    PT Frequency 2x / week    PT Duration 8 weeks    PT Treatment/Interventions ADLs/Self Care Home Management;Iontophoresis 4mg /ml Dexamethasone;Stair training;Therapeutic activities;Therapeutic exercise;Neuromuscular re-education;Manual techniques;Patient/family education;Dry needling;Taping    PT Next Visit Plan complete FOTO (deferred due to time); Review HEP and progress with  flexibility for knees and hips; Manual therapy to post/lat knee; (pt has had relief with DN in the past)    PT Home Exercise Plan [email or web address]    Consulted and Agree with Plan of Care Patient           Patient will benefit from skilled therapeutic intervention in order to improve the following deficits and impairments:  Abnormal gait, Decreased range of motion, Pain, Impaired flexibility, Decreased strength, Postural dysfunction  Visit Diagnosis: Chronic pain of left knee - Plan: PT plan of care cert/re-cert  Stiffness of left knee, not elsewhere classified - Plan: PT plan of care cert/re-cert     Problem List Patient Active Problem List   Diagnosis Date Noted  . Hypertrophic cardiomyopathy ([place]) 10/04/2017  . Acute on chronic diastolic CHF (congestive heart failure) ([place]) 10/04/2017  . 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 Psoriatic arthritis ([place]) 07/31/2015  . Vitamin D deficiency 04/13/2015  . Mood changes 03/16/2015  . 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 Bilateral lower extremity edema 09/15/2014  . Malfunction of device 08/28/2014  . Laryngitis 08/18/2014  . Type II diabetes  mellitus with manifestations ([place]) 08/04/2014  . Peripheral neuropathy due to chemotherapy ([place]) 08/04/2014  . Diarrhea 08/04/2014  . Fatigue due to treatment 08/04/2014  . Rectal bleed 06/30/2014  . Post-operative state 04/24/2014  . Breast cancer of upper-outer quadrant of right female breast ([place]) 03/03/2014    Madelyn Flavors PT 11/12/2020, 4:06 PM  [facility] [place], [place], [address] Phone: [phone]   Fax:  [phone]  Name: KENTLEY CEDILLO MRN: [id number] Date of Birth: December 09, 1950

## 2020-11-21 NOTE — Progress Notes (Signed)
Assessment/Plan:   1.  Tremor, by history  -Reassured the patient that I saw no evidence of Parkinson's disease.  I actually saw no evidence of tremor today at all.  -Patient does have gait abnormalities, but I suspect that these are more orthopedic an issue.  She has significant valgus deformity of the knees.  She has associated knee pain.  2.  Memory loss  -we will schedule neurocognitive testing.  -suspect that she does have a neurodegenerative process  -mri reviewed with the patient - she brought CD from the [place] [facility]  -do B12, folate, RPR, TSH  -We will follow up with her, depending on the results  3.  Left pinky paresthesias  -Only transient and been going on for a few minutes sitting in my office.  Suspect that she was just leaning on it on the armrest on the chair, transiently affecting the ulnar nerve.  Did not see any objective focal neurologic findings in the office today.  4.  Psoriatic arthritis  -Under the care of rheumatology.  Subjective:   Brandy Jackson was seen today in the movement disorders clinic for neurologic consultation at the request of [facility]  The consultation is for the evaluation of memory change and tremor.  Tremor: Yes.     How long has it been going on? 2-3 months  At rest or with activation?  If holds it out steady in front of her she will note the tremor or if the R hand is at the side  Fam hx of tremor?  No.  Located where?  Started in the L hand but some in the R as well (she is R hand)  Affected by caffeine:  No. (drinks about a quart of caffeine beverage per day)  Affected by alcohol: doesn't drink any  Affected by stress:  No.  Affected by fatigue:  No.  Spills soup if on spoon:  No.  Spills glass of liquid if full:  No.  Affects ADL's (tying shoes, brushing teeth, etc):  No.  Tremor inducing meds:  No.   Tremor meds given: Patient was apparently given gabapentin without relief.  Other Specific Symptoms:   Voice: no change Sleep: sleeps well  Vivid Dreams:  No.  Acting out dreams:  No. Wet Pillows: No. Postural symptoms:  Yes.    Falls?  No. Bradykinesia symptoms: shuffling gait and difficulty getting out of a chair; drags the L foot Loss of smell:  No. Loss of taste:  No. Urinary Incontinence:  No. but has urinary frequency Difficulty Swallowing:  No. Handwriting, micrographia: No. Trouble with ADL's:  No.  Trouble buttoning clothing: No. Depression:  No. Memory changes:  Yes.  , had some testing done by [facility] and they were concerned about memory.  They were going to refer for neurocog testing but wasn't done.    Living situation:  Pt lives with their spouse.  The patient does not do the finances in the home.  Husband has always done them. The patient does drive.  Some trouble with getting lost but "i've always had direction problems."   There have no been any motor vehicle accidents in the recent years.  The patient does cook.  There is no difficulty remembering common recipes.  The stovetop has not been left on accidentally but she admits to leaving the faucet on  ADL's:  The patient self medicates.  N/V:  No. Lightheaded:  No.  Syncope: No. Diplopia:  No. Dyskinesia:  No.  She does c/o some paresthesias of the L paresthesias over the L pinky finger x 10 min.  No paresthesias of the remainder of the left arm, face, or leg.  No changes in speech.  No changes in strength.  Neuroimaging of the brain has  previously been performed.  It  available for my review today.  Patient had MRI of the brain without gadolinium on June 26, 2020 at the [facility].  Patient brought me the films and I reviewed them personally..  This demonstrated mild chronic small vessel disease and  moderate atrophy.  MRA brain was unremarkable.    ALLERGIES:   Allergies  Allergen Reactions  . Sulfa Antibiotics Rash    CURRENT MEDICATIONS:  Current Outpatient Medications  Medication Instructions  .  calcipotriene (DOVONOX) 7.628 % cream 1 application, Topical, 2 times daily PRN  . cholecalciferol (VITAMIN D) 1,000 Units, Oral, Daily  . clobetasol (TEMOVATE) 0.05 % external solution 1 application, Topical, 2 times daily  . Cyanocobalamin (VITAMIN B-12 PO) 1 tablet, Oral, Daily  . cyclobenzaprine (FLEXERIL) 10 mg, Oral, Every 8 hours PRN  . diclofenac sodium (VOLTAREN) 1 % GEL 1 application, Topical, 2 times daily PRN  . ferrous sulfate 324 (65 Fe) MG TBEC 1 tablet, Oral, Daily  . Fish Oil 1,000 mg, Oral, Daily  . folic acid (FOLVITE) 1 mg, Oral, See admin instructions, Take 1 tablet (1 mg) by mouth every day except Saturday (do not take on the same day as methotrexate)  . HYDROcodone-homatropine (HYCODAN) 5-1.5 MG/5ML syrup hydrocodone-homatropine 5 mg-1.5 mg/5 mL oral syrup  . ketoconazole (NIZORAL) 2 % cream 1 application, Topical, Daily  . losartan (COZAAR) 25 mg, Oral, Daily  . metFORMIN (GLUCOPHAGE) 500 mg, Oral, Daily  . methotrexate (RHEUMATREX) 10 mg, Oral, 2 times weekly, Caution:Chemotherapy. Protect from light Saturday   . METHOTREXATE PO 4 tablets, Oral, See admin instructions, Take 4 tablets by mouth Saturday morning and take 4 tablets Saturday night   . metoprolol tartrate (LOPRESSOR) 12.5 mg, Oral, 2 times daily  . naproxen (NAPROSYN) 500 mg, Oral, Daily PRN  . simvastatin (ZOCOR) 10 mg, Oral, Daily    Objective:   PHYSICAL EXAMINATION:    VITALS:   Vitals:   11/26/20 1250  BP: 114/60  Pulse: 72  SpO2: 97%  Weight: 188 lb (85.3 kg)  Height: 5\' 7"  (1.702 m)    GEN:  The patient appears stated age and is in NAD. HEENT:  Normocephalic, atraumatic.  The mucous membranes are moist. The superficial temporal arteries are without ropiness or tenderness. CV:  RRR Lungs:  CTAB Neck/HEME:  There are no carotid bruits bilaterally. Musculoskeletal: She has arthritic deformities of the hands.  Neurological examination:  Orientation: The patient is alert and oriented x3  and actually writes out for me the month, date and year.  However, she does have difficulty focusing and staying on topic at times.  She is unable to state the months backwards, not even being able to start them after the examiner starts the first 2 for her. Cranial nerves: There is good facial symmetry.  Extraocular muscles are intact. The visual fields are full to confrontational testing. The speech is fluent and clear. Soft palate rises symmetrically and there is no tongue deviation. Hearing is intact to conversational tone. Sensation: Sensation is intact to light touch throughout (facial, trunk, extremities). Vibration is intact at the bilateral big toe. There is no extinction with double simultaneous stimulation.  Motor: Strength is 5/5 in the bilateral  upper and lower extremities.   Shoulder shrug is equal and symmetric.  There is no pronator drift. Deep tendon reflexes: Deep tendon reflexes are 0-1/4 at the bilateral biceps, triceps, brachioradialis, patella and achilles. Plantar responses are downgoing bilaterally.   Movement examination: Tone: There is normal tone in the bilateral upper extremities.  The tone in the lower extremities is normal.  Abnormal movements: no rest tremor.  No postural tremor Coordination:  There is no decremation with RAM's, with any form of RAMS, including alternating supination and pronation of the forearm, hand opening and closing, finger taps, heel taps and toe taps. Gait and Station: The patient has mild difficulty arising out of a deep-seated chair without the use of the hands. The patient's stride length is decreased but she has significant valgus deformities of the knees.  I have reviewed and interpreted the following labs independently   Chemistry      Component Value Date/Time   NA 136 10/05/2017 0556   NA 140 04/17/2016 1532   K 3.5 10/05/2017 0556   K 3.8 04/17/2016 1532   CL 104 10/05/2017 0556   CO2 23 10/05/2017 0556   CO2 26 04/17/2016 1532    BUN 14 10/05/2017 0556   BUN 16.8 04/17/2016 1532   CREATININE 0.60 10/05/2017 0556   CREATININE 0.8 04/17/2016 1532      Component Value Date/Time   CALCIUM 9.1 10/05/2017 0556   CALCIUM 9.7 04/17/2016 1532   ALKPHOS 70 04/17/2016 1532   AST 15 04/17/2016 1532   ALT 21 04/17/2016 1532   BILITOT <0.30 04/17/2016 1532      No results found for: TSH Lab Results  Component Value Date   WBC 10.3 10/04/2017   HGB 12.5 10/04/2017   HCT 38.3 10/04/2017   MCV 84.9 10/04/2017   PLT 296 10/04/2017   She had lab work at the [facility] on June 26, 2020.  AST 40, ALT 48, white blood cells 4.0, hemoglobin 13.1, hematocrit 41.4, sodium 142, hemoglobin A1c 7.8   Total time spent on today's visit was 60 minutes, including both face-to-face time and nonface-to-face time.  Time included that spent on review of records (prior notes available to me/labs/imaging if pertinent), discussing treatment and goals, answering patient's questions and coordinating care.  Cc:  Ricke Hey, MD

## 2020-11-26 ENCOUNTER — Encounter: Payer: Self-pay | Admitting: Neurology

## 2020-11-26 ENCOUNTER — Other Ambulatory Visit (INDEPENDENT_AMBULATORY_CARE_PROVIDER_SITE_OTHER): Payer: No Typology Code available for payment source

## 2020-11-26 ENCOUNTER — Ambulatory Visit (INDEPENDENT_AMBULATORY_CARE_PROVIDER_SITE_OTHER): Payer: No Typology Code available for payment source | Admitting: Neurology

## 2020-11-26 ENCOUNTER — Other Ambulatory Visit: Payer: Self-pay

## 2020-11-26 VITALS — BP 114/60 | HR 72 | Ht 67.0 in | Wt 188.0 lb

## 2020-11-26 DIAGNOSIS — R251 Tremor, unspecified: Secondary | ICD-10-CM

## 2020-11-26 DIAGNOSIS — G5622 Lesion of ulnar nerve, left upper limb: Secondary | ICD-10-CM

## 2020-11-26 DIAGNOSIS — L4050 Arthropathic psoriasis, unspecified: Secondary | ICD-10-CM

## 2020-11-26 DIAGNOSIS — M21069 Valgus deformity, not elsewhere classified, unspecified knee: Secondary | ICD-10-CM

## 2020-11-26 DIAGNOSIS — R413 Other amnesia: Secondary | ICD-10-CM

## 2020-11-26 DIAGNOSIS — R5383 Other fatigue: Secondary | ICD-10-CM | POA: Diagnosis not present

## 2020-11-26 LAB — TSH: TSH: 2.19 u[IU]/mL (ref 0.35–4.50)

## 2020-11-26 NOTE — Patient Instructions (Addendum)
Your provider has requested that you have labwork completed today. Please go to [facility] ([address]) on the second floor of this building before leaving the office today. You do not need to check in. If you are not called within 15 minutes please check with the front desk.    You have been referred for a neurocognitive evaluation (i.e., evaluation of memory and thinking abilities). Please bring someone with you to this appointment if possible, as it is helpful for the neuropsychologist to hear from both you and another adult who knows you well. Please bring eyeglasses and hearing aids if you wear them.    The evaluation will take approximately 3 hours and has two parts:   . The first part is a clinical interview with the neuropsychologist, Dr. Melvyn Novas or Dr. Nicole Kindred. During the interview, the neuropsychologist will speak with you and the individual you brought to the appointment.    . The second part of the evaluation is testing with the doctor's technician, aka psychometrician, Hinton Dyer or [employer]. During the testing, the technician will ask you to remember different types of material, solve problems, and answer some questionnaires. Your family member will not be present for this portion of the evaluation.   Please note: We have to reserve several hours of the neuropsychologist's time and the psychometrician's time for your evaluation appointment. As such, there is a No-Show fee of $100. If you are unable to attend any of your appointments, please contact our office as soon as possible to reschedule.

## 2020-11-27 LAB — RPR: RPR Ser Ql: REACTIVE — AB

## 2020-11-27 LAB — FLUORESCENT TREPONEMAL AB(FTA)-IGG-BLD: Fluorescent Treponemal ABS: NONREACTIVE

## 2020-11-27 LAB — B12 AND FOLATE PANEL
Folate: >23.6 ng/mL (ref >5.9)
Vitamin B-12: 1265 pg/mL — ABNORMAL HIGH (ref 211–911)

## 2020-11-27 LAB — RPR TITER: RPR Titer: 1:1 {titer} — ABNORMAL HIGH

## 2020-12-18 ENCOUNTER — Ambulatory Visit: Payer: No Typology Code available for payment source | Attending: Family Medicine

## 2020-12-18 ENCOUNTER — Other Ambulatory Visit: Payer: Self-pay

## 2020-12-18 DIAGNOSIS — M25562 Pain in left knee: Secondary | ICD-10-CM | POA: Insufficient documentation

## 2020-12-18 DIAGNOSIS — G8929 Other chronic pain: Secondary | ICD-10-CM | POA: Insufficient documentation

## 2020-12-18 DIAGNOSIS — M25662 Stiffness of left knee, not elsewhere classified: Secondary | ICD-10-CM | POA: Insufficient documentation

## 2020-12-19 NOTE — Therapy (Signed)
[place], [place], [address] Phone: [phone]   Fax:  [phone]  Physical Therapy Treatment  Patient Details  Name: Brandy Jackson MRN: [id number] Date of Birth: [birth year] Referring Provider (PT): Lottie Dawson, MD (Resident); Melynda Keller FNP (PCP in Epic)   Encounter Date: 12/18/2020   PT End of Session - 12/19/20 1452    Visit Number 2    Number of Visits 15    Date for PT Re-Evaluation 01/07/21    Authorization Type [facility] Choice    Authorization - Visit Number 2    Authorization - Number of Visits 15    PT Start Time 4580    PT Stop Time 1530    PT Time Calculation (min) 45 min    Activity Tolerance Patient tolerated treatment well    Behavior During Therapy WFL for tasks assessed/performed           Past Medical History:  Diagnosis Date  . Breast cancer ([place])   . Cancer ([place])   . Diabetes mellitus without complication ([place])   . Hot flashes   . Psoriatic arthritis ([place])   . 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 Wears glasses   . Wears partial dentures    bottom    Past Surgical History:  Procedure Laterality Date  . BLADDER REPAIR W/ CESAREAN SECTION    . COLONOSCOPY    . PORTACATH PLACEMENT N/A 04/11/2014   Procedure: INSERTION PORT-A-CATH;  Surgeon: Joyice Faster. Cornett, MD;  Location: [facility];  Service: General;  Laterality: N/A;  . RE-EXCISION OF BREAST LUMPECTOMY Right 06/06/2014   Procedure: RE-EXCISION OF BREAST LUMPECTOMY;  Surgeon: Marcello Moores A. Cornett, MD;  Location: [facility];  Service: General;  Laterality: Right;    There were no vitals filed for this visit.   Subjective Assessment - 12/18/20 1444    Subjective Patient reports having pain along outside of left knee for a while, especially after sitting for a long time. She states she sometimes has to hold onto something to stand up but not always. I walk 30  minutes everyday.    Pertinent History Breast CA, DM, psoriatic arthritis    How long can you sit comfortably? "About an hour at most"    How long can you stand comfortably? No issues standing still    How long can you walk comfortably? "I really feel better when I walk. It feels good once I get going"    Patient Stated Goals "I want to be able to run, and I want to be able to sit down on the floor with my legs crossed"    Currently in Pain? Yes    Pain Score 6     Pain Location Knee    Pain Orientation Left    Pain Descriptors / Indicators Aching;Dull    Pain Type Chronic pain    Pain Onset More than a month ago    Pain Frequency Intermittent    Aggravating Factors  prolonged sitting    Pain Relieving Factors TPDN, walking/moving, stretching, riding bike    Effect of Pain on Daily Activities Difficulty sitting for prolonged time periods              [facility] PT Assessment - 12/19/20 0001      Assessment   Medical Diagnosis Left knee pain    Referring Provider (PT) Lottie Dawson, MD (Resident); Angelizque Guiteau-Laurent FNP (PCP in Epic)  Onset Date/Surgical Date 10/15/20   "I would say years"   Hand Dominance Right    Next MD Visit Not for current condition    Prior Therapy Yes last year      Precautions   Precautions Other (comment)    Precaution Comments h/o breast CA      Restrictions   Weight Bearing Restrictions No      [place] residence    Living Arrangements Spouse/significant other   husband   Available Help at Discharge Family    Type of [place] to enter    Entrance Stairs-Number of Steps 6   step to gait pattern to ascend and descend   Entrance Stairs-Rails Cannot reach both    [place] One level    [place] - single point    Additional Comments Uses SPC sometimes but not very often. Pt is embarrassed to use SPC and has been trying not to.      Prior Function   Level of  Independence Independent    Vocation Retired    Leisure statioinary bike daily every day for 30 minutes      Cognition   Memory Impaired   pt reports forgetting things throughout the day and recently saw doctor for memory loss     Observation/Other Assessments   Observations Continued bruising of L great toe with no TTP or deficits with strength/mobility noted at evaluation. Advised to contact doctor if she experiences any adverse effects.    Focus on Therapeutic Outcomes (FOTO)  57% functional status; predicted 67% function      Posture/Postural Control   Posture Comments Increased genu valgus in LLE with excessive L hip ER in stance and during gait      AROM   Right Knee Extension 0    Right Knee Flexion 112    Left Knee Extension -14    Left Knee Flexion 95      PROM   Left Knee Extension -14    Left Knee Flexion 95      Strength   Right Hip Flexion 5/5    Right Hip ABduction 4+/5    Right Hip ADduction 4+/5    Left Hip Flexion 4/5    Left Hip ABduction 4/5    Left Hip ADduction 4/5    Right Knee Flexion 5/5    Right Knee Extension 5/5    Left Knee Flexion 4-/5    Left Knee Extension 3-/5   lacks full extension   Right Ankle Dorsiflexion 5/5    Right Ankle Plantar Flexion 5/5   modified test in sitting   Left Ankle Dorsiflexion 5/5    Left Ankle Plantar Flexion 5/5   modified test in sitting     Flexibility   Hamstrings Tightness bilaterally that is greater in LLE                         [facility] Adult PT Treatment/Exercise - 12/19/20 0001      Exercises   Exercises Knee/Hip      Knee/Hip Exercises: Stretches   Passive Hamstring Stretch Left;3 reps;30 seconds    Passive Hamstring Stretch Limitations with green strap    ITB Stretch Left;3 reps;30 seconds    ITB Stretch Limitations with green strap      Knee/Hip Exercises: Supine   Heel Slides AAROM;Left;20 reps      Manual Therapy  Manual Therapy Soft tissue mobilization;Myofascial  release;Passive ROM    Soft tissue mobilization STM and myofascial release along L HS, TFL, and ITB    Passive ROM Knee FL/EXT PROM                  PT Education - 12/19/20 1451    Education Details Reviewed HEP, FOTO and potential progress with skilled PT, POC    Person(s) Educated Patient    Methods Explanation;Demonstration;Tactile cues;Verbal cues;Handout    Comprehension Verbalized understanding;Returned demonstration            PT Short Term Goals - 11/12/20 1558      PT SHORT TERM GOAL #1   Title ind with initial HEP    Time 4    Period Weeks    Status New    Target Date 12/10/20             PT Long Term Goals - 11/12/20 1558      PT LONG TERM GOAL #1   Title decreased pain in left knee by >= 75% with sitting and walking.    Time 8    Period Weeks    Status New    Target Date 01/07/21      PT LONG TERM GOAL #2   Title Improved left knee flexion to 5-110 deg or greater to normalize functional mobility.    Time 8    Period Weeks    Status New      PT LONG TERM GOAL #3   Title pt able to demo improved BLE strength to allow reciprocal gait on stairs.    Time 8    Period Weeks    Status New      PT LONG TERM GOAL #4   Title Independent in HEP     Time 8    Period Weeks    Status New                 Plan - 12/18/20 1445    Clinical Impression Statement Patient is a 70 year old female who presents to OPPT for first follow up visit since initial evaluation at [facility] 11/12/2020 with complaints of L lateral knee pain. She presents with TTP along lateral and posterior L knee, decreased L knee ROM, and strength and flexibility deficits in LLE. She shows the discoloration in L great toe and mentions she was advised to try toe spacers but was unsure of what or where to purchase, so she was provided with handout on where to purchase. Pt presents with increased genu valgum in LLE compared to RLE and antalgic gait. She had initial difficulty with  sit to stand and gait at end of session but reported increased ease once movement is initiated.    Personal Factors and Comorbidities Comorbidity 3+;Past/Current Experience    Comorbidities Breast CA, DM, psoriatic arthritis    Examination-Activity Limitations Sit;Stairs    Stability/Clinical Decision Making Stable/Uncomplicated    Rehab Potential Excellent    PT Frequency 2x / week    PT Duration 8 weeks    PT Treatment/Interventions ADLs/Self Care Home Management;Iontophoresis 4mg /ml Dexamethasone;Stair training;Therapeutic activities;Therapeutic exercise;Neuromuscular re-education;Manual techniques;Patient/family education;Dry needling;Taping    PT Next Visit Plan Review HEP and progress with flexibility for knees and hips; Manual therapy to post/lat knee; (pt has had relief with DN in the past)    PT Home Exercise Plan [id number] - seated and/or supine HS stretch, piriformis stretch, heel slide, IT band stretch    Consulted  and Agree with Plan of Care Patient           Patient will benefit from skilled therapeutic intervention in order to improve the following deficits and impairments:  Abnormal gait,Decreased range of motion,Pain,Impaired flexibility,Decreased strength,Postural dysfunction  Visit Diagnosis: Stiffness of left knee, not elsewhere classified  Chronic pain of left knee     Problem List Patient Active Problem List   Diagnosis Date Noted  . Hypertrophic cardiomyopathy ([place]) 10/04/2017  . Acute on chronic diastolic CHF (congestive heart failure) ([place]) 10/04/2017  . 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 Psoriatic arthritis ([place]) 07/31/2015  . Vitamin D deficiency 04/13/2015  . Mood changes 03/16/2015  . 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 Bilateral lower extremity edema 09/15/2014  . Malfunction of device 08/28/2014  . Laryngitis 08/18/2014  . Type II diabetes mellitus with manifestations ([employer]) 08/04/2014  . Peripheral neuropathy due to chemotherapy ([place])  08/04/2014  . Diarrhea 08/04/2014  . Fatigue due to treatment 08/04/2014  . Rectal bleed 06/30/2014  . Post-operative state 04/24/2014  . Breast cancer of upper-outer quadrant of right female breast ([place]) 03/03/2014     Haydee Monica, PT, DPT 12/19/20 3:19 PM  [place], [place], [address] Phone: [phone]   Fax:  [phone]  Name: Brandy Jackson MRN: [id number] Date of Birth: Nov 07, 1951

## 2020-12-20 ENCOUNTER — Ambulatory Visit: Payer: No Typology Code available for payment source

## 2020-12-20 ENCOUNTER — Other Ambulatory Visit: Payer: Self-pay

## 2020-12-20 DIAGNOSIS — M25662 Stiffness of left knee, not elsewhere classified: Secondary | ICD-10-CM

## 2020-12-20 DIAGNOSIS — M25562 Pain in left knee: Secondary | ICD-10-CM

## 2020-12-20 DIAGNOSIS — G8929 Other chronic pain: Secondary | ICD-10-CM

## 2020-12-20 NOTE — Therapy (Signed)
[place], [place], [address] Phone: [phone]   Fax:  [phone]  Physical Therapy Treatment  Patient Details  Name: Brandy Jackson MRN: [id number] Date of Birth: 1951-06-16 Referring Provider (PT): Lottie Dawson, MD (Resident); Melynda Keller FNP (PCP in Epic)   Encounter Date: 12/20/2020   PT End of Session - 12/20/20 1459    Visit Number 3    Number of Visits 15    Date for PT Re-Evaluation 01/07/21    Authorization Type VA Choice    Authorization - Visit Number 3    Authorization - Number of Visits 15    PT Start Time 1500    PT Stop Time 1540    PT Time Calculation (min) 40 min    Activity Tolerance Patient tolerated treatment well    Behavior During Therapy WFL for tasks assessed/performed           Past Medical History:  Diagnosis Date  . Breast cancer ([place])   . Cancer ([place])   . Diabetes mellitus without complication ([facility])   . Hot flashes   . Psoriatic arthritis ([place])   . 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 Wears glasses   . Wears partial dentures    bottom    Past Surgical History:  Procedure Laterality Date  . BLADDER REPAIR W/ CESAREAN SECTION    . COLONOSCOPY    . PORTACATH PLACEMENT N/A 04/11/2014   Procedure: INSERTION PORT-A-CATH;  Surgeon: Joyice Faster. Cornett, MD;  Location: [facility];  Service: General;  Laterality: N/A;  . RE-EXCISION OF BREAST LUMPECTOMY Right 06/06/2014   Procedure: RE-EXCISION OF BREAST LUMPECTOMY;  Surgeon: Marcello Moores A. Cornett, MD;  Location: [facility];  Service: General;  Laterality: Right;    There were no vitals filed for this visit.   Subjective Assessment - 12/20/20 1503    Subjective Patient reports her knee is feeling pretty good. She is working on her exercises, but is unsure how to use the tennis ball.    Pertinent History Breast CA, DM, psoriatic arthritis    Currently in  Pain? No/denies                             [facility] Adult PT Treatment/Exercise - 12/20/20 0001      Self-Care   Self-Care Other Self-Care Comments    Other Self-Care Comments  see patient eduation      Knee/Hip Exercises: Stretches   Passive Hamstring Stretch Limitations seated 30 sec each    Hip Flexor Stretch Limitations supine hip flexor stretch 1 min LLE    Other Knee/Hip Stretches piriformis stretch 30 sec LLE      Knee/Hip Exercises: Supine   Heel Slides 5 reps;Left    Bridges Limitations 2 x 10    Straight Leg Raises Limitations 2 x 10      Knee/Hip Exercises: Sidelying   Clams 2 x 10      Manual Therapy   Soft tissue mobilization STM/myofascial release to Lt VL, rectus femoris, IT band    Passive ROM Knee FL/EXT PROM                  PT Education - 12/20/20 1534    Education Details education on updated HEP. education on TPDN indications, expectations.    Person(s) Educated Patient    Methods Explanation;Demonstration;Handout;Verbal cues    Comprehension Verbalized understanding;Returned demonstration  PT Short Term Goals - 11/12/20 1558      PT SHORT TERM GOAL #1   Title ind with initial HEP    Time 4    Period Weeks    Status New    Target Date 12/10/20             PT Long Term Goals - 11/12/20 1558      PT LONG TERM GOAL #1   Title decreased pain in left knee by >= 75% with sitting and walking.    Time 8    Period Weeks    Status New    Target Date 01/07/21      PT LONG TERM GOAL #2   Title Improved left knee flexion to 5-110 deg or greater to normalize functional mobility.    Time 8    Period Weeks    Status New      PT LONG TERM GOAL #3   Title pt able to demo improved BLE strength to allow reciprocal gait on stairs.    Time 8    Period Weeks    Status New      PT LONG TERM GOAL #4   Title Independent in HEP     Time 8    Period Weeks    Status New                 Plan - 12/20/20  1523    Clinical Impression Statement Patient has moderate tautness and palpable tenderness about Lt vastus lateralis and IT band with partial release from manual therapy. Patient is very interested in receiving TPDN at future sessions as she has previously experienced good pain relief with this treatment at the [place]. HEP was reviewed with patient stating she has been unable to complete stretches with a strap, so stretches were altered to allow for patient compliance. Overall good tolerance to today's session, though patient quickly fatigues with strengthening exercises on the LLE.    Personal Factors and Comorbidities Comorbidity 3+;Past/Current Experience    Comorbidities Breast CA, DM, psoriatic arthritis    Examination-Activity Limitations Sit;Stairs    Stability/Clinical Decision Making Stable/Uncomplicated    Rehab Potential Excellent    PT Frequency 2x / week    PT Duration 8 weeks    PT Treatment/Interventions ADLs/Self Care Home Management;Iontophoresis 4mg /ml Dexamethasone;Stair training;Therapeutic activities;Therapeutic exercise;Neuromuscular re-education;Manual techniques;Patient/family education;Dry needling;Taping    PT Next Visit Plan Consider TPDN, progress hip/knee strengthening and flexibility as tolerated.    PT Home Exercise Plan [id number]    Consulted and Agree with Plan of Care Patient           Patient will benefit from skilled therapeutic intervention in order to improve the following deficits and impairments:  Abnormal gait,Decreased range of motion,Pain,Impaired flexibility,Decreased strength,Postural dysfunction  Visit Diagnosis: Stiffness of left knee, not elsewhere classified  Chronic pain of left knee     Problem List Patient Active Problem List   Diagnosis Date Noted  . Hypertrophic cardiomyopathy ([place]) 10/04/2017  . Acute on chronic diastolic CHF (congestive heart failure) ([place]) 10/04/2017  . 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 Psoriatic arthritis ([place])  07/31/2015  . Vitamin D deficiency 04/13/2015  . Mood changes 03/16/2015  . 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 Bilateral lower extremity edema 09/15/2014  . Malfunction of device 08/28/2014  . Laryngitis 08/18/2014  . Type II diabetes mellitus with manifestations ([place]) 08/04/2014  . Peripheral neuropathy due to chemotherapy ([place]) 08/04/2014  . Diarrhea 08/04/2014  . Fatigue due  to treatment 08/04/2014  . Rectal bleed 06/30/2014  . Post-operative state 04/24/2014  . Breast cancer of upper-outer quadrant of right female breast ([place]) 03/03/2014   Gwendolyn Grant, PT, DPT, ATC 12/20/20 3:45 PM  [place], [place], [address] Phone: [phone]   Fax:  [phone]  Name: JONTAVIA MOORHOUSE MRN: [id number] Date of Birth: 03-03-1951

## 2020-12-25 ENCOUNTER — Other Ambulatory Visit: Payer: Self-pay

## 2020-12-25 ENCOUNTER — Ambulatory Visit: Payer: No Typology Code available for payment source

## 2020-12-25 DIAGNOSIS — M25562 Pain in left knee: Secondary | ICD-10-CM

## 2020-12-25 DIAGNOSIS — M25662 Stiffness of left knee, not elsewhere classified: Secondary | ICD-10-CM

## 2020-12-25 DIAGNOSIS — G8929 Other chronic pain: Secondary | ICD-10-CM

## 2020-12-25 NOTE — Therapy (Signed)
[place], [place], [address] Phone: [phone]   Fax:  [phone]  Physical Therapy Treatment  Patient Details  Name: Brandy Jackson MRN: [id number] Date of Birth: July 27, 1951 Referring Provider (PT): Lottie Dawson, MD (Resident); Melynda Keller FNP (PCP in Epic)   Encounter Date: 12/25/2020   PT End of Session - 12/25/20 1449    Visit Number 4    Number of Visits 15    Date for PT Re-Evaluation 01/07/21    Authorization Type VA Choice    Authorization - Visit Number 4    Authorization - Number of Visits 15    PT Start Time 1450   pt arrived late   PT Stop Time 1533    PT Time Calculation (min) 43 min    Activity Tolerance Patient tolerated treatment well    Behavior During Therapy WFL for tasks assessed/performed           Past Medical History:  Diagnosis Date  . Breast cancer ([place])   . Cancer ([place])   . Diabetes mellitus without complication ([place])   . Hot flashes   . Psoriatic arthritis ([place])   . 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 Wears glasses   . Wears partial dentures    bottom    Past Surgical History:  Procedure Laterality Date  . BLADDER REPAIR W/ CESAREAN SECTION    . COLONOSCOPY    . PORTACATH PLACEMENT N/A 04/11/2014   Procedure: INSERTION PORT-A-CATH;  Surgeon: Joyice Faster. Cornett, MD;  Location: [facility];  Service: General;  Laterality: N/A;  . RE-EXCISION OF BREAST LUMPECTOMY Right 06/06/2014   Procedure: RE-EXCISION OF BREAST LUMPECTOMY;  Surgeon: Marcello Moores A. Cornett, MD;  Location: [facility];  Service: General;  Laterality: Right;    There were no vitals filed for this visit.   Subjective Assessment - 12/25/20 1449    Subjective Pt reports she did not do her HEP since last session but she plans to after today. She states she was able to continue walking 30 minutes/day aside from when it snowed Sunday.     Pertinent History Breast CA, DM, psoriatic arthritis    How long can you sit comfortably? "About an hour at most"    How long can you stand comfortably? No issues standing still    How long can you walk comfortably? "I really feel better when I walk. It feels good once I get going"    Patient Stated Goals "I want to be able to run, and I want to be able to sit down on the floor with my legs crossed"    Currently in Pain? Yes    Pain Score 3     Pain Location Knee    Pain Orientation Left    Pain Descriptors / Indicators Sore    Pain Type Chronic pain    Pain Onset More than a month ago              [facility] PT Assessment - 12/25/20 0001      Assessment   Medical Diagnosis Left knee pain    Referring Provider (PT) Lottie Dawson, MD (Resident); Melynda Keller FNP (PCP in Epic)    Onset Date/Surgical Date 10/15/20      AROM   Left Knee Extension -14    Left Knee Flexion 95  [facility] Adult PT Treatment/Exercise - 12/25/20 0001      Self-Care   Self-Care Other Self-Care Comments    Other Self-Care Comments  Reviewed HEP. TPDN and expected response. Visual demonstration of genu valgum using props and explanation of increased compressive forces to lateral compartment and tensile forces to medial compartment contributing to patient's lateral knee pain.      Knee/Hip Exercises: Stretches   ITB Stretch Left;3 reps;30 seconds    ITB Stretch Limitations with green strap      Knee/Hip Exercises: Standing   Hip Extension Stengthening;Both;10 reps;Knee straight      Knee/Hip Exercises: Supine   Quad Sets Strengthening;Left    Quad Sets Limitations quad set with 3 sec hold during heel prop with 2# ankle weight proximal to L knee x 2 min    Heel Slides AAROM;Left;10 reps    Bridges with [employer] Strengthening;Both;2 sets;10 reps    Straight Leg Raises Strengthening;Both;2 sets;10 reps    Straight Leg Raises Limitations cues for quad  activation      Manual Therapy   Manual therapy comments Skilled inspection, observation, and palpation during TPDN of L vastus lateralis and ITB by Carolyne Littles.    Soft tissue mobilization STM and myofascial release along L vastus lateralis and TFL            Trigger Point Dry Needling - 12/25/20 0001    Consent Given? Yes    Education Handout Provided Yes   Verbal education provided   Muscles Treated Lower Quadrant Vastus lateralis   L vastus lateralis and ITB   Vastus lateralis Response Twitch response elicited   minimal               PT Education - 12/25/20 1538    Education Details Reviewed HEP. TPDN and expected response. Visual demonstration of genu valgum using props and explanation of increased compressive forces to lateral compartment and tensile forces to medial compartment contributing to patient's lateral knee pain.    Person(s) Educated Patient    Methods Explanation;Demonstration;Verbal cues    Comprehension Verbalized understanding;Returned demonstration            PT Short Term Goals - 12/25/20 1543      PT SHORT TERM GOAL #1   Title ind with initial HEP    Time 4    Period Weeks    Status On-going    Target Date 12/10/20             PT Long Term Goals - 11/12/20 1558      PT LONG TERM GOAL #1   Title decreased pain in left knee by >= 75% with sitting and walking.    Time 8    Period Weeks    Status New    Target Date 01/07/21      PT LONG TERM GOAL #2   Title Improved left knee flexion to 5-110 deg or greater to normalize functional mobility.    Time 8    Period Weeks    Status New      PT LONG TERM GOAL #3   Title pt able to demo improved BLE strength to allow reciprocal gait on stairs.    Time 8    Period Weeks    Status New      PT LONG TERM GOAL #4   Title Independent in HEP     Time 8    Period Weeks    Status New  Plan - 12/25/20 1513    Clinical Impression Statement Patient tolerated  interventions and TPDN well with no adverse effects or complaints of increased pain. She was very pleased with response to TPDN and was able to tolerate exercises throughout remainder of session with somewhat reduced L knee discomfort. Her L knee ROM remains unchanged since previous measurement. She continues to fatigue with exercises but was able to complete full sets slowly prior to taking brief rest breaks.    Personal Factors and Comorbidities Comorbidity 3+;Past/Current Experience    Comorbidities Breast CA, DM, psoriatic arthritis    Examination-Activity Limitations Sit;Stairs    Stability/Clinical Decision Making Stable/Uncomplicated    Rehab Potential Excellent    PT Frequency 2x / week    PT Duration 8 weeks    PT Treatment/Interventions ADLs/Self Care Home Management;Iontophoresis 4mg /ml Dexamethasone;Stair training;Therapeutic activities;Therapeutic exercise;Neuromuscular re-education;Manual techniques;Patient/family education;Dry needling;Taping    PT Next Visit Plan Assess response to TPDN, progress hip/knee strengthening and flexibility as tolerated.    PT Home Exercise Plan [id number]    Consulted and Agree with Plan of Care Patient           Patient will benefit from skilled therapeutic intervention in order to improve the following deficits and impairments:  Abnormal gait,Decreased range of motion,Pain,Impaired flexibility,Decreased strength,Postural dysfunction  Visit Diagnosis: Stiffness of left knee, not elsewhere classified  Chronic pain of left knee     Problem List Patient Active Problem List   Diagnosis Date Noted  . Hypertrophic cardiomyopathy ([place]) 10/04/2017  . Acute on chronic diastolic CHF (congestive heart failure) ([place]) 10/04/2017  . 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 Psoriatic arthritis ([place]) 07/31/2015  . Vitamin D deficiency 04/13/2015  . Mood changes 03/16/2015  . 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 Bilateral lower extremity edema 09/15/2014   . Malfunction of device 08/28/2014  . Laryngitis 08/18/2014  . Type II diabetes mellitus with manifestations ([place]) 08/04/2014  . Peripheral neuropathy due to chemotherapy ([place]) 08/04/2014  . Diarrhea 08/04/2014  . Fatigue due to treatment 08/04/2014  . Rectal bleed 06/30/2014  . Post-operative state 04/24/2014  . Breast cancer of upper-outer quadrant of right female breast ([place]) 03/03/2014     Haydee Monica, PT, DPT 12/25/20 3:45 PM  [facility] [address] [place], [place], [address] Phone: [phone]   Fax:  [phone]  Name: ELAIZA SHOBERG MRN: [id number] Date of Birth: 05/07/1951

## 2020-12-27 ENCOUNTER — Ambulatory Visit: Payer: No Typology Code available for payment source

## 2020-12-27 ENCOUNTER — Other Ambulatory Visit: Payer: Self-pay

## 2020-12-27 DIAGNOSIS — M25662 Stiffness of left knee, not elsewhere classified: Secondary | ICD-10-CM

## 2020-12-27 DIAGNOSIS — G8929 Other chronic pain: Secondary | ICD-10-CM

## 2020-12-27 NOTE — Therapy (Signed)
[place], [place], [address] Phone: [phone]   Fax:  [phone]  Physical Therapy Treatment  Patient Details  Name: Brandy Jackson MRN: [id number] Date of Birth: [birth year] Referring Provider (PT): Lottie Dawson, MD (Resident); Melynda Keller FNP (PCP in Epic)   Encounter Date: 12/27/2020   PT End of Session - 12/27/20 1510    Visit Number 5    Number of Visits 15    Date for PT Re-Evaluation 01/07/21    Authorization Type VA Choice    Authorization - Visit Number 5    Authorization - Number of Visits 15    PT Start Time 1503    PT Stop Time 1545    PT Time Calculation (min) 42 min    Activity Tolerance Patient tolerated treatment well    Behavior During Therapy WFL for tasks assessed/performed           Past Medical History:  Diagnosis Date  . Breast cancer ([place])   . Cancer ([place])   . Diabetes mellitus without complication ([place])   . Hot flashes   . Psoriatic arthritis ([place])   . 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 Wears glasses   . Wears partial dentures    bottom    Past Surgical History:  Procedure Laterality Date  . BLADDER REPAIR W/ CESAREAN SECTION    . COLONOSCOPY    . PORTACATH PLACEMENT N/A 04/11/2014   Procedure: INSERTION PORT-A-CATH;  Surgeon: Joyice Faster. Cornett, MD;  Location: [facility];  Service: General;  Laterality: N/A;  . RE-EXCISION OF BREAST LUMPECTOMY Right 06/06/2014   Procedure: RE-EXCISION OF BREAST LUMPECTOMY;  Surgeon: Marcello Moores A. Cornett, MD;  Location: [facility];  Service: General;  Laterality: Right;    There were no vitals filed for this visit.   Subjective Assessment - 12/27/20 1505    Subjective Patient reports her knee is feeling "wonderful" today without reports of pain and feels the TPDN helped with her pain at last session. She was sore afte r last session, but it didn't last too long.     Pertinent History Breast CA, DM, psoriatic arthritis    Currently in Pain? No/denies                             [facility] Adult PT Treatment/Exercise - 12/27/20 0001      Knee/Hip Exercises: Stretches   Passive Hamstring Stretch Limitations seated 90 sec each      Knee/Hip Exercises: Standing   Heel Raises Limitations 2 x 10    Other Standing Knee Exercises sit <>stand 2 x 8    Other Standing Knee Exercises standing march 1 x 10      Knee/Hip Exercises: Seated   Long Arc Quad Limitations 2 x 10, 1#    Marching Limitations 2 x 20; 1#      Knee/Hip Exercises: Supine   Bridges with Ball Squeeze 2 sets;10 reps      Knee/Hip Exercises: Sidelying   Hip ABduction Limitations 2 x 10      Manual Therapy   Passive ROM knee flexion/extension PROM, patellar mobilizations in all planes grade II-III                    PT Short Term Goals - 12/25/20 1543      PT SHORT TERM GOAL #1   Title ind with initial HEP  Time 4    Period Weeks    Status On-going    Target Date 12/10/20             PT Long Term Goals - 11/12/20 1558      PT LONG TERM GOAL #1   Title decreased pain in left knee by >= 75% with sitting and walking.    Time 8    Period Weeks    Status New    Target Date 01/07/21      PT LONG TERM GOAL #2   Title Improved left knee flexion to 5-110 deg or greater to normalize functional mobility.    Time 8    Period Weeks    Status New      PT LONG TERM GOAL #3   Title pt able to demo improved BLE strength to allow reciprocal gait on stairs.    Time 8    Period Weeks    Status New      PT LONG TERM GOAL #4   Title Independent in HEP     Time 8    Period Weeks    Status New                 Plan - 12/27/20 1501    Clinical Impression Statement Patient tolerated session well today without reports of Lt knee pain. Introduction to standing activity with patient requiring intermittent cues for proper foot placement during sit to  stand transition as well as having difficulty controlling the descent into the chair. Patient demonstrates fair postural stability during marching activity, requiring BUE support during stance on the LLE. Patient's SPC is missing the rubber end piece and patient was educated on need for replacement part as her [facility] is currently unstable.    Personal Factors and Comorbidities Comorbidity 3+;Past/Current Experience    Comorbidities Breast CA, DM, psoriatic arthritis    Examination-Activity Limitations Sit;Stairs    Stability/Clinical Decision Making Stable/Uncomplicated    Rehab Potential Excellent    PT Frequency 2x / week    PT Duration 8 weeks    PT Treatment/Interventions ADLs/Self Care Home Management;Iontophoresis 4mg /ml Dexamethasone;Stair training;Therapeutic activities;Therapeutic exercise;Neuromuscular re-education;Manual techniques;Patient/family education;Dry needling;Taping    PT Next Visit Plan TPDN as needed, progress hip/knee strengthening and flexibility as tolerated. progress standing activity, gait training.    PT Home Exercise Plan [id number]    Consulted and Agree with Plan of Care Patient           Patient will benefit from skilled therapeutic intervention in order to improve the following deficits and impairments:  Abnormal gait,Decreased range of motion,Pain,Impaired flexibility,Decreased strength,Postural dysfunction  Visit Diagnosis: Stiffness of left knee, not elsewhere classified  Chronic pain of left knee     Problem List Patient Active Problem List   Diagnosis Date Noted  . Hypertrophic cardiomyopathy ([place]) 10/04/2017  . Acute on chronic diastolic CHF (congestive heart failure) ([place]) 10/04/2017  . 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 Psoriatic arthritis ([place]) 07/31/2015  . Vitamin D deficiency 04/13/2015  . Mood changes 03/16/2015  . 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 Bilateral lower extremity edema 09/15/2014  . Malfunction of device 08/28/2014  .  Laryngitis 08/18/2014  . Type II diabetes mellitus with manifestations ([place]) 08/04/2014  . Peripheral neuropathy due to chemotherapy ([place]) 08/04/2014  . Diarrhea 08/04/2014  . Fatigue due to treatment 08/04/2014  . Rectal bleed 06/30/2014  . Post-operative state 04/24/2014  . Breast cancer of upper-outer quadrant of right female breast ([place]) 03/03/2014  Gwendolyn Grant, PT, DPT, ATC 12/27/20 4:35 PM  [facility] [address] [place], [place], [address] Phone: [phone]   Fax:  [phone]  Name: Brandy Jackson MRN: [id number] Date of Birth: 02/17/1951

## 2021-01-01 ENCOUNTER — Other Ambulatory Visit: Payer: Self-pay

## 2021-01-01 ENCOUNTER — Ambulatory Visit: Payer: No Typology Code available for payment source

## 2021-01-01 DIAGNOSIS — M25662 Stiffness of left knee, not elsewhere classified: Secondary | ICD-10-CM | POA: Diagnosis not present

## 2021-01-01 DIAGNOSIS — G8929 Other chronic pain: Secondary | ICD-10-CM

## 2021-01-01 NOTE — Therapy (Signed)
[place], [place], [address] Phone: [phone]   Fax:  [phone]  Physical Therapy Treatment  Patient Details  Name: Brandy Jackson MRN: [id number] Date of Birth: Jul 08, 1951 Referring Provider (PT): Lottie Dawson, MD (Resident); Melynda Keller FNP (PCP in Epic)   Encounter Date: 01/01/2021   PT End of Session - 01/01/21 1537    Visit Number 6    Number of Visits 15    Date for PT Re-Evaluation 01/07/21    Authorization Type [facility] Choice    Authorization - Visit Number 6    Authorization - Number of Visits 15    PT Start Time 1532    PT Stop Time 1615    PT Time Calculation (min) 43 min    Activity Tolerance Patient tolerated treatment well    Behavior During Therapy WFL for tasks assessed/performed           Past Medical History:  Diagnosis Date  . Breast cancer ([place])   . Cancer ([place])   . Diabetes mellitus without complication ([place])   . Hot flashes   . Psoriatic arthritis ([place])   . 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 Wears glasses   . Wears partial dentures    bottom    Past Surgical History:  Procedure Laterality Date  . BLADDER REPAIR W/ CESAREAN SECTION    . COLONOSCOPY    . PORTACATH PLACEMENT N/A 04/11/2014   Procedure: INSERTION PORT-A-CATH;  Surgeon: Joyice Faster. Cornett, MD;  Location: [facility];  Service: General;  Laterality: N/A;  . RE-EXCISION OF BREAST LUMPECTOMY Right 06/06/2014   Procedure: RE-EXCISION OF BREAST LUMPECTOMY;  Surgeon: Marcello Moores A. Cornett, MD;  Location: [facility];  Service: General;  Laterality: Right;    There were no vitals filed for this visit.   Subjective Assessment - 01/01/21 1539    Subjective Pt states she has been feeling "good" and states she had relief after previous TPDN.    Pertinent History Breast CA, DM, psoriatic arthritis    How long can you sit comfortably? "About an hour at  most"    How long can you stand comfortably? No issues standing still    How long can you walk comfortably? "I really feel better when I walk. It feels good once I get going"    Patient Stated Goals "I want to be able to run, and I want to be able to sit down on the floor with my legs crossed"    Currently in Pain? Yes    Pain Score 5     Pain Location Knee    Pain Orientation Left    Pain Descriptors / Indicators Sore    Pain Type Chronic pain    Pain Onset More than a month ago              [facility] PT Assessment - 01/01/21 0001      Assessment   Medical Diagnosis Left knee pain    Referring Provider (PT) Lottie Dawson, MD (Resident); Melynda Keller FNP (PCP in Epic)    Onset Date/Surgical Date 10/15/20      AROM   Left Knee Extension -14    Left Knee Flexion 95                         [facility] Adult PT Treatment/Exercise - 01/01/21 0001      Ambulation/Gait   Stairs Yes  Stairs Assistance 5: Supervision    Stairs Assistance Details (indicate cue type and reason) Cues for reciprocal pattern - pt able to ascend but not descend with reciprocal pattern. Then practiced step up/downs on 4" step with focus on L quad eccentric control.    Number of Stairs 30   4" steps x 6 times ascending/descending   Height of Stairs 4      Self-Care   Self-Care Other Self-Care Comments    Other Self-Care Comments  Upon arrival, she states she may have questions regarding HEP but could not think of any when asked. Reviewed HEP and rationale for exercises and how interventions and stair training allow for improved safety and functional strength.      Knee/Hip Exercises: Stretches   Passive Hamstring Stretch Both;2 reps;60 seconds      Knee/Hip Exercises: Aerobic   Nustep L4 x 6 min      Knee/Hip Exercises: Standing   Heel Raises Both;2 sets;15 reps    Heel Raises Limitations on airex    Hip Flexion Limitations Alternating marches on airex 2 x 15 each LE    Hip  Abduction Stengthening;Both;2 sets;10 reps;Knee straight    Hip Extension Stengthening;Both;2 sets;10 reps;Knee straight    Extension Limitations on airex      Knee/Hip Exercises: Seated   Long Arc Quad Strengthening;Left;2 sets;15 reps;Weights    Long Arc Quad Weight 2 lbs.      Manual Therapy   Manual Therapy Joint mobilization    Joint Mobilization Joint mobilizations of tibia on femur AP and PA grades I-IV and patellar glides in all planes    Passive ROM Knee FL/EXT PROM                  PT Education - 01/01/21 1931    Education Details Upon arrival, she states she may have questions regarding HEP but could not think of any when asked. Reviewed HEP and rationale for exercises and how interventions and stair training allow for improved safety and functional strength.    Person(s) Educated Patient    Methods Explanation;Demonstration;Verbal cues;Handout    Comprehension Verbalized understanding;Returned demonstration;Need further instruction            PT Short Term Goals - 01/01/21 1545      PT SHORT TERM GOAL #1   Title ind with initial HEP    Time 4    Period Weeks    Status Achieved    Target Date 12/10/20             PT Long Term Goals - 01/01/21 1932      PT LONG TERM GOAL #1   Title decreased pain in left knee by >= 75% with sitting and walking.    Time 8    Period Weeks    Status On-going      PT LONG TERM GOAL #2   Title Improved left knee flexion to 5-110 deg or greater to normalize functional mobility.    Baseline bony endfeel with PROM and assessment    Time 8    Period Weeks    Status Not Met      PT LONG TERM GOAL #3   Title pt able to demo improved BLE strength to allow reciprocal gait on stairs.    Time 8    Period Weeks    Status On-going      PT LONG TERM GOAL #4   Title Independent in HEP     Time 8  Period Weeks    Status On-going                 Plan - 01/01/21 1545    Clinical Impression Statement Patient  tolerated session well today with no adverse effects or increased L knee pain. She was able to negotiate 4" steps several times but demonstrates hesitancy when attempting reciprocal step pattern and presents with poor L eccentric quad control when descending steps. Upon arrival, she states she may have questions regarding HEP but could not think of any when asked.    Personal Factors and Comorbidities Comorbidity 3+;Past/Current Experience    Comorbidities Breast CA, DM, psoriatic arthritis    Examination-Activity Limitations Sit;Stairs    Stability/Clinical Decision Making Stable/Uncomplicated    Rehab Potential Excellent    PT Frequency 2x / week    PT Duration 8 weeks    PT Treatment/Interventions ADLs/Self Care Home Management;Iontophoresis 36m/ml Dexamethasone;Stair training;Therapeutic activities;Therapeutic exercise;Neuromuscular re-education;Manual techniques;Patient/family education;Dry needling;Taping    PT Next Visit Plan Re-evaluatoin and FOTO. TPDN as needed, progress hip/knee strengthening and flexibility as tolerated. progress standing activity, gait training. Stair training/step ups with focus on L quad eccentric control. VMO activation.    PT Home Exercise Plan [id number]    Consulted and Agree with Plan of Care Patient           Patient will benefit from skilled therapeutic intervention in order to improve the following deficits and impairments:  Abnormal gait,Decreased range of motion,Pain,Impaired flexibility,Decreased strength,Postural dysfunction  Visit Diagnosis: Stiffness of left knee, not elsewhere classified  Chronic pain of left knee     Problem List Patient Active Problem List   Diagnosis Date Noted  . Hypertrophic cardiomyopathy ([facility] 10/04/2017  . Acute on chronic diastolic CHF (congestive heart failure) ([place] 10/04/2017  . 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 Psoriatic arthritis ([facility] 07/31/2015  . Vitamin D deficiency 04/13/2015  . Mood changes 03/16/2015   . 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 Bilateral lower extremity edema 09/15/2014  . Malfunction of device 08/28/2014  . Laryngitis 08/18/2014  . Type II diabetes mellitus with manifestations ([place] 08/04/2014  . Peripheral neuropathy due to chemotherapy ([facility] 08/04/2014  . Diarrhea 08/04/2014  . Fatigue due to treatment 08/04/2014  . Rectal bleed 06/30/2014  . Post-operative state 04/24/2014  . Breast cancer of upper-outer quadrant of right female breast ([place] 03/03/2014      KHaydee Jackson PT, DPT 01/01/21 7:39 PM  [place] [address]Phone: [phone]  Fax:  [phone] Name: BKAMRON PORTEEMRN: [id number] of Birth: [birth year]

## 2021-01-03 ENCOUNTER — Ambulatory Visit: Payer: No Typology Code available for payment source

## 2021-01-03 ENCOUNTER — Other Ambulatory Visit: Payer: Self-pay

## 2021-01-03 DIAGNOSIS — M25662 Stiffness of left knee, not elsewhere classified: Secondary | ICD-10-CM

## 2021-01-03 DIAGNOSIS — M25562 Pain in left knee: Secondary | ICD-10-CM

## 2021-01-03 DIAGNOSIS — G8929 Other chronic pain: Secondary | ICD-10-CM

## 2021-01-03 NOTE — Therapy (Signed)
[place], [place], [address] Phone: [phone]   Fax:  [phone]  Physical Therapy Treatment/Re-certification  Patient Details  Name: Brandy Jackson MRN: [id number] Date of Birth: 27-Sep-1951 Referring Provider (PT): Lottie Dawson, MD (Resident); Melynda Keller FNP (PCP in Epic)   Encounter Date: 01/03/2021   PT End of Session - 01/03/21 1517    Visit Number 7    Number of Visits 15    Date for PT Re-Evaluation 02/02/21    Authorization Type VA Choice    Authorization - Visit Number 7    Authorization - Number of Visits 15    PT Start Time 1500    PT Stop Time 1545    PT Time Calculation (min) 45 min    Activity Tolerance Patient tolerated treatment well    Behavior During Therapy WFL for tasks assessed/performed           Past Medical History:  Diagnosis Date  . Breast cancer ([place])   . Cancer ([place])   . Diabetes mellitus without complication ([place])   . Hot flashes   . Psoriatic arthritis ([place])   . 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 Wears glasses   . Wears partial dentures    bottom    Past Surgical History:  Procedure Laterality Date  . BLADDER REPAIR W/ CESAREAN SECTION    . COLONOSCOPY    . PORTACATH PLACEMENT N/A 04/11/2014   Procedure: INSERTION PORT-A-CATH;  Surgeon: Joyice Faster. Cornett, MD;  Location: [facility];  Service: General;  Laterality: N/A;  . RE-EXCISION OF BREAST LUMPECTOMY Right 06/06/2014   Procedure: RE-EXCISION OF BREAST LUMPECTOMY;  Surgeon: Marcello Moores A. Cornett, MD;  Location: [facility];  Service: General;  Laterality: Right;    There were no vitals filed for this visit.   Subjective Assessment - 01/03/21 1504    Subjective Patient reports her knee is much better since starting PT. Patient report her main concern was her knee and "it is doing better." She reports the dry needling helped immensely.     Pertinent History Breast CA, DM, psoriatic arthritis    How long can you sit comfortably? 30 minutes then I have to get up and move around    How long can you stand comfortably? standing doesn't bother me at all    How long can you walk comfortably? I walk for 30 minutes a day and I can tolerate it.    Patient Stated Goals "I want to be able to run, and I want to be able to sit down on the floor with my legs crossed"    Currently in Pain? Yes    Pain Score 6     Pain Location Knee    Pain Orientation Left    Pain Descriptors / Indicators Sore    Pain Type Chronic pain    Pain Onset More than a month ago    Pain Frequency Intermittent    Aggravating Factors  prolonged sitting    Multiple Pain Sites No              [facility] PT Assessment - 01/03/21 0001      Observation/Other Assessments   Focus on Therapeutic Outcomes (FOTO)  46% function      Posture/Postural Control   Posture Comments genu valgum LLE, foot ER      AROM   Left Knee Extension -14    Left Knee Flexion 100  PROM   Overall PROM Comments bony end feel    Left Knee Extension -14    Left Knee Flexion 100      Strength   Right Hip Flexion 5/5    Right Hip ABduction 4+/5    Left Hip Extension 4/5    Left Hip ABduction 4/5    Right Knee Flexion 5/5    Right Knee Extension 5/5    Left Knee Flexion 4/5    Left Knee Extension 4+/5   within available ROM   Right Ankle Dorsiflexion 5/5    Right Ankle Plantar Flexion 5/5    Left Ankle Dorsiflexion 5/5    Left Ankle Plantar Flexion 5/5                         [facility] Adult PT Treatment/Exercise - 01/03/21 0001      Self-Care   Other Self-Care Comments  see patient education      Knee/Hip Exercises: Stretches   Passive Hamstring Stretch 60 seconds      Knee/Hip Exercises: Standing   Forward Step Up Limitations 4 inch 1 x 10 each    Other Standing Knee Exercises sit <> stand 2 x 5      Knee/Hip Exercises: Supine   Bridges Limitations 2 x  10      Knee/Hip Exercises: Sidelying   Hip ABduction Limitations 2 x 10                  PT Education - 01/03/21 1552    Education Details Education on overall progress and POC moving forward.            PT Short Term Goals - 01/01/21 1545      PT SHORT TERM GOAL #1   Title ind with initial HEP    Time 4    Period Weeks    Status Achieved    Target Date 12/10/20             PT Long Term Goals - 01/03/21 1508      PT LONG TERM GOAL #1   Title decreased pain in left knee by >= 75% with sitting and walking.    Baseline reports pain can increase to 8/10 with prolonged sitting 01/03/21    Time 8    Period Weeks    Status On-going      PT LONG TERM GOAL #2   Title Improved left knee flexion to 5-110 deg or greater to normalize functional mobility.    Baseline bony endfeel with PROM and assessment    Time 8    Period Weeks    Status Not Met      PT LONG TERM GOAL #3   Title pt able to demo improved BLE strength to allow reciprocal gait on stairs.    Time 8    Period Weeks    Status Partially Met      PT LONG TERM GOAL #4   Title Independent in HEP     Time 8    Period Weeks    Status On-going                 Plan - 01/03/21 1522    Clinical Impression Statement Patient has attended 7 PT sessions since start of care on 11/12/20. She reports overall her Lt knee is feeling better since initiating PT despite a regression in her FOTO score from baseline. Her knee flexion AROM has modestly improved  and her knee exntension AROM remains limited with bony end feel. Her Lt knee strength has improved, though she has lingering bilateral hip weakness. Though her knee flexion and extension ROM will likely remained limited as noted through bony end feel she would benefit from continued care to address lingering strength deficits in order to improve overall safety and functional strength.    Personal Factors and Comorbidities Comorbidity 3+;Past/Current Experience     Comorbidities Breast CA, DM, psoriatic arthritis    Examination-Activity Limitations Sit;Stairs    Stability/Clinical Decision Making Stable/Uncomplicated    Rehab Potential Excellent    PT Frequency 2x / week    PT Duration 4 weeks    PT Treatment/Interventions ADLs/Self Care Home Management;Iontophoresis 4mg /ml Dexamethasone;Stair training;Therapeutic activities;Therapeutic exercise;Neuromuscular re-education;Manual techniques;Patient/family education;Dry needling;Taping    PT Next Visit Plan Hip and knee strengthening, consider TKE, knee eccentric exercises, consider leg press. update HEP.    PT Home Exercise Plan [id number]    Consulted and Agree with Plan of Care Patient           Patient will benefit from skilled therapeutic intervention in order to improve the following deficits and impairments:  Abnormal gait,Decreased range of motion,Pain,Impaired flexibility,Decreased strength,Postural dysfunction  Visit Diagnosis: Stiffness of left knee, not elsewhere classified  Chronic pain of left knee     Problem List Patient Active Problem List   Diagnosis Date Noted  . Hypertrophic cardiomyopathy ([place]) 10/04/2017  . Acute on chronic diastolic CHF (congestive heart failure) ([place]) 10/04/2017  . 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 Psoriatic arthritis ([place]) 07/31/2015  . Vitamin D deficiency 04/13/2015  . Mood changes 03/16/2015  . 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 Bilateral lower extremity edema 09/15/2014  . Malfunction of device 08/28/2014  . Laryngitis 08/18/2014  . Type II diabetes mellitus with manifestations ([place]) 08/04/2014  . Peripheral neuropathy due to chemotherapy ([place]) 08/04/2014  . Diarrhea 08/04/2014  . Fatigue due to treatment 08/04/2014  . Rectal bleed 06/30/2014  . Post-operative state 04/24/2014  . Breast cancer of upper-outer quadrant of right female breast ([place]) 03/03/2014  Gwendolyn Grant, PT, DPT, ATC 01/03/21 5:45 PM  [place], [place], [address] Phone: [phone]   Fax:  [phone]  Name: Brandy Jackson MRN: [id number] Date of Birth: 03-03-1951

## 2021-01-04 ENCOUNTER — Encounter: Payer: Self-pay | Admitting: Psychology

## 2021-01-04 ENCOUNTER — Ambulatory Visit (INDEPENDENT_AMBULATORY_CARE_PROVIDER_SITE_OTHER): Payer: No Typology Code available for payment source | Admitting: Psychology

## 2021-01-04 ENCOUNTER — Ambulatory Visit: Payer: Non-veteran care | Admitting: Psychology

## 2021-01-04 DIAGNOSIS — G3184 Mild cognitive impairment, so stated: Secondary | ICD-10-CM | POA: Diagnosis not present

## 2021-01-04 DIAGNOSIS — B354 Tinea corporis: Secondary | ICD-10-CM | POA: Insufficient documentation

## 2021-01-04 DIAGNOSIS — R4189 Other symptoms and signs involving cognitive functions and awareness: Secondary | ICD-10-CM

## 2021-01-04 HISTORY — DX: Mild cognitive impairment of uncertain or unknown etiology: G31.84

## 2021-01-04 NOTE — Progress Notes (Signed)
   Psychometrician Note   Cognitive testing was administered to Brandy Jackson by Brandy Jackson, B.S. (psychometrist) under the supervision of Dr. Christia Reading, Ph.D., licensed psychologist on 01/04/21. Brandy Jackson did not appear overtly distressed by the testing session per behavioral observation or responses across self-report questionnaires. Dr. Christia Reading, Ph.D. checked in with Brandy Jackson as needed to manage any distress related to testing procedures (if applicable). Rest breaks were offered.    The battery of tests administered was selected by Dr. Christia Reading, Ph.D. with consideration to Brandy Jackson's current level of functioning, the nature of her symptoms, emotional and behavioral responses during interview, level of literacy, observed level of motivation/effort, and the nature of the referral question. This battery was communicated to the psychometrist. Communication between Dr. Christia Reading, Ph.D. and the psychometrist was ongoing throughout the evaluation and Dr. Christia Reading, Ph.D. was immediately accessible at all times. Dr. Christia Reading, Ph.D. provided supervision to the psychometrist on the date of this service to the extent necessary to assure the quality of all services provided.    Brandy Jackson will return within approximately 1-2 weeks for an interactive feedback session with Dr. Melvyn Novas at which time her test performances, clinical impressions, and treatment recommendations will be reviewed in detail. Brandy Jackson understands she can contact our office should she require our assistance before this time.  A total of 150 minutes of billable time were spent face-to-face with Brandy Jackson by the psychometrist. This includes both test administration and scoring time. Billing for these services is reflected in the clinical report generated by Dr. Christia Reading, Ph.D..  This note reflects time spent with the psychometrician and does not include test scores or any  clinical interpretations made by Dr. Melvyn Novas. The full report will follow in a separate note.

## 2021-01-04 NOTE — Progress Notes (Signed)
NEUROPSYCHOLOGICAL EVALUATION Brandy Jackson. [facility] Department of Neurology  Date of Evaluation: January 04, 2021  Reason for Referral:   Brandy Jackson is a 70 y.o. right-handed African-American female referred by Alonza Bogus, D.O., to characterize her current cognitive functioning and assist with diagnostic clarity and treatment planning in the context of ongoing tremor and concerns for cognitive decline.   Assessment and Plan:   Clinical Impression(s): Brandy Jackson pattern of performance is suggestive of fairly diffuse cognitive decline with a majority of performances falling in the well below average to below average normative ranges relative to age-matched peers. Outside of strong performance across a task assessing safety and judgment, no consistent strengths emerged. Her most significant weaknesses surrounded processing speed, attention/concentration, and executive functioning and she would perhaps best be classified as having moderate-severe frontal-subcortical dysfunction. Brandy Jackson largely denied difficulties completing instrumental activities of daily living (ADLs) independently. As she was unaccompanied, I do not have collateral information to confirm this. However, given evidence for cognitive decline described above, she meets criteria for a Mild Neurocognitive Disorder ("mild cognitive impairment") and likely towards the middle of this spectrum presently.  The etiology of her cognitive decline is unclear. Frontal-subcortical dysfunction is common in primary vascular etiologies, as well as Parkinson's disease and other neurological conditions within this spectrum. However, regarding the former, her most recent brain MRI was said to exhibit mild chronic small vessel disease, likely not a significant enough amount to warrant such widespread decline. Regarding the latter, Lewy body dementia should remain on her differential as this represents the second most  common form of neurodegenerative illness. Brandy Jackson did exhibit tremors primarily in her left hand, as well as some gait abnormalities. There was also reason to be concerned for fluctuations in alertness throughout testing. However, there remains a significant chance that gait abnormalities are due to a longstanding knee deformity. Brandy Jackson did not report ongoing visual hallucinations or REM sleep behaviors which are commonly seen in Lewy body dementia and while frontal-subcortical dysfunction does align with this condition, I would expect visuospatial functioning to be worse than current performances. However, in lieu of this, continued consideration is warranted. Dr. Carles Collet expressed confidence that Brandy Jackson does not have Parkinson's disease. I did not observe gaze palsy and Brandy Jackson denied frequent falls, making progressive supranuclear palsy less likely. She also did not endorse myoclonus, alien-limb phenomenons, or other symptoms concerning for corticobasal degeneration outside of the potential for some asymmetric motor dysfunction. Memory performances did not suggest a memory storage deficit or rapid forgetting and are not consistent with Alzheimer's disease. Finally, no personality changes or behavioral disinhibition was noted, making frontotemporal dementia very unlikely.   Brandy Jackson does have a history of undergoing chemotherapy and radiation treatment following breast cancer and expressed concerns that memory dysfunction was due to this treatment. Her timeline of events, however, is difficult to follow as Brandy Jackson reported that cognitive dysfunction has been present for the past 2-3 months (she later said 7 months), but records suggest that her bout with breast cancer and subsequent treatment concluded in 2015. Overall, while cognitive deficits can certainly persist in the months to years following cancer-related treatments, this impact is generally mild and not as widespread as what was  revealed during the current evaluation. Overall, I have concerns surrounding an unspecific neurodegenerative process which may or may not be exacerbated by her history of chemotherapy/radiation. Continued medical monitoring will be important moving forward.   Recommendations: A repeat neuropsychological  evaluation in 12-18 months (or sooner if functional decline is noted) is recommended to assess the trajectory of future cognitive decline should it occur. This will also aid in future efforts towards improved diagnostic clarity.  I would recommend that Brandy Jackson complete a DaTscan in order to try and obtain greater diagnostic clarity. She should discuss the pros and cons of this form of neuroimaging with Dr. Carles Collet.   Brandy Jackson reported acutely poor sleep with significant symptoms of insomnia occurring during the past month. I would encourage her to discuss these symptoms with her PCP and consider medication options to help her fall asleep and remain asleep throughout the night.   Should there be a progression of her current deficits over time, Brandy Jackson is unlikely to regain any independent living skills lost. Therefore, it is recommended that she remain as involved as possible in all aspects of household chores, finances, and medication management, with supervision to ensure adequate performance. She will likely benefit from the establishment and maintenance of a routine in order to maximize her functional abilities over time.  It will be important for Brandy Jackson to have another person with her when in situations where she may need to process information, weigh the pros and cons of different options, and make decisions, in order to ensure that she fully understands and recalls all information to be considered.  If not already done, Brandy Jackson and her family may want to discuss her wishes regarding durable power of attorney and medical decision making, so that she can have input into these choices.  Additionally, they may wish to discuss future plans for caretaking and seek out community options for in home/residential care should they become necessary.   Performance across neurocognitive testing is not a strong predictor of an individual's safety operating a motor vehicle. Should her family wish to pursue a formalized driving evaluation, they would be encouraged to contact [employer] in [place], [place] at [phone]. Another option would be through [facility]; however, the latter would likely require a referral from a medical doctor. [facility] can be reached directly at [phone].   When learning new information, she would benefit from information being broken up into small, manageable pieces. She may also find it helpful to articulate the material in her own words and in a context to promote encoding at the onset of a new task. This material may need to be repeated multiple times to promote encoding.  Memory can be improved using internal strategies such as rehearsal, repetition, chunking, mnemonics, association, and imagery. External strategies such as written notes in a consistently used memory journal, visual and nonverbal auditory cues such as a calendar on the refrigerator or appointments with alarm, such as on a cell phone, can also help maximize recall.    To address problems with processing speed, she may wish to consider:   -Ensuring that she is alerted when essential material or instructions are being presented   -Adjusting the speed at which new information is presented   -Allowing for more time in comprehending, processing, and responding in conversation  To address problems with fluctuating attention, she may wish to consider:   -Avoiding external distractions when needing to concentrate   -Limiting exposure to fast paced environments with multiple sensory demands   -Writing down complicated information and using checklists   -Attempting and  completing one task at a time (i.e., no multi-tasking)   -Verbalizing aloud each step of a task to maintain  focus   -Reducing the amount of information considered at one time  Review of Records:   Ms. Swingler was seen by [facility] Wells Guiles Tat, D.O.) on 11/26/2020 for an evaluation of tremor and memory changes. At that time, Ms. Harold noted that tremors had been ongoing for the past 2-3 months, primarily affecting her left hand. These symptoms were not said to be impacted by caffeine, stress, or fatigue and generally don't impact ADLs. Hypophonia, micrographia, REM sleep behaviors, vivid dreaming, and hallucinations were denied. She was said to exhibit a shuffling gait and difficulty getting out of her chair. She also drags her left foot. Gait abnormalities were said to likely be at least partially attributed to a significant vulgus deformity of the knees with associated knee pain. Ms. Mashaw is actively involved in outpatient PT services. Upon her evaluation, Dr. Carles Collet reported seeing no evidence for Parkinson's disease. Ms. Kloeppel further described ongoing memory concerns, as did a prior ST evaluation (I was unable to locate these records). Ultimately, Ms. Samonte was referred for a comprehensive neuropsychological evaluation to characterize her cognitive abilities and to assist with diagnostic clarity and treatment planning.   Brain MRI on 06/26/2020 revealed mild chronic small vessel disease and moderate atrophy. Brain MRA was unremarkable.   Past Medical History:  Diagnosis Date  . Acute on chronic diastolic CHF (congestive heart failure) 10/04/2017  . 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 Bilateral lower extremity edema 09/15/2014  . Breast cancer of upper-outer quadrant of right female breast 03/03/2014   ER/PR+ Her2+ Right IDC; completed chemotherapy and radiation treatment  . Hot flashes   . 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 Laryngitis 08/18/2014  . Latent  tuberculosis 10/04/2017  . 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 Psoriatic arthritis 07/31/2015  . 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 Rectal bleed 06/30/2014  . Rheumatoid arthritis 08/14/2016  . Tinea corporis   . Tremor    primarily left hand  . Type II diabetes mellitus with manifestations 08/04/2014  . Vitamin D deficiency 04/13/2015  . Wears glasses   . Wears partial dentures    bottom    Past Surgical History:  Procedure Laterality Date  . BLADDER REPAIR W/ CESAREAN SECTION    . COLONOSCOPY    . PORTACATH PLACEMENT N/A 04/11/2014   Procedure: INSERTION PORT-A-CATH;  Surgeon: Joyice Faster. Cornett, MD;  Location: [facility];  Service: General;  Laterality: N/A;  . RE-EXCISION OF BREAST LUMPECTOMY Right 06/06/2014   Procedure: RE-EXCISION OF BREAST LUMPECTOMY;  Surgeon: Marcello Moores A. Cornett, MD;  Location: [facility];  Service: General;  Laterality: Right;    Current Outpatient Medications:  .  calcipotriene (DOVONOX) 0.005 % cream, Apply 1 application topically 2 (two) times daily as needed (itching from psoriasis). , Disp: , Rfl:  .  cholecalciferol (VITAMIN D) 1000 UNITS tablet, Take 1,000 Units by mouth daily., Disp: , Rfl:  .  clobetasol (TEMOVATE) 0.05 % external solution, Apply 1 application topically 2 (two) times daily. (Patient taking differently: Apply 1 application topically 2 (two) times daily as needed (itching from psoriasis).), Disp: 50 mL, Rfl: 0 .  Cyanocobalamin (VITAMIN B-12 PO), Take 1 tablet by mouth daily., Disp: , Rfl:  .  cyclobenzaprine (FLEXERIL) 10 MG tablet, Take 10 mg by mouth every 8 (eight) hours as needed for muscle spasms., Disp: , Rfl:  .  diclofenac sodium (VOLTAREN) 1 % GEL, Apply 1 application topically 2 (two) times daily as needed (neck  pain)., Disp: , Rfl:  .  ferrous sulfate 324 (65 Fe) MG TBEC, Take 1 tablet by mouth daily., Disp: , Rfl:  .  folic acid (FOLVITE) 1 MG tablet,  Take 1 mg by mouth See admin instructions. Take 1 tablet (1 mg) by mouth every day except Saturday (do not take on the same day as methotrexate), Disp: , Rfl:  .  HYDROcodone-homatropine (HYCODAN) 5-1.5 MG/5ML syrup, hydrocodone-homatropine 5 mg-1.5 mg/5 mL oral syrup (Patient not taking: Reported on 11/26/2020), Disp: , Rfl:  .  ketoconazole (NIZORAL) 2 % cream, Apply 1 application topically daily. (Patient taking differently: Apply 1 application topically daily as needed (rash under breasts).), Disp: 30 g, Rfl: 0 .  losartan (COZAAR) 25 MG tablet, Take 25 mg by mouth daily., Disp: , Rfl:  .  metFORMIN (GLUCOPHAGE) 500 MG tablet, Take 500 mg by mouth daily., Disp: , Rfl:  .  methotrexate (RHEUMATREX) 2.5 MG tablet, Take 10 mg by mouth 2 (two) times a week. Caution:Chemotherapy. Protect from light  Saturday, Disp: , Rfl:  .  METHOTREXATE PO, Take 4 tablets by mouth See admin instructions. Take 4 tablets by mouth Saturday morning and take 4 tablets Saturday night, Disp: , Rfl:  .  metoprolol tartrate (LOPRESSOR) 25 MG tablet, Take 0.5 tablets (12.5 mg total) by mouth 2 (two) times daily., Disp: 30 tablet, Rfl: 0 .  naproxen (NAPROSYN) 500 MG tablet, Take 500 mg by mouth daily as needed (pain)., Disp: , Rfl:  .  Omega-3 Fatty Acids (FISH OIL) 1000 MG CAPS, Take 1,000 mg by mouth daily., Disp: , Rfl:  .  simvastatin (ZOCOR) 20 MG tablet, Take 10 mg by mouth daily., Disp: , Rfl:   Clinical Interview:   The following information was obtained during a clinical interview with Ms. Koike prior to cognitive testing.  Cognitive Symptoms: Decreased short-term memory: Endorsed. She described largely generalized difficulties surrounding short-term memory dysfunction. When asked direct questions, she reported trouble misplacing things around her home and recalling the details of previous conversations. She also noted forgetting how to operate her smart phone and remote control at times. She denied trouble  remembering names of familiar individuals or upcoming appointments. Deficits were said to be present for the past couple of months and have largely seemed stable.  Decreased long-term memory: Denied. Decreased attention/concentration: Endorsed. She reported "maybe" experiencing trouble with sustained attention. She endorsed more significant trouble with increased distractibility.  Reduced processing speed: Unclear.  Difficulties with executive functions: Endorsed. She reported ongoing trouble with organization and indecision. She denied making any recent unsafe judgments, nor exhibiting increased impulsivity. Overt personality changes were also denied.  Difficulties with emotion regulation: Denied. Difficulties with receptive language: Denied. Difficulties with word finding: Endorsed "sometimes." Decreased visuoperceptual ability: Denied.  Trajectory of deficits: Ms. Bellows was somewhat unclear on her timeline of cognitive deficits. She initially stated that short-term memory dysfunction had been ongoing for a couple of months. She later said that more broad cognitive dysfunction may have become more noticeable following the completion of chemotherapy/radiation treatment which occurred approximately seven months prior. However, records suggest that radiation treatments ended in December 2015. Overall, the timeline of dysfunction is difficult to determine.   Difficulties completing ADLs: Denied.  Additional Medical History: History of traumatic brain injury/concussion: Denied. History of stroke: Denied. History of seizure activity: Denied. History of known exposure to toxins: Denied. Symptoms of chronic pain: Endorsed. She reported ongoing chronic knee pain, especially on he left side.  Experience of frequent headaches/migraines: Denied.  Frequent instances of dizziness/vertigo: Denied.  Sensory changes: She wears glasses and hearing aids with positive effect. Other sensory changes/difficulties  (e.g., taste or smell) were denied.  Balance/coordination difficulties: Endorsed. She reported ongoing balance concerns which affect the left side worse than the right. These were partially attributed to ongoing left knee pain and left-sided tremors. There is also a history of a significant vulgus deformity of the knees which likely impacts balance/mobility. She denied any recent falls. She is actively involved in outpatient PT services. She commented that her therapist has told her that her left side is notably weaker than her right.  Other motor difficulties: Endorsed. She reported ongoing tremors, primarily in her left hand. However, she did allude to mild symptoms sometimes affecting her right hand, as well as her left leg.   Other medical conditions: Medical records suggest a history of breast cancer diagnosed in 2015. Ms. Royle reported undergoing both chemotherapy and radiation treatment, with treatment being completed approximately seven months prior. However, prior medical records may suggest that radiation treatment ended in December 2015. I was unable to find records suggesting more recent treatment.   Sleep History: Estimated hours obtained each night: Unclear. She reported obtaining about 2-3 hours of sleep during the previous night. She was unable to state if this was typical for her or not.  Difficulties falling asleep: Endorsed. She reported what appear to be significant symptoms of insomnia, stating that she will sometimes be unable to fall asleep until about 4:00am. This was said to be a somewhat recent change, occurring throughout the past month.  Difficulties staying asleep: Denied. Feels rested and refreshed upon awakening: Endorsed.  History of snoring: Endorsed. History of waking up gasping for air: Denied. Witnessed breath cessation while asleep: Denied.  History of vivid dreaming: Denied. Excessive movement while asleep: Denied. Instances of acting out her dreams:  Denied.  Psychiatric/Behavioral Health History: Depression: When asked about current mood, she stated "I try to stay in a good mood" and that she is generally successful in these efforts. She denied to her knowledge ever being previously diagnosed with any mental health conditions surrounding depression or anxiety. She denied previously working with a Administrator, [employer]. Current or remote suicidal ideation, intent, or plan was likewise denied.  Anxiety: Denied. Mania: Denied. Trauma History: Denied. Visual/auditory hallucinations: Denied. Delusional thoughts: Denied.  Tobacco: Denied. Alcohol: She denied current alcohol consumption as well as a history of problematic alcohol abuse or dependence.  Recreational drugs: Denied. Caffeine: One cup of coffee in the morning.   Family History: Problem Relation Age of Onset  . Diabetes Mother   . Memory loss Mother        possible undiagnosed Alzheimer's disease  . Heart disease Father   . Congestive Heart Failure Sister   . Congestive Heart Failure Sister   . Healthy Son   . Healthy Son   . Healthy Son    This information was confirmed by Ms. Gieske.  Academic/Vocational History: Highest level of educational attainment: 16 years. She graduated from high school and earned a Dietitian in elementary education. She described herself as a typically strong (A/B) student in academic settings. Math was noted as a potential relative weakness.  History of developmental delay: Denied. History of grade repetition: Denied. Enrollment in special education courses: Denied. History of LD/ADHD: Denied.  Employment: Retired. 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   Evaluation Results:   Behavioral Observations: Ms. Mcintire was unaccompanied, arrived to her appointment on  time, and was appropriately dressed and groomed. She appeared alert and oriented. She exhibited a slowed and slightly unstable gait. She  did utilize a cane well and did not have any episodes of instability while walking to and from the examination room. A mild tremor was observed in her left hand upon her raising her hand to demonstrate this. Her affect was generally relaxed and positive, but did range appropriately given the subject being discussed during the clinical interview or the task at hand during testing procedures. She was soft-spoken and exhibited a slight response delay when answering interview questions. However, spontaneous speech was fluent and word finding difficulties were not observed during the clinical interview. Thought processes were coherent, organized, and normal in content. Insight into her cognitive difficulties appeared somewhat limited in that deficits across testing were far more widespread than she reported during interview.   During testing, no tremulous behaviors were observed. She did appear to have some fluctuations in alertness where numerous prompts were required in order for her to answer questions across several tasks. For example, across a figure copy task, she had to be prompted a total of three times to draw the figure. Following the third prompt, she responded with "oh, you want me to draw this?" A noted response latency was exhibited throughout testing. She had trouble comprehending task instructions across more complex tasks (TMT B, D-KEFS Color Word). About two-thirds of the way through the evaluation, she expressed a desire to stop. A few more tasks were completed but the evaluation was mildly abbreviated in response. Task engagement was generally adequate and she did appear to persist when challenged. Overall, Ms. Aki was cooperative with the clinical interview and subsequent testing procedures.   Adequacy of Effort: The validity of neuropsychological testing is limited by the extent to which the individual being tested may be assumed to have exerted adequate effort during testing. Ms. Killingsworth  expressed her intention to perform to the best of her abilities and exhibited adequate task engagement and persistence. Scores across stand-alone and embedded performance validity measures were variable. While I believe that this is due to true cognitive dysfunction and that current results represent a valid representation of Ms. Galbreath's current cognitive functioning, a mild degree if caution is prudent when interpreting scores.   Test Results: Ms. Sochacki was largely oriented at the time of the current evaluation.  Intellectual abilities based upon educational and vocational attainment were estimated to be in the average range. Premorbid abilities were estimated to be within the above average range based upon a single-word reading test.   Processing speed was mildly variable but largely exceptionally low. Basic attention was well below average. More complex attention (e.g., working memory) was exceptionally low to below average. Executive functioning was exceptionally low. She did perform in the average range across a task assessing safety and judgment.   Assessed receptive language abilities were exceptionally low. She appeared to exhibit greatest difficulty understanding complex sentence structure and following multi-step commands. Assessed expressive language was mildly variable. Phonemic fluency was below average, semantic fluency was well below average to below average, and confrontation naming was average across a screening instrument but well below average across a more comprehensive assessment.      Assessed visuospatial/visuoconstructional abilities were variable, ranging from the well below average to average normative ranges. Points were lost on her drawing of a clock due to only one hand being represented.    Learning (i.e., encoding) of novel verbal information was well below average to  below average. Spontaneous delayed recall (i.e., retrieval) of previously learned information was also  well below average to below average. Retention rates were 67% across a story learning task, 43% across a list learning task, and 47% across a figure drawing task. Performance across recognition tasks was below average to average, suggesting evidence for information consolidation.   Results of emotional screening instruments suggested that recent symptoms of generalized anxiety were in the minimal range, while symptoms of depression were within normal limits. A screening instrument assessing recent sleep quality suggested the presence of mild sleep dysfunction.  Tables of Scores:   Note: This summary of test scores accompanies the interpretive report and should not be considered in isolation without reference to the appropriate sections in the text. Descriptors are based on appropriate normative data and may be adjusted based on clinical judgment. The terms "impaired" and "within normal limits (WNL)" are used when a more specific level of functioning cannot be determined.       Effort Testing:   DESCRIPTOR       ACS Word Choice: --- --- Below Expectation  Dot Counting Test: --- --- Within Expectation  RBANS Effort Index: --- --- Within Expectation  WAIS-IV Reliable Digit Span: --- --- Below Expectation       Orientation:      Raw Score Percentile   NAB Orientation, Form 1 28/29 --- ---       Cognitive Screening:           Raw Score Percentile   SLUMS: 19/30 --- ---       RBANS, Form A: Standard Score/ Scaled Score Percentile   Total Score 72 3 Well Below Average  Immediate Memory 73 4 Well Below Average    List Learning 6 9 Below Average    Story Memory 4 2 Well Below Average  Visuospatial/Constructional 87 19 Below Average    Figure Copy 11 63 Average    Line Orientation 10/20 3-9 Well Below Average  Language 85 16 Below Average    Picture Naming 10/10 51-75 Average    Semantic Fluency 4 2 Well Below Average  Attention 53 <1 Exceptionally Low    Digit Span 4 2 Well Below Average     Coding 2 <1 Exceptionally Low  Delayed Memory 92 30 Average    List Recall 3/10 10-16 Below Average    List Recognition 20/20 51-75 Average    Story Recall 4 2 Well Below Average    Story Recognition 8/12 8-15 Below Average    Figure Recall 7 16 Below Average    Figure Recognition 4/8 9-20 Below Average       Intellectual Functioning:           Standard Score Percentile   Test of Premorbid Functioning: 110 75 Above Average       Attention/Executive Function:          Trail Making Test (TMT): Raw Score (T Score) Percentile     Part A 77 secs.,  0 errors (29) 2 Exceptionally Low    Part B Discontinued --- Impaired         Scaled Score Percentile   WAIS-IV Digit Span: 3 1 Exceptionally Low    Forward 5 5 Well Below Average    Backward 6 9 Below Average    Sequencing 3 1 Exceptionally Low        Scaled Score Percentile   WAIS-IV Similarities: 7 16 Below Average       D-KEFS Color-Word Interference Test:  Raw Score (Scaled Score) Percentile     Color Naming 60 secs. (1) <1 Exceptionally Low    Word Reading 30 secs. (7) 16 Below Average    Inhibition 180 secs. (1) <1 Exceptionally Low      Total Errors 19 errors (1) <1 Exceptionally Low    Inhibition/Switching Discontinued --- Impaired      Total Errors --- --- ---       D-KEFS Verbal Fluency Test: Raw Score (Scaled Score) Percentile     Letter Total Correct 22 (6) 9 Below Average    Category Total Correct 21 (4) 2 Well Below Average    Category Switching Total Correct 3 (1) <1 Exceptionally Low    Category Switching Accuracy 2 (1) <1 Exceptionally Low      Total Set Loss Errors 3 (9) 37 Average      Total Repetition Errors 2 (11) 63 Average       NAB Executive Functions Module, Form 1: T Score Percentile     Judgment 52 58 Average       Language:          Verbal Fluency Test: Raw Score (T Score) Percentile     Phonemic Fluency (FAS) 22 (37) 9 Below Average    Animal Fluency 12 (40) 16 Below Average        NAB  Language Module, Form 1: T Score Percentile     Auditory Comprehension 25 1 Exceptionally Low    Naming 28/31 (35) 7 Well Below Average       Visuospatial/Visuoconstruction:      Raw Score Percentile   Clock Drawing: 7/10 --- Within Normal Limits       NAB Spatial Module, Form 1: T Score Percentile     Visual Discrimination 31 3 Well Below Average         Scaled Score Percentile   WAIS-IV Block Design: 6 9 Below Average       Mood and Personality:      Raw Score Percentile   Beck Depression Inventory - II: 5 --- Within Normal Limits  PROMIS Anxiety Questionnaire: 7 --- None to Slight       Additional Questionnaires:      Raw Score Percentile   PROMIS Sleep Disturbance Questionnaire: 27 --- Mild   Informed Consent and Coding/Compliance:   Ms. Henigan was provided with a verbal description of the nature and purpose of the present neuropsychological evaluation. Also reviewed were the foreseeable risks and/or discomforts and benefits of the procedure, limits of confidentiality, and mandatory reporting requirements of this provider. The patient was given the opportunity to ask questions and receive answers about the evaluation. Oral consent to participate was provided by the patient.   This evaluation was conducted by Christia Reading, Ph.D., licensed clinical neuropsychologist. Ms. Skalsky completed a comprehensive clinical interview with Dr. Melvyn Novas, billed as one unit [phone], and 150 minutes of cognitive testing and scoring, billed as one unit [phone] and four additional units [id number]. Psychometrist Milana Kidney, B.S., assisted Dr. Melvyn Novas with test administration and scoring procedures. As a separate and discrete service, Dr. Melvyn Novas spent a total of 160 minutes in interpretation and report writing billed as one unit [phone] and two units 96133.

## 2021-01-11 ENCOUNTER — Ambulatory Visit (INDEPENDENT_AMBULATORY_CARE_PROVIDER_SITE_OTHER): Payer: Non-veteran care | Admitting: Psychology

## 2021-01-11 ENCOUNTER — Other Ambulatory Visit: Payer: Self-pay

## 2021-01-11 DIAGNOSIS — G3184 Mild cognitive impairment, so stated: Secondary | ICD-10-CM

## 2021-01-11 NOTE — Patient Instructions (Signed)
A repeat neuropsychological evaluation in 12-18 months (or sooner if functional decline is noted) is recommended to assess the trajectory of future cognitive decline should it occur. This will also aid in future efforts towards improved diagnostic clarity.  I would recommend that Brandy Jackson complete a DaTscan in order to try and obtain greater diagnostic clarity. She should discuss the pros and cons of this form of neuroimaging with Dr. Carles Collet.   Brandy Jackson reported acutely poor sleep with significant symptoms of insomnia occurring during the past month. I would encourage her to discuss these symptoms with her PCP and consider medication options to help her fall asleep and remain asleep throughout the night.   Should there be a progression of her current deficits over time, Brandy Jackson is unlikely to regain any independent living skills lost. Therefore, it is recommended that she remain as involved as possible in all aspects of household chores, finances, and medication management, with supervision to ensure adequate performance. She will likely benefit from the establishment and maintenance of a routine in order to maximize her functional abilities over time.  It will be important for Brandy Jackson to have another person with her when in situations where she may need to process information, weigh the pros and cons of different options, and make decisions, in order to ensure that she fully understands and recalls all information to be considered.  If not already done, Brandy Jackson and her family may want to discuss her wishes regarding durable power of attorney and medical decision making, so that she can have input into these choices. Additionally, they may wish to discuss future plans for caretaking and seek out community options for in home/residential care should they become necessary.   Performance across neurocognitive testing is not a strong predictor of an individual's safety operating a motor  vehicle. Should her family wish to pursue a formalized driving evaluation, they would be encouraged to [employer] [facility] in [place], [name] at [phone].Another option would be through [facility]; however, the latter would likely require a referral from a medical doctor. [facility] can be reached directly at [phone].  When learning new information, she would benefit from information being broken up into small, manageable pieces. She may also find it helpful to articulate the material in her own words and in a context to promote encoding at the onset of a new task. This material may need to be repeated multiple times to promote encoding.  Memory can be improved using internal strategies such as rehearsal, repetition, chunking, mnemonics, association, and imagery. External strategies such as written notes in a consistently used memory journal, visual and nonverbal auditory cues such as a calendar on the refrigerator or appointments with alarm, such as on a cell phone, can also help maximize recall.    To address problems with processing speed, she may wish to consider:   -Ensuring that she is alerted when essential material or instructions are being presented   -Adjusting the speed at which new information is presented   -Allowing for more time in comprehending, processing, and responding in conversation  To address problems with fluctuating attention, she may wish to consider:   -Avoiding external distractions when needing to concentrate   -Limiting exposure to fast paced environments with multiple sensory demands   -Writing down complicated information and using checklists   -Attempting and completing one task at a time (i.e., no multi-tasking)   -Verbalizing aloud each step of a task to maintain focus   -  Reducing the amount of information considered at one time

## 2021-01-11 NOTE — Progress Notes (Signed)
   Neuropsychology Feedback Session [name]. [place] Department of Neurology  Reason for Referral:   Brandy Jackson a 70 y.o. right-handed African-American female referred by Alonza Bogus, D.O.,to characterize hercurrent cognitive functioning and assist with diagnostic clarity and treatment planning in the context of ongoing tremor and concerns for cognitive decline.   Feedback:   [name] completed a comprehensive neuropsychological evaluation on 01/04/2021. Please refer to that encounter for the full report and recommendations. Briefly, results suggested fairly diffuse cognitive decline with a majority of performances falling in the well below average to below average normative ranges relative to age-matched peers. Outside of strong performance across a task assessing safety and judgment, no consistent strengths emerged. Her most significant weaknesses surrounded processing speed, attention/concentration, and executive functioning. The etiology of her cognitive decline is unclear. Frontal-subcortical dysfunction is common in primary vascular etiologies, as well as Parkinson's disease and other neurological conditions within this spectrum. However, regarding the former, her most recent brain MRI was said to exhibit mild chronic small vessel disease, likely not a significant enough amount to warrant such widespread decline. Regarding the latter, Lewy body dementia should remain on her differential as this represents the second most common form of neurodegenerative illness. [name] did exhibit tremors primarily in her left hand, as well as some gait abnormalities. There was also reason to be concerned for fluctuations in alertness throughout testing. However, there remains a significant chance that gait abnormalities are due to a longstanding knee deformity. [name] did not report ongoing visual hallucinations or REM sleep behaviors which are commonly seen in Lewy  body dementia and while frontal-subcortical dysfunction does align with this condition, I would expect visuospatial functioning to be worse than current performances.   [name] was unaccompanied during the current feedback session. Content of the current session focused on the results of her neuropsychological evaluation. [name] was given the opportunity to ask questions and her questions were answered. She was encouraged to reach out should additional questions arise. A copy of her report was provided at the conclusion of the visit.      Less than 16 minutes were spent conducting the current feedback session with [name].

## 2021-01-16 ENCOUNTER — Other Ambulatory Visit: Payer: Self-pay

## 2021-01-16 ENCOUNTER — Ambulatory Visit: Payer: No Typology Code available for payment source | Attending: Family Medicine

## 2021-01-16 DIAGNOSIS — M25662 Stiffness of left knee, not elsewhere classified: Secondary | ICD-10-CM | POA: Insufficient documentation

## 2021-01-16 DIAGNOSIS — M25562 Pain in left knee: Secondary | ICD-10-CM | POA: Insufficient documentation

## 2021-01-16 DIAGNOSIS — G8929 Other chronic pain: Secondary | ICD-10-CM | POA: Diagnosis present

## 2021-01-16 NOTE — Therapy (Signed)
[facility] [place], [place], [address] Phone: [phone]   Fax:  [phone]  Physical Therapy Treatment  Patient Details  Name: Brandy Jackson MRN: [id number] Date of Birth: [birth year] Referring Provider (PT): Lottie Dawson, MD (Resident); Melynda Keller FNP (PCP in Epic)   Encounter Date: 01/16/2021   PT End of Session - 01/16/21 1457    Visit Number 8    Number of Visits 15    Date for PT Re-Evaluation 02/02/21    Authorization Type [facility]    Authorization - Visit Number 8    Authorization - Number of Visits 15    PT Start Time 1962   pt arrived late   PT Stop Time 1530    PT Time Calculation (min) 39 min    Activity Tolerance Patient tolerated treatment well    Behavior During Therapy WFL for tasks assessed/performed           Past Medical History:  Diagnosis Date  . Acute on chronic diastolic CHF (congestive heart failure) 10/04/2017  . 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 Bilateral lower extremity edema 09/15/2014  . Breast cancer of upper-outer quadrant of right female breast 03/03/2014   ER/PR+ Her2+ Right IDC; completed chemotherapy and radiation treatment  . Hot flashes   . 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 Laryngitis 08/18/2014  . 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 Mild neurocognitive disorder, unclear etiology 01/04/2021  . 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 Psoriatic arthritis 07/31/2015  . 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 Rectal bleed 06/30/2014  . Rheumatoid arthritis 08/14/2016  . Tinea corporis   . Tremor    primarily left hand  . Type II diabetes mellitus with manifestations 08/04/2014  . Vitamin D deficiency 04/13/2015  . Wears glasses   . Wears partial dentures    bottom    Past Surgical History:  Procedure Laterality Date  . BLADDER REPAIR W/ CESAREAN SECTION    . COLONOSCOPY    . PORTACATH  PLACEMENT N/A 04/11/2014   Procedure: INSERTION PORT-A-CATH;  Surgeon: Joyice Faster. Cornett, MD;  Location: [facility];  Service: General;  Laterality: N/A;  . RE-EXCISION OF BREAST LUMPECTOMY Right 06/06/2014   Procedure: RE-EXCISION OF BREAST LUMPECTOMY;  Surgeon: Marcello Moores A. Cornett, MD;  Location: [facility];  Service: General;  Laterality: Right;    There were no vitals filed for this visit.   Subjective Assessment - 01/16/21 1454    Subjective "It's been doing pretty well. I get a little tired when I do my exercises - it's not pain, it's just fatigue."    Pertinent History Breast CA, DM, psoriatic arthritis    How long can you sit comfortably? 30 minutes then I have to get up and move around    How long can you stand comfortably? standing doesn't bother me at all    How long can you walk comfortably? I walk for 30 minutes a day and I can tolerate it.    Patient Stated Goals "I want to be able to run, and I want to be able to sit down on the floor with my legs crossed"    Currently in Pain? Yes    Pain Score 7     Pain Location Knee    Pain Orientation Left;Lateral    Pain Descriptors / Indicators Sore    Pain Type Chronic pain    Pain Onset More than a month ago  [facility] PT Assessment - 01/16/21 0001      Assessment   Medical Diagnosis Left knee pain    Referring Provider (PT) Lottie Dawson, MD (Resident); Melynda Keller FNP (PCP in Epic)    Onset Date/Surgical Date 10/15/20                         [facility] Adult PT Treatment/Exercise - 01/16/21 0001      Ambulation/Gait   Stairs Yes    Stairs Assistance 6: Modified independent (Device/Increase time)    Stair Management Technique Other (comment)   1 HR ascending; 2 HR descending   Number of Stairs 12    Height of Stairs 4    Gait Comments Reciprocal pattern ascending. Attempted reciprocal pattern descending but pt unable to perform due to decreased eccentric quad  strength and apprehension.      Posture/Postural Control   Posture Comments genu valgum LLE, foot ER      Self-Care   Self-Care Other Self-Care Comments    Other Self-Care Comments  See pt education      Knee/Hip Exercises: Stretches   Gastroc Stretch Both;3 reps;30 seconds    Gastroc Stretch Limitations on slant board with least amount of incline      Knee/Hip Exercises: Aerobic   Nustep L4 x 5 min      Knee/Hip Exercises: Standing   Heel Raises Both;2 sets;15 reps    Forward Step Up Left;20 reps;Step Height: 4"    Forward Step Up Limitations LLE ascending and RLE descending    Wall Squat 10 reps    Wall Squat Limitations 5-10 sec holds as tolerated    Stairs --      Knee/Hip Exercises: Seated   Long Arc Quad Strengthening;Left;2 sets;15 reps;Weights    Long [employer] Limitations 1 set without weights and with 5 second hold at end range L knee EXT; 1 set with 1# and 5 sec hold at end range knee EXT      Knee/Hip Exercises: Supine   Straight Leg Raises Strengthening;Left;15 reps    Straight Leg Raises Limitations cues for slow, controlled motion and quad activation prior to SLR                  PT Education - 01/16/21 1543    Education Details Demonstrated and explained reciprocal gait pattern and concentric vs eccentric quad contraction during gait training and throughout LE exercises    Person(s) Educated Patient    Methods Explanation;Demonstration;Tactile cues;Verbal cues    Comprehension Verbalized understanding;Returned demonstration;Need further instruction            PT Short Term Goals - 01/01/21 1545      PT SHORT TERM GOAL #1   Title ind with initial HEP    Time 4    Period Weeks    Status Achieved    Target Date 12/10/20             PT Long Term Goals - 01/16/21 1540      PT LONG TERM GOAL #1   Title decreased pain in left knee by >= 75% with sitting and walking.    Baseline reports pain can increase to 8/10 with prolonged sitting  01/03/21    Time 8    Period Weeks    Status On-going      PT LONG TERM GOAL #2   Title Improved left knee flexion to 5-110 deg or greater to normalize functional mobility.  Baseline bony endfeel with PROM and assessment    Time 8    Period Weeks    Status Not Met      PT LONG TERM GOAL #3   Title pt able to demo improved BLE strength to allow reciprocal gait on stairs.    Baseline reciprocal pattern ascending but not descending    Time 8    Period Weeks    Status Partially Met      PT LONG TERM GOAL #4   Title Independent in HEP     Time 8    Period Weeks    Status On-going                 Plan - 01/16/21 1457    Clinical Impression Statement Patient tolerated treatment session well with increased focus on L eccentric quadriceps control. Pt was able to demonstrate reciprocal step pattern ascending stairs with moderate reliance on R handrail. She descends with LLE first going down stairs and was unable to descend with RLE without leaning to R side against R handrail when attempted. Pt unable to maintain balance without UE support while in tandem stance with LLE posterior and compensates with R knee FL when she separated feet slightly in modified tandem position. She may benefit from focus on strengthening, especially L quad eccentric strength, and balance activities for improved safety at home. Pt mentions a knee brace she has and plans to bring next session for assistance with donning/doffing.    Personal Factors and Comorbidities Comorbidity 3+;Past/Current Experience    Comorbidities Breast CA, DM, psoriatic arthritis    Examination-Activity Limitations Sit;Stairs    Stability/Clinical Decision Making Stable/Uncomplicated    Rehab Potential Excellent    PT Frequency 2x / week    PT Duration 4 weeks    PT Treatment/Interventions ADLs/Self Care Home Management;Iontophoresis 4mg /ml Dexamethasone;Stair training;Therapeutic activities;Therapeutic exercise;Neuromuscular  re-education;Manual techniques;Patient/family education;Dry needling;Taping    PT Next Visit Plan Assist pt with donning/doffing her knee brace if she brings it to clinic. Hip and knee strengthening, consider TKE, knee eccentric exercises, consider leg press. update HEP.    PT Home Exercise Plan [id number]    Consulted and Agree with Plan of Care Patient           Patient will benefit from skilled therapeutic intervention in order to improve the following deficits and impairments:  Abnormal gait,Decreased range of motion,Pain,Impaired flexibility,Decreased strength,Postural dysfunction  Visit Diagnosis: Stiffness of left knee, not elsewhere classified  Chronic pain of left knee     Problem List Patient Active Problem List   Diagnosis Date Noted  . Mild neurocognitive disorder, unclear etiology 01/04/2021  . Tinea corporis   . 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 Acute on chronic diastolic CHF (congestive heart failure) 10/04/2017  . 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 Rheumatoid arthritis 08/14/2016  . Psoriatic arthritis ([place]) 07/31/2015  . Vitamin D deficiency 04/13/2015  . 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 Bilateral lower extremity edema 09/15/2014  . Type II diabetes mellitus with manifestations 08/04/2014  . Peripheral neuropathy due to chemotherapy ([place]) 08/04/2014  . Fatigue due to treatment 08/04/2014  . Breast cancer of upper-outer quadrant of right female breast 03/03/2014      Haydee Monica, PT, DPT 01/16/21 3:44 PM  [facility] [address] [place], [place], [address] Phone: [phone]   Fax:  [phone]  Name: Brandy Jackson MRN: [id number] Date of Birth: 1951-10-11

## 2021-01-22 ENCOUNTER — Ambulatory Visit: Payer: No Typology Code available for payment source

## 2021-01-22 ENCOUNTER — Other Ambulatory Visit: Payer: Self-pay

## 2021-01-22 DIAGNOSIS — G8929 Other chronic pain: Secondary | ICD-10-CM

## 2021-01-22 DIAGNOSIS — M25662 Stiffness of left knee, not elsewhere classified: Secondary | ICD-10-CM

## 2021-01-22 DIAGNOSIS — M25562 Pain in left knee: Secondary | ICD-10-CM

## 2021-01-22 NOTE — Therapy (Signed)
[place], [place], [address] Phone: [phone]   Fax:  [phone]  Physical Therapy Treatment  Patient Details  Name: Brandy Jackson MRN: [id number] Date of Birth: [birth year] Referring Provider (PT): Lottie Dawson, MD (Resident); Melynda Keller FNP (PCP in Epic)   Encounter Date: 01/22/2021   PT End of Session - 01/22/21 1642    Visit Number 9    Number of Visits 15    Date for PT Re-Evaluation 02/02/21    Authorization Type [facility]    Authorization - Visit Number 9    Authorization - Number of Visits 15    PT Start Time 7867    PT Stop Time 1715    PT Time Calculation (min) 40 min    Activity Tolerance Patient tolerated treatment well    Behavior During Therapy WFL for tasks assessed/performed           Past Medical History:  Diagnosis Date  . Acute on chronic diastolic CHF (congestive heart failure) 10/04/2017  . 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 Bilateral lower extremity edema 09/15/2014  . Breast cancer of upper-outer quadrant of right female breast 03/03/2014   ER/PR+ Her2+ Right IDC; completed chemotherapy and radiation treatment  . Hot flashes   . 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 Laryngitis 08/18/2014  . 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 Mild neurocognitive disorder, unclear etiology 01/04/2021  . 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 Psoriatic arthritis 07/31/2015  . 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 Rectal bleed 06/30/2014  . Rheumatoid arthritis 08/14/2016  . Tinea corporis   . Tremor    primarily left hand  . Type II diabetes mellitus with manifestations 08/04/2014  . Vitamin D deficiency 04/13/2015  . Wears glasses   . Wears partial dentures    bottom    Past Surgical History:  Procedure Laterality Date  . BLADDER REPAIR W/ CESAREAN SECTION    . COLONOSCOPY    . PORTACATH PLACEMENT N/A 04/11/2014    Procedure: INSERTION PORT-A-CATH;  Surgeon: Joyice Faster. Cornett, MD;  Location: [facility];  Service: General;  Laterality: N/A;  . RE-EXCISION OF BREAST LUMPECTOMY Right 06/06/2014   Procedure: RE-EXCISION OF BREAST LUMPECTOMY;  Surgeon: Marcello Moores A. Cornett, MD;  Location: [facility];  Service: General;  Laterality: Right;    There were no vitals filed for this visit.   Subjective Assessment - 01/22/21 1639    Subjective "It's bothering me a little bit today, I always feel better after I see you guys."    Pertinent History Breast CA, DM, psoriatic arthritis    How long can you sit comfortably? 30 minutes then I have to get up and move around    How long can you stand comfortably? standing doesn't bother me at all    How long can you walk comfortably? I walk for 30 minutes a day and I can tolerate it.    Patient Stated Goals "I want to be able to run, and I want to be able to sit down on the floor with my legs crossed"    Currently in Pain? Yes    Pain Score 7     Pain Location Knee    Pain Orientation Left;Lateral    Pain Descriptors / Indicators Sore    Pain Type Chronic pain    Pain Onset More than a month ago  [facility] Adult PT Treatment/Exercise - 01/22/21 0001      Knee/Hip Exercises: Aerobic   Nustep L4 x 5 min      Knee/Hip Exercises: Standing   Step Down Limitations 2 inch step LLE attempted    Functional Squat Limitations mini squat with UE support attempted pre and post manual      Knee/Hip Exercises: Seated   Long Arc Quad Limitations 1 x 10 1#; second set attempted but pain along Lt lateral knee      Knee/Hip Exercises: Supine   Straight Leg Raises Limitations 2 x 10      Knee/Hip Exercises: Sidelying   Hip ABduction Limitations 2 x 10    Clams 2 x 10      Manual Therapy   Soft tissue mobilization to distal quadricep, IT band LLE                    PT Short Term Goals -  01/01/21 1545      PT SHORT TERM GOAL #1   Title ind with initial HEP    Time 4    Period Weeks    Status Achieved    Target Date 12/10/20             PT Long Term Goals - 01/16/21 1540      PT LONG TERM GOAL #1   Title decreased pain in left knee by >= 75% with sitting and walking.    Baseline reports pain can increase to 8/10 with prolonged sitting 01/03/21    Time 8    Period Weeks    Status On-going      PT LONG TERM GOAL #2   Title Improved left knee flexion to 5-110 deg or greater to normalize functional mobility.    Baseline bony endfeel with PROM and assessment    Time 8    Period Weeks    Status Not Met      PT LONG TERM GOAL #3   Title pt able to demo improved BLE strength to allow reciprocal gait on stairs.    Baseline reciprocal pattern ascending but not descending    Time 8    Period Weeks    Status Partially Met      PT LONG TERM GOAL #4   Title Independent in HEP     Time 8    Period Weeks    Status On-going                 Plan - 01/22/21 1650    Clinical Impression Statement Fair tolerance to today's session with patient reporting increased Lt knee pain with standing exercises that was not relieved with manual therapy techniques. In addition to Lt knee pain with CKC activity patient is even fearful to attempt step down on the LLE from low height (2 inches) as she is afraid of falling. Better tolerance to supine and sidelying strengthening without increased knee pain reported.    Personal Factors and Comorbidities Comorbidity 3+;Past/Current Experience    Comorbidities Breast CA, DM, psoriatic arthritis    Examination-Activity Limitations Sit;Stairs    Stability/Clinical Decision Making Stable/Uncomplicated    Rehab Potential Excellent    PT Frequency 2x / week    PT Duration 4 weeks    PT Treatment/Interventions ADLs/Self Care Home Management;Iontophoresis 69m/ml Dexamethasone;Stair training;Therapeutic activities;Therapeutic  exercise;Neuromuscular re-education;Manual techniques;Patient/family education;Dry needling;Taping    PT Next Visit Plan Assist pt with donning/doffing her knee brace if she brings it to clinic. Hip  and knee strengthening, consider TKE, knee eccentric exercises, consider leg press.    PT Home Exercise Plan [id number]    Consulted and Agree with Plan of Care Patient           Patient will benefit from skilled therapeutic intervention in order to improve the following deficits and impairments:  Abnormal gait,Decreased range of motion,Pain,Impaired flexibility,Decreased strength,Postural dysfunction  Visit Diagnosis: Stiffness of left knee, not elsewhere classified  Chronic pain of left knee     Problem List Patient Active Problem List   Diagnosis Date Noted  . Mild neurocognitive disorder, unclear etiology 01/04/2021  . Tinea corporis   . 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 Acute on chronic diastolic CHF (congestive heart failure) 10/04/2017  . 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 Rheumatoid arthritis 08/14/2016  . Psoriatic arthritis ([place]) 07/31/2015  . Vitamin D deficiency 04/13/2015  . 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 Bilateral lower extremity edema 09/15/2014  . Type II diabetes mellitus with manifestations 08/04/2014  . Peripheral neuropathy due to chemotherapy ([place]) 08/04/2014  . Fatigue due to treatment 08/04/2014  . Breast cancer of upper-outer quadrant of right female breast 03/03/2014   Gwendolyn Grant, PT, DPT, ATC 01/22/21 5:17 PM  [place], [place], [address] Phone: [phone]   Fax:  [phone]  Name: Brandy Jackson MRN: [id number] Date of Birth: 10-May-1951

## 2021-01-24 ENCOUNTER — Ambulatory Visit: Payer: No Typology Code available for payment source

## 2021-01-29 ENCOUNTER — Other Ambulatory Visit: Payer: Self-pay

## 2021-01-29 ENCOUNTER — Ambulatory Visit: Payer: No Typology Code available for payment source

## 2021-01-29 DIAGNOSIS — G8929 Other chronic pain: Secondary | ICD-10-CM

## 2021-01-29 DIAGNOSIS — M25662 Stiffness of left knee, not elsewhere classified: Secondary | ICD-10-CM

## 2021-01-29 NOTE — Therapy (Signed)
[place], [place], [address] Phone: [phone]   Fax:  [phone]  Physical Therapy Treatment  Patient Details  Name: Brandy Jackson MRN: [id number] Date of Birth: 01-24-1951 Referring Provider (PT): Lottie Dawson, MD (Resident); Melynda Keller FNP (PCP in Epic)   Encounter Date: 01/29/2021   PT End of Session - 01/29/21 1658    Visit Number 10    Number of Visits 15    Date for PT Re-Evaluation 02/02/21    Authorization Type [facility] Choice    Authorization - Visit Number 10    Authorization - Number of Visits 15    PT Start Time 1700    PT Stop Time 1740    PT Time Calculation (min) 40 min    Activity Tolerance Patient tolerated treatment well    Behavior During Therapy WFL for tasks assessed/performed           Past Medical History:  Diagnosis Date  . Acute on chronic diastolic CHF (congestive heart failure) 10/04/2017  . 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 Bilateral lower extremity edema 09/15/2014  . Breast cancer of upper-outer quadrant of right female breast 03/03/2014   ER/PR+ Her2+ Right IDC; completed chemotherapy and radiation treatment  . Hot flashes   . 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 Laryngitis 08/18/2014  . 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 Mild neurocognitive disorder, unclear etiology 01/04/2021  . 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 Psoriatic arthritis 07/31/2015  . 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 Rectal bleed 06/30/2014  . Rheumatoid arthritis 08/14/2016  . Tinea corporis   . Tremor    primarily left hand  . Type II diabetes mellitus with manifestations 08/04/2014  . Vitamin D deficiency 04/13/2015  . Wears glasses   . Wears partial dentures    bottom    Past Surgical History:  Procedure Laterality Date  . BLADDER REPAIR W/ CESAREAN SECTION    . COLONOSCOPY    . PORTACATH PLACEMENT N/A  04/11/2014   Procedure: INSERTION PORT-A-CATH;  Surgeon: Joyice Faster. Cornett, MD;  Location: [facility];  Service: General;  Laterality: N/A;  . RE-EXCISION OF BREAST LUMPECTOMY Right 06/06/2014   Procedure: RE-EXCISION OF BREAST LUMPECTOMY;  Surgeon: Marcello Moores A. Cornett, MD;  Location: [facility];  Service: General;  Laterality: Right;    There were no vitals filed for this visit.   Subjective Assessment - 01/29/21 1706    Subjective "I'm doing great. My pain is the same - about a 7/10. It hasn't gotten any worse but is just the same."    Pertinent History Breast CA, DM, psoriatic arthritis    How long can you sit comfortably? 30 minutes then I have to get up and move around    How long can you stand comfortably? standing doesn't bother me at all    How long can you walk comfortably? I walk for 30 minutes a day and I can tolerate it.    Patient Stated Goals "I want to be able to run, and I want to be able to sit down on the floor with my legs crossed"    Currently in Pain? Yes    Pain Score 7     Pain Location Knee    Pain Orientation Left;Lateral;Posterior    Pain Descriptors / Indicators Sore    Pain Onset More than a month ago  [facility] PT Assessment - 01/29/21 0001      Assessment   Medical Diagnosis Left knee pain    Referring Provider (PT) Lottie Dawson, MD (Resident); Melynda Keller FNP (PCP in Epic)    Onset Date/Surgical Date 10/15/20                         [facility] Adult PT Treatment/Exercise - 01/29/21 0001      Ambulation/Gait   Ambulation/Gait Yes    Ambulation/Gait Assistance 6: Modified independent (Device/Increase time)    Ambulation Distance (Feet) 180 Feet    Assistive device Straight cane   SPC in RUE   Gait Pattern Step-through pattern    Ambulation Surface Level;Indoor    Gait Comments Adjusted SPC height then performed gait training to assess pt's comfort and gait pattern using cane at appropriate  height      Posture/Postural Control   Posture Comments genu valgum LLE, foot ER      Self-Care   Self-Care Other Self-Care Comments    Other Self-Care Comments  See patient education      Knee/Hip Exercises: Stretches   Passive Hamstring Stretch Left;1 rep;60 seconds    Passive Hamstring Stretch Limitations seated at EOM      Knee/Hip Exercises: Aerobic   Nustep L4 x 5 min LE only      Knee/Hip Exercises: Standing   Heel Raises Both;1 set;20 reps    Heel Raises Limitations Limited range    Hip Flexion Limitations Alternating marches on airex x 30 (15x each LE) with BUE support at freemotion    Hip Abduction Stengthening;Both;1 set;15 reps;Knee straight    Abduction Limitations Cues for technique    Step Down Right;10 reps;Step Height: 2"    Step Down Limitations Cues for L knee flexion as pt descends with RLE. Pt still hesistant as she was previous session but motivated to perform successfully. Had some discomfort and lateral knee pain but was able to complete 10 reps.    Wall Squat Limitations wall squat against green swiss ball 2 x 10    Other Standing Knee Exercises Tandem standing 4 x 10 seconds each LE posteriorly placed with pt using UE assist to maintain balance PRN (2-3 sec bouts of no UE assist)                  PT Education - 01/29/21 1753    Education Details Reviewed HEP. Adjusted SPC to more appropriate height. Discussed D/C next session and to follow up with physician. Reminded patient to bring her knee brace next session and provided written reminder.    Person(s) Educated Patient    Methods Explanation;Demonstration;Tactile cues;Verbal cues    Comprehension Verbalized understanding;Returned demonstration;Need further instruction            PT Short Term Goals - 01/01/21 1545      PT SHORT TERM GOAL #1   Title ind with initial HEP    Time 4    Period Weeks    Status Achieved    Target Date 12/10/20             PT Long Term Goals - 01/29/21  1812      PT LONG TERM GOAL #1   Title decreased pain in left knee by >= 75% with sitting and walking.    Baseline pain severity remains unchanged    Time 8    Period Weeks    Status Not Met  PT LONG TERM GOAL #2   Title Improved left knee flexion to 5-110 deg or greater to normalize functional mobility.    Baseline bony endfeel with PROM and assessment    Time 8    Period Weeks    Status Not Met      PT LONG TERM GOAL #3   Title pt able to demo improved BLE strength to allow reciprocal gait on stairs.    Baseline reciprocal pattern ascending but not descending    Time 8    Period Weeks    Status On-going      PT LONG TERM GOAL #4   Title Independent in HEP     Time 8    Period Weeks    Status Achieved                 Plan - 01/29/21 1658    Clinical Impression Statement Patient tolerated treatment session fairly well with continued L knee pain that did not worsen significantly with exception of some aggravation during step down from 2" that did not linger at greater severity than baseline afterwards. She was able to tolerate step downs with BUE support at freemotion in addition to other standing and balance activities today. She expresses wanting to revisit stair negotiation from small steps during her last session to practice reciprocal gait pattern if pain level that day allows her to tolerate it. Discussed D/C next session as patient has been able to gradually progress exercises some sessions but is often limited by pain and has not had a change in her pain level overall. Her L knee ROM and functional mobility is impacted by significant genu valgus deformity.    Personal Factors and Comorbidities Comorbidity 3+;Past/Current Experience    Comorbidities Breast CA, DM, psoriatic arthritis    Examination-Activity Limitations Sit;Stairs    Stability/Clinical Decision Making Stable/Uncomplicated    Rehab Potential Excellent    PT Frequency 2x / week    PT Duration 4  weeks    PT Treatment/Interventions ADLs/Self Care Home Management;Iontophoresis 65m/ml Dexamethasone;Stair training;Therapeutic activities;Therapeutic exercise;Neuromuscular re-education;Manual techniques;Patient/family education;Dry needling;Taping    PT Next Visit Plan D/C next session. Assist pt with donning/doffing her knee brace if she brings it to clinic. Stair negotiation at small steps to practice reciprocal pattern    PT Home Exercise Plan [id number]    Consulted and Agree with Plan of Care Patient           Patient will benefit from skilled therapeutic intervention in order to improve the following deficits and impairments:  Abnormal gait,Decreased range of motion,Pain,Impaired flexibility,Decreased strength,Postural dysfunction  Visit Diagnosis: Stiffness of left knee, not elsewhere classified  Chronic pain of left knee     Problem List Patient Active Problem List   Diagnosis Date Noted  . Mild neurocognitive disorder, unclear etiology 01/04/2021  . Tinea corporis   . 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 Acute on chronic diastolic CHF (congestive heart failure) 10/04/2017  . 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 Rheumatoid arthritis 08/14/2016  . [place] ([place] 07/31/2015  . Vitamin D deficiency 04/13/2015  . 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 Bilateral lower extremity edema 09/15/2014  . Type II diabetes mellitus with manifestations 08/04/2014  . Peripheral [place] due to [place] ([place] 08/04/2014  . Fatigue due to treatment 08/04/2014  . Breast cancer of upper-outer quadrant of right female breast 03/03/2014     KHaydee Monica PT, DPT 01/29/21 6:13 PM  [facility]  [place], [place], [address] Phone: [phone]   Fax:  [phone]  Name: KYRSTAL MONTERROSA MRN: [id number] Date of Birth: 1950-12-29

## 2021-01-31 ENCOUNTER — Other Ambulatory Visit: Payer: Self-pay

## 2021-01-31 ENCOUNTER — Ambulatory Visit: Payer: No Typology Code available for payment source

## 2021-01-31 DIAGNOSIS — G8929 Other chronic pain: Secondary | ICD-10-CM

## 2021-01-31 DIAGNOSIS — M25662 Stiffness of left knee, not elsewhere classified: Secondary | ICD-10-CM

## 2021-01-31 DIAGNOSIS — M25562 Pain in left knee: Secondary | ICD-10-CM

## 2021-01-31 NOTE — Therapy (Deleted)
[place], [place], [address] Phone: [phone]   Fax:  [phone]  Physical Therapy Evaluation  Patient Details  Name: Brandy Jackson MRN: [id number] Date of Birth: 1951/07/30 Referring Provider (PT): Lottie Dawson, MD (Resident); Melynda Keller FNP (PCP in Epic)   Encounter Date: 01/31/2021   PT End of Session - 01/31/21 1553    Visit Number 11    Number of Visits 15    Date for PT Re-Evaluation 02/02/21    Authorization Type [facility] [employer]    Authorization - Visit Number 11    Authorization - Number of Visits 15    PT Start Time 1546    PT Stop Time 1630    PT Time Calculation (min) 44 min    Equipment Utilized During Treatment Gait belt   SPC   Activity Tolerance Patient tolerated treatment well    Behavior During Therapy WFL for tasks assessed/performed           Past Medical History:  Diagnosis Date  . Acute on chronic diastolic CHF (congestive heart failure) 10/04/2017  . 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 Bilateral lower extremity edema 09/15/2014  . Breast cancer of upper-outer quadrant of right female breast 03/03/2014   ER/PR+ Her2+ Right IDC; completed chemotherapy and radiation treatment  . Hot flashes   . 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 Laryngitis 08/18/2014  . 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 Mild neurocognitive disorder, unclear etiology 01/04/2021  . 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 Psoriatic arthritis 07/31/2015  . 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 Rectal bleed 06/30/2014  . Rheumatoid arthritis 08/14/2016  . Tinea corporis   . Tremor    primarily left hand  . Type II diabetes mellitus with manifestations 08/04/2014  . Vitamin D deficiency 04/13/2015  . Wears glasses   . Wears partial dentures    bottom    Past Surgical History:  Procedure Laterality Date  . BLADDER REPAIR W/ CESAREAN SECTION     . COLONOSCOPY    . PORTACATH PLACEMENT N/A 04/11/2014   Procedure: INSERTION PORT-A-CATH;  Surgeon: Joyice Faster. Cornett, MD;  Location: [facility];  Service: General;  Laterality: N/A;  . RE-EXCISION OF BREAST LUMPECTOMY Right 06/06/2014   Procedure: RE-EXCISION OF BREAST LUMPECTOMY;  Surgeon: Marcello Moores A. Cornett, MD;  Location: [facility];  Service: General;  Laterality: Right;    There were no vitals filed for this visit.    Subjective Assessment - 01/31/21 1548    Subjective "I'm doing good today, my pain is the same, about a 7. I did my 30 minute walking today."    Pertinent History Breast CA, DM, psoriatic arthritis    How long can you sit comfortably? 30 minutes then I have to get up and move around    How long can you stand comfortably? standing doesn't bother me at all    How long can you walk comfortably? I walk for 30 minutes a day and I can tolerate it.    Patient Stated Goals "I want to be able to run, and I want to be able to sit down on the floor with my legs crossed"    Currently in Pain? Yes    Pain Score 7     Pain Location Knee    Pain Orientation Left    Pain Descriptors / Indicators Aching    Pain Type Chronic pain    Pain Onset More  than a month ago    Pain Frequency Constant              [facility] PT Assessment - 01/31/21 0001      Observation/Other Assessments   Focus on Therapeutic Outcomes (FOTO)  49% function      AROM   Left Knee Extension --   lacking 15 degrees   Left Knee Flexion 90      PROM   Overall PROM Comments bony end feel    Left Knee Extension lacking 15    Left Knee Flexion 90      Strength   Right Hip Flexion 5/5    Right Hip ABduction 4/5    Left Hip Flexion 5/5    Left Hip Extension --    Left Hip ABduction 4/5    Right Knee Flexion 5/5    Right Knee Extension 5/5    Left Knee Flexion 4+/5    Left Knee Extension 4+/5   in available ROM   Right Ankle Dorsiflexion 5/5    Right Ankle Plantar Flexion  5/5    Left Ankle Dorsiflexion 5/5    Left Ankle Plantar Flexion 5/5                      Objective measurements completed on examination: See above findings.       [facility] Adult PT Treatment/Exercise - 01/31/21 0001      Ambulation/Gait   Ambulation/Gait Yes    Ambulation/Gait Assistance 6: Modified independent (Device/Increase time)    Ambulation Distance (Feet) 180 Feet    Assistive device Straight cane    Gait Pattern Step-through pattern;Shuffle    Ambulation Surface Level;Indoor    Stairs Yes   3 trials ascending and descending stairs 4 inch and 6 inch   Stairs Assistance 6: Modified independent (Device/Increase time)    Stair Management Technique Other (comment);Step to pattern   1 hand rail   Gait Comments cues for appropriate cane position for stair negotiation and to allow for contact during gait cycle as she has tendency to sometimes place cane on floor and sometimes keep it up      Posture/Postural Control   Posture Comments genu valgum LLE, foot ER      Self-Care   Other Self-Care Comments  see patient education      Knee/Hip Exercises: Seated   Long Arc Quad Limitations 2 x 10 bilateral    Marching Limitations 2 x 10 green band                  PT Education - 01/31/21 1624    Education Details D/C education. FOTO results. Reviewed and updated HEP. Recommended patient f/u with physician and discuss other bracing as her Reed Breech bracing does not fit properly due to her valgus alignment. Modalities for pain control.    Person(s) Educated Patient    Methods Explanation;Demonstration;Verbal cues    Comprehension Verbalized understanding;Returned demonstration;Verbal cues required            PT Short Term Goals - 01/01/21 1545      PT SHORT TERM GOAL #1   Title ind with initial HEP    Time 4    Period Weeks    Status Achieved    Target Date 12/10/20             PT Long Term Goals - 01/31/21 1552      PT LONG TERM GOAL #1   Title  decreased  pain in left knee by >= 75% with sitting and walking.    Baseline pain severity remains unchanged    Time 8    Period Weeks    Status Not Met      PT LONG TERM GOAL #2   Title Improved left knee flexion to 5-110 deg or greater to normalize functional mobility.    Baseline see flowsheet    Time 8    Period Weeks    Status Not Met      PT LONG TERM GOAL #3   Title pt able to demo improved BLE strength to allow reciprocal gait on stairs.    Baseline see MMT in flowsheet;reciprocal pattern ascending but not descending    Time 8    Period Weeks    Status On-going      PT LONG TERM GOAL #4   Title Independent in HEP     Time 8    Period Weeks    Status Achieved                  Plan - 01/31/21 1619    Clinical Impression Statement Patient has attended 11 PT session with limited overall functional progress and has not experienced a lasting reduction in her Lt knee pain since initiating care. Her Lt knee ROM remains relatively unchanged since beginning PT and she has made modest improvements in her LE strength. She has very limited A/PROM in the Lt knee with a bony end feel and significant valgus deformity that is impacting her overall functional mobility. Due to lack of overall progress patient is appropriate for discharge at this time with recommendation to f/u with physician to discuss other measures to reduce her pain and improve her function.    Personal Factors and Comorbidities Comorbidity 3+;Past/Current Experience    Comorbidities Breast CA, DM, psoriatic arthritis    Examination-Activity Limitations Sit;Stairs    Stability/Clinical Decision Making Stable/Uncomplicated    Rehab Potential Excellent    PT Frequency 2x / week    PT Duration 4 weeks    PT Treatment/Interventions ADLs/Self Care Home Management;Iontophoresis 52m/ml Dexamethasone;Stair training;Therapeutic activities;Therapeutic exercise;Neuromuscular re-education;Manual techniques;Patient/family  education;Dry needling;Taping    PT Next Visit Plan --    PT Home Exercise Plan [id number]    Consulted and Agree with Plan of Care Patient           Patient will benefit from skilled therapeutic intervention in order to improve the following deficits and impairments:  Abnormal gait,Decreased range of motion,Pain,Impaired flexibility,Decreased strength,Postural dysfunction  Visit Diagnosis: Stiffness of left knee, not elsewhere classified  Chronic pain of left knee     Problem List Patient Active Problem List   Diagnosis Date Noted  . Mild neurocognitive disorder, unclear etiology 01/04/2021  . Tinea corporis   . 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 Acute on chronic diastolic CHF (congestive heart failure) 10/04/2017  . 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 Rheumatoid arthritis 08/14/2016  . Psoriatic arthritis ([facility] 07/31/2015  . Vitamin D deficiency 04/13/2015  . 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 Bilateral lower extremity edema 09/15/2014  . Type II diabetes mellitus with manifestations 08/04/2014  . Peripheral neuropathy due to chemotherapy ([facility] 08/04/2014  . Fatigue due to treatment 08/04/2014  . Breast cancer of upper-outer quadrant of right female breast 03/03/2014   PHYSICAL THERAPY DISCHARGE SUMMARY  Visits from Start of Care: 11  Current functional level related to goals / functional outcomes: See above   Remaining deficits: See above   Education /  Equipment: See above   Plan: Patient agrees to discharge.  Patient goals were partially met. Patient is being discharged due to lack of progress.  ?????         Brandy Jackson, PT, DPT, ATC 01/31/21 5:03 PM  [facility] [address] [place], [place], [address] Phone: [phone]   Fax:  [phone]  Name: Brandy Jackson MRN: [id number] Date of Birth: 1951/10/30

## 2021-01-31 NOTE — Therapy (Signed)
[place], [place], [address] Phone: [phone]   Fax:  [phone]  Physical Therapy Treatment/Discharge   Patient Details  Name: Brandy Jackson MRN: [id number] Date of Birth: February 12, 1951 Referring Provider (PT): Lottie Dawson, MD (Resident); Melynda Keller FNP (PCP in Epic)   Encounter Date: 01/31/2021   PT End of Session - 01/31/21 1553    Visit Number 11    Number of Visits 15    Date for PT Re-Evaluation 02/02/21    Authorization Type [facility] Choice    Authorization - Visit Number 11    Authorization - Number of Visits 15    PT Start Time 1546    PT Stop Time 1630    PT Time Calculation (min) 44 min    Equipment Utilized During Treatment Gait belt   SPC   Activity Tolerance Patient tolerated treatment well    Behavior During Therapy WFL for tasks assessed/performed           Past Medical History:  Diagnosis Date  . Acute on chronic diastolic CHF (congestive heart failure) 10/04/2017  . 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 Bilateral lower extremity edema 09/15/2014  . Breast cancer of upper-outer quadrant of right female breast 03/03/2014   ER/PR+ Her2+ Right IDC; completed chemotherapy and radiation treatment  . Hot flashes   . 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 Laryngitis 08/18/2014  . 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 Mild neurocognitive disorder, unclear etiology 01/04/2021  . 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 Psoriatic arthritis 07/31/2015  . 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 Rectal bleed 06/30/2014  . Rheumatoid arthritis 08/14/2016  . Tinea corporis   . Tremor    primarily left hand  . Type II diabetes mellitus with manifestations 08/04/2014  . Vitamin D deficiency 04/13/2015  . Wears glasses   . Wears partial dentures    bottom    Past Surgical History:  Procedure Laterality Date  . BLADDER REPAIR W/  CESAREAN SECTION    . COLONOSCOPY    . PORTACATH PLACEMENT N/A 04/11/2014   Procedure: INSERTION PORT-A-CATH;  Surgeon: Joyice Faster. Cornett, MD;  Location: [facility];  Service: General;  Laterality: N/A;  . RE-EXCISION OF BREAST LUMPECTOMY Right 06/06/2014   Procedure: RE-EXCISION OF BREAST LUMPECTOMY;  Surgeon: Marcello Moores A. Cornett, MD;  Location: [facility];  Service: General;  Laterality: Right;    There were no vitals filed for this visit.   Subjective Assessment - 01/31/21 1548    Subjective "I'm doing good today, my pain is the same, about a 7. I did my 30 minute walking today."    Pertinent History Breast CA, DM, psoriatic arthritis    How long can you sit comfortably? 30 minutes then I have to get up and move around    How long can you stand comfortably? standing doesn't bother me at all    How long can you walk comfortably? I walk for 30 minutes a day and I can tolerate it.    Patient Stated Goals "I want to be able to run, and I want to be able to sit down on the floor with my legs crossed"    Currently in Pain? Yes    Pain Score 7     Pain Location Knee    Pain Orientation Left    Pain Descriptors / Indicators Aching    Pain Type Chronic pain    Pain Onset More  than a month ago    Pain Frequency Constant              [facility] PT Assessment - 01/31/21 0001      Observation/Other Assessments   Focus on Therapeutic Outcomes (FOTO)  49% function      AROM   Left Knee Extension --   lacking 15 degrees   Left Knee Flexion 90      PROM   Overall PROM Comments bony end feel    Left Knee Extension lacking 15    Left Knee Flexion 90      Strength   Right Hip Flexion 5/5    Right Hip ABduction 4/5    Left Hip Flexion 5/5    Left Hip Extension --    Left Hip ABduction 4/5    Right Knee Flexion 5/5    Right Knee Extension 5/5    Left Knee Flexion 4+/5    Left Knee Extension 4+/5   in available ROM   Right Ankle Dorsiflexion 5/5    Right Ankle  Plantar Flexion 5/5    Left Ankle Dorsiflexion 5/5    Left Ankle Plantar Flexion 5/5                         [facility] Adult PT Treatment/Exercise - 01/31/21 0001      Ambulation/Gait   Ambulation/Gait Yes    Ambulation/Gait Assistance 6: Modified independent (Device/Increase time)    Ambulation Distance (Feet) 180 Feet    Assistive device Straight cane    Gait Pattern Step-through pattern;Shuffle    Ambulation Surface Level;Indoor    Stairs Yes   3 trials ascending and descending stairs 4 inch and 6 inch   Stairs Assistance 6: Modified independent (Device/Increase time)    Stair Management Technique Other (comment);Step to pattern   1 hand rail   Gait Comments cues for appropriate cane position for stair negotiation and to allow for contact during gait cycle as she has tendency to sometimes place cane on floor and sometimes keep it up      Posture/Postural Control   Posture Comments genu valgum LLE, foot ER      Self-Care   Other Self-Care Comments  see patient education      Knee/Hip Exercises: Seated   Long Arc Quad Limitations 2 x 10 bilateral    Marching Limitations 2 x 10 green band                  PT Education - 01/31/21 1624    Education Details D/C education. FOTO results. Reviewed and updated HEP. Recommended patient f/u with physician and discuss other bracing as her Reed Breech bracing does not fit properly due to her valgus alignment. Modalities for pain control.    Person(s) Educated Patient    Methods Explanation;Demonstration;Verbal cues    Comprehension Verbalized understanding;Returned demonstration;Verbal cues required            PT Short Term Goals - 01/01/21 1545      PT SHORT TERM GOAL #1   Title ind with initial HEP    Time 4    Period Weeks    Status Achieved    Target Date 12/10/20             PT Long Term Goals - 01/31/21 1552      PT LONG TERM GOAL #1   Title decreased pain in left knee by >= 75% with sitting and  walking.  Baseline pain severity remains unchanged    Time 8    Period Weeks    Status Not Met      PT LONG TERM GOAL #2   Title Improved left knee flexion to 5-110 deg or greater to normalize functional mobility.    Baseline see flowsheet    Time 8    Period Weeks    Status Not Met      PT LONG TERM GOAL #3   Title pt able to demo improved BLE strength to allow reciprocal gait on stairs.    Baseline see MMT in flowsheet;reciprocal pattern ascending but not descending    Time 8    Period Weeks    Status On-going      PT LONG TERM GOAL #4   Title Independent in HEP     Time 8    Period Weeks    Status Achieved                 Plan - 01/31/21 1619    Clinical Impression Statement Patient has attended 11 PT session with limited overall functional progress and has not experienced a lasting reduction in her Lt knee pain since initiating care. Her Lt knee ROM remains relatively unchanged since beginning PT and she has made modest improvements in her LE strength. She has very limited A/PROM in the Lt knee with a bony end feel and significant valgus deformity that is impacting her overall functional mobility. Due to lack of overall progress patient is appropriate for discharge at this time with recommendation to f/u with physician to discuss other measures to reduce her pain and improve her function.    Personal Factors and Comorbidities Comorbidity 3+;Past/Current Experience    Comorbidities Breast CA, DM, psoriatic arthritis    Examination-Activity Limitations Sit;Stairs    Stability/Clinical Decision Making Stable/Uncomplicated    Rehab Potential Excellent    PT Frequency 2x / week    PT Duration 4 weeks    PT Treatment/Interventions ADLs/Self Care Home Management;Iontophoresis 57m/ml Dexamethasone;Stair training;Therapeutic activities;Therapeutic exercise;Neuromuscular re-education;Manual techniques;Patient/family education;Dry needling;Taping    PT Next Visit Plan --     PT Home Exercise Plan [id number]    Consulted and Agree with Plan of Care Patient           Patient will benefit from skilled therapeutic intervention in order to improve the following deficits and impairments:  Abnormal gait,Decreased range of motion,Pain,Impaired flexibility,Decreased strength,Postural dysfunction  Visit Diagnosis: Stiffness of left knee, not elsewhere classified  Chronic pain of left knee     Problem List Patient Active Problem List   Diagnosis Date Noted  . Mild neurocognitive disorder, unclear etiology 01/04/2021  . Tinea corporis   . 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 Acute on chronic diastolic CHF (congestive heart failure) 10/04/2017  . 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 Rheumatoid arthritis 08/14/2016  . Psoriatic arthritis ([facility] 07/31/2015  . Vitamin D deficiency 04/13/2015  . 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 Bilateral lower extremity edema 09/15/2014  . Type II diabetes mellitus with manifestations 08/04/2014  . Peripheral neuropathy due to chemotherapy ([facility] 08/04/2014  . Fatigue due to treatment 08/04/2014  . Breast cancer of upper-outer quadrant of right female breast 03/03/2014   PHYSICAL THERAPY DISCHARGE SUMMARY  Visits from Start of Care: 11  Current functional level related to goals / functional outcomes: See above   Remaining deficits: See above    Education / Equipment: See above   Plan: Patient agrees to discharge.  Patient goals  were partially met. Patient is being discharged due to lack of progress.  ?????        Gwendolyn Grant, PT, DPT, ATC 01/31/21 5:08 PM  [facility] [address] [place], [place], [address] Phone: [phone]   Fax:  [phone]  Name: Brandy Jackson MRN: [id number] Date of Birth: 06/11/1951

## 2021-11-14 ENCOUNTER — Observation Stay (HOSPITAL_COMMUNITY)
Admission: EM | Admit: 2021-11-14 | Discharge: 2021-11-15 | Disposition: A | Discharge status: Home / Self Care | Payer: No Typology Code available for payment source | Attending: Internal Medicine | Admitting: Internal Medicine

## 2021-11-14 ENCOUNTER — Emergency Department (HOSPITAL_COMMUNITY): Payer: No Typology Code available for payment source

## 2021-11-14 ENCOUNTER — Encounter (HOSPITAL_COMMUNITY): Payer: Self-pay | Admitting: Emergency Medicine

## 2021-11-14 DIAGNOSIS — J1282 Pneumonia due to coronavirus disease 2019: Secondary | ICD-10-CM | POA: Diagnosis present

## 2021-11-14 DIAGNOSIS — Z87891 Personal history of nicotine dependence: Secondary | ICD-10-CM | POA: Insufficient documentation

## 2021-11-14 DIAGNOSIS — J189 Pneumonia, unspecified organism: Secondary | ICD-10-CM | POA: Diagnosis not present

## 2021-11-14 DIAGNOSIS — U071 COVID-19: Secondary | ICD-10-CM | POA: Diagnosis not present

## 2021-11-14 DIAGNOSIS — E1159 Type 2 diabetes mellitus with other circulatory complications: Secondary | ICD-10-CM | POA: Diagnosis present

## 2021-11-14 DIAGNOSIS — Z853 Personal history of malignant neoplasm of breast: Secondary | ICD-10-CM | POA: Insufficient documentation

## 2021-11-14 DIAGNOSIS — G3184 Mild cognitive impairment, so stated: Secondary | ICD-10-CM | POA: Diagnosis present

## 2021-11-14 DIAGNOSIS — Z79899 Other long term (current) drug therapy: Secondary | ICD-10-CM | POA: Insufficient documentation

## 2021-11-14 DIAGNOSIS — I110 Hypertensive heart disease with heart failure: Secondary | ICD-10-CM | POA: Diagnosis not present

## 2021-11-14 DIAGNOSIS — I5033 Acute on chronic diastolic (congestive) heart failure: Secondary | ICD-10-CM | POA: Diagnosis not present

## 2021-11-14 DIAGNOSIS — Z7984 Long term (current) use of oral hypoglycemic drugs: Secondary | ICD-10-CM | POA: Diagnosis not present

## 2021-11-14 DIAGNOSIS — E785 Hyperlipidemia, unspecified: Secondary | ICD-10-CM | POA: Diagnosis present

## 2021-11-14 DIAGNOSIS — E118 Type 2 diabetes mellitus with unspecified complications: Secondary | ICD-10-CM | POA: Diagnosis present

## 2021-11-14 DIAGNOSIS — I5032 Chronic diastolic (congestive) heart failure: Secondary | ICD-10-CM | POA: Diagnosis present

## 2021-11-14 DIAGNOSIS — E1169 Type 2 diabetes mellitus with other specified complication: Secondary | ICD-10-CM | POA: Diagnosis present

## 2021-11-14 DIAGNOSIS — G9341 Metabolic encephalopathy: Secondary | ICD-10-CM

## 2021-11-14 DIAGNOSIS — M069 Rheumatoid arthritis, unspecified: Secondary | ICD-10-CM | POA: Diagnosis present

## 2021-11-14 DIAGNOSIS — E119 Type 2 diabetes mellitus without complications: Secondary | ICD-10-CM | POA: Insufficient documentation

## 2021-11-14 DIAGNOSIS — R4182 Altered mental status, unspecified: Secondary | ICD-10-CM | POA: Diagnosis present

## 2021-11-14 LAB — CBC
HCT: 38.4 % (ref 36.0–46.0)
Hemoglobin: 12.7 g/dL (ref 12.0–15.0)
MCH: 28.9 pg (ref 26.0–34.0)
MCHC: 33.1 g/dL (ref 30.0–36.0)
MCV: 87.5 fL (ref 80.0–100.0)
Platelets: 136 10*3/uL — ABNORMAL LOW (ref 150–400)
RBC: 4.39 MIL/uL (ref 3.87–5.11)
RDW: 15.9 % — ABNORMAL HIGH (ref 11.5–15.5)
WBC: 4.8 10*3/uL (ref 4.0–10.5)
nRBC: 0 % (ref 0.0–0.2)

## 2021-11-14 LAB — COMPREHENSIVE METABOLIC PANEL
ALT: 45 U/L — ABNORMAL HIGH (ref 0–44)
AST: 48 U/L — ABNORMAL HIGH (ref 15–41)
Albumin: 3.6 g/dL (ref 3.5–5.0)
Alkaline Phosphatase: 99 U/L (ref 38–126)
Anion gap: 7 (ref 5–15)
BUN: 9 mg/dL (ref 8–23)
CO2: 22 mmol/L (ref 22–32)
Calcium: 8.7 mg/dL — ABNORMAL LOW (ref 8.9–10.3)
Chloride: 106 mmol/L (ref 98–111)
Creatinine, Ser: 0.62 mg/dL (ref 0.44–1.00)
GFR, Estimated: >60 mL/min (ref >60)
Glucose, Bld: 215 mg/dL — ABNORMAL HIGH (ref 70–99)
Potassium: 3.5 mmol/L (ref 3.5–5.1)
Sodium: 135 mmol/L (ref 135–145)
Total Bilirubin: 0.6 mg/dL (ref 0.3–1.2)
Total Protein: 7 g/dL (ref 6.5–8.1)

## 2021-11-14 LAB — CBG MONITORING, ED: Glucose-Capillary: 210 mg/dL — ABNORMAL HIGH (ref 70–99)

## 2021-11-14 LAB — URINALYSIS, ROUTINE W REFLEX MICROSCOPIC
Bilirubin Urine: NEGATIVE
Glucose, UA: NEGATIVE mg/dL
Hgb urine dipstick: NEGATIVE
Ketones, ur: NEGATIVE mg/dL
Leukocytes,Ua: NEGATIVE
Nitrite: NEGATIVE
Protein, ur: NEGATIVE mg/dL
Specific Gravity, Urine: 1.015 (ref 1.005–1.030)
pH: 8.5 — ABNORMAL HIGH (ref 5.0–8.0)

## 2021-11-14 LAB — LACTIC ACID, PLASMA
Lactic Acid, Venous: 1.6 mmol/L (ref 0.5–1.9)
Lactic Acid, Venous: 1.3 mmol/L (ref 0.5–1.9)

## 2021-11-14 LAB — LIPASE, BLOOD: Lipase: 26 U/L (ref 11–51)

## 2021-11-14 LAB — RESP PANEL BY RT-PCR (FLU A&B, COVID) ARPGX2
Influenza A by PCR: NEGATIVE
Influenza B by PCR: NEGATIVE
SARS Coronavirus 2 by RT PCR: POSITIVE — AB

## 2021-11-14 LAB — BRAIN NATRIURETIC PEPTIDE: B Natriuretic Peptide: 210 pg/mL — ABNORMAL HIGH (ref 0.0–100.0)

## 2021-11-14 MED ORDER — NIRMATRELVIR/RITONAVIR (PAXLOVID)TABLET
3.0000 | ORAL_TABLET | Freq: Two times a day (BID) | ORAL | Status: DC
  Administered 2021-11-15 (×2): 3 via ORAL
  Filled 2021-11-14: qty 30

## 2021-11-14 MED ORDER — LOSARTAN POTASSIUM 25 MG PO TABS
25.0000 mg | ORAL_TABLET | Freq: Every day | ORAL | Status: DC
  Administered 2021-11-15 (×2): 25 mg via ORAL
  Filled 2021-11-14 (×2): qty 1

## 2021-11-14 MED ORDER — ZINC SULFATE 220 (50 ZN) MG PO CAPS
220.0000 mg | ORAL_CAPSULE | Freq: Every day | ORAL | Status: DC
  Administered 2021-11-15: 220 mg via ORAL
  Filled 2021-11-14: qty 1

## 2021-11-14 MED ORDER — ONDANSETRON HCL 4 MG/2ML IJ SOLN: 4.0000 mg | Freq: Four times a day (QID) | INTRAMUSCULAR | Status: DC | PRN

## 2021-11-14 MED ORDER — ALBUTEROL SULFATE HFA 108 (90 BASE) MCG/ACT IN AERS: 1.0000 | INHALATION_SPRAY | RESPIRATORY_TRACT | Status: DC | PRN

## 2021-11-14 MED ORDER — GUAIFENESIN-DM 100-10 MG/5ML PO SYRP: 10.0000 mL | ORAL_SOLUTION | ORAL | Status: DC | PRN

## 2021-11-14 MED ORDER — LACTATED RINGERS IV SOLN: INTRAVENOUS | Status: AC

## 2021-11-14 MED ORDER — DEXAMETHASONE 4 MG PO TABS
6.0000 mg | ORAL_TABLET | Freq: Every day | ORAL | Status: DC
  Administered 2021-11-15: 6 mg via ORAL
  Filled 2021-11-14: qty 1

## 2021-11-14 MED ORDER — INSULIN ASPART 100 UNIT/ML IJ SOLN
0.0000 [IU] | Freq: Every day | INTRAMUSCULAR | Status: DC
Start: 2021-11-14 — End: 2021-11-15
  Administered 2021-11-15: 2 [IU] via SUBCUTANEOUS
  Filled 2021-11-14: qty 0.05

## 2021-11-14 MED ORDER — SODIUM CHLORIDE 0.9 % IV SOLN
1.0000 g | Freq: Once | INTRAVENOUS | Status: AC
  Administered 2021-11-14: 1 g via INTRAVENOUS
  Filled 2021-11-14: qty 10

## 2021-11-14 MED ORDER — ASCORBIC ACID 500 MG PO TABS
500.0000 mg | ORAL_TABLET | Freq: Every day | ORAL | Status: DC
  Administered 2021-11-15: 500 mg via ORAL
  Filled 2021-11-14: qty 1

## 2021-11-14 MED ORDER — ENOXAPARIN SODIUM 40 MG/0.4ML IJ SOSY
40.0000 mg | PREFILLED_SYRINGE | Freq: Every day | INTRAMUSCULAR | Status: DC
  Administered 2021-11-15: 40 mg via SUBCUTANEOUS
  Filled 2021-11-14: qty 0.4

## 2021-11-14 MED ORDER — METOPROLOL TARTRATE 12.5 MG HALF TABLET
12.5000 mg | ORAL_TABLET | Freq: Two times a day (BID) | ORAL | Status: DC
  Administered 2021-11-15 (×2): 12.5 mg via ORAL
  Filled 2021-11-14 (×2): qty 1

## 2021-11-14 MED ORDER — SENNOSIDES-DOCUSATE SODIUM 8.6-50 MG PO TABS: 1.0000 | ORAL_TABLET | Freq: Every evening | ORAL | Status: DC | PRN

## 2021-11-14 MED ORDER — ONDANSETRON HCL 4 MG PO TABS: 4.0000 mg | ORAL_TABLET | Freq: Four times a day (QID) | ORAL | Status: DC | PRN

## 2021-11-14 MED ORDER — HYDROCOD POLST-CPM POLST ER 10-8 MG/5ML PO SUER: 5.0000 mL | Freq: Two times a day (BID) | ORAL | Status: DC | PRN

## 2021-11-14 MED ORDER — INSULIN ASPART 100 UNIT/ML IJ SOLN
0.0000 [IU] | Freq: Three times a day (TID) | INTRAMUSCULAR | Status: DC
  Administered 2021-11-15: 5 [IU] via SUBCUTANEOUS
  Administered 2021-11-15: 11 [IU] via SUBCUTANEOUS
  Filled 2021-11-14: qty 0.15

## 2021-11-14 MED ORDER — SODIUM CHLORIDE 0.9 % IV BOLUS
1000.0000 mL | Freq: Once | INTRAVENOUS | Status: AC
  Administered 2021-11-14: 1000 mL via INTRAVENOUS

## 2021-11-14 MED ORDER — SODIUM CHLORIDE 0.9 % IV SOLN
500.0000 mg | Freq: Once | INTRAVENOUS | Status: AC
  Administered 2021-11-14: 500 mg via INTRAVENOUS
  Filled 2021-11-14: qty 5

## 2021-11-14 MED ORDER — ACETAMINOPHEN 325 MG PO TABS
650.0000 mg | ORAL_TABLET | Freq: Four times a day (QID) | ORAL | Status: DC | PRN
  Administered 2021-11-15: 650 mg via ORAL
  Filled 2021-11-14: qty 2

## 2021-11-14 NOTE — ED Notes (Signed)
Food given 

## 2021-11-14 NOTE — ED Triage Notes (Signed)
Per EMS-coming from home-states "altered" per family-temp 100.2-states she is normally oriented x3-frequent urination for a couple of days-no history of dementia-100 mg of Tylenol given in route

## 2021-11-14 NOTE — H&P (Signed)
History and Physical    RICQUEL FOULK QAS:[id number] DOB: 1951/01/15 DOA: 11/14/2021  PCP: Ricke Hey, MD  Patient coming from: Home via EMS  I have personally briefly reviewed patient's old medical records in [place]  Chief Complaint: Altered mental status, fever  HPI: Brandy Jackson is a 70 y.o. female with medical history significant for chronic diastolic CHF (last EF 79-89%), T2DM, HTN, HLD, rheumatoid arthritis on methotrexate, history of breast cancer, and cognitive impairment who presented to the ED for evaluation of altered mental status and fever.  History is supplemented by husband at bedside.  Patient's husband states that normally she is able to converse fluently, dress her self, takes her own medications, usually ambulates on her own but sometimes needs a walker when her rheumatoid arthritis flares up.  She has had some memory issues.  Morning of 12/8 she had significant change in baseline mental status.  She was very fatigued and remained in bed most of the day.  She would not get up from bed and was less interactive than usual.  She was not having any specific complaints.  Due to change in her baseline status she was brought to the ED for further evaluation.  Patient confused at time of admission.  She knows her husband at bedside but not able to give much history with regards to current situation.  She is not oriented to place or year.  She complains of chronic left knee pain but otherwise has no specific complaints.  She is coughing intermittently during exam.  Husband does note that she intermittently appears to have alternating hot and cold spells.  Cough is new today.  ED Course:  Initial vitals showed BP 153/77, pulse 107, RR 18, temp 101.5 F, SPO2 95% on room air.  Labs show WBCs 4.8, hemoglobin 12.7, platelets 136,000, sodium 135, potassium 3.5, bicarb 22, BUN 9, creatinine 0.62, serum glucose 215, AST 48, ALT 45, alk phos 99, total bilirubin 0.6,  lipase 26, lactic acid 1.6 g and 1.3.  SARS-CoV-2 PCR is positive.  Influenza PCR is negative.  Blood cultures collected and pending.  Urinalysis negative for UTI.  Portable chest x-ray shows low lung volumes with patchy perihilar atelectasis versus infiltrates and possible central pulmonary vascular congestion.  Patient was given IV ceftriaxone and azithromycin, 1 L normal saline.  The hospitalist service was consulted to admit for further evaluation and management.  Review of Systems:  Unable to obtain full review of systems due to altered mental status.  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 Cornett, MD;  Location: [facility];  Service: General;  Laterality: N/A;   RE-EXCISION OF BREAST LUMPECTOMY Right 06/06/2014   Procedure: RE-EXCISION OF BREAST LUMPECTOMY;  Surgeon: Marcello Moores A. Cornett, MD;   Location: [facility];  Service: General;  Laterality: Right;    Social History:  reports that she quit smoking about 35 years ago. She smoked an average of 1 pack per day. She has never used smokeless tobacco. She reports that she does not drink alcohol and does not use drugs.  Allergies  Allergen Reactions   Sulfa Antibiotics Rash    Family History  Problem Relation Age of Onset   Diabetes Mother    Memory loss Mother        possible undiagnosed Alzheimer's disease   Heart disease Father    Congestive Heart Failure Sister    Congestive Heart Failure Sister    Healthy Son    Healthy Son    Healthy Son      Prior to Admission medications   Medication Sig Start Date End Date Taking? Authorizing Provider  calcipotriene (DOVONOX) 0.005 % cream Apply 1 application topically 2 (two) times daily as needed (itching from psoriasis).     [provider]  cholecalciferol (VITAMIN D) 1000 UNITS tablet Take 1,000 Units by mouth daily.    [provider]  clobetasol (TEMOVATE) 0.05 % external solution Apply 1 application topically 2 (two) times daily. Patient taking differently: Apply 1 application topically 2 (two) times daily as needed (itching from psoriasis). 01/10/16   Boelter, Genelle Gather, NP  Cyanocobalamin (VITAMIN B-12 PO) Take 1 tablet by mouth daily.    [provider]  cyclobenzaprine (FLEXERIL) 10 MG tablet Take 10 mg by mouth every 8 (eight) hours as needed for muscle spasms.    [provider]  diclofenac sodium (VOLTAREN) 1 % GEL Apply 1 application topically 2 (two) times daily as needed (neck pain).    [provider]  ferrous sulfate 324 (65 Fe) MG TBEC Take 1 tablet by mouth daily.    [provider]  folic acid (FOLVITE) 1 MG tablet Take 1 mg by mouth See admin instructions. Take 1 tablet (1 mg) by mouth every day except Saturday (do not take on the same day as methotrexate)    [provider]   HYDROcodone-homatropine (HYCODAN) 5-1.5 MG/5ML syrup hydrocodone-homatropine 5 mg-1.5 mg/5 mL oral syrup Patient not taking: Reported on 11/26/2020    [provider]  ketoconazole (NIZORAL) 2 % cream Apply 1 application topically daily. Patient taking differently: Apply 1 application topically daily as needed (rash under breasts). 01/10/16   Boelter, Genelle Gather, NP  losartan (COZAAR) 25 MG tablet Take 25 mg by mouth daily.    [provider]  metFORMIN (GLUCOPHAGE) 500 MG tablet Take 500 mg by mouth daily.    [provider]  methotrexate (RHEUMATREX) 2.5 MG tablet Take 10 mg by mouth 2 (two) times a week. Caution:Chemotherapy. Protect from light  Saturday    [provider]  METHOTREXATE PO Take 4 tablets by mouth See admin instructions. Take 4 tablets by mouth Saturday morning and take 4 tablets Saturday night    [provider]  metoprolol tartrate (LOPRESSOR) 25 MG tablet Take 0.5 tablets (12.5 mg total) by mouth 2 (two) times daily. 10/05/17   Eulogio Bear  U, DO  naproxen (NAPROSYN) 500 MG tablet Take 500 mg by mouth daily as needed (pain).    [provider]  Omega-3 Fatty Acids (FISH OIL) 1000 MG CAPS Take 1,000 mg by mouth daily.    [provider]  simvastatin (ZOCOR) 20 MG tablet Take 10 mg by mouth daily.    [provider]    Physical Exam: Vitals:   11/14/21 2000 11/14/21 2030 11/14/21 2100 11/14/21 2130  BP: (!) 145/63 (!) 142/104 134/85 (!) 158/78  Pulse: 88 91 96 99  Resp: (!) 23 20 (!) 21 (!) 33  Temp:      TempSrc:      SpO2: 97% 97% 98% 97%  Weight:      Height:       Exam limited due to encephalopathy and cooperation. Constitutional: Obese woman resting in bed with head elevated, appears very fatigued but in NAD, calm, comfortable Eyes: PERRL, lids and conjunctivae normal ENMT: Mucous membranes are dry. Posterior pharynx clear of any exudate or lesions.Normal dentition.  Neck: normal, supple,  no masses. Respiratory: clear to auscultation anteriorly. Normal respiratory effort. No accessory muscle use.  Cardiovascular: Regular rate and rhythm, no murmurs / rubs / gallops. No extremity edema. 2+ pedal pulses. Abdomen: no tenderness, no masses palpated. No hepatosplenomegaly. Bowel sounds positive.  Musculoskeletal: no clubbing / cyanosis. No joint deformity upper and lower extremities. No contractures. Normal muscle tone.  Skin: no rashes, lesions, ulcers. No induration Neurologic: CN 2-12 grossly intact. Sensation intact.  Generally weak, exam limited due to cooperation, no apparent focal weakness. Psychiatric: Somnolent but easily awakens.  Awake and alert.  Oriented to self and knows her husband at bedside otherwise not oriented to situation, place, or year.  Labs on Admission: I have personally reviewed following labs and imaging studies  CBC: Recent Labs  Lab 11/14/21 1620  WBC 4.8  HGB 12.7  HCT 38.4  MCV 87.5  PLT 136*   Basic Metabolic Panel: Recent Labs  Lab 11/14/21 1620  NA 135  K 3.5  CL 106  CO2 22  GLUCOSE 215*  BUN 9  CREATININE 0.62  CALCIUM 8.7*   GFR: Estimated Creatinine Clearance: 73.4 mL/min (by C-G formula based on SCr of 0.62 mg/dL). Liver Function Tests: Recent Labs  Lab 11/14/21 1620  AST 48*  ALT 45*  ALKPHOS 99  BILITOT 0.6  PROT 7.0  ALBUMIN 3.6   Recent Labs  Lab 11/14/21 1620  LIPASE 26   No results for input(s): AMMONIA in the last 168 hours. Coagulation Profile: No results for input(s): INR, PROTIME in the last 168 hours. Cardiac Enzymes: No results for input(s): CKTOTAL, CKMB, CKMBINDEX, TROPONINI in the last 168 hours. BNP (last 3 results) No results for input(s): PROBNP in the last 8760 hours. HbA1C: No results for input(s): HGBA1C in the last 72 hours. CBG: Recent Labs  Lab 11/14/21 1553  GLUCAP 210*   Lipid Profile: No results for input(s): CHOL, HDL, LDLCALC, TRIG, CHOLHDL, LDLDIRECT in the last 72  hours. Thyroid Function Tests: No results for input(s): TSH, T4TOTAL, FREET4, T3FREE, THYROIDAB in the last 72 hours. Anemia Panel: No results for input(s): VITAMINB12, FOLATE, FERRITIN, TIBC, IRON, RETICCTPCT in the last 72 hours. Urine analysis:    Component Value Date/Time   COLORURINE YELLOW 11/14/2021 1645   APPEARANCEUR CLEAR 11/14/2021 1645   LABSPEC 1.015 11/14/2021 1645   LABSPEC 1.015 09/01/2014 1334   PHURINE 8.5 (H) 11/14/2021 1645   GLUCOSEU NEGATIVE 11/14/2021 1645  GLUCOSEU Negative 09/01/2014 1334   HGBUR NEGATIVE 11/14/2021 [place] 11/14/2021 1645   BILIRUBINUR Negative 09/01/2014 1334   KETONESUR NEGATIVE 11/14/2021 1645   PROTEINUR NEGATIVE 11/14/2021 1645   UROBILINOGEN 0.2 09/01/2014 1334   NITRITE NEGATIVE 11/14/2021 1645   LEUKOCYTESUR NEGATIVE 11/14/2021 1645   LEUKOCYTESUR Negative 09/01/2014 1334    Radiological Exams on Admission: DG Chest Portable 1 View  Result Date: 11/14/2021 CLINICAL DATA:  Tachy, AMS, frequent urination EXAM: PORTABLE CHEST - 1 VIEW COMPARISON:  10/04/2017 FINDINGS: Low lung volumes. Crowding of bronchovascular structures. Patchy perihilar atelectasis or infiltrates and possible central pulmonary vascular congestion. Heart size and mediastinal contours are within normal limits. No effusion.  No pneumothorax. Visualized bones unremarkable.  Surgical clips in the right breast. IMPRESSION: Low lung volumes with perihilar atelectasis or infiltrates versus pulmonary vascular congestion Electronically Signed   By: Lucrezia Europe M.D.   On: 11/14/2021 18:35    EKG: Not performed.  Assessment/Plan Principal Problem:   Pneumonia due to COVID-19 virus Active Problems:   Type II diabetes mellitus with manifestations   Chronic diastolic CHF (congestive heart failure) (HCC)   Rheumatoid arthritis   Mild neurocognitive disorder, unclear etiology   Acute metabolic encephalopathy   Hypertension associated with diabetes  ([place])   Hyperlipidemia associated with type 2 diabetes mellitus (HCC)   Brandy Jackson is a 70 y.o. female with medical history significant for chronic diastolic CHF (last EF 48-25%), T2DM, HTN, HLD, rheumatoid arthritis on methotrexate, history of breast cancer, and cognitive impairment who is admitted with COVID-19 pneumonia and acute metabolic encephalopathy.  COVID-19 pneumonia: SARS-CoV-2 PCR positive 11/14/2021. Portable chest x-ray shows patchy perihilar infiltrates.  T-max 101.5 F at time of admission.  Has new cough but is otherwise saturating well on room air.  Does not appear to have received any prior COVID-19 vaccinations. -Start on Paxlovid -Will need to hold home simvastatin now and resume 5 days after completion of Paxlovid due to drug interaction per pharmacy -Start oral Decadron 6 mg daily -Start IV fluid hydration overnight as she clinically appears volume depleted -Continue supplemental oxygen as needed, incentive spirometer/flutter valve -Mobilize with PT/OT  Acute metabolic encephalopathy: Suspect secondary to COVID-19 infection however will obtain CT head without contrast given degree of change in mental status from her baseline per husband report.  Chronic diastolic CHF: Appears volume depleted on admission.  Continue IV fluid hydration as above, monitor strict I/O's and daily weights.  Does not appear to be requiring diuretics as an outpatient.  Type 2 diabetes: Hold home metformin and place on SSI.  Hypertension: Resume home losartan and metoprolol.  Hyperlipidemia: Simvastatin on hold while on Paxlovid.  Can resume 5 days after completion of Paxlovid course per pharmacy.  Rheumatoid arthritis: Hold methotrexate.  Cognitive impairment: Previous documentation from neuropsychological evaluation beginning of the year suggestive of mild cognitive impairment.  Per husband, mental status significantly changed from her baseline in setting of COVID-19 infection.   Continue management as above, delirium precautions.  DVT prophylaxis: Lovenox Code Status: Full code, confirmed on admission Family Communication: Discussed with patient's husband at bedside Disposition Plan: From home, dispo pending clinical progress Consults called: None Level of care: Med-Surg Admission status:  Status is: Observation  The patient remains OBS appropriate and will d/c before 2 midnights.  Zada Finders MD [facility]  If 7PM-7AM, please contact night-coverage [email or web address]  11/14/2021, 9:42 PM

## 2021-11-14 NOTE — ED Notes (Addendum)
Pt care taken, ceftriaxone administered. Wants food.

## 2021-11-14 NOTE — ED Provider Notes (Addendum)
[facility] DEPT Provider Note   CSN: [id number] Arrival date & time: 11/14/21  1521     History Chief Complaint  Patient presents with   Altered Mental Status   Dysuria    Brandy Jackson is a 70 y.o. female. Level 5 caveat due to altered mental status.  Altered Mental Status Dysuria Patient presents with altered mental status.  Report is been more confused.  Reportedly oriented x3 but more confused now.  Reportedly has had some frequent urination.  Febrile for EMS and family.  Patient states she feels good.  Unable to provide too much history. 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   Psoriatic arthritis 07/31/2015   Radiation 10/18/14-12/07/14   Invasive ductal carcinoma   Rectal bleed 06/30/2014   Rheumatoid arthritis 08/14/2016   Tinea corporis    Tremor    primarily left hand   Type II diabetes mellitus with manifestations 08/04/2014   Vitamin D deficiency 04/13/2015   Wears glasses    Wears partial dentures    bottom    Patient Active Problem List   Diagnosis Date Noted   Community acquired pneumonia 11/14/2021   Pneumonia due to COVID-19 virus 11/14/2021   Mild neurocognitive disorder, unclear etiology 01/04/2021   Tinea corporis    Hypertrophic cardiomyopathy 10/04/2017   Acute on chronic diastolic CHF (congestive heart failure) 10/04/2017   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   Rheumatoid arthritis  08/14/2016   Psoriatic arthritis ([place]) 07/31/2015   Vitamin D deficiency 04/13/2015   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 Cornett, MD;  Location: [place];  Service: General;  Laterality: N/A;   RE-EXCISION OF BREAST LUMPECTOMY Right 06/06/2014   Procedure: RE-EXCISION OF BREAST LUMPECTOMY;  Surgeon: Marcello Moores A. Cornett, MD;  Location: [place];  Service: General;  Laterality: Right;     OB History   No obstetric history on file.    Obstetric Comments  Menarche 65 First live  Birth age 38. Has 3 boys LMP age 8 No hormonal  Replacement therapy         Family History  Problem Relation Age of Onset   Diabetes Mother    Memory loss Mother        possible undiagnosed Alzheimer's disease   Heart disease Father    Congestive Heart Failure Sister    Congestive Heart Failure Sister    Healthy Son    Healthy Son    Healthy Son     Social History   Tobacco Use   Smoking  status: Former    Packs/day: 1.00    Types: Cigarettes    Quit date: 03/08/1986    Years since quitting: 35.7   Smokeless tobacco: Never  Vaping Use   Vaping Use: Never used  Substance Use Topics   Alcohol use: No   Drug use: No    Home Medications Prior to Admission medications   Medication Sig Start Date End Date Taking? Authorizing Provider  calcipotriene (DOVONOX) 0.005 % cream Apply 1 application topically 2 (two) times daily as needed (itching from psoriasis).     [provider]   cholecalciferol (VITAMIN D) 1000 UNITS tablet Take 1,000 Units by mouth daily.    [provider]  clobetasol (TEMOVATE) 0.05 % external solution Apply 1 application topically 2 (two) times daily. Patient taking differently: Apply 1 application topically 2 (two) times daily as needed (itching from psoriasis). 01/10/16   Boelter, Genelle Gather, NP  Cyanocobalamin (VITAMIN B-12 PO) Take 1 tablet by mouth daily.    [provider]  cyclobenzaprine (FLEXERIL) 10 MG tablet Take 10 mg by mouth every 8 (eight) hours as needed for muscle spasms.    [provider]  diclofenac sodium (VOLTAREN) 1 % GEL Apply 1 application topically 2 (two) times daily as needed (neck pain).    [provider]  ferrous sulfate 324 (65 Fe) MG TBEC Take 1 tablet by mouth daily.    [provider]  folic acid (FOLVITE) 1 MG tablet Take 1 mg by mouth See admin instructions. Take 1 tablet (1 mg) by mouth every day except Saturday (do not take on the same day as methotrexate)    [provider]  HYDROcodone-homatropine (HYCODAN) 5-1.5 MG/5ML syrup hydrocodone-homatropine 5 mg-1.5 mg/5 mL oral syrup Patient not taking: Reported on 11/26/2020    [provider]  ketoconazole (NIZORAL) 2 % cream Apply 1 application topically daily. Patient taking differently: Apply 1 application topically daily as needed (rash under breasts). 01/10/16   Boelter, Genelle Gather, NP  losartan (COZAAR) 25 MG tablet Take 25 mg by mouth daily.    [provider]  metFORMIN (GLUCOPHAGE) 500 MG tablet Take 500 mg by mouth daily.    [provider]  methotrexate (RHEUMATREX) 2.5 MG tablet Take 10 mg by mouth 2 (two) times a week. Caution:Chemotherapy. Protect from light  Saturday    [provider]  METHOTREXATE PO Take 4 tablets by mouth See admin instructions. Take 4 tablets by mouth Saturday morning and take 4 tablets Saturday night    [provider]  metoprolol  tartrate (LOPRESSOR) 25 MG tablet Take 0.5 tablets (12.5 mg total) by mouth 2 (two) times daily. 10/05/17   Geradine Girt, DO  naproxen (NAPROSYN) 500 MG tablet Take 500 mg by mouth daily as needed (pain).    [provider]  Omega-3 Fatty Acids (FISH OIL) 1000 MG CAPS Take 1,000 mg by mouth daily.    [provider]  simvastatin (ZOCOR) 20 MG tablet Take 10 mg by mouth daily.    [provider]    Allergies    Sulfa antibiotics  Review of Systems   Review of Systems  Unable to perform ROS: Mental status change  Genitourinary:  Positive for dysuria.   Physical Exam Updated Vital Signs BP (!) 145/63   Pulse 88   Temp 100.1 F (37.8 C) (Oral)   Resp (!) 23   Ht _0  (1.702 m)   Wt 85.3 kg   SpO2 97%  BMI 29.44 kg/m   Physical Exam Vitals and nursing note reviewed.  Constitutional:      Appearance: Normal appearance.  HENT:     Head: Normocephalic.  Eyes:     Pupils: Pupils are equal, round, and reactive to light.  Cardiovascular:     Rate and Rhythm: Regular rhythm. Tachycardia present.  Pulmonary:     Breath sounds: No wheezing or rhonchi.  Abdominal:     Tenderness: There is no abdominal tenderness.  Musculoskeletal:        General: No tenderness.     Right lower leg: Edema present.     Left lower leg: Edema present.  Skin:    General: Skin is warm.     Capillary Refill: Capillary refill takes less than 2 seconds.  Neurological:     Mental Status: She is alert.     Comments: Awake and pleasant but some confusion.  Moving all extremities.    ED Results / Procedures / Treatments   Labs (all labs ordered are listed, but only abnormal results are displayed) Labs Reviewed  RESP PANEL BY RT-PCR (FLU A&B, COVID) ARPGX2 - Abnormal; Notable for the following components:      Result Value   SARS Coronavirus 2 by RT PCR POSITIVE (*)    All other components within normal limits  COMPREHENSIVE METABOLIC PANEL - Abnormal; Notable for the  following components:   Glucose, Bld 215 (*)    Calcium 8.7 (*)    AST 48 (*)    ALT 45 (*)    All other components within normal limits  CBC - Abnormal; Notable for the following components:   RDW 15.9 (*)    Platelets 136 (*)    All other components within normal limits  URINALYSIS, ROUTINE W REFLEX MICROSCOPIC - Abnormal; Notable for the following components:   pH 8.5 (*)    All other components within normal limits  CBG MONITORING, ED - Abnormal; Notable for the following components:   Glucose-Capillary 210 (*)    All other components within normal limits  CULTURE, BLOOD (ROUTINE X 2)  CULTURE, BLOOD (ROUTINE X 2)  LIPASE, BLOOD  LACTIC ACID, PLASMA  LACTIC ACID, PLASMA    EKG None  Radiology DG Chest Portable 1 View  Result Date: 11/14/2021 CLINICAL DATA:  Tachy, AMS, frequent urination EXAM: PORTABLE CHEST - 1 VIEW COMPARISON:  10/04/2017 FINDINGS: Low lung volumes. Crowding of bronchovascular structures. Patchy perihilar atelectasis or infiltrates and possible central pulmonary vascular congestion. Heart size and mediastinal contours are within normal limits. No effusion.  No pneumothorax. Visualized bones unremarkable.  Surgical clips in the right breast. IMPRESSION: Low lung volumes with perihilar atelectasis or infiltrates versus pulmonary vascular congestion Electronically Signed   By: Lucrezia Europe M.D.   On: 11/14/2021 18:35    Procedures Procedures   Medications Ordered in ED Medications  sodium chloride 0.9 % bolus 1,000 mL (0 mLs Intravenous Stopped 11/14/21 2103)  cefTRIAXone (ROCEPHIN) 1 g in sodium chloride 0.9 % 100 mL IVPB (0 g Intravenous Stopped 11/14/21 1931)  azithromycin (ZITHROMAX) 500 mg in sodium chloride 0.9 % 250 mL IVPB (500 mg Intravenous New Bag/Given 11/14/21 2011)    ED Course  I have reviewed the triage vital signs and the nursing notes.  Pertinent labs & imaging results that were available during my care of the patient were reviewed by me  and considered in my medical decision making (see chart for details).    MDM Rules/Calculators/A&P  Patient with mental status change and fever.  Tachycardia is improved with treatment.  Baseline may have a little dementia but dresses himself and does very well.  Chest x-ray showed possible pneumonia.  White count reassuring.  Normal lactic acid.  Urine does not show infection.  Will treat with antibiotics.  COVID and flu testing still pending.  Will discuss with hospitalist since the patient's PCP is the [place].  Patient has been admitted to hospitalist.  COVID test did come back positive.  Patient has not been vaccinated has not had COVID previously Final Clinical Impression(s) / ED Diagnoses Final diagnoses:  Community acquired pneumonia, unspecified laterality  Metabolic encephalopathy  [id number]    Rx / DC Orders ED Discharge Orders     None        Davonna Belling, MD 11/14/21 2013    Davonna Belling, MD 11/14/21 2123

## 2021-11-14 NOTE — ED Notes (Signed)
EDP at the bedside to evaluate.  

## 2021-11-14 NOTE — ED Notes (Signed)
Pt tachy and febrile. Moved from [place] to [facility] 22 after d/w Charge Nurse Hanley Seamen, RN.

## 2021-11-15 ENCOUNTER — Observation Stay (HOSPITAL_COMMUNITY): Payer: No Typology Code available for payment source

## 2021-11-15 ENCOUNTER — Other Ambulatory Visit: Payer: Self-pay

## 2021-11-15 DIAGNOSIS — U071 COVID-19: Secondary | ICD-10-CM | POA: Diagnosis not present

## 2021-11-15 DIAGNOSIS — G9341 Metabolic encephalopathy: Secondary | ICD-10-CM | POA: Diagnosis not present

## 2021-11-15 DIAGNOSIS — J1282 Pneumonia due to coronavirus disease 2019: Secondary | ICD-10-CM | POA: Diagnosis not present

## 2021-11-15 LAB — GLUCOSE, CAPILLARY
Glucose-Capillary: 216 mg/dL — ABNORMAL HIGH (ref 70–99)
Glucose-Capillary: 303 mg/dL — ABNORMAL HIGH (ref 70–99)

## 2021-11-15 LAB — COMPREHENSIVE METABOLIC PANEL
ALT: 52 U/L — ABNORMAL HIGH (ref 0–44)
AST: 57 U/L — ABNORMAL HIGH (ref 15–41)
Albumin: 3.6 g/dL (ref 3.5–5.0)
Alkaline Phosphatase: 98 U/L (ref 38–126)
Anion gap: 7 (ref 5–15)
BUN: 9 mg/dL (ref 8–23)
CO2: 23 mmol/L (ref 22–32)
Calcium: 8.9 mg/dL (ref 8.9–10.3)
Chloride: 110 mmol/L (ref 98–111)
Creatinine, Ser: 0.61 mg/dL (ref 0.44–1.00)
GFR, Estimated: >60 mL/min (ref >60)
Glucose, Bld: 211 mg/dL — ABNORMAL HIGH (ref 70–99)
Potassium: 3.6 mmol/L (ref 3.5–5.1)
Sodium: 140 mmol/L (ref 135–145)
Total Bilirubin: 0.6 mg/dL (ref 0.3–1.2)
Total Protein: 7.2 g/dL (ref 6.5–8.1)

## 2021-11-15 LAB — CBC WITH DIFFERENTIAL/PLATELET
Abs Immature Granulocytes: 0.01 10*3/uL (ref 0.00–0.07)
Basophils Absolute: 0 10*3/uL (ref 0.0–0.1)
Basophils Relative: 1 %
Eosinophils Absolute: 0 10*3/uL (ref 0.0–0.5)
Eosinophils Relative: 0 %
HCT: 39.4 % (ref 36.0–46.0)
Hemoglobin: 13 g/dL (ref 12.0–15.0)
Immature Granulocytes: 0 %
Lymphocytes Relative: 19 %
Lymphs Abs: 0.7 10*3/uL (ref 0.7–4.0)
MCH: 29.3 pg (ref 26.0–34.0)
MCHC: 33 g/dL (ref 30.0–36.0)
MCV: 88.9 fL (ref 80.0–100.0)
Monocytes Absolute: 0.1 10*3/uL (ref 0.1–1.0)
Monocytes Relative: 3 %
Neutro Abs: 3 10*3/uL (ref 1.7–7.7)
Neutrophils Relative %: 77 %
Platelets: 112 10*3/uL — ABNORMAL LOW (ref 150–400)
RBC: 4.43 MIL/uL (ref 3.87–5.11)
RDW: 16.1 % — ABNORMAL HIGH (ref 11.5–15.5)
WBC: 3.9 10*3/uL — ABNORMAL LOW (ref 4.0–10.5)
nRBC: 0 % (ref 0.0–0.2)

## 2021-11-15 LAB — LACTATE DEHYDROGENASE: LDH: 168 U/L (ref 98–192)

## 2021-11-15 LAB — HEMOGLOBIN A1C
Hgb A1c MFr Bld: 8.2 % — ABNORMAL HIGH (ref 4.8–5.6)
Mean Plasma Glucose: 188.64 mg/dL

## 2021-11-15 LAB — PROCALCITONIN: Procalcitonin: 0.12 ng/mL

## 2021-11-15 LAB — FERRITIN: Ferritin: 98 ng/mL (ref 11–307)

## 2021-11-15 LAB — PHOSPHORUS: Phosphorus: 3.9 mg/dL (ref 2.5–4.6)

## 2021-11-15 LAB — HIV ANTIBODY (ROUTINE TESTING W REFLEX): HIV Screen 4th Generation wRfx: NONREACTIVE

## 2021-11-15 LAB — CBG MONITORING, ED: Glucose-Capillary: 208 mg/dL — ABNORMAL HIGH (ref 70–99)

## 2021-11-15 LAB — D-DIMER, QUANTITATIVE: D-Dimer, Quant: 1.13 ug/mL-FEU — ABNORMAL HIGH (ref 0.00–0.50)

## 2021-11-15 LAB — MAGNESIUM: Magnesium: 1.8 mg/dL (ref 1.7–2.4)

## 2021-11-15 LAB — C-REACTIVE PROTEIN: CRP: 0.6 mg/dL (ref <1.0)

## 2021-11-15 MED ORDER — NIRMATRELVIR/RITONAVIR (PAXLOVID)TABLET: 3.0000 | ORAL_TABLET | Freq: Two times a day (BID) | ORAL | 0 refills | Status: AC

## 2021-11-15 NOTE — Evaluation (Signed)
Occupational Therapy Evaluation Patient Details Name: Brandy Jackson MRN: [id number] DOB: [birth year] Today's Date: 11/15/2021   History of Present Illness Pt admitted with AMS/fever and dx with COVID.  Pt with hx of CHF, RA, DM, hypertrophic cardiomyopathy and "memory issues".   Clinical Impression   Brandy Jackson demonstrates ability to perform ADLs and functional mobility without loss of balance or desaturation. Patient appears to be at baseline. She has no OT needs at this time.       Recommendations for follow up therapy are one component of a multi-disciplinary discharge planning process, led by the attending physician.  Recommendations may be updated based on patient status, additional functional criteria and insurance authorization.   Follow Up Recommendations  No OT follow up    Assistance Recommended at Discharge None  Functional Status Assessment  Patient has not had a recent decline in their functional status  Equipment Recommendations  None recommended by OT    Recommendations for Other Services       Precautions / Restrictions Precautions Precautions: Fall Restrictions Weight Bearing Restrictions: No      Mobility Bed Mobility Overal bed mobility: Modified Independent                  Transfers Overall transfer level: Modified independent Equipment used: None               General transfer comment: Up from commode and EOB and into recliner unassisted      Balance Overall balance assessment: Mild deficits observed, not formally tested                                         ADL either performed or assessed with clinical judgement   ADL Overall ADL's : At baseline                                             Vision   Vision Assessment?: No apparent visual deficits     Perception     Praxis      Pertinent Vitals/Pain Pain Assessment: No/denies pain     Hand Dominance      Extremity/Trunk Assessment Upper Extremity Assessment Upper Extremity Assessment: Overall WFL for tasks assessed   Lower Extremity Assessment Lower Extremity Assessment: Overall WFL for tasks assessed   Cervical / Trunk Assessment Cervical / Trunk Assessment: Normal   Communication Communication Communication: No difficulties   Cognition Arousal/Alertness: Awake/alert Behavior During Therapy: WFL for tasks assessed/performed Overall Cognitive Status: History of cognitive impairments - at baseline                                       General Comments       Exercises     Shoulder Instructions      Home Living Family/patient expects to be discharged to:: Private residence Living Arrangements: Spouse/significant other Available Help at Discharge: Family;Available 24 hours/day Type of Home: House Home Access: Stairs to enter [employer] of Steps: 7 Entrance Stairs-Rails: Right Home Layout: One level               Home Equipment: Conservation officer, nature (2 wheels)  Prior Functioning/Environment Prior Level of Function : Independent/Modified Independent             Mobility Comments: used RW as needed dependent on RA          OT Problem List:        OT Treatment/Interventions:      OT Goals(Current goals can be found in the care plan section) Acute Rehab OT Goals OT Goal Formulation: All assessment and education complete, DC therapy  OT Frequency:     Barriers to D/C:            Co-evaluation PT/OT/SLP Co-Evaluation/Treatment: Yes (co -eval)            AM-PAC OT "6 Clicks" Daily Activity     Outcome Measure Help from another person eating meals?: None Help from another person taking care of personal grooming?: None Help from another person toileting, which includes using toliet, bedpan, or urinal?: None Help from another person bathing (including washing, rinsing, drying)?: None Help from another person to  put on and taking off regular upper body clothing?: None Help from another person to put on and taking off regular lower body clothing?: None 6 Click Score: 24   End of Session Equipment Utilized During Treatment: Gait belt Nurse Communication: Mobility status  Activity Tolerance: Patient tolerated treatment well Patient left: in chair;with family/visitor present  OT Visit Diagnosis: Muscle weakness (generalized) (M62.81)                Time: 9476-5465 OT Time Calculation (min): 12 min Charges:  OT General Charges $OT Visit: 1 Visit OT Evaluation $OT Eval Low Complexity: 1 Low  Yaneisy Wenz, OTR/L [place]  Office [phone] Pager: [phone]   Lenward Chancellor 11/15/2021, 1:24 PM

## 2021-11-15 NOTE — Discharge Instructions (Signed)

## 2021-11-15 NOTE — Progress Notes (Signed)
  Transition of Care [facility]) Screening Note   Patient Details  Name: Brandy Jackson Date of Birth: 02-Dec-1951   Transition of Care [facility]) CM/SW Contact:    Lennart Pall, LCSW Phone Number: 11/15/2021, 10:44 AM    Transition of Care Department [facility]) has reviewed patient and no TOC needs have been identified at this time. We will continue to monitor patient advancement through interdisciplinary progression rounds. If new patient transition needs arise, please place a TOC consult.   Markayla Reichart, LCSW

## 2021-11-15 NOTE — Progress Notes (Signed)
Patient was up with PT in the hall and had O2 sats of 98%.

## 2021-11-15 NOTE — Evaluation (Signed)
Physical Therapy Evaluation Patient Details Name: Brandy Jackson MRN: [id number] DOB: [birth year] Today's Date: 11/15/2021  History of Present Illness  Pt admitted with AMS/fever and dx with COVID.  Pt with hx of CHF, RA, DM, hypertrophic cardiomyopathy and "memory issues".  Clinical Impression  Pt admitted as above and presenting with functional mobility limitations 2* mild ambulatory balance deficits.  This date pt demonstrating ability to perform basic mobility tasks Modified IND and ambulate unassisted in hall sans AD and including back stepping and side stepping with no LOB.  Pt and spouse stating pt appears to be back to baseline.  Pt maintained SaO2 at 93% or higher on RA throughout session.     Recommendations for follow up therapy are one component of a multi-disciplinary discharge planning process, led by the attending physician.  Recommendations may be updated based on patient status, additional functional criteria and insurance authorization.  Follow Up Recommendations No PT follow up    Assistance Recommended at Discharge Intermittent Supervision/Assistance  Functional Status Assessment Patient has had a recent decline in their functional status and demonstrates the ability to make significant improvements in function in a reasonable and predictable amount of time.  Equipment Recommendations  None recommended by PT    Recommendations for Other Services       Precautions / Restrictions Precautions Precautions: Fall Restrictions Weight Bearing Restrictions: No      Mobility  Bed Mobility Overal bed mobility: Modified Independent                  Transfers Overall transfer level: Modified independent Equipment used: None               General transfer comment: Up from commode and EOB and into recliner unassisted    Ambulation/Gait Ambulation/Gait assistance: Supervision;Independent Gait Distance (Feet): 200 Feet Assistive device: None Gait  Pattern/deviations: Step-through pattern;Decreased step length - right;Decreased step length - left;Shuffle;Wide base of support Gait velocity: decr     General Gait Details: Slowed shuffling gait with mild general instability compensated with widened BOS.  No LOB with ambulation fwd, bkwd or side-step  Stairs            Wheelchair Mobility    Modified Rankin (Stroke Patients Only)       Balance Overall balance assessment: Mild deficits observed, not formally tested                                           Pertinent Vitals/Pain Pain Assessment: No/denies pain    Home Living Family/patient expects to be discharged to:: Private residence Living Arrangements: Spouse/significant other Available Help at Discharge: Family;Available 24 hours/day Type of Home: House Home Access: Stairs to enter Entrance Stairs-Rails: Right Entrance Stairs-Number of Steps: 7   Home Layout: One level Home Equipment: Conservation officer, nature (2 wheels)      Prior Function Prior Level of Function : Independent/Modified Independent             Mobility Comments: used RW as needed dependent on RA       Hand Dominance        Extremity/Trunk Assessment   Upper Extremity Assessment Upper Extremity Assessment: Overall WFL for tasks assessed    Lower Extremity Assessment Lower Extremity Assessment: Overall WFL for tasks assessed    Cervical / Trunk Assessment Cervical / Trunk Assessment: Normal  Communication   Communication: No difficulties  Cognition Arousal/Alertness: Awake/alert Behavior During Therapy: WFL for tasks assessed/performed Overall Cognitive Status: History of cognitive impairments - at baseline                                          General Comments      Exercises     Assessment/Plan    PT Assessment Patient needs continued PT services  PT Problem List Decreased balance       PT Treatment Interventions Gait  training;Functional mobility training    PT Goals (Current goals can be found in the Care Plan section)  Acute Rehab PT Goals Patient Stated Goal: HOME PT Goal Formulation: All assessment and education complete, DC therapy    Frequency Min 1X/week   Barriers to discharge        Co-evaluation               AM-PAC PT "6 Clicks" Mobility  Outcome Measure Help needed turning from your back to your side while in a flat bed without using bedrails?: None Help needed moving from lying on your back to sitting on the side of a flat bed without using bedrails?: None Help needed moving to and from a bed to a chair (including a wheelchair)?: None Help needed standing up from a chair using your arms (e.g., wheelchair or bedside chair)?: None Help needed to walk in hospital room?: None Help needed climbing 3-5 steps with a railing? : A Little 6 Click Score: 23    End of Session Equipment Utilized During Treatment: Gait belt Activity Tolerance: Patient tolerated treatment well Patient left: in chair;with call bell/phone within reach;with family/visitor present Nurse Communication: Mobility status PT Visit Diagnosis: Unsteadiness on feet (R26.81);Difficulty in walking, not elsewhere classified (R26.2)    Time: 4739-5844 PT Time Calculation (min) (ACUTE ONLY): 21 min   Charges:   PT Evaluation $PT Eval Low Complexity: 1 Low          [place] Pager [phone] Office [phone]   Latonda Larrivee 11/15/2021, 12:47 PM

## 2021-11-15 NOTE — Hospital Course (Signed)
Brandy Jackson is a 69 year old female with PMH rheumatoid arthritis on chronic methotrexate, chronic diastolic CHF, DM II who presented to the hospital with altered mentation and fever.  Initial history was supplied by her husband present bedside.  She was noted to be grossly confused unable to carry out normal ADLs.  She then developed more fatigue and due to this was brought in for further evaluation.  There was also associated cough noted on review of systems. She underwent further work-up and was found to be positive for COVID-19.  Initial vitals in the ER were notable for temperature 101.5.  Oxygen saturations 95% on room air. CXR showed poor inspiratory effort but no gross abnormalities consistent with interstitial infiltrates, focal infiltrates, or volume overload. She was initially given Rocephin, azithromycin, IV fluids.  She was then started on Paxlovid and steroids.  The morning following admission her mentation had significantly improved back to baseline.  CT head performed on admission was also unremarkable, only showing mild changes consistent with her age. She ambulated in the hall without any hypoxia.  Due to her rapid progression/improvement, she was felt to be stable for discharge home and to complete her course of Paxlovid there.  She did not meet any further criteria for continuing steroids and this was not continued at discharge.

## 2021-11-15 NOTE — Progress Notes (Signed)
Patient and spouse were given discharge instructions, and all questions were answered. Patient was stable for discharge and was taken to the main exit by wheelchair. 

## 2021-11-15 NOTE — Discharge Summary (Signed)
Physician Discharge Summary   Patient name: Brandy Jackson  Admit date:     11/14/2021  Discharge date: 11/15/2021  Discharge Physician: Dwyane Dee   PCP: Ricke Hey, MD   Recommendations at discharge:  Continue routine medical care  Discharge Diagnoses Principal Problem:   Pneumonia due to COVID-19 virus Active Problems:   Type II diabetes mellitus with manifestations   Chronic diastolic CHF (congestive heart failure) (HCC)   Rheumatoid arthritis   Mild neurocognitive disorder, unclear etiology   Hypertension associated with diabetes ([place])   Hyperlipidemia associated with type 2 diabetes mellitus ([place])   Resolved Diagnoses Resolved Problems:   Acute metabolic encephalopathy   Hospital Course   Brandy Jackson is a 70 year old female with PMH rheumatoid arthritis on chronic methotrexate, chronic diastolic CHF, DM II who presented to the hospital with altered mentation and fever.  Initial history was supplied by her husband present bedside.  She was noted to be grossly confused unable to carry out normal ADLs.  She then developed more fatigue and due to this was brought in for further evaluation.  There was also associated cough noted on review of systems. She underwent further work-up and was found to be positive for COVID-19.  Initial vitals in the ER were notable for temperature 101.5.  Oxygen saturations 95% on room air. CXR showed poor inspiratory effort but no gross abnormalities consistent with interstitial infiltrates, focal infiltrates, or volume overload. She was initially given Rocephin, azithromycin, IV fluids.  She was then started on Paxlovid and steroids.  The morning following admission her mentation had significantly improved back to baseline.  CT head performed on admission was also unremarkable, only showing mild changes consistent with her age. She ambulated in the hall without any hypoxia.  Due to her rapid progression/improvement, she was felt to be  stable for discharge home and to complete her course of Paxlovid there.  She did not meet any further criteria for continuing steroids and this was not continued at discharge.    Condition at discharge: stable  Exam Physical Exam Constitutional:      General: She is not in acute distress.    Appearance: Normal appearance. She is not ill-appearing.  HENT:     Head: Normocephalic and atraumatic.  Eyes:     Extraocular Movements: Extraocular movements intact.  Cardiovascular:     Rate and Rhythm: Normal rate and regular rhythm.     Heart sounds: Normal heart sounds.  Pulmonary:     Effort: Pulmonary effort is normal.     Breath sounds: Normal breath sounds.     Comments: Subtle bibasilar rhonchi most consistent with atelectasis Abdominal:     General: Bowel sounds are normal. There is no distension.     Palpations: Abdomen is soft.     Tenderness: There is no abdominal tenderness.  Musculoskeletal:        General: Normal range of motion.     Cervical back: Normal range of motion and neck supple.  Skin:    General: Skin is warm and dry.  Neurological:     General: No focal deficit present.     Mental Status: She is alert.  Psychiatric:        Mood and Affect: Mood normal.        Behavior: Behavior normal.     Disposition: Home  Discharge time: greater than 30 minutes.   Allergies as of 11/15/2021       Reactions   Sulfa Antibiotics Rash  Medication List     STOP taking these medications    simvastatin 20 MG tablet Commonly known as: ZOCOR       TAKE these medications    calcipotriene 0.005 % cream Commonly known as: DOVONOX Apply 1 application topically 2 (two) times daily as needed (itching from psoriasis).   cholecalciferol 1000 units tablet Commonly known as: VITAMIN D Take 1,000 Units by mouth daily.   cyclobenzaprine 10 MG tablet Commonly known as: FLEXERIL Take 10 mg by mouth every 8 (eight) hours as needed for muscle spasms.    diclofenac sodium 1 % Gel Commonly known as: VOLTAREN Apply 1 application topically 2 (two) times daily as needed (neck pain).   ferrous sulfate 324 (65 Fe) MG Tbec Take 1 tablet by mouth daily.   Fish Oil 1000 MG Caps Take 1,000 mg by mouth daily.   folic acid 1 MG tablet Commonly known as: FOLVITE Take 1 mg by mouth See admin instructions. Take 1 tablet (1 mg) by mouth every day except Saturday (do not take on the same day as methotrexate)   losartan 25 MG tablet Commonly known as: COZAAR Take 25 mg by mouth daily.   metFORMIN 500 MG tablet Commonly known as: GLUCOPHAGE Take 1,000 mg by mouth daily.   methotrexate 2.5 MG tablet Commonly known as: RHEUMATREX Take 15 mg by mouth See admin instructions. Take 15mg  (6 tablets) by mouth every week on Saturday  Caution:Chemotherapy. Protect from light   metoprolol tartrate 25 MG tablet Commonly known as: LOPRESSOR Take 0.5 tablets (12.5 mg total) by mouth 2 (two) times daily.   naproxen 500 MG tablet Commonly known as: NAPROSYN Take 500 mg by mouth daily as needed (pain).   nirmatrelvir/ritonavir EUA 20 x 150 MG & 10 x 100MG  Tabs Commonly known as: PAXLOVID Take 3 tablets by mouth 2 (two) times daily for 5 days. Patient GFR is >60. Take nirmatrelvir (150 mg) two tablets twice daily for 5 days and ritonavir (100 mg) one tablet twice daily for 5 days.   VITAMIN B-12 PO Take 1 tablet by mouth daily.        CT HEAD WO CONTRAST (5MM)  Result Date: 11/15/2021 CLINICAL DATA:  Altered mental status, COVID positive EXAM: CT HEAD WITHOUT CONTRAST TECHNIQUE: Contiguous axial images were obtained from the base of the skull through the vertex without intravenous contrast. COMPARISON:  None. FINDINGS: Brain: Normal anatomic configuration. Parenchymal volume loss is commensurate with the patient's age. Mild periventricular white matter changes are present likely reflecting the sequela of small vessel ischemia. No abnormal intra or  extra-axial mass lesion or fluid collection. No abnormal mass effect or midline shift. No evidence of acute intracranial hemorrhage or infarct. Ventricular size is normal. Cerebellum unremarkable. Vascular: No asymmetric hyperdense vasculature at the skull base. Skull: Intact Sinuses/Orbits: Paranasal sinuses are clear. Orbits are unremarkable. Other: Mastoid air cells and middle ear cavities are clear. IMPRESSION: No acute intracranial abnormality.  Mild senescent change. Electronically Signed   By: Fidela Salisbury M.D.   On: 11/15/2021 00:42   DG Chest Portable 1 View  Result Date: 11/14/2021 CLINICAL DATA:  Tachy, AMS, frequent urination EXAM: PORTABLE CHEST - 1 VIEW COMPARISON:  10/04/2017 FINDINGS: Low lung volumes. Crowding of bronchovascular structures. Patchy perihilar atelectasis or infiltrates and possible central pulmonary vascular congestion. Heart size and mediastinal contours are within normal limits. No effusion.  No pneumothorax. Visualized bones unremarkable.  Surgical clips in the right breast. IMPRESSION: Low lung volumes with perihilar atelectasis or  infiltrates versus pulmonary vascular congestion Electronically Signed   By: Lucrezia Europe M.D.   On: 11/14/2021 18:35   Results for orders placed or performed during the hospital encounter of 11/14/21  Culture, blood (routine x 2)     Status: None (Preliminary result)   Collection Time: 11/14/21  4:09 PM   Specimen: BLOOD  Result Value Ref Range Status   Specimen Description   Final    BLOOD RIGHT ANTECUBITAL Performed at [facility] [address], [place], [place] [address]    Special Requests   Final    BOTTLES DRAWN AEROBIC AND ANAEROBIC Blood Culture results may not be optimal due to an inadequate volume of blood received in culture bottles Performed at [facility] [address], [place], [place] [address]    Culture   Final    NO GROWTH < 24 HOURS Performed at [facility] [address], [place], [place] [address]    Report Status PENDING  Incomplete  Culture, blood (routine x 2)     Status: None (Preliminary result)   Collection Time: 11/14/21  4:21 PM   Specimen: BLOOD  Result Value Ref Range Status   Specimen Description   Final    BLOOD LEFT ANTECUBITAL Performed at [facility] [address], [place], [place] [address]    Special Requests   Final    BOTTLES DRAWN AEROBIC AND ANAEROBIC Blood Culture results may not be optimal due to an inadequate volume of blood received in culture bottles Performed at [facility] [address] [place], [place] [address]    Culture   Final    NO GROWTH < 24 HOURS Performed at [facility] [address], [place], [place] [address]    Report Status PENDING  Incomplete  Resp Panel by RT-PCR (Flu A&B, Covid) Nasopharyngeal Swab     Status: Abnormal   Collection Time: 11/14/21  6:40 PM   Specimen: Nasopharyngeal Swab; Nasopharyngeal(NP) swabs in vial transport medium  Result Value Ref Range Status   SARS Coronavirus 2 by RT PCR POSITIVE (A) NEGATIVE Final    Comment: CRITICAL RESULT CALLED TO, READ BACK BY AND VERIFIED WITH:  Robyn Haber MD 11/14/21 @ 2117 VS (NOTE) SARS-CoV-2 target nucleic acids are DETECTED.  The SARS-CoV-2 RNA is generally detectable in upper respiratory specimens during the acute phase of infection. Positive results are indicative of the presence of the identified virus, but do not rule out bacterial infection or co-infection with other pathogens not detected by the test. Clinical correlation with patient history and other diagnostic information is necessary to determine patient infection status. The expected result is Negative.  Fact Sheet for Patients: EntrepreneurPulse.com.au  Fact Sheet for Healthcare Providers: IncredibleEmployment.be  This test is not yet approved or cleared by the [place] FDA  and  has been authorized for detection and/or diagnosis of SARS-CoV-2 by FDA under an Emergency Use Authorization (EUA).  This EUA will remain in effect (meaning this te st can be used) for the duration of  the COVID-19 declaration under Section 564(b)(1) of the Act, 21 U.S.C. section 360bbb-3(b)(1), unless the authorization is terminated or revoked sooner.     Influenza A by PCR NEGATIVE NEGATIVE Final   Influenza B by PCR NEGATIVE NEGATIVE Final    Comment: (NOTE) The Xpert Xpress SARS-CoV-2/FLU/RSV plus assay is intended as an aid in the diagnosis of influenza from Nasopharyngeal swab specimens and should not be  used as a sole basis for treatment. Nasal washings and aspirates are unacceptable for Xpert Xpress SARS-CoV-2/FLU/RSV testing.  Fact Sheet for Patients: EntrepreneurPulse.com.au  Fact Sheet for Healthcare Providers: IncredibleEmployment.be  This test is not yet approved or cleared by the [place] FDA and has been authorized for detection and/or diagnosis of SARS-CoV-2 by FDA under an Emergency Use Authorization (EUA). This EUA will remain in effect (meaning this test can be used) for the duration of the COVID-19 declaration under Section 564(b)(1) of the Act, 21 U.S.C. section 360bbb-3(b)(1), unless the authorization is terminated or revoked.  Performed at [facility], [place] [address] [place], [place] [address]     Signed:  Dwyane Dee MD.  [facility] 11/15/2021, 12:42 PM

## 2021-11-19 LAB — CULTURE, BLOOD (ROUTINE X 2)
Culture: NO GROWTH
Culture: NO GROWTH

## 2022-03-26 ENCOUNTER — Encounter: Payer: Self-pay | Admitting: Neurology

## 2022-04-03 NOTE — Progress Notes (Deleted)
Assessment/Plan:   ***1.  Mental status change  -I read her [place] records clearly.  There is nothing really that sounds concerning for seizure.  Patient had mental status change when she had COVID/COVID-pneumonia and was on prednisone and paxlovid.  Her blood sugars were quite high.  The patient had a lot of reasons to have confusion and disorientation.  My suspicion for seizure is actually quite low.  We will nonetheless do EEG.  I do not see a reason to do ambulatory EEG if that is negative.     Subjective:   Brandy Jackson was seen today in follow up.  I have not seen the patient since 2021.  For ***.  My previous records as well as any outside records available were reviewed prior to todays visit.  At that point in time, she was referred for memory testing and she only met criteria for MCI, although clinical suspicion was for more than this.  She was evaluated for tremor at that point in time.  On the day I saw her, I saw no evidence of tremor at all.  She was referred back again for repeat evaluation of both tremor and memory change as well as an incident of possible seizure.  Referral comes from [facility] from January (we tried to get the patient in for evaluation earlier, but patient was difficult to reach).  Records indicate that patient had an incident in January in which she was laying down and husband tried to wake her up and she was difficult to awaken.  He called EMS, but by the time EMS came, the patient was coming back to her baseline.  Her blood sugar was in the 300s.  She was also positive for COVID with COVID-pneumonia.  She was started on insulin, steroids, antibiotics and paxlovid.  She was released from the emergency room and told to follow-up with primary care.  Apparently over that weekend she was having episodes of weakness and confusion.  She was having elevated blood sugars in the 300s.   PREVIOUS MEDICATIONS: {Parkinson's RX:18200}  CURRENT MEDICATIONS:   Outpatient Encounter Medications as of 04/07/2022  Medication Sig   calcipotriene (DOVONOX) 0.005 % cream Apply 1 application topically 2 (two) times daily as needed (itching from psoriasis).    cholecalciferol (VITAMIN D) 1000 UNITS tablet Take 1,000 Units by mouth daily.   Cyanocobalamin (VITAMIN B-12 PO) Take 1 tablet by mouth daily.   cyclobenzaprine (FLEXERIL) 10 MG tablet Take 10 mg by mouth every 8 (eight) hours as needed for muscle spasms.   diclofenac sodium (VOLTAREN) 1 % GEL Apply 1 application topically 2 (two) times daily as needed (neck pain).   ferrous sulfate 324 (65 Fe) MG TBEC Take 1 tablet by mouth daily.   folic acid (FOLVITE) 1 MG tablet Take 1 mg by mouth See admin instructions. Take 1 tablet (1 mg) by mouth every day except Saturday (do not take on the same day as methotrexate)   losartan (COZAAR) 25 MG tablet Take 25 mg by mouth daily.   metFORMIN (GLUCOPHAGE) 500 MG tablet Take 1,000 mg by mouth daily.   methotrexate (RHEUMATREX) 2.5 MG tablet Take 15 mg by mouth See admin instructions. Take 15mg  (6 tablets) by mouth every week on Saturday  Caution:Chemotherapy. Protect from light   metoprolol tartrate (LOPRESSOR) 25 MG tablet Take 0.5 tablets (12.5 mg total) by mouth 2 (two) times daily.   naproxen (NAPROSYN) 500 MG tablet Take 500 mg by mouth daily as needed (  pain).   Omega-3 Fatty Acids (FISH OIL) 1000 MG CAPS Take 1,000 mg by mouth daily.   No facility-administered encounter medications on file as of 04/07/2022.     Objective:   PHYSICAL EXAMINATION:    VITALS:  There were no vitals filed for this visit.  GEN:  The patient appears stated age and is in NAD. HEENT:  Normocephalic, atraumatic.  The mucous membranes are moist. The superficial temporal arteries are without ropiness or tenderness. CV:  RRR Lungs:  CTAB Neck/HEME:  There are no carotid bruits bilaterally.  Neurological examination:  Orientation: The patient is alert and oriented x3 and actually  writes out for me the month, date and year.  However, she does have difficulty focusing and staying on topic at times.   Cranial nerves: There is good facial symmetry.  Extraocular muscles are intact. The visual fields are full to confrontational testing. The speech is fluent and clear. Soft palate rises symmetrically and there is no tongue deviation. Hearing is intact to conversational tone. Sensation: Sensation is intact to light touch throughout (facial, trunk, extremities). Vibration is intact at the bilateral big toe. There is no extinction with double simultaneous stimulation.  Motor: Strength is 5/5 in the bilateral upper and lower extremities.   Shoulder shrug is equal and symmetric.  There is no pronator drift. Deep tendon reflexes: Deep tendon reflexes are 0-1/4 at the bilateral biceps, triceps, brachioradialis, patella and achilles. Plantar responses are downgoing bilaterally.    Movement examination: Tone: There is normal tone in the bilateral upper extremities.  The tone in the lower extremities is normal.  Abnormal movements: no rest tremor.  No postural tremor Coordination:  There is no decremation with RAM's, with any form of RAMS, including alternating supination and pronation of the forearm, hand opening and closing, finger taps, heel taps and toe taps. Gait and Station: The patient has mild difficulty arising out of a deep-seated chair without the use of the hands. The patient's stride length is decreased but she has significant valgus deformities of the knees.      ***Total time spent on today's visit was *** minutes, including both face-to-face time and nonface-to-face time.  Time included that spent on review of records (prior notes available to me/labs/imaging if pertinent), discussing treatment and goals, answering patient's questions and coordinating care.  Cc:  Ricke Hey, MD

## 2022-04-07 ENCOUNTER — Ambulatory Visit: Payer: Non-veteran care | Admitting: Neurology

## 2022-04-07 DIAGNOSIS — Z029 Encounter for administrative examinations, unspecified: Secondary | ICD-10-CM

## 2022-04-07 NOTE — Progress Notes (Deleted)
Assessment/Plan:   ***1.  Mental status change  -I read her [place] records clearly.  There is nothing really that sounds concerning for seizure.  Patient had mental status change when she had COVID/COVID-pneumonia and was on prednisone and paxlovid.  Her blood sugars were quite high.  The patient had a lot of reasons to have confusion and disorientation.  My suspicion for seizure is actually quite low.  We will nonetheless do EEG.  I do not see a reason to do ambulatory EEG if that is negative.     Subjective:   Brandy Jackson was seen today in follow up.  I have not seen the patient since 2021.  For ***.  My previous records as well as any outside records available were reviewed prior to todays visit.  At that point in time, she was referred for memory testing and she only met criteria for MCI, although clinical suspicion was for more than this.  She was evaluated for tremor at that point in time.  On the day I saw her, I saw no evidence of tremor at all.  She was referred back again for repeat evaluation of both tremor and memory change as well as an incident of possible seizure.  Referral comes from [facility] from January (we tried to get the patient in for evaluation earlier, but patient was difficult to reach).  Records indicate that patient had an incident in January in which she was laying down and husband tried to wake her up and she was difficult to awaken.  He called EMS, but by the time EMS came, the patient was coming back to her baseline.  Her blood sugar was in the 300s.  She was also positive for COVID with COVID-pneumonia.  She was started on insulin, steroids, antibiotics and paxlovid.  She was released from the emergency room and told to follow-up with primary care.  Apparently over that weekend she was having episodes of weakness and confusion.  She was having elevated blood sugars in the 300s.   PREVIOUS MEDICATIONS: {Parkinson's RX:18200}  CURRENT MEDICATIONS:   Outpatient Encounter Medications as of 04/21/2022  Medication Sig   calcipotriene (DOVONOX) 0.005 % cream Apply 1 application topically 2 (two) times daily as needed (itching from psoriasis).    cholecalciferol (VITAMIN D) 1000 UNITS tablet Take 1,000 Units by mouth daily.   Cyanocobalamin (VITAMIN B-12 PO) Take 1 tablet by mouth daily.   cyclobenzaprine (FLEXERIL) 10 MG tablet Take 10 mg by mouth every 8 (eight) hours as needed for muscle spasms.   diclofenac sodium (VOLTAREN) 1 % GEL Apply 1 application topically 2 (two) times daily as needed (neck pain).   ferrous sulfate 324 (65 Fe) MG TBEC Take 1 tablet by mouth daily.   folic acid (FOLVITE) 1 MG tablet Take 1 mg by mouth See admin instructions. Take 1 tablet (1 mg) by mouth every day except Saturday (do not take on the same day as methotrexate)   losartan (COZAAR) 25 MG tablet Take 25 mg by mouth daily.   metFORMIN (GLUCOPHAGE) 500 MG tablet Take 1,000 mg by mouth daily.   methotrexate (RHEUMATREX) 2.5 MG tablet Take 15 mg by mouth See admin instructions. Take 89m (6 tablets) by mouth every week on Saturday  Caution:Chemotherapy. Protect from light   metoprolol tartrate (LOPRESSOR) 25 MG tablet Take 0.5 tablets (12.5 mg total) by mouth 2 (two) times daily.   naproxen (NAPROSYN) 500 MG tablet Take 500 mg by mouth daily as needed (  pain).   Omega-3 Fatty Acids (FISH OIL) 1000 MG CAPS Take 1,000 mg by mouth daily.   No facility-administered encounter medications on file as of 04/21/2022.     Objective:   PHYSICAL EXAMINATION:    VITALS:  There were no vitals filed for this visit.  GEN:  The patient appears stated age and is in NAD. HEENT:  Normocephalic, atraumatic.  The mucous membranes are moist. The superficial temporal arteries are without ropiness or tenderness. CV:  RRR Lungs:  CTAB Neck/HEME:  There are no carotid bruits bilaterally.  Neurological examination:  Orientation: The patient is alert and oriented x3 and  actually writes out for me the month, date and year.  However, she does have difficulty focusing and staying on topic at times.   Cranial nerves: There is good facial symmetry.  Extraocular muscles are intact. The visual fields are full to confrontational testing. The speech is fluent and clear. Soft palate rises symmetrically and there is no tongue deviation. Hearing is intact to conversational tone. Sensation: Sensation is intact to light touch throughout (facial, trunk, extremities). Vibration is intact at the bilateral big toe. There is no extinction with double simultaneous stimulation.  Motor: Strength is 5/5 in the bilateral upper and lower extremities.   Shoulder shrug is equal and symmetric.  There is no pronator drift. Deep tendon reflexes: Deep tendon reflexes are 0-1/4 at the bilateral biceps, triceps, brachioradialis, patella and achilles. Plantar responses are downgoing bilaterally.    Movement examination: Tone: There is normal tone in the bilateral upper extremities.  The tone in the lower extremities is normal.  Abnormal movements: no rest tremor.  No postural tremor Coordination:  There is no decremation with RAM's, with any form of RAMS, including alternating supination and pronation of the forearm, hand opening and closing, finger taps, heel taps and toe taps. Gait and Station: The patient has mild difficulty arising out of a deep-seated chair without the use of the hands. The patient's stride length is decreased but she has significant valgus deformities of the knees.      ***Total time spent on today's visit was *** minutes, including both face-to-face time and nonface-to-face time.  Time included that spent on review of records (prior notes available to me/labs/imaging if pertinent), discussing treatment and goals, answering patient's questions and coordinating care.  Cc:  Ricke Hey, MD

## 2022-04-08 ENCOUNTER — Encounter: Payer: Self-pay | Admitting: Neurology

## 2022-04-21 ENCOUNTER — Ambulatory Visit: Payer: Non-veteran care | Admitting: Neurology
